# Patient Record
Sex: Female | Born: 1975 | Hispanic: Yes | Marital: Single | Smoking: Current every day smoker
Health system: Southern US, Community
[De-identification: ages and names within clinical notes are randomized; demographics above are authoritative.]

## PROBLEM LIST (undated history)

## (undated) DIAGNOSIS — I639 Cerebral infarction, unspecified: Secondary | ICD-10-CM

## (undated) DIAGNOSIS — F603 Borderline personality disorder: Secondary | ICD-10-CM

## (undated) DIAGNOSIS — F4310 Post-traumatic stress disorder, unspecified: Secondary | ICD-10-CM

## (undated) DIAGNOSIS — F1020 Alcohol dependence, uncomplicated: Secondary | ICD-10-CM

## (undated) DIAGNOSIS — F332 Major depressive disorder, recurrent severe without psychotic features: Secondary | ICD-10-CM

## (undated) DIAGNOSIS — F419 Anxiety disorder, unspecified: Secondary | ICD-10-CM

## (undated) DIAGNOSIS — F329 Major depressive disorder, single episode, unspecified: Secondary | ICD-10-CM

## (undated) DIAGNOSIS — F1120 Opioid dependence, uncomplicated: Secondary | ICD-10-CM

## (undated) DIAGNOSIS — F1320 Sedative, hypnotic or anxiolytic dependence, uncomplicated: Secondary | ICD-10-CM

## (undated) DIAGNOSIS — T71162A Asphyxiation due to hanging, intentional self-harm, initial encounter: Secondary | ICD-10-CM

## (undated) DIAGNOSIS — R569 Unspecified convulsions: Secondary | ICD-10-CM

## (undated) DIAGNOSIS — F32A Depression, unspecified: Secondary | ICD-10-CM

## (undated) HISTORY — PX: NO PAST SURGERIES: SHX2092

---

## 2012-09-20 ENCOUNTER — Emergency Department: Payer: Self-pay | Admitting: Emergency Medicine

## 2012-10-19 ENCOUNTER — Emergency Department: Payer: Self-pay | Admitting: Emergency Medicine

## 2012-10-19 LAB — COMPREHENSIVE METABOLIC PANEL
Albumin: 4.5 g/dL (ref 3.4–5.0)
Alkaline Phosphatase: 79 U/L (ref 50–136)
Calcium, Total: 9.4 mg/dL (ref 8.5–10.1)
Co2: 28 mmol/L (ref 21–32)
Creatinine: 0.78 mg/dL (ref 0.60–1.30)
Glucose: 95 mg/dL (ref 65–99)
Osmolality: 271 (ref 275–301)
SGOT(AST): 23 U/L (ref 15–37)
Sodium: 135 mmol/L — ABNORMAL LOW (ref 136–145)
Total Protein: 9 g/dL — ABNORMAL HIGH (ref 6.4–8.2)

## 2012-10-19 LAB — CBC
HCT: 46.4 % (ref 35.0–47.0)
HGB: 15.5 g/dL (ref 12.0–16.0)
MCH: 32.8 pg (ref 26.0–34.0)
RDW: 12.9 % (ref 11.5–14.5)
WBC: 8.5 10*3/uL (ref 3.6–11.0)

## 2012-10-19 LAB — URINALYSIS, COMPLETE
Glucose,UR: NEGATIVE mg/dL (ref 0–75)
Ketone: NEGATIVE
Protein: NEGATIVE
RBC,UR: <1 /HPF (ref 0–5)
Specific Gravity: 1.004 (ref 1.003–1.030)
Squamous Epithelial: 3
WBC UR: 1 /HPF (ref 0–5)

## 2012-10-19 LAB — LIPASE, BLOOD: Lipase: 138 U/L (ref 73–393)

## 2012-11-04 DIAGNOSIS — R52 Pain, unspecified: Secondary | ICD-10-CM | POA: Insufficient documentation

## 2012-11-04 DIAGNOSIS — IMO0002 Reserved for concepts with insufficient information to code with codable children: Secondary | ICD-10-CM | POA: Insufficient documentation

## 2012-12-21 ENCOUNTER — Emergency Department: Payer: Self-pay | Admitting: Emergency Medicine

## 2012-12-21 LAB — URINALYSIS, COMPLETE
Bacteria: NONE SEEN
Bilirubin,UR: NEGATIVE
Blood: NEGATIVE
Glucose,UR: NEGATIVE mg/dL (ref 0–75)
Ketone: NEGATIVE
Nitrite: NEGATIVE
Ph: 6 (ref 4.5–8.0)
Protein: NEGATIVE
RBC,UR: 1 /HPF (ref 0–5)
Squamous Epithelial: 6

## 2012-12-21 LAB — CBC
HCT: 40.4 % (ref 35.0–47.0)
HGB: 13.5 g/dL (ref 12.0–16.0)
MCH: 32.1 pg (ref 26.0–34.0)
MCV: 96 fL (ref 80–100)

## 2012-12-21 LAB — COMPREHENSIVE METABOLIC PANEL
Alkaline Phosphatase: 64 U/L (ref 50–136)
BUN: 12 mg/dL (ref 7–18)
Co2: 26 mmol/L (ref 21–32)
Creatinine: 0.59 mg/dL — ABNORMAL LOW (ref 0.60–1.30)
EGFR (African American): >60
EGFR (Non-African Amer.): >60
Glucose: 112 mg/dL — ABNORMAL HIGH (ref 65–99)
Osmolality: 269 (ref 275–301)
Potassium: 4 mmol/L (ref 3.5–5.1)
SGOT(AST): 41 U/L — ABNORMAL HIGH (ref 15–37)
SGPT (ALT): 34 U/L (ref 12–78)

## 2012-12-21 LAB — LIPASE, BLOOD: Lipase: 71 U/L — ABNORMAL LOW (ref 73–393)

## 2013-02-03 ENCOUNTER — Emergency Department: Payer: Self-pay | Admitting: Unknown Physician Specialty

## 2013-02-03 LAB — URINALYSIS, COMPLETE
Bilirubin,UR: NEGATIVE
Glucose,UR: NEGATIVE mg/dL (ref 0–75)
Ketone: NEGATIVE
Ph: 5 (ref 4.5–8.0)
RBC,UR: 1 /HPF (ref 0–5)
Specific Gravity: 1.018 (ref 1.003–1.030)

## 2013-02-03 LAB — COMPREHENSIVE METABOLIC PANEL
Albumin: 4.5 g/dL (ref 3.4–5.0)
Alkaline Phosphatase: 76 U/L (ref 50–136)
BUN: 8 mg/dL (ref 7–18)
Bilirubin,Total: 0.5 mg/dL (ref 0.2–1.0)
Calcium, Total: 9 mg/dL (ref 8.5–10.1)
Chloride: 110 mmol/L — ABNORMAL HIGH (ref 98–107)
Creatinine: 0.77 mg/dL (ref 0.60–1.30)
EGFR (Non-African Amer.): >60
Glucose: 117 mg/dL — ABNORMAL HIGH (ref 65–99)
Osmolality: 284 (ref 275–301)
SGOT(AST): 27 U/L (ref 15–37)
SGPT (ALT): 30 U/L (ref 12–78)
Sodium: 143 mmol/L (ref 136–145)
Total Protein: 8.1 g/dL (ref 6.4–8.2)

## 2013-02-03 LAB — CBC
HGB: 14.4 g/dL (ref 12.0–16.0)
MCH: 32.8 pg (ref 26.0–34.0)
MCHC: 34.3 g/dL (ref 32.0–36.0)
Platelet: 294 10*3/uL (ref 150–440)
RDW: 12.3 % (ref 11.5–14.5)
WBC: 9.5 10*3/uL (ref 3.6–11.0)

## 2013-04-01 ENCOUNTER — Emergency Department: Payer: Self-pay | Admitting: Emergency Medicine

## 2013-11-27 ENCOUNTER — Inpatient Hospital Stay: Payer: Self-pay | Admitting: Psychiatry

## 2013-11-27 LAB — DRUG SCREEN, URINE

## 2013-11-27 LAB — COMPREHENSIVE METABOLIC PANEL
ALT: 126 U/L — AB (ref 12–78)
ANION GAP: 9 (ref 7–16)
AST: 184 U/L — AB (ref 15–37)
Albumin: 3.6 g/dL (ref 3.4–5.0)
Alkaline Phosphatase: 66 U/L
BUN: 4 mg/dL — ABNORMAL LOW (ref 7–18)
Bilirubin,Total: 0.2 mg/dL (ref 0.2–1.0)
CO2: 25 mmol/L (ref 21–32)
CREATININE: 0.37 mg/dL — AB (ref 0.60–1.30)
Calcium, Total: 8.7 mg/dL (ref 8.5–10.1)
Chloride: 108 mmol/L — ABNORMAL HIGH (ref 98–107)
EGFR (African American): >60
EGFR (Non-African Amer.): >60
GLUCOSE: 116 mg/dL — AB (ref 65–99)
Osmolality: 281 (ref 275–301)
Potassium: 3.8 mmol/L (ref 3.5–5.1)
SODIUM: 142 mmol/L (ref 136–145)
TOTAL PROTEIN: 7.4 g/dL (ref 6.4–8.2)

## 2013-11-27 LAB — URINALYSIS, COMPLETE
BACTERIA: NONE SEEN
Bilirubin,UR: NEGATIVE
Blood: NEGATIVE
Glucose,UR: NEGATIVE mg/dL (ref 0–75)
Ketone: NEGATIVE
Leukocyte Esterase: NEGATIVE
NITRITE: NEGATIVE
Ph: 7 (ref 4.5–8.0)
Protein: NEGATIVE
RBC,UR: NONE SEEN /HPF (ref 0–5)
SPECIFIC GRAVITY: 1.001 (ref 1.003–1.030)
Squamous Epithelial: NONE SEEN
WBC UR: <1 /HPF (ref 0–5)

## 2013-11-27 LAB — ETHANOL
ETHANOL LVL: 279 mg/dL
Ethanol %: 0.279 % — ABNORMAL HIGH (ref 0.000–0.080)

## 2013-11-27 LAB — CBC
HCT: 38.7 % (ref 35.0–47.0)
HGB: 13 g/dL (ref 12.0–16.0)
MCH: 32.9 pg (ref 26.0–34.0)
MCHC: 33.5 g/dL (ref 32.0–36.0)
MCV: 98 fL (ref 80–100)
Platelet: 168 10*3/uL (ref 150–440)
RBC: 3.93 10*6/uL (ref 3.80–5.20)
RDW: 13 % (ref 11.5–14.5)
WBC: 6.2 10*3/uL (ref 3.6–11.0)

## 2013-11-27 LAB — PREGNANCY, URINE: Pregnancy Test, Urine: NEGATIVE m[IU]/mL

## 2013-12-19 ENCOUNTER — Emergency Department: Payer: Self-pay | Admitting: Emergency Medicine

## 2013-12-25 ENCOUNTER — Emergency Department: Payer: Self-pay | Admitting: Emergency Medicine

## 2013-12-25 LAB — ETHANOL
ETHANOL %: 0.206 % — AB (ref 0.000–0.080)
ETHANOL LVL: 206 mg/dL

## 2013-12-29 ENCOUNTER — Emergency Department: Payer: Self-pay | Admitting: Emergency Medicine

## 2013-12-29 LAB — COMPREHENSIVE METABOLIC PANEL
ANION GAP: 5 — AB (ref 7–16)
AST: 38 U/L — AB (ref 15–37)
Albumin: 3.6 g/dL (ref 3.4–5.0)
Alkaline Phosphatase: 55 U/L
BUN: 5 mg/dL — AB (ref 7–18)
Bilirubin,Total: 0.3 mg/dL (ref 0.2–1.0)
CHLORIDE: 115 mmol/L — AB (ref 98–107)
Calcium, Total: 8.5 mg/dL (ref 8.5–10.1)
Co2: 24 mmol/L (ref 21–32)
Creatinine: 0.81 mg/dL (ref 0.60–1.30)
EGFR (African American): >60
GLUCOSE: 75 mg/dL (ref 65–99)
Osmolality: 283 (ref 275–301)
Potassium: 4.4 mmol/L (ref 3.5–5.1)
SGPT (ALT): 30 U/L (ref 12–78)
SODIUM: 144 mmol/L (ref 136–145)
Total Protein: 7.1 g/dL (ref 6.4–8.2)

## 2013-12-29 LAB — ETHANOL: Ethanol: <3 mg/dL

## 2013-12-29 LAB — LITHIUM LEVEL: LITHIUM: 0.8 mmol/L

## 2013-12-29 LAB — SALICYLATE LEVEL: Salicylates, Serum: 17.2 mg/dL — ABNORMAL HIGH

## 2013-12-29 LAB — ACETAMINOPHEN LEVEL

## 2013-12-29 LAB — TSH: THYROID STIMULATING HORM: 1.24 u[IU]/mL

## 2013-12-30 LAB — CBC
HCT: 40.2 % (ref 35.0–47.0)
HGB: 13.5 g/dL (ref 12.0–16.0)
MCH: 34.1 pg — AB (ref 26.0–34.0)
MCHC: 33.6 g/dL (ref 32.0–36.0)
MCV: 101 fL — ABNORMAL HIGH (ref 80–100)
Platelet: 246 10*3/uL (ref 150–440)
RBC: 3.96 10*6/uL (ref 3.80–5.20)
RDW: 12.8 % (ref 11.5–14.5)
WBC: 7.9 10*3/uL (ref 3.6–11.0)

## 2013-12-30 LAB — SALICYLATE LEVEL: Salicylates, Serum: 15.8 mg/dL — ABNORMAL HIGH

## 2014-01-21 ENCOUNTER — Emergency Department: Payer: Self-pay | Admitting: Emergency Medicine

## 2014-01-21 LAB — COMPREHENSIVE METABOLIC PANEL
ALT: 52 U/L (ref 12–78)
ANION GAP: 17 — AB (ref 7–16)
AST: 97 U/L — AB (ref 15–37)
Albumin: 4.3 g/dL (ref 3.4–5.0)
Alkaline Phosphatase: 75 U/L
BUN: 12 mg/dL (ref 7–18)
Bilirubin,Total: 0.7 mg/dL (ref 0.2–1.0)
CALCIUM: 8.7 mg/dL (ref 8.5–10.1)
CHLORIDE: 99 mmol/L (ref 98–107)
CO2: 20 mmol/L — AB (ref 21–32)
CREATININE: 0.95 mg/dL (ref 0.60–1.30)
EGFR (African American): >60
EGFR (Non-African Amer.): >60
GLUCOSE: 137 mg/dL — AB (ref 65–99)
Osmolality: 274 (ref 275–301)
Potassium: 3.6 mmol/L (ref 3.5–5.1)
Sodium: 136 mmol/L (ref 136–145)
Total Protein: 9 g/dL — ABNORMAL HIGH (ref 6.4–8.2)

## 2014-01-21 LAB — CBC WITH DIFFERENTIAL/PLATELET
Basophil #: 0.1 10*3/uL (ref 0.0–0.1)
Basophil %: 1 %
Eosinophil #: 0 10*3/uL (ref 0.0–0.7)
Eosinophil %: 0.3 %
HCT: 49.5 % — ABNORMAL HIGH (ref 35.0–47.0)
HGB: 16.5 g/dL — ABNORMAL HIGH (ref 12.0–16.0)
Lymphocyte #: 2.1 10*3/uL (ref 1.0–3.6)
Lymphocyte %: 24.5 %
MCH: 33.3 pg (ref 26.0–34.0)
MCHC: 33.3 g/dL (ref 32.0–36.0)
MCV: 100 fL (ref 80–100)
MONO ABS: 0.5 x10 3/mm (ref 0.2–0.9)
Monocyte %: 6.2 %
NEUTROS ABS: 5.9 10*3/uL (ref 1.4–6.5)
Neutrophil %: 68 %
Platelet: 268 10*3/uL (ref 150–440)
RBC: 4.95 10*6/uL (ref 3.80–5.20)
RDW: 12.6 % (ref 11.5–14.5)
WBC: 8.7 10*3/uL (ref 3.6–11.0)

## 2014-01-21 LAB — LIPASE, BLOOD: LIPASE: 196 U/L (ref 73–393)

## 2014-02-24 ENCOUNTER — Emergency Department: Payer: Self-pay | Admitting: Emergency Medicine

## 2014-02-25 LAB — URINALYSIS, COMPLETE
BACTERIA: NONE SEEN
Bilirubin,UR: NEGATIVE
GLUCOSE, UR: NEGATIVE mg/dL (ref 0–75)
Ketone: NEGATIVE
Leukocyte Esterase: NEGATIVE
NITRITE: NEGATIVE
PROTEIN: NEGATIVE
Ph: 6 (ref 4.5–8.0)
RBC,UR: NONE SEEN /HPF (ref 0–5)
Specific Gravity: 1.002 (ref 1.003–1.030)
Squamous Epithelial: 1
WBC UR: NONE SEEN /HPF (ref 0–5)

## 2014-02-25 LAB — CBC WITH DIFFERENTIAL/PLATELET
BASOS PCT: 0.8 %
Basophil #: 0.1 10*3/uL (ref 0.0–0.1)
Eosinophil #: 0.2 10*3/uL (ref 0.0–0.7)
Eosinophil %: 2 %
HCT: 42.7 % (ref 35.0–47.0)
HGB: 14 g/dL (ref 12.0–16.0)
LYMPHS ABS: 3.6 10*3/uL (ref 1.0–3.6)
Lymphocyte %: 37.9 %
MCH: 33.7 pg (ref 26.0–34.0)
MCHC: 32.8 g/dL (ref 32.0–36.0)
MCV: 103 fL — ABNORMAL HIGH (ref 80–100)
Monocyte #: 0.7 x10 3/mm (ref 0.2–0.9)
Monocyte %: 7.5 %
NEUTROS ABS: 4.9 10*3/uL (ref 1.4–6.5)
Neutrophil %: 51.8 %
Platelet: 278 10*3/uL (ref 150–440)
RBC: 4.16 10*6/uL (ref 3.80–5.20)
RDW: 12.4 % (ref 11.5–14.5)
WBC: 9.4 10*3/uL (ref 3.6–11.0)

## 2014-02-25 LAB — PREGNANCY, URINE: PREGNANCY TEST, URINE: NEGATIVE m[IU]/mL

## 2014-02-25 LAB — BASIC METABOLIC PANEL
Anion Gap: 10 (ref 7–16)
BUN: 10 mg/dL (ref 7–18)
CALCIUM: 8.8 mg/dL (ref 8.5–10.1)
CREATININE: 0.6 mg/dL (ref 0.60–1.30)
Chloride: 108 mmol/L — ABNORMAL HIGH (ref 98–107)
Co2: 22 mmol/L (ref 21–32)
EGFR (African American): >60
Glucose: 90 mg/dL (ref 65–99)
Osmolality: 278 (ref 275–301)
Potassium: 3.9 mmol/L (ref 3.5–5.1)
SODIUM: 140 mmol/L (ref 136–145)

## 2014-02-25 LAB — DRUG SCREEN, URINE
AMPHETAMINES, UR SCREEN: NEGATIVE (ref ?–1000)
BENZODIAZEPINE, UR SCRN: NEGATIVE (ref ?–200)
Barbiturates, Ur Screen: NEGATIVE (ref ?–200)
Cannabinoid 50 Ng, Ur ~~LOC~~: NEGATIVE (ref ?–50)
Cocaine Metabolite,Ur ~~LOC~~: NEGATIVE (ref ?–300)
MDMA (ECSTASY) UR SCREEN: NEGATIVE (ref ?–500)
METHADONE, UR SCREEN: NEGATIVE (ref ?–300)
OPIATE, UR SCREEN: NEGATIVE (ref ?–300)
Phencyclidine (PCP) Ur S: NEGATIVE (ref ?–25)
TRICYCLIC, UR SCREEN: NEGATIVE (ref ?–1000)

## 2014-02-25 LAB — ETHANOL
Ethanol %: 0.23 % — ABNORMAL HIGH (ref 0.000–0.080)
Ethanol: 230 mg/dL

## 2014-03-19 ENCOUNTER — Emergency Department: Payer: Self-pay | Admitting: Emergency Medicine

## 2014-03-19 LAB — URINALYSIS, COMPLETE
BACTERIA: NONE SEEN
Bilirubin,UR: NEGATIVE
Blood: NEGATIVE
Glucose,UR: NEGATIVE mg/dL (ref 0–75)
Ketone: NEGATIVE
Leukocyte Esterase: NEGATIVE
NITRITE: NEGATIVE
PH: 7 (ref 4.5–8.0)
Protein: NEGATIVE
RBC,UR: 4 /HPF (ref 0–5)
Specific Gravity: 1.002 (ref 1.003–1.030)
WBC UR: 1 /HPF (ref 0–5)

## 2014-03-19 LAB — DRUG SCREEN, URINE
AMPHETAMINES, UR SCREEN: NEGATIVE (ref ?–1000)
BARBITURATES, UR SCREEN: NEGATIVE (ref ?–200)
Benzodiazepine, Ur Scrn: NEGATIVE (ref ?–200)
CANNABINOID 50 NG, UR ~~LOC~~: NEGATIVE (ref ?–50)
COCAINE METABOLITE, UR ~~LOC~~: NEGATIVE (ref ?–300)
MDMA (Ecstasy)Ur Screen: NEGATIVE (ref ?–500)
Methadone, Ur Screen: NEGATIVE (ref ?–300)
Opiate, Ur Screen: NEGATIVE (ref ?–300)
Phencyclidine (PCP) Ur S: NEGATIVE (ref ?–25)
TRICYCLIC, UR SCREEN: NEGATIVE (ref ?–1000)

## 2014-03-20 LAB — COMPREHENSIVE METABOLIC PANEL
ALBUMIN: 3.4 g/dL (ref 3.4–5.0)
Alkaline Phosphatase: 42 U/L — ABNORMAL LOW
Anion Gap: 8 (ref 7–16)
BUN: 12 mg/dL (ref 7–18)
Bilirubin,Total: 0.2 mg/dL (ref 0.2–1.0)
CHLORIDE: 107 mmol/L (ref 98–107)
Calcium, Total: 8.6 mg/dL (ref 8.5–10.1)
Co2: 22 mmol/L (ref 21–32)
Creatinine: 0.73 mg/dL (ref 0.60–1.30)
Glucose: 110 mg/dL — ABNORMAL HIGH (ref 65–99)
OSMOLALITY: 274 (ref 275–301)
POTASSIUM: 3 mmol/L — AB (ref 3.5–5.1)
SGOT(AST): 30 U/L (ref 15–37)
SGPT (ALT): 24 U/L
SODIUM: 137 mmol/L (ref 136–145)
Total Protein: 7.6 g/dL (ref 6.4–8.2)

## 2014-03-20 LAB — LITHIUM LEVEL
Lithium: 2 mmol/L
Lithium: 1.56 mmol/L

## 2014-03-20 LAB — CBC
HCT: 38.7 % (ref 35.0–47.0)
HGB: 12.7 g/dL (ref 12.0–16.0)
MCH: 33.2 pg (ref 26.0–34.0)
MCHC: 32.9 g/dL (ref 32.0–36.0)
MCV: 101 fL — AB (ref 80–100)
PLATELETS: 250 10*3/uL (ref 150–440)
RBC: 3.83 10*6/uL (ref 3.80–5.20)
RDW: 12.5 % (ref 11.5–14.5)
WBC: 11.8 10*3/uL — ABNORMAL HIGH (ref 3.6–11.0)

## 2014-03-20 LAB — ACETAMINOPHEN LEVEL: Acetaminophen: <2 ug/mL

## 2014-03-20 LAB — TSH: Thyroid Stimulating Horm: 4.6 u[IU]/mL — ABNORMAL HIGH

## 2014-03-20 LAB — ETHANOL
Ethanol %: 0.276 % — ABNORMAL HIGH (ref 0.000–0.080)
Ethanol: 276 mg/dL

## 2014-03-20 LAB — SALICYLATE LEVEL: SALICYLATES, SERUM: 1.8 mg/dL

## 2014-06-01 ENCOUNTER — Emergency Department: Payer: Self-pay | Admitting: Emergency Medicine

## 2014-06-01 LAB — COMPREHENSIVE METABOLIC PANEL
ALT: 40 U/L
ANION GAP: 9 (ref 7–16)
AST: 25 U/L (ref 15–37)
Albumin: 3.9 g/dL (ref 3.4–5.0)
Alkaline Phosphatase: 54 U/L
BUN: 3 mg/dL — ABNORMAL LOW (ref 7–18)
Bilirubin,Total: 0.2 mg/dL (ref 0.2–1.0)
CHLORIDE: 109 mmol/L — AB (ref 98–107)
CO2: 25 mmol/L (ref 21–32)
CREATININE: 0.62 mg/dL (ref 0.60–1.30)
Calcium, Total: 8.2 mg/dL — ABNORMAL LOW (ref 8.5–10.1)
EGFR (Non-African Amer.): >60
Glucose: 118 mg/dL — ABNORMAL HIGH (ref 65–99)
OSMOLALITY: 283 (ref 275–301)
Potassium: 3.5 mmol/L (ref 3.5–5.1)
Sodium: 143 mmol/L (ref 136–145)
Total Protein: 7.6 g/dL (ref 6.4–8.2)

## 2014-06-01 LAB — CBC
HCT: 40.7 % (ref 35.0–47.0)
HGB: 13.6 g/dL (ref 12.0–16.0)
MCH: 33.6 pg (ref 26.0–34.0)
MCHC: 33.5 g/dL (ref 32.0–36.0)
MCV: 100 fL (ref 80–100)
Platelet: 271 10*3/uL (ref 150–440)
RBC: 4.06 10*6/uL (ref 3.80–5.20)
RDW: 12.2 % (ref 11.5–14.5)
WBC: 11.4 10*3/uL — AB (ref 3.6–11.0)

## 2014-06-01 LAB — DRUG SCREEN, URINE

## 2014-06-01 LAB — ACETAMINOPHEN LEVEL: Acetaminophen: <2 ug/mL

## 2014-06-01 LAB — URINALYSIS, COMPLETE
BILIRUBIN, UR: NEGATIVE
Blood: NEGATIVE
GLUCOSE, UR: NEGATIVE mg/dL (ref 0–75)
Ketone: NEGATIVE
Leukocyte Esterase: NEGATIVE
Nitrite: NEGATIVE
Ph: 6 (ref 4.5–8.0)
Protein: NEGATIVE
Specific Gravity: 1.003 (ref 1.003–1.030)
WBC UR: 1 /HPF (ref 0–5)

## 2014-06-01 LAB — ETHANOL: ETHANOL LVL: 240 mg/dL

## 2014-06-01 LAB — SALICYLATE LEVEL: Salicylates, Serum: <1.7 mg/dL

## 2014-07-07 ENCOUNTER — Inpatient Hospital Stay: Payer: Self-pay | Admitting: Internal Medicine

## 2014-07-07 LAB — CBC WITH DIFFERENTIAL/PLATELET
BASOS ABS: 0.1 10*3/uL (ref 0.0–0.1)
Basophil %: 0.6 %
EOS ABS: 0 10*3/uL (ref 0.0–0.7)
EOS PCT: 0 %
HCT: 42.4 % (ref 35.0–47.0)
HGB: 14.1 g/dL (ref 12.0–16.0)
Lymphocyte #: 0.8 10*3/uL — ABNORMAL LOW (ref 1.0–3.6)
Lymphocyte %: 6.4 %
MCH: 32.6 pg (ref 26.0–34.0)
MCHC: 33.2 g/dL (ref 32.0–36.0)
MCV: 98 fL (ref 80–100)
Monocyte #: 0.4 x10 3/mm (ref 0.2–0.9)
Monocyte %: 3.3 %
Neutrophil #: 10.9 10*3/uL — ABNORMAL HIGH (ref 1.4–6.5)
Neutrophil %: 89.7 %
Platelet: 306 10*3/uL (ref 150–440)
RBC: 4.31 10*6/uL (ref 3.80–5.20)
RDW: 12.2 % (ref 11.5–14.5)
WBC: 12.2 10*3/uL — ABNORMAL HIGH (ref 3.6–11.0)

## 2014-07-07 LAB — COMPREHENSIVE METABOLIC PANEL
ALBUMIN: 4.2 g/dL (ref 3.4–5.0)
AST: 21 U/L (ref 15–37)
Alkaline Phosphatase: 65 U/L
Anion Gap: 13 (ref 7–16)
BILIRUBIN TOTAL: 0.9 mg/dL (ref 0.2–1.0)
BUN: 11 mg/dL (ref 7–18)
CALCIUM: 8.3 mg/dL — AB (ref 8.5–10.1)
CO2: 25 mmol/L (ref 21–32)
Chloride: 101 mmol/L (ref 98–107)
Creatinine: 0.85 mg/dL (ref 0.60–1.30)
EGFR (African American): >60
EGFR (Non-African Amer.): >60
GLUCOSE: 130 mg/dL — AB (ref 65–99)
OSMOLALITY: 279 (ref 275–301)
Potassium: 3 mmol/L — ABNORMAL LOW (ref 3.5–5.1)
SGPT (ALT): 28 U/L
Sodium: 139 mmol/L (ref 136–145)
Total Protein: 8 g/dL (ref 6.4–8.2)

## 2014-07-07 LAB — HEMOGLOBIN: HGB: 13.8 g/dL (ref 12.0–16.0)

## 2014-07-07 LAB — LIPASE, BLOOD: Lipase: 68 U/L — ABNORMAL LOW (ref 73–393)

## 2014-07-07 LAB — MAGNESIUM: Magnesium: 1.5 mg/dL — ABNORMAL LOW

## 2014-07-07 LAB — HEMOGLOBIN A1C: Hemoglobin A1C: 5.4 % (ref 4.2–6.3)

## 2014-07-07 LAB — TROPONIN I

## 2014-07-08 LAB — COMPREHENSIVE METABOLIC PANEL
ALBUMIN: 3.3 g/dL — AB (ref 3.4–5.0)
ALK PHOS: 59 U/L
Anion Gap: 9 (ref 7–16)
BUN: 6 mg/dL — AB (ref 7–18)
Bilirubin,Total: 0.7 mg/dL (ref 0.2–1.0)
CALCIUM: 7.7 mg/dL — AB (ref 8.5–10.1)
Chloride: 105 mmol/L (ref 98–107)
Co2: 24 mmol/L (ref 21–32)
Creatinine: 0.67 mg/dL (ref 0.60–1.30)
EGFR (African American): >60
Glucose: 175 mg/dL — ABNORMAL HIGH (ref 65–99)
OSMOLALITY: 278 (ref 275–301)
Potassium: 3.5 mmol/L (ref 3.5–5.1)
SGOT(AST): 24 U/L (ref 15–37)
SGPT (ALT): 22 U/L
SODIUM: 138 mmol/L (ref 136–145)
TOTAL PROTEIN: 7.1 g/dL (ref 6.4–8.2)

## 2014-07-08 LAB — CBC WITH DIFFERENTIAL/PLATELET
BASOS PCT: 0.3 %
Basophil #: 0 10*3/uL (ref 0.0–0.1)
Eosinophil #: 0 10*3/uL (ref 0.0–0.7)
Eosinophil %: 0 %
HCT: 39.7 % (ref 35.0–47.0)
HGB: 13 g/dL (ref 12.0–16.0)
LYMPHS PCT: 10.6 %
Lymphocyte #: 1.4 10*3/uL (ref 1.0–3.6)
MCH: 32.5 pg (ref 26.0–34.0)
MCHC: 32.7 g/dL (ref 32.0–36.0)
MCV: 100 fL (ref 80–100)
MONO ABS: 0.8 x10 3/mm (ref 0.2–0.9)
Monocyte %: 5.8 %
Neutrophil #: 11.1 10*3/uL — ABNORMAL HIGH (ref 1.4–6.5)
Neutrophil %: 83.3 %
Platelet: 285 10*3/uL (ref 150–440)
RBC: 3.99 10*6/uL (ref 3.80–5.20)
RDW: 12.1 % (ref 11.5–14.5)
WBC: 13.4 10*3/uL — ABNORMAL HIGH (ref 3.6–11.0)

## 2014-07-08 LAB — URINALYSIS, COMPLETE
BLOOD: NEGATIVE
Bacteria: NONE SEEN
Bilirubin,UR: NEGATIVE
Leukocyte Esterase: NEGATIVE
NITRITE: NEGATIVE
Ph: 6 (ref 4.5–8.0)
Protein: 30
Specific Gravity: 1.022 (ref 1.003–1.030)
Squamous Epithelial: 3
WBC UR: 2 /HPF (ref 0–5)

## 2014-07-08 LAB — TROPONIN I

## 2014-07-08 LAB — MAGNESIUM: Magnesium: 2.9 mg/dL — ABNORMAL HIGH

## 2014-07-08 LAB — HEMOGLOBIN: HGB: 13.5 g/dL (ref 12.0–16.0)

## 2014-07-10 ENCOUNTER — Emergency Department: Payer: Self-pay | Admitting: Emergency Medicine

## 2014-07-10 LAB — CBC WITH DIFFERENTIAL/PLATELET
Basophil #: 0.1 10*3/uL (ref 0.0–0.1)
Basophil %: 0.6 %
EOS PCT: 0.1 %
Eosinophil #: 0 10*3/uL (ref 0.0–0.7)
HCT: 46.1 % (ref 35.0–47.0)
HGB: 15.4 g/dL (ref 12.0–16.0)
Lymphocyte #: 1.5 10*3/uL (ref 1.0–3.6)
Lymphocyte %: 11.8 %
MCH: 32.3 pg (ref 26.0–34.0)
MCHC: 33.4 g/dL (ref 32.0–36.0)
MCV: 97 fL (ref 80–100)
Monocyte #: 0.6 x10 3/mm (ref 0.2–0.9)
Monocyte %: 4.6 %
NEUTROS PCT: 82.9 %
Neutrophil #: 10.5 10*3/uL — ABNORMAL HIGH (ref 1.4–6.5)
Platelet: 308 10*3/uL (ref 150–440)
RBC: 4.76 10*6/uL (ref 3.80–5.20)
RDW: 12.4 % (ref 11.5–14.5)
WBC: 12.7 10*3/uL — AB (ref 3.6–11.0)

## 2014-07-10 LAB — COMPREHENSIVE METABOLIC PANEL
ALK PHOS: 56 U/L
ALT: 37 U/L
ANION GAP: 9 (ref 7–16)
Albumin: 4.2 g/dL (ref 3.4–5.0)
BUN: 13 mg/dL (ref 7–18)
Bilirubin,Total: 0.6 mg/dL (ref 0.2–1.0)
CALCIUM: 9.1 mg/dL (ref 8.5–10.1)
CHLORIDE: 101 mmol/L (ref 98–107)
CO2: 27 mmol/L (ref 21–32)
Creatinine: 0.74 mg/dL (ref 0.60–1.30)
EGFR (African American): >60
EGFR (Non-African Amer.): >60
Glucose: 120 mg/dL — ABNORMAL HIGH (ref 65–99)
OSMOLALITY: 275 (ref 275–301)
Potassium: 2.8 mmol/L — ABNORMAL LOW (ref 3.5–5.1)
SGOT(AST): 37 U/L (ref 15–37)
SODIUM: 137 mmol/L (ref 136–145)
TOTAL PROTEIN: 8.2 g/dL (ref 6.4–8.2)

## 2014-07-10 LAB — LIPASE, BLOOD: LIPASE: 236 U/L (ref 73–393)

## 2014-10-21 IMAGING — CR DG HAND COMPLETE 3+V*L*
1 series · 3 of 3 positions shown · non-contrast
Comparison: none

REASON FOR EXAM: pain / injury    flex 41
COMMENTS:

[Series 1: pa · 0.17mm/px · 3 of 3 slices shown]
[im 1/3]
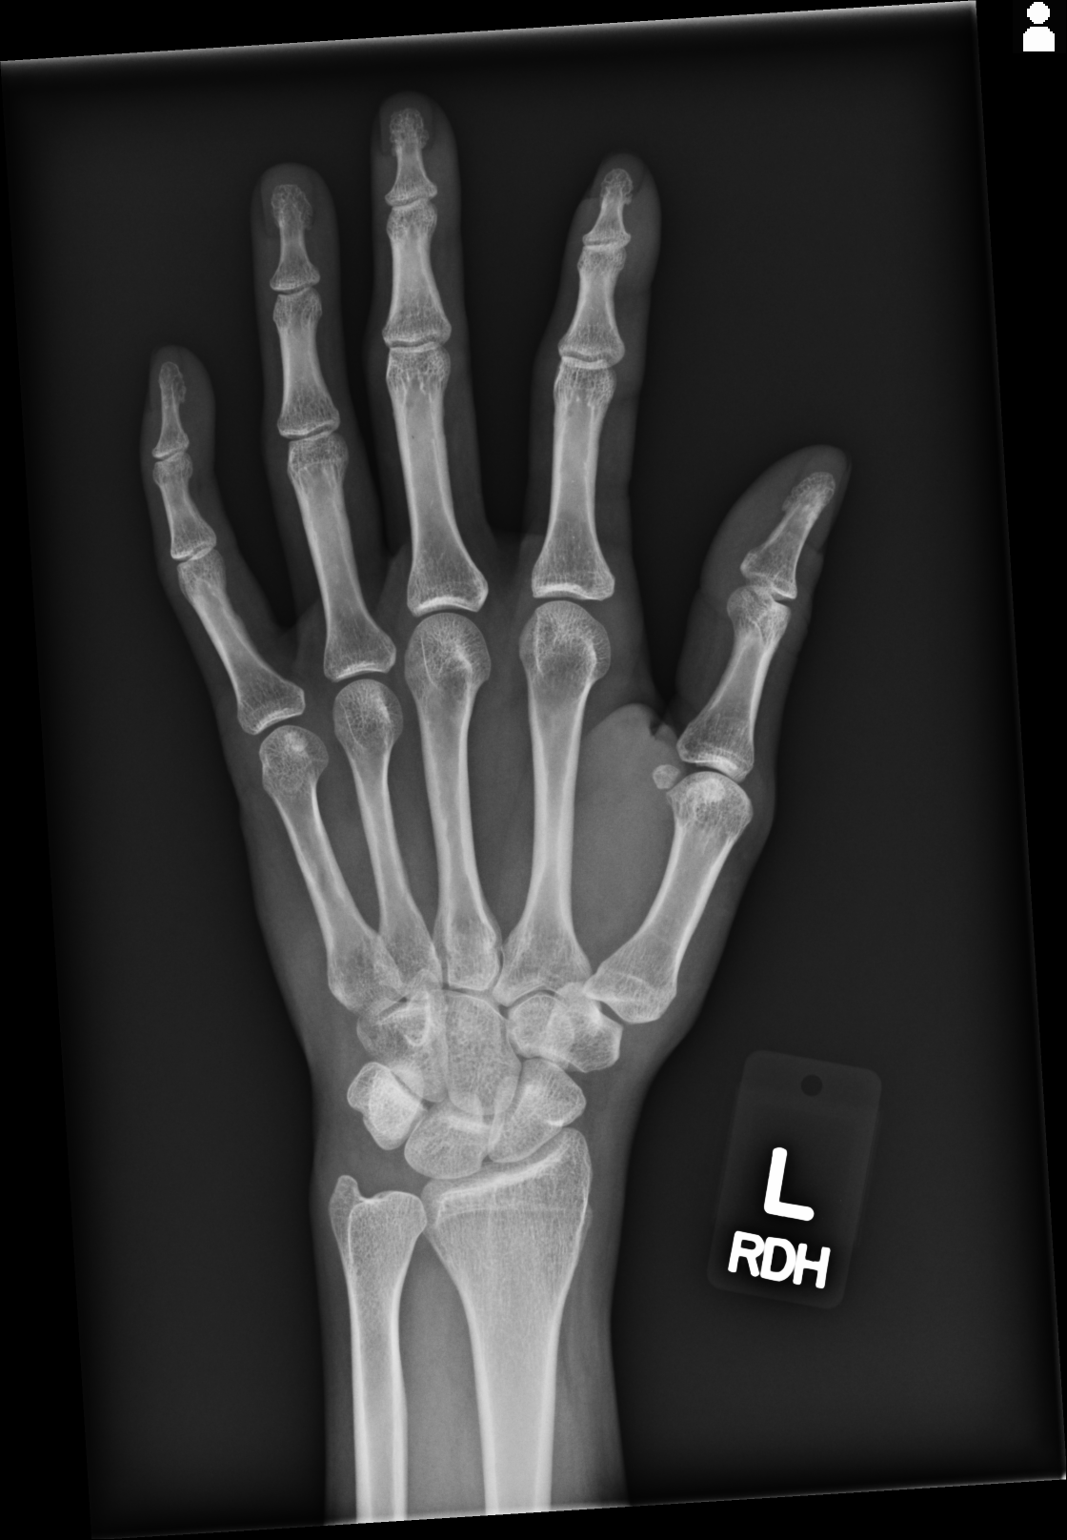
[im 2/3]
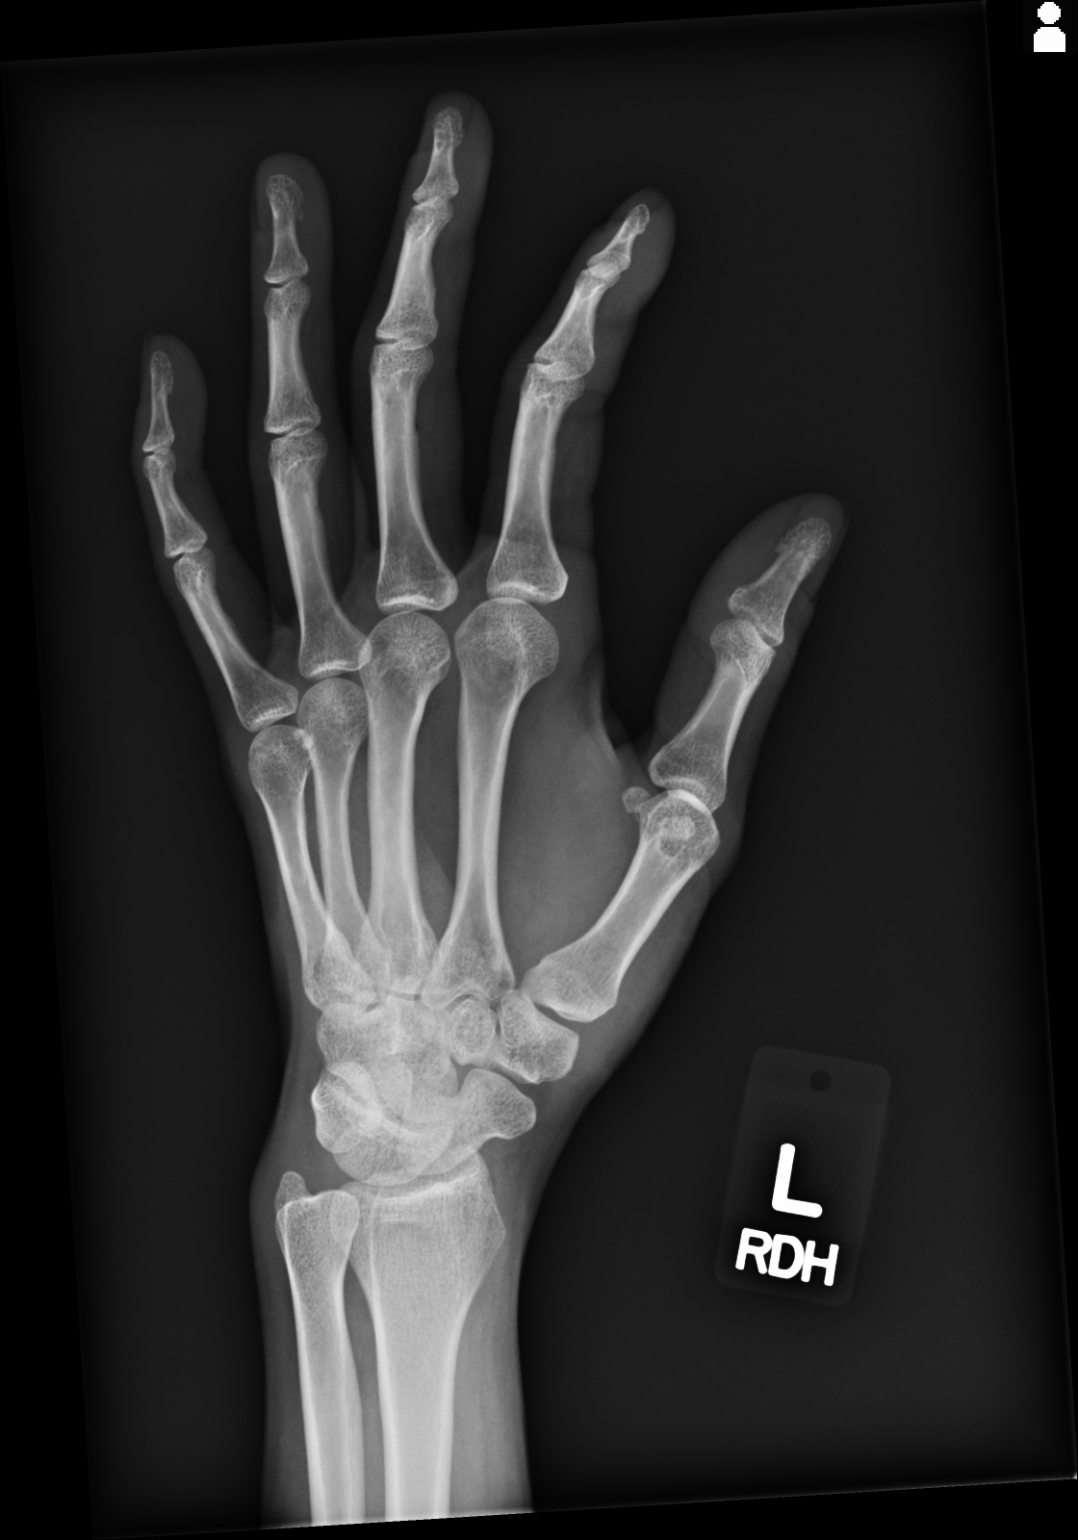
[im 3/3]
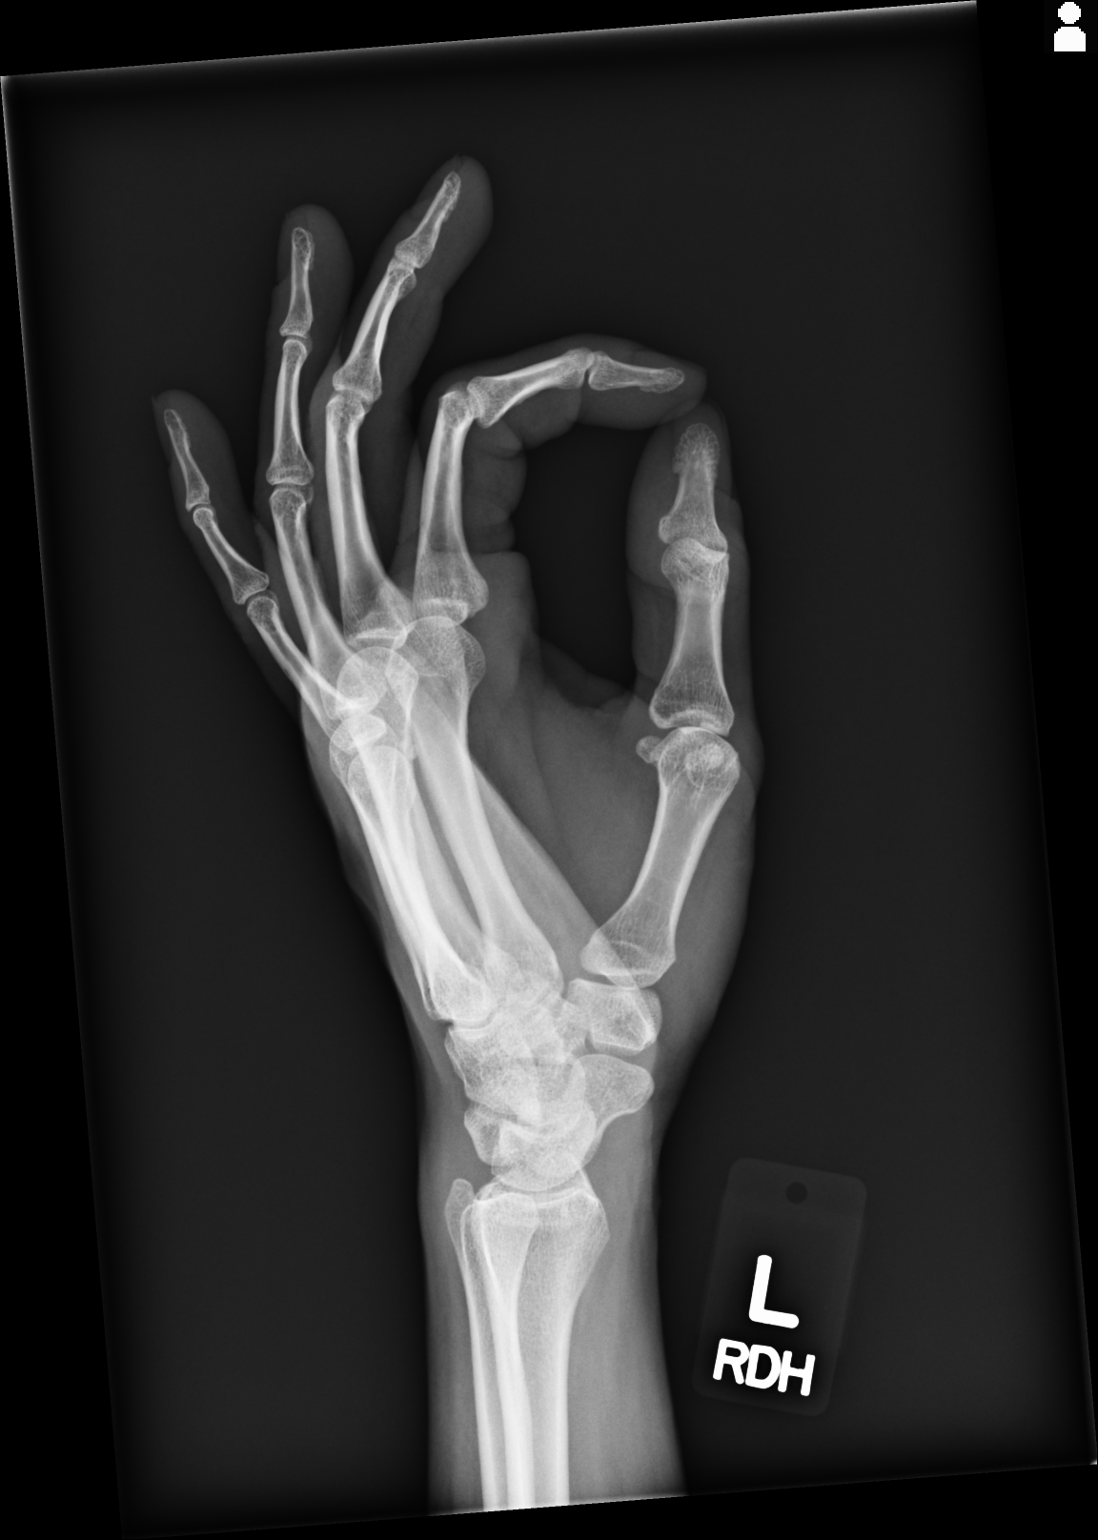

[3 of 3 positions shown; findings below may reference images not displayed]

PROCEDURE:     DXR - DXR HAND LT COMPLETE  W/OBLIQUES  - September 20, 2012  [DATE]

RESULT:     Three views of the left hand reveal the bones to be adequately
mineralized. There is no evidence of an acute or healing fracture. There is
no dislocation. Specific attention to the index finger and thumb reveals no
acute abnormalities. The soft tissues are normal in appearance.
IMPRESSION: There is no evidence of an acute or healing fracture of the
left hand. No significant degenerative change is demonstrated.

[REDACTED]

## 2014-10-30 LAB — DRUG SCREEN, URINE
Amphetamines, Ur Screen: NEGATIVE
BARBITURATES, UR SCREEN: NEGATIVE
Benzodiazepine, Ur Scrn: POSITIVE
Cannabinoid 50 Ng, Ur ~~LOC~~: NEGATIVE
Cocaine Metabolite,Ur ~~LOC~~: POSITIVE
MDMA (ECSTASY) UR SCREEN: NEGATIVE
Methadone, Ur Screen: NEGATIVE
OPIATE, UR SCREEN: NEGATIVE
Phencyclidine (PCP) Ur S: NEGATIVE
TRICYCLIC, UR SCREEN: POSITIVE

## 2014-10-30 LAB — PREGNANCY, URINE: Pregnancy Test, Urine: NEGATIVE m[IU]/mL

## 2014-10-30 LAB — COMPREHENSIVE METABOLIC PANEL
AST: 19 U/L
Albumin: 4.2 g/dL
Alkaline Phosphatase: 45 U/L
Anion Gap: 6 — ABNORMAL LOW (ref 7–16)
BUN: 6 mg/dL
Bilirubin,Total: 0.4 mg/dL
CALCIUM: 8.9 mg/dL
CHLORIDE: 109 mmol/L
CO2: 26 mmol/L
Creatinine: 0.53 mg/dL
EGFR (Non-African Amer.): >60
Glucose: 104 mg/dL — ABNORMAL HIGH
Potassium: 3.4 mmol/L — ABNORMAL LOW
SGPT (ALT): 18 U/L
Sodium: 141 mmol/L
TOTAL PROTEIN: 7.3 g/dL

## 2014-10-30 LAB — URINALYSIS, COMPLETE
Bacteria: NONE SEEN
Bilirubin,UR: NEGATIVE
Blood: NEGATIVE
GLUCOSE, UR: NEGATIVE mg/dL (ref 0–75)
Ketone: NEGATIVE
Leukocyte Esterase: NEGATIVE
Nitrite: NEGATIVE
PROTEIN: NEGATIVE
Ph: 6 (ref 4.5–8.0)
RBC,UR: <1 /HPF (ref 0–5)
Specific Gravity: 1.009 (ref 1.003–1.030)

## 2014-10-30 LAB — CBC
HCT: 39.7 % (ref 35.0–47.0)
HGB: 13.4 g/dL (ref 12.0–16.0)
MCH: 32.5 pg (ref 26.0–34.0)
MCHC: 33.8 g/dL (ref 32.0–36.0)
MCV: 96 fL (ref 80–100)
Platelet: 337 10*3/uL (ref 150–440)
RBC: 4.13 10*6/uL (ref 3.80–5.20)
RDW: 12.6 % (ref 11.5–14.5)
WBC: 10.1 10*3/uL (ref 3.6–11.0)

## 2014-10-30 LAB — ETHANOL: Ethanol: 258 mg/dL

## 2014-10-30 LAB — SALICYLATE LEVEL

## 2014-10-30 LAB — ACETAMINOPHEN LEVEL: Acetaminophen: <10 ug/mL

## 2014-10-30 LAB — LITHIUM LEVEL: LITHIUM: 0.5 mmol/L — AB (ref 0.60–1.20)

## 2014-11-02 ENCOUNTER — Inpatient Hospital Stay
Admit: 2014-11-02 | Disposition: A | Discharge status: Home / Self Care | Payer: Self-pay | Attending: Psychiatry | Admitting: Psychiatry

## 2014-11-02 LAB — TSH: THYROID STIMULATING HORM: 1.835 u[IU]/mL

## 2014-11-20 NOTE — Consult Note (Signed)
Brief Consult Note: Diagnosis: bipolar nos, alcohol intoxication.   Patient was seen by consultant.   Consult note dictated.   Discussed with Attending MD.   Comments: Psychiatry: PAtient seen and full evaluation done. Patient was intoxicated but , now sober, is back to baseline and can be safely discharged as she is not psychotic and denies SI. Has outpt followup.  Electronic Signatures: Audery Amellapacs, John T (MD)  (Signed [phone] [id number]-Nov-15 11:52)  Authored: Brief Consult Note   Last Updated: 03-Nov-15 11:52 by Audery Amellapacs, John T (MD)
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
## 2014-11-20 NOTE — Discharge Summary (Signed)
PATIENT NAME:  Sandy Mason, Sandy Mason MR#:  [id number] DATE OF BIRTH:  [birth year]  DATE OF ADMISSION:  11/27/2013 DATE OF DISCHARGE:  12/01/2013  HOSPITAL COURSE: See dictated history and physical for details of admission. A 39 year old woman with a history of alcohol abuse and mood instability came into the hospital with suicidal ideation. In the hospital, she initially displayed somewhat agitated, irritable, labile mood. Overall pattern appeared to be consistent with personality disorder with chronic depression, anxiety and alcohol abuse. She was treated for alcohol withdrawal and detoxed without difficulty in the hospital. No seizures. No delirium. Medication was recommended for treatment of her mood symptoms. She ultimately was treated with a combination of fluoxetine, lithium, BuSpar and prazosin. Medicine was tolerated well. She was denying suicidal ideation, denying psychotic symptoms and showing improved insight. The patient chose to be discharged back home but [facility] with outpatient treatment in the community. She has been counseled at length about the importance of maintaining sobriety and engaging in inappropriate therapy to stay stable.   DISCHARGE MEDICATIONS: Ibuprofen 600 mg every 6 hours as needed for pain, prazosin 2 mg at night, gabapentin 600 mg every 4 hours for anxiety, fluoxetine 20 mg once a day, trazodone 50 mg at night, lithium carbonate 300 mg at night, BuSpar 10 mg 2 times a day.   MENTAL STATUS EXAM AT DISCHARGE: Casually dressed, reasonably well-groomed woman, looks her stated age, cooperative with the interview. Eye contact good. Psychomotor activity normal. Speech normal rate, tone and volume. Affect is euthymic. Thoughts lucid. No evidence of loosening of associations or delusions. Denies auditory or visual hallucinations. Denies suicidal or homicidal ideation. Shows good insight and judgment. Normal intelligence. Alert and oriented x 4. Short- and long-term memory  intact.   LABORATORY RESULTS: On admission, chemistry panel showed elevated glucose 116, creatinine low at 37, ALT elevated at 126, AST elevated at 184. CBC was normal. Alcohol level 279. Drug screen negative. Urinalysis unremarkable. Pregnancy test negative.   DISPOSITION: Discharge home. Follow up with [facility] for outpatient treatment.   DIAGNOSIS, PRINCIPAL AND PRIMARY:  AXIS I: Major depression, recurrent, severe.   SECONDARY DIAGNOSES:  AXIS I: Alcohol dependence.  AXIS II: Borderline personality disorder with antisocial traits.  AXIS III: Status post a fall with mild injury, alcohol withdrawal, chronic musculoskeletal pain.  AXIS IV: Severe from dysfunctional living situation and chronic illness.  AXIS V: Functioning at time of discharge 50.    ____________________________ Audery AmelJohn T. Shannette Tabares, MD jtc:gb D: 12/22/2013 23:46:37 ET T: 12/22/2013 23:57:55 ET JOB#: [id number]  cc: Audery AmelJohn T. Dmani Mizer, MD, <Dictator> Audery AmelJOHN T Oriel Ojo MD ELECTRONICALLY SIGNED 12/31/2013 11:32
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
## 2014-11-20 NOTE — Discharge Summary (Signed)
PATIENT NAME:  Sandy Mason, Eustacia J MR#:  [id number] DATE OF BIRTH:  03-16-1976  DATE OF ADMISSION:  07/07/2014 DATE OF DISCHARGE:  07/08/2014  PRIMARY CARE PHYSICIAN: Nonlocal.  DISCHARGE DIAGNOSES: Intractable nausea, vomiting, hematemesis, alcohol abuse, tobacco abuse.   CONDITION: Stable.   CODE STATUS: Full code.   HOME MEDICATIONS: Nicotine patch 40 mg transdermal film once a day.   DIET: Regular diet.   ACTIVITY: As tolerated.   FOLLOWUP CARE: With PCP within 1-2 weeks. The patient needs smoking and alcohol cessation.   REASON FOR ADMISSION: Hematemesis.   HOSPITAL COURSE: The patient is a 39 year old African American female with a history of alcohol abuse, bipolar disorder, and numerous admissions for behavior medicine evaluation presented to the ED with complaints of intractable nausea, vomiting, and abdominal pain. In addition, the patient said that she had bloody vomitus, but denies any melena or bloody stool. For detailed history and physical examination, please refer to the admission note dictated by Dr. Winona LegatoVaickute. On admission day the patient's lipase was 68, WBC 12.12, hemoglobin 14.1, platelets 306,000. Urinalysis is negative.  1.  Intractable nausea and vomiting: After admission the patient has been treated with Zofran and Phenergan and her symptoms have much improved.  2.  Hematemesis: No etiology, possibly due to intractable nausea, vomiting. The patient has been treated with octreotide, PPI drip. Hemoglobin has been monitored every 6 hours, which is normal. The patient denies any more hematemesis, melena, or bloody stool.  3.  Alcohol abuse: The patient has been CIWA protocol. No signs of withdrawal.  4.  Tobacco abuse: Has been counseled by Dr. Winona LegatoVaickute and nicotine patch was given.   The patient has no complaints except mild nausea. Vital signs are stable. Hemoglobin is stable. Her magnesium was 1.5, after supplementing it was 2.9. The patient also has hypokalemia with  potassium 3.0, increased to 3.5 after treatment. The patient is clinically stable and will be discharged to home today. I discussed the patient's discharge plan with the patient, nurse, case manager.   TIME SPENT: About 37 minutes.   ____________________________ Shaune PollackQing Julionna Marczak, MD qc:bm D: 07/08/2014 12:08:34 ET T: 07/09/2014 01:10:32 ET JOB#: [id number]  cc: Shaune PollackQing Maksymilian Mabey, MD, <Dictator> Shaune PollackQING Zoey Bidwell MD ELECTRONICALLY SIGNED 07/09/2014 14:04
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
## 2014-11-20 NOTE — Consult Note (Signed)
Brief Consult Note: Diagnosis: alcohol intoxication resolved, chronic depression.   Patient was seen by consultant.   Consult note dictated.   Recommend further assessment or treatment.   Discussed with Attending MD.   Comments: Psychiatry: Patient seen. Note dictated. Reviewed with ER MD. Patient was intoxicated last night and made suicidal comments but is sober today and denies any SI and appears lucid and stable. No longer commitable. Has outpt treatment in place. DC from ER.  Electronic Signatures: Audery Amellapacs, John T (MD)  (Signed 30-Jul-15 17:51)  Authored: Brief Consult Note   Last Updated: 30-Jul-15 17:51 by Audery Amellapacs, John T (MD)
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
## 2014-11-24 NOTE — H&P (Signed)
PATIENT NAME:  Sandy Mason, Sandy Mason MR#:  [id number] DATE OF BIRTH:  [birth year]  PRIMARY CARE PHYSICIAN: None.    The patient is a 39 year old female with past medical history significant for history of alcohol abuse, also bipolar disorder, who has numerous admissions for behavioral medicine evaluation in the Emergency Room. Presents to the hospital with complaints of intractable nausea, vomiting. Apparently the patient went out to eat and drink to the restaurant on Monday, 2 days ago, and after that she started having severe cramping abdominal pain in lower abdomen, as well as between her shoulder blades and intractable nausea and vomiting. She has been vomiting now since Monday and was not able to eat or drink at all over the past few days. She presented to the Emergency Room where she was given 2 liters of IV fluids, 8 mg of Zofran, 25 mg of Phenergan, as well as 1 mg of Ativan with no significant improvement. Now she just came back from the bathroom where she told me that she had some amount of blood in her vomitus. In fact, it was not as much of vomiting itself of fluid, but blood.  She told me that it was approximately 2 ounces of bright red blood in vomitus. She denies any black or tarry-looking stools; however, admits of having some blood whenever she wipes, intermittently, chronically, especially when she has problems with her gastrointestinal tract.  PAST MEDICAL HISTORY: Significant for history of gastritis, peptic ulcer disease in her stomach, [place]. No esophageal varices reported, according to patient. Also, history of PTSD, bipolar disorder, alcohol, tobacco abuse, polysubstance abuse, history of suicide attempts in the past, and also borderline personality disorder according to the medical records. Also significant for chronic pain.    SOCIAL HISTORY: Lives with a roommate. Has not worked consistently.  Has had major financial problems. She has a young  daughter, 38 years old, but she has custody with daughter's father. The patient is single now.  Smokes up to 1 pack a day.  Has been smoking for 20 years. Drinks approximately 2 six-packs in 3-4 days. Drinks beer.  FAMILY HISTORY: Positive for mood problems, but no hypertension, diabetes, cancers, early coronary artery disease, or strokes.  In the past, she was on buspirone 10 mg twice daily, lithium carbonate 1 tablet once at bedtime, trazodone 50 mg p.o. at bedtime, fluoxetine 10 mg p.o. daily, gabapentin 600 mg every 4 hours as needed, prazosin 2 mg at night, and ibuprofen as needed. It is unclear if she is taking those medications since no medications reported here at present.   ALLERGIES: DEPAKOTE, HALDOL, KEFLEX, AS WELL AS CODEINE.   REVIEW OF SYSTEMS:  CONSTITUTIONAL:  Positive for nausea, vomiting, feeling feverish and chills. Treated over the past 2 days daily, fatigue and weak, pains in the abdomen, all over the abdomen, some sinus congestion, whenever she vomits. Some chest pains and burning sensation after she vomits. Also feeling presyncopal, having arrhythmias, palpitations. Admits to having hematemesis which just started now in the Emergency Room. Also achiness over her whole body. Denies any high fevers, weight loss or gain.  EYES: Denies any blurred vision, double vision, glaucoma, or cataracts. Denies any tinnitus, allergies, epistaxis, sinus pain, dentures, difficulty swallowing.  RESPIRATORY: Denies any cough, wheezes, asthma, or COPD.  CARDIOVASCULAR: Denies any orthopnea, edema, palpitations.  GASTROINTESTINAL: Denies any melena, constipation, change in bowel habits. GENITOURINARY: Denies dysuria, hematuria, frequency, or incontinence. ENDOCRINOLOGY: Denies any polydipsia, nocturia, thyroid  problems, heat or cold intolerance or thirst.  HEMATOLOGIC: Denies anemia, easy bruising, bleeding, or swollen glands.  SKIN: Denies any acne, rashes, lesions, or change in moles.   MUSCULOSKELETAL: Denies arthritis, cramps, swelling.  NEUROLOGICAL: Denies numbness, epilepsy, or tremor.  PSYCHIATRIC: Denies anxiety, insomnia, or depression.    PHYSICAL EXAMINATION:  VITAL SIGNS: On arrival to the hospital, temperature was 98.4, pulse was 87, respiration rate was 20, blood pressure 139/60, saturation was 100% on room air.  GENERAL: This is a well-nourished female in no significant distress, lying on the stretcher. HEENT: Her pupils are equal and reactive to light. Extraocular movements are intact.  No icterus or conjunctivitis. Has normal hearing. No pharyngeal erythema. Mucosa is moist. NECK: No masses, supple, nontender. Thyroid is not enlarged. No adenopathy. No JVD or carotid bruits bilaterally. Full range of motion.  LUNGS: Clear to auscultation in all fields. No rales, rhonchi, diminished breath sounds, or wheezing. No labored inspirations, increased effort, dullness to percussion, or overt respiratory distress.  CARDIOVASCULAR: S1, S2 appreciated.  Rhythm is regular.  PMI not lateralized. Chest is nontender to palpation,  1+ pedal pulses.   EXTREMITIES:  No lower extremity edema, calf tenderness, or cyanosis was noted. ABDOMEN: Soft, tender diffusely, mostly in the epigastric area, but also some discomfort in the suprapubic area and right and left lower quadrants, but no rebound or guarding was noted.  No hepatosplenomegaly or masses were noted.  Bowel sounds were present, though diminished. RECTAL: Deferred.  MUSCULOSKELETAL: Able to move all extremities. No cyanosis, degenerative joint disease, or kyphosis. Tenderness on percussion of her bilateral kidney areas. Gait not tested. SKIN: Did not reveal any rashes, lesions, erythema, nodularity, or induration. It was warm and dry to palpation. LYMPHATIC: No adenopathy in the cervical region.  NEUROLOGIC: Cranial nerves 2 through 12 were intact. Sensory intact.  No dysarthria or aphasia.  PSYCHIATRIC: The patient is alert,  oriented to time, person, and place, poorly cooperative. Memory is good. No significant confusion, agitation, or depression was noted.   LABORATORY DATA: Glucose of 130, potassium of 3.0, lipase was about 68, calcium was 8.3.  Liver enzymes were unremarkable. White blood cell count is elevated to 12.2, hemoglobin 14.1, platelet count was 306,000. Absolute neutrophil count is elevated at 10.9. Urinalysis was not performed.   RADIOLOGIC STUDIES: None.   ASSESSMENT AND PLAN: 1.  Intractable nausea and vomiting. Admit patient to medical floor. Continue her on intravenous  fluids, Zofran, Phenergan as well as proton pump inhibitors.  We will start the patient on clear liquids as soon as she is allowed by gastroenterology. 2.  Hematemesis in alcoholic patient per her report. Will start patient on Octreotide, proton pump inhibitor drip, and gastroenterology consultation will be requested. We will check hemoglobin every 6-8 hours.  3.  Hyperglycemic. Check hemoglobin A1c.  4.  Hypokalemia, supplementing intravenous.  Get magnesium level and supplement intravenous as well. 5.  Alcohol abuse, watch for withdrawal.  6.  Tobacco abuse. Discussed cessation for approximately 3 minutes. Nicotine   replacement therapy will be initiated.   TIME SPENT: 50 minutes.    ____________________________ Katharina Caper, MD rv:LT D: 07/07/2014 18:29:21 ET T: 07/07/2014 19:10:54 ET JOB#: [id number]  cc: Katharina Caper, MD, <Dictator> Favor Hackler MD ELECTRONICALLY SIGNED 08/10/2014 18:14
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
## 2014-11-28 NOTE — Consult Note (Signed)
History of Present Illness:  History of Present Illness The patient is a 39 year old Hispanic female who was recently d/c from [facility] presented for depression and suicidal ideations. She was seen in [facility]. She stated that she continues to feels depressed, hopless, helpess and having suicidal ideations and is unable to contract for safety. she wants tto be admitted for stabilization and safety.   Target Symptoms:  Depressive Interest Loss  Appetite Change  Sleep Change  Anhedonia  Suicidality   Capacity Recognizes the presence of illlness   PAST MEDICAL & SURGICAL HX:  Significant Events:   Anxiety:    seizures:    CVA:    insomnia:    anxiety:    depression:    PTSD:   CURRENT OUTPATIENT MEDICATIONS:  Home Medications: Medication Instructions Status  promethazine 25 mg oral tablet 1 tab(s) orally every 6 hours, As Needed - for Nausea, Vomiting Active  Phenadoz 25 mg rectal suppository 1 suppository(ies) rectal every 6 hours, As Needed - for Nausea, Vomiting Active  busPIRone 10 mg oral tablet 1 tab(s) orally 3 times a day Active  FLUoxetine 20 mg oral capsule 2 caps (40mg ) orally once a day for depression. Active  gabapentin 300 mg oral capsule 2 caps (600mg ) orally 4 times a day for anxiety. Active  lithium 300 mg oral capsule 1 cap(s) orally 2 times a day for mood stability. Active  prazosin 2 mg oral capsule 1 cap(s) orally once a day (at bedtime) for nightmares. Active  traZODone 50 mg oral tablet 1 tab(s) orally once a day (at bedtime) for sleep. Active   Mental Status Exam:  Mood Depressed   Affect Depressed   Thought Content Suicidal ideation   Attention Alert   Concentration Fair   Memory Intact   Fund of Knowledge Fair   Language Fair   Insight Fair   Reliabiity Fair   NURSING FLOWSHEETS:  Vital Signs/Nurse Notes-CM: ED Vital Sign Flow Sheet:   04-Apr-16 08:26  Temp Temperature 97.9  Temp Source oral  Pulse Pulse 67  Respirations  Respirations 20  SBP SBP 104  DBP DBP 50  Pulse Ox % Pulse Ox % 97  Pulse Ox Source Source Room Air  Pain Scale (0-10) Pain Scale (0-10) Scale:0   Assessment & Diagnosis: Axis I: MDD Severe.  Treatment Plan: Pt will be admitted [facility] for stabilization and safety Continue meds.  Electronic Signatures: Rhunette CroftFaheem, Jazon Jipson S (MD)  (Signed 04-Apr-16 12:29)  Authored: History of Present Illness, Target Symptoms, PAST MEDICAL & SURGICAL HX, CURRENT OUTPATIENT MEDICATIONS, Mental Status Exam, NURSING FLOWSHEETS, Assessment & Diagnosis, Treatment Plan   Last Updated: 04-Apr-16 12:29 by Rhunette CroftFaheem, Sissi Padia S (MD)
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
## 2014-12-17 ENCOUNTER — Encounter: Payer: Self-pay | Admitting: *Deleted

## 2014-12-17 ENCOUNTER — Observation Stay
Admission: EM | Admit: 2014-12-17 | Discharge: 2014-12-19 | Payer: No Typology Code available for payment source | Attending: Internal Medicine | Admitting: Internal Medicine

## 2014-12-17 DIAGNOSIS — IMO0001 Reserved for inherently not codable concepts without codable children: Secondary | ICD-10-CM

## 2014-12-17 DIAGNOSIS — R400 Somnolence: Secondary | ICD-10-CM | POA: Insufficient documentation

## 2014-12-17 DIAGNOSIS — Z79899 Other long term (current) drug therapy: Secondary | ICD-10-CM | POA: Insufficient documentation

## 2014-12-17 DIAGNOSIS — R000 Tachycardia, unspecified: Secondary | ICD-10-CM

## 2014-12-17 DIAGNOSIS — G629 Polyneuropathy, unspecified: Secondary | ICD-10-CM | POA: Insufficient documentation

## 2014-12-17 DIAGNOSIS — R45851 Suicidal ideations: Secondary | ICD-10-CM | POA: Insufficient documentation

## 2014-12-17 DIAGNOSIS — F1020 Alcohol dependence, uncomplicated: Secondary | ICD-10-CM

## 2014-12-17 DIAGNOSIS — Z8673 Personal history of transient ischemic attack (TIA), and cerebral infarction without residual deficits: Secondary | ICD-10-CM | POA: Insufficient documentation

## 2014-12-17 DIAGNOSIS — F332 Major depressive disorder, recurrent severe without psychotic features: Principal | ICD-10-CM | POA: Insufficient documentation

## 2014-12-17 DIAGNOSIS — E669 Obesity, unspecified: Secondary | ICD-10-CM | POA: Insufficient documentation

## 2014-12-17 DIAGNOSIS — F603 Borderline personality disorder: Secondary | ICD-10-CM | POA: Insufficient documentation

## 2014-12-17 DIAGNOSIS — F10239 Alcohol dependence with withdrawal, unspecified: Secondary | ICD-10-CM | POA: Insufficient documentation

## 2014-12-17 DIAGNOSIS — F17200 Nicotine dependence, unspecified, uncomplicated: Secondary | ICD-10-CM | POA: Insufficient documentation

## 2014-12-17 HISTORY — DX: Cerebral infarction, unspecified: I63.9

## 2014-12-17 HISTORY — DX: Unspecified convulsions: R56.9

## 2014-12-17 LAB — BASIC METABOLIC PANEL
Anion gap: 6 (ref 5–15)
BUN: 13 mg/dL (ref 6–20)
CO2: 24 mmol/L (ref 22–32)
Calcium: 8.6 mg/dL — ABNORMAL LOW (ref 8.9–10.3)
Chloride: 105 mmol/L (ref 101–111)
Creatinine, Ser: 0.73 mg/dL (ref 0.44–1.00)
GFR calc Af Amer: >60 mL/min (ref >60)
Glucose, Bld: 98 mg/dL (ref 65–99)
POTASSIUM: 4.1 mmol/L (ref 3.5–5.1)
SODIUM: 135 mmol/L (ref 135–145)

## 2014-12-17 LAB — COMPREHENSIVE METABOLIC PANEL
ALK PHOS: 54 U/L (ref 38–126)
ALT: 22 U/L (ref 14–54)
ANION GAP: 12 (ref 5–15)
AST: 28 U/L (ref 15–41)
Albumin: 4.5 g/dL (ref 3.5–5.0)
BUN: 14 mg/dL (ref 6–20)
CHLORIDE: 99 mmol/L — AB (ref 101–111)
CO2: 26 mmol/L (ref 22–32)
Calcium: 9.6 mg/dL (ref 8.9–10.3)
Creatinine, Ser: 0.72 mg/dL (ref 0.44–1.00)
GFR calc Af Amer: >60 mL/min (ref >60)
GFR calc non Af Amer: >60 mL/min (ref >60)
Glucose, Bld: 109 mg/dL — ABNORMAL HIGH (ref 65–99)
POTASSIUM: 3.7 mmol/L (ref 3.5–5.1)
SODIUM: 137 mmol/L (ref 135–145)
Total Bilirubin: 0.3 mg/dL (ref 0.3–1.2)
Total Protein: 8.3 g/dL — ABNORMAL HIGH (ref 6.5–8.1)

## 2014-12-17 LAB — TSH: TSH: 1.075 u[IU]/mL (ref 0.350–4.500)

## 2014-12-17 LAB — CBC
HCT: 39.6 % (ref 35.0–47.0)
HCT: 41.6 % (ref 35.0–47.0)
HEMOGLOBIN: 14.1 g/dL (ref 12.0–16.0)
Hemoglobin: 12.9 g/dL (ref 12.0–16.0)
MCH: 31.8 pg (ref 26.0–34.0)
MCH: 32.2 pg (ref 26.0–34.0)
MCHC: 33.9 g/dL (ref 32.0–36.0)
MCHC: 32.7 g/dL (ref 32.0–36.0)
MCV: 95.1 fL (ref 80.0–100.0)
MCV: 97.2 fL (ref 80.0–100.0)
Platelets: 288 10*3/uL (ref 150–440)
Platelets: 314 10*3/uL (ref 150–440)
RBC: 4.08 MIL/uL (ref 3.80–5.20)
RBC: 4.38 MIL/uL (ref 3.80–5.20)
RDW: 12.9 % (ref 11.5–14.5)
RDW: 13.2 % (ref 11.5–14.5)
WBC: 14.1 10*3/uL — AB (ref 3.6–11.0)
WBC: 10.7 10*3/uL (ref 3.6–11.0)

## 2014-12-17 LAB — URINALYSIS COMPLETE WITH MICROSCOPIC (ARMC ONLY)
Bacteria, UA: NONE SEEN
Bilirubin Urine: NEGATIVE
Glucose, UA: NEGATIVE mg/dL
KETONES UR: NEGATIVE mg/dL
Leukocytes, UA: NEGATIVE
Nitrite: NEGATIVE
Protein, ur: 30 mg/dL — AB
Specific Gravity, Urine: 1.035 — ABNORMAL HIGH (ref 1.005–1.030)
pH: 5 (ref 5.0–8.0)

## 2014-12-17 LAB — URINE DRUG SCREEN, QUALITATIVE (ARMC ONLY)
AMPHETAMINES, UR SCREEN: NOT DETECTED
Barbiturates, Ur Screen: NOT DETECTED
Benzodiazepine, Ur Scrn: POSITIVE — AB
COCAINE METABOLITE, UR ~~LOC~~: NOT DETECTED
Cannabinoid 50 Ng, Ur ~~LOC~~: NOT DETECTED
MDMA (Ecstasy)Ur Screen: NOT DETECTED
Methadone Scn, Ur: NOT DETECTED
Opiate, Ur Screen: POSITIVE — AB
Phencyclidine (PCP) Ur S: NOT DETECTED
TRICYCLIC, UR SCREEN: NOT DETECTED

## 2014-12-17 LAB — ETHANOL

## 2014-12-17 LAB — LITHIUM LEVEL: Lithium Lvl: <0.06 mmol/L — ABNORMAL LOW (ref 0.60–1.20)

## 2014-12-17 LAB — GLUCOSE, CAPILLARY: Glucose-Capillary: 99 mg/dL (ref 65–99)

## 2014-12-17 LAB — ACETAMINOPHEN LEVEL: Acetaminophen (Tylenol), Serum: 18 ug/mL (ref 10–30)

## 2014-12-17 LAB — SALICYLATE LEVEL

## 2014-12-17 LAB — PREGNANCY, URINE: Preg Test, Ur: NEGATIVE

## 2014-12-17 MED ORDER — ACETAMINOPHEN 325 MG PO TABS
650.0000 mg | ORAL_TABLET | Freq: Four times a day (QID) | ORAL | Status: DC | PRN
  Administered 2014-12-18: 650 mg via ORAL
  Filled 2014-12-17: qty 2

## 2014-12-17 MED ORDER — ONDANSETRON HCL 4 MG PO TABS: 4.0000 mg | ORAL_TABLET | Freq: Four times a day (QID) | ORAL | Status: DC | PRN

## 2014-12-17 MED ORDER — ENOXAPARIN SODIUM 40 MG/0.4ML ~~LOC~~ SOLN
40.0000 mg | SUBCUTANEOUS | Status: DC
Start: 2014-12-17 — End: 2014-12-19
  Administered 2014-12-17 – 2014-12-18 (×2): 40 mg via SUBCUTANEOUS
  Filled 2014-12-17 (×2): qty 0.4

## 2014-12-17 MED ORDER — ONDANSETRON HCL 4 MG/2ML IJ SOLN: 4.0000 mg | Freq: Four times a day (QID) | INTRAMUSCULAR | Status: DC | PRN

## 2014-12-17 MED ORDER — SODIUM CHLORIDE 0.9 % IV SOLN
Freq: Once | INTRAVENOUS | Status: AC
  Administered 2014-12-17: 1000 mL/h via INTRAVENOUS

## 2014-12-17 MED ORDER — ACETAMINOPHEN 650 MG RE SUPP: 650.0000 mg | Freq: Four times a day (QID) | RECTAL | Status: DC | PRN

## 2014-12-17 MED ORDER — SODIUM CHLORIDE 0.9 % IV SOLN
INTRAVENOUS | Status: DC
  Administered 2014-12-17 – 2014-12-18 (×2): via INTRAVENOUS

## 2014-12-17 NOTE — ED Notes (Signed)
Sitter with pt

## 2014-12-17 NOTE — ED Notes (Signed)
Pt lying in bed snoring

## 2014-12-17 NOTE — ED Notes (Signed)
Pt states "I am so sleepy because I stayed up all night drinking", when not speaking to pt she falls back asleep quickly, pt drifts off in the middle of sentances

## 2014-12-17 NOTE — ED Notes (Signed)
ENVIRONMENTAL ASSESSMENT Potentially harmful objects out of patient reach: Yes.   Personal belongings secured: Yes.   Patient dressed in hospital provided attire only: yes Plastic bags out of patient reach: Yes.   Patient care equipment (cords, cables, call bells, lines, and drains) shortened, removed, or accounted for: Yes.   Equipment and supplies removed from bottom of stretcher: Yes.   Potentially toxic materials out of patient reach: Yes.   Sharps container removed or out of patient reach: Yes.

## 2014-12-17 NOTE — ED Notes (Signed)
CBG- 99 
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
## 2014-12-17 NOTE — ED Notes (Signed)
Called to give report, floor wanted to know if sitter still needed.  Will call back when information obtained.
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
## 2014-12-17 NOTE — ED Notes (Signed)
IVC/ No Consult ordered at this time/plan to admit patient

## 2014-12-17 NOTE — ED Notes (Signed)
BEHAVIORAL HEALTH ROUNDING Patient sleeping: Yes.   Patient alert and oriented: not applicable Behavior appropriate: Yes.  , sleeping Nutrition and fluids offered: No Toileting and hygiene offered: Yes  Sitter present: yes Law enforcement present: Yes

## 2014-12-18 DIAGNOSIS — F603 Borderline personality disorder: Secondary | ICD-10-CM

## 2014-12-18 DIAGNOSIS — F1020 Alcohol dependence, uncomplicated: Secondary | ICD-10-CM

## 2014-12-18 DIAGNOSIS — F332 Major depressive disorder, recurrent severe without psychotic features: Secondary | ICD-10-CM

## 2014-12-18 HISTORY — DX: Borderline personality disorder: F60.3

## 2014-12-18 HISTORY — DX: Alcohol dependence, uncomplicated: F10.20

## 2014-12-18 HISTORY — DX: Major depressive disorder, recurrent severe without psychotic features: F33.2

## 2014-12-18 MED ORDER — GABAPENTIN 300 MG PO CAPS
600.0000 mg | ORAL_CAPSULE | Freq: Three times a day (TID) | ORAL | Status: DC
  Administered 2014-12-18 – 2014-12-19 (×3): 600 mg via ORAL
  Filled 2014-12-18 (×4): qty 2

## 2014-12-18 MED ORDER — CHLORDIAZEPOXIDE HCL 25 MG PO CAPS
50.0000 mg | ORAL_CAPSULE | ORAL | Status: DC
  Administered 2014-12-18: 50 mg via ORAL
  Filled 2014-12-18: qty 2

## 2014-12-18 MED ORDER — ARIPIPRAZOLE 5 MG PO TABS
5.0000 mg | ORAL_TABLET | Freq: Every day | ORAL | Status: DC
  Administered 2014-12-18 – 2014-12-19 (×2): 5 mg via ORAL
  Filled 2014-12-18 (×2): qty 1

## 2014-12-18 MED ORDER — ASPIRIN 81 MG PO CHEW
81.0000 mg | CHEWABLE_TABLET | Freq: Every day | ORAL | Status: DC
  Administered 2014-12-18 – 2014-12-19 (×2): 81 mg via ORAL
  Filled 2014-12-18 (×2): qty 1

## 2014-12-18 MED ORDER — CITALOPRAM HYDROBROMIDE 20 MG PO TABS
20.0000 mg | ORAL_TABLET | Freq: Every day | ORAL | Status: DC
  Administered 2014-12-18 – 2014-12-19 (×2): 20 mg via ORAL
  Filled 2014-12-18 (×2): qty 1

## 2014-12-18 MED ORDER — NICOTINE POLACRILEX 2 MG MT GUM
2.0000 mg | CHEWING_GUM | OROMUCOSAL | Status: DC | PRN
  Administered 2014-12-18 – 2014-12-19 (×5): 2 mg via ORAL
  Filled 2014-12-18 (×7): qty 1

## 2014-12-18 MED ORDER — LORAZEPAM 1 MG PO TABS
1.0000 mg | ORAL_TABLET | ORAL | Status: DC | PRN
  Administered 2014-12-18 – 2014-12-19 (×3): 1 mg via ORAL
  Filled 2014-12-18 (×3): qty 1

## 2014-12-18 MED ORDER — NICOTINE 14 MG/24HR TD PT24
14.0000 mg | MEDICATED_PATCH | Freq: Every day | TRANSDERMAL | Status: DC
  Filled 2014-12-18: qty 1

## 2014-12-18 NOTE — Progress Notes (Signed)
Patient is alert and oriented. Sinus rhythm on tele. Vitals stable. Still on 2L of oxygen for comfort PRN. Some home medications were started back today along with librium for alcohol withdrawal by Dr. Toni Amendlapacs. Patient has complained of generalized pain today, given tylenol once. Also given ativan PO for anxiety. MD planning to move patient to behavioral health in the morning. Will continue to monitor.  Patient states she has an allergy to the nicotene patch. States "I get scars all over me if I use those". Discontinued the patch and added nicoderm to allergies. Patient will use the gum ordered.

## 2014-12-18 NOTE — Progress Notes (Signed)
[facility] [place] at [facility]   PATIENT NAME: Sandy JaffeJennifer Mason    MR#:  [id number]  DATE OF BIRTH:  [birth year]  SUBJECTIVE:  CHIEF COMPLAINT: Drug overdose  Resting comfortably, reports she could not recall what happened last night. Could not recall that she overdosed with her medications and her roommates medications. Feeling depressed  REVIEW OF SYSTEMS:  CONSTITUTIONAL: No fever, reporting fatigue but no weakness.  EYES: No blurred or double vision.  EARS, NOSE, AND THROAT: No tinnitus or ear pain.  RESPIRATORY: No cough, shortness of breath, wheezing or hemoptysis.  CARDIOVASCULAR: No chest pain, orthopnea, edema.  GASTROINTESTINAL: No nausea, vomiting, diarrhea or abdominal pain.  GENITOURINARY: No dysuria, hematuria.  ENDOCRINE: No polyuria, nocturia,  HEMATOLOGY: No anemia, easy bruising or bleeding SKIN: No rash or lesion. MUSCULOSKELETAL: No joint pain or arthritis.   NEUROLOGIC: No tingling, numbness, weakness.  PSYCHIATRY: No anxiety or depression.   DRUG ALLERGIES:   Allergies  Allergen Reactions  . Depakote [Valproic Acid] Other (See Comments)    Reaction:  Seizures   . Haldol [Haloperidol Lactate] Other (See Comments)    Reaction:  Seizures   . Keflet [Cephalexin] Rash    VITALS:  Blood pressure 125/73, pulse 79, temperature 98.9 F (37.2 C), temperature source Oral, resp. rate 18, height 5\' 8"  (1.727 m), weight 97.75 kg (215 lb 8 oz), SpO2 100 %.  PHYSICAL EXAMINATION:  GENERAL:  39 y.o.-year-old patient lying in the bed with no acute distress.  EYES: Pupils equal, round, reactive to light and accommodation. No scleral icterus. Extraocular muscles intact.  HEENT: Head atraumatic, normocephalic. Oropharynx and nasopharynx clear.  NECK:  Supple, no jugular venous distention. No thyroid enlargement, no tenderness.  LUNGS: Normal breath sounds bilaterally, no wheezing, rales,rhonchi or crepitation. No use of accessory muscles of  respiration.  CARDIOVASCULAR: S1, S2 normal. No murmurs, rubs, or gallops.  ABDOMEN: Soft, nontender, nondistended. Bowel sounds present. No organomegaly or mass.  EXTREMITIES: No pedal edema, cyanosis, or clubbing.  NEUROLOGIC: Cranial nerves II through XII are intact. Muscle strength 5/5 in all extremities. Sensation intact. Gait not checked.  PSYCHIATRIC: The patient is alert and oriented x 3.  SKIN: No obvious rash, lesion, or ulcer.    LABORATORY PANEL:   CBC  Recent Labs Lab 12/17/14 2335  WBC 10.7  HGB 12.9  HCT 39.6  PLT 288   ------------------------------------------------------------------------------------------------------------------  Chemistries   Recent Labs Lab 12/17/14 1558 12/17/14 2335  NA 137 135  K 3.7 4.1  CL 99* 105  CO2 26 24  GLUCOSE 109* 98  BUN 14 13  CREATININE 0.72 0.73  CALCIUM 9.6 8.6*  AST 28  --   ALT 22  --   ALKPHOS 54  --   BILITOT 0.3  --    ------------------------------------------------------------------------------------------------------------------  Cardiac Enzymes No results for input(s): TROPONINI in the last 168 hours. ------------------------------------------------------------------------------------------------------------------  RADIOLOGY:  No results found.  EKG:   Orders placed or performed during the hospital encounter of 12/17/14  . ED EKG  . ED EKG  . EKG 12-Lead  . EKG 12-Lead    ASSESSMENT AND PLAN:   #591:39 year old female patient with polysubstance overdose.  Patient states that it's not intentional but there is no way to confirm it.   Hold her home medications Abilify, BuSpar, Neurontin, lithium carbonate, trazodone until evaluated by psychiatry Psychiatric consult is pending at this time  Continue IVC, one-on-one observation   #2 acute depression-continue IVC. Psychiatry consult is pending  #3  past history of CVA-no new symptoms at this time. We will start her on aspirin, check fasting  lipid panel in a.m.  #4 obesity-lifestyle modifications  #5 tobacco abuse-counseled patient to quit smoking. Will start her on nicotine patch  Provide GI and DVT prophylaxis       All the records are reviewed and case discussed with Care Management/Social Workerr. Management plans discussed with the patient, family and they are in agreement.  CODE STATUS: Full code  TOTAL TIME TAKING CARE OF THIS PATIENT:  .   POSSIBLE D/C IN 2-3 DAYS, DEPENDING ON CLINICAL CONDITION.   Ramonita Lab M.D on 12/18/2014 at 2:32 PM  Between 7am to 6pm - Pager - [phone] After 6pm go to [email or web address] - password [email or web address] [facility]  [place] [facility]  Office  [phone]  CC: Primary care physician; No primary care provider on file.
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
## 2014-12-18 NOTE — Progress Notes (Signed)
Per Dr. Amado CoeGouru, patient will move to behavioral health unit tomorrow morning. While patient was dressing, IV came out of hand. MD stated okay to leave out, discontinue IV meds and fluids.

## 2014-12-18 NOTE — Progress Notes (Signed)
Poison control called RN and is signing off on the patient due to vitals being stable and heart in sinus rhythm.

## 2014-12-18 NOTE — Progress Notes (Signed)
Chose not to discuss any stress factors. Patient expressed that she has already told too many people here and that she is a little embarrassed by it already. Clifton JamesMelvin Milinda Sweeney, Chaplain [phone]

## 2014-12-18 NOTE — Progress Notes (Signed)
Attempted to visit with patient about 10:15 A.M.. Patient stated that she was sleepy and come back. Will try a second attempt. Clifton JamesMelvin Jammal Sarr, Chaplain [phone]

## 2014-12-19 ENCOUNTER — Inpatient Hospital Stay
Admission: AD | Admit: 2014-12-19 | Discharge: 2014-12-23 | DRG: 885 | Disposition: A | Discharge status: Home / Self Care | Payer: No Typology Code available for payment source | Source: Intra-hospital | Attending: Psychiatry | Admitting: Psychiatry

## 2014-12-19 DIAGNOSIS — F1123 Opioid dependence with withdrawal: Secondary | ICD-10-CM | POA: Diagnosis present

## 2014-12-19 DIAGNOSIS — F603 Borderline personality disorder: Secondary | ICD-10-CM | POA: Diagnosis present

## 2014-12-19 DIAGNOSIS — Z8673 Personal history of transient ischemic attack (TIA), and cerebral infarction without residual deficits: Secondary | ICD-10-CM

## 2014-12-19 DIAGNOSIS — F1120 Opioid dependence, uncomplicated: Secondary | ICD-10-CM | POA: Diagnosis present

## 2014-12-19 DIAGNOSIS — Z79899 Other long term (current) drug therapy: Secondary | ICD-10-CM | POA: Diagnosis not present

## 2014-12-19 DIAGNOSIS — F17200 Nicotine dependence, unspecified, uncomplicated: Secondary | ICD-10-CM | POA: Diagnosis present

## 2014-12-19 DIAGNOSIS — F419 Anxiety disorder, unspecified: Secondary | ICD-10-CM | POA: Diagnosis present

## 2014-12-19 DIAGNOSIS — F10239 Alcohol dependence with withdrawal, unspecified: Secondary | ICD-10-CM | POA: Diagnosis present

## 2014-12-19 DIAGNOSIS — R45851 Suicidal ideations: Secondary | ICD-10-CM | POA: Diagnosis present

## 2014-12-19 DIAGNOSIS — G4700 Insomnia, unspecified: Secondary | ICD-10-CM | POA: Diagnosis present

## 2014-12-19 DIAGNOSIS — Z915 Personal history of self-harm: Secondary | ICD-10-CM | POA: Diagnosis not present

## 2014-12-19 DIAGNOSIS — Z888 Allergy status to other drugs, medicaments and biological substances status: Secondary | ICD-10-CM

## 2014-12-19 DIAGNOSIS — T424X2A Poisoning by benzodiazepines, intentional self-harm, initial encounter: Secondary | ICD-10-CM | POA: Diagnosis present

## 2014-12-19 DIAGNOSIS — T40602A Poisoning by unspecified narcotics, intentional self-harm, initial encounter: Secondary | ICD-10-CM | POA: Diagnosis present

## 2014-12-19 DIAGNOSIS — Z818 Family history of other mental and behavioral disorders: Secondary | ICD-10-CM | POA: Diagnosis not present

## 2014-12-19 DIAGNOSIS — F1310 Sedative, hypnotic or anxiolytic abuse, uncomplicated: Secondary | ICD-10-CM | POA: Diagnosis present

## 2014-12-19 DIAGNOSIS — F332 Major depressive disorder, recurrent severe without psychotic features: Secondary | ICD-10-CM | POA: Diagnosis present

## 2014-12-19 DIAGNOSIS — Z8659 Personal history of other mental and behavioral disorders: Secondary | ICD-10-CM

## 2014-12-19 DIAGNOSIS — F1020 Alcohol dependence, uncomplicated: Secondary | ICD-10-CM | POA: Diagnosis present

## 2014-12-19 LAB — BASIC METABOLIC PANEL
Anion gap: 6 (ref 5–15)
BUN: 9 mg/dL (ref 6–20)
CHLORIDE: 104 mmol/L (ref 101–111)
CO2: 27 mmol/L (ref 22–32)
CREATININE: 0.53 mg/dL (ref 0.44–1.00)
Calcium: 8.7 mg/dL — ABNORMAL LOW (ref 8.9–10.3)
GFR calc Af Amer: >60 mL/min (ref >60)
GLUCOSE: 98 mg/dL (ref 65–99)
Potassium: 3.5 mmol/L (ref 3.5–5.1)
Sodium: 137 mmol/L (ref 135–145)

## 2014-12-19 LAB — LIPID PANEL
CHOL/HDL RATIO: 4 ratio
CHOLESTEROL: 153 mg/dL (ref 0–200)
HDL: 38 mg/dL — ABNORMAL LOW (ref >40)
LDL Cholesterol: 78 mg/dL (ref 0–99)
Triglycerides: 187 mg/dL — ABNORMAL HIGH (ref <150)
VLDL: 37 mg/dL (ref 0–40)

## 2014-12-19 LAB — CBC
HEMATOCRIT: 39.3 % (ref 35.0–47.0)
Hemoglobin: 12.9 g/dL (ref 12.0–16.0)
MCH: 31.7 pg (ref 26.0–34.0)
MCHC: 32.8 g/dL (ref 32.0–36.0)
MCV: 96.6 fL (ref 80.0–100.0)
Platelets: 265 10*3/uL (ref 150–440)
RBC: 4.07 MIL/uL (ref 3.80–5.20)
RDW: 12.6 % (ref 11.5–14.5)
WBC: 6 10*3/uL (ref 3.6–11.0)

## 2014-12-19 MED ORDER — CITALOPRAM HYDROBROMIDE 20 MG PO TABS
20.0000 mg | ORAL_TABLET | Freq: Every day | ORAL | Status: DC
  Administered 2014-12-20 – 2014-12-22 (×3): 20 mg via ORAL
  Filled 2014-12-19 (×3): qty 1

## 2014-12-19 MED ORDER — ACETAMINOPHEN 325 MG PO TABS
650.0000 mg | ORAL_TABLET | Freq: Four times a day (QID) | ORAL | Status: DC | PRN
  Administered 2014-12-20 – 2014-12-23 (×4): 650 mg via ORAL
  Filled 2014-12-19 (×4): qty 2

## 2014-12-19 MED ORDER — CHLORDIAZEPOXIDE HCL 25 MG PO CAPS
50.0000 mg | ORAL_CAPSULE | ORAL | Status: AC
  Administered 2014-12-19: 50 mg via ORAL
  Filled 2014-12-19: qty 2

## 2014-12-19 MED ORDER — GABAPENTIN 300 MG PO CAPS
600.0000 mg | ORAL_CAPSULE | Freq: Three times a day (TID) | ORAL | Status: DC
Start: 2014-12-19 — End: 2014-12-23
  Administered 2014-12-19 – 2014-12-23 (×11): 600 mg via ORAL
  Filled 2014-12-19 (×13): qty 2

## 2014-12-19 MED ORDER — ACETAMINOPHEN 325 MG PO TABS: 650.0000 mg | ORAL_TABLET | Freq: Four times a day (QID) | ORAL | Status: DC | PRN

## 2014-12-19 MED ORDER — NICOTINE POLACRILEX 2 MG MT GUM
2.0000 mg | CHEWING_GUM | OROMUCOSAL | Status: DC | PRN
  Administered 2014-12-20 (×2): 2 mg via ORAL
  Filled 2014-12-19 (×4): qty 1

## 2014-12-19 MED ORDER — MAGNESIUM HYDROXIDE 400 MG/5ML PO SUSP
30.0000 mL | Freq: Every day | ORAL | Status: DC | PRN
Start: 2014-12-19 — End: 2014-12-23

## 2014-12-19 MED ORDER — NICOTINE POLACRILEX 2 MG MT GUM: 2.0000 mg | CHEWING_GUM | OROMUCOSAL | Status: DC | PRN

## 2014-12-19 MED ORDER — ARIPIPRAZOLE 10 MG PO TABS
5.0000 mg | ORAL_TABLET | Freq: Every day | ORAL | Status: DC
  Administered 2014-12-20: 5 mg via ORAL
  Filled 2014-12-19: qty 1

## 2014-12-19 MED ORDER — CITALOPRAM HYDROBROMIDE 20 MG PO TABS: 20.0000 mg | ORAL_TABLET | Freq: Every day | ORAL | Status: DC

## 2014-12-19 MED ORDER — ALPRAZOLAM 0.5 MG PO TABS
0.5000 mg | ORAL_TABLET | Freq: Once | ORAL | Status: AC
  Administered 2014-12-19: 0.5 mg via ORAL
  Filled 2014-12-19: qty 1

## 2014-12-19 MED ORDER — ALUM & MAG HYDROXIDE-SIMETH 200-200-20 MG/5ML PO SUSP
30.0000 mL | ORAL | Status: DC | PRN
  Administered 2014-12-22: 30 mL via ORAL
  Filled 2014-12-19: qty 30

## 2014-12-19 NOTE — Progress Notes (Addendum)
Pt. Requested nicotine gum x1 with effective results. Pt continues with sitter. Pt. Has slept the entire night except for occasional BRP.  Transeferring to behavior health in the A.M. No signs or c/o pain or distress observed. Will continue to monitor pt.

## 2014-12-19 NOTE — Consult Note (Signed)
  Psychiatry: Follow-up for this patient with depression and personality disorder alcohol abuse and drug abuse. Today she says that she is still feeling depressed and negative about herself. Continues to state that she is having suicidal thoughts. Says that she feels hopeless and helpless about her life and very negative about herself. Also complains that she is feeling shaky and sick to her stomach.  On review of systems says she can't eat well. Feeling sick to her stomach. Feeling a little jittery. Mood remains depressed. Active suicidal ideation denies psychotic symptoms.  On mental status she is taking care of herself adequately. Eye contact reasonably good. Psychomotor activity normal. Speech normal rate tone and volume. Affect dysphoric blunted. Not hostile. Denies homicidal thoughts but endorses suicidal ideation. Denies hallucinations. Cognitively intact.  Patient continues to be under involuntary commitment and will be transferred to psychiatry today. Transfer was postponed yesterday because of practical concerns on the part of medicine. Orders are done for discharge and readmission. Primary treatment team can do history and physical I will manage any further orders that the nurses see as being needed.  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

## 2014-12-19 NOTE — Progress Notes (Signed)
Report called to Aline BrochureEric,  Behavior Health. Pt signed discharge papers and getting dressed at this time. Very pleasant and states she dreads the support group but knows she needs it. No distress noted. No IV, telemetry discontinued.

## 2014-12-19 NOTE — Progress Notes (Signed)
Alert and oriented. Pleasant and cooperative. Reports ETOH detox symptoms. None observed. Reports recent binge drinking. Will encourage to participate in unit activities. Feels remorseful for recent "impulsive" behavior and wants to learn to control this better. Will encourage to attend educational groups and speak with staff about working towards this goal. Reports that "I remember where every thing is" when unit orientation attempted.
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
## 2014-12-20 ENCOUNTER — Encounter: Payer: Self-pay | Admitting: Psychiatry

## 2014-12-20 DIAGNOSIS — F1120 Opioid dependence, uncomplicated: Secondary | ICD-10-CM | POA: Diagnosis present

## 2014-12-20 HISTORY — DX: Opioid dependence, uncomplicated: F11.20

## 2014-12-20 MED ORDER — ARIPIPRAZOLE 10 MG PO TABS
5.0000 mg | ORAL_TABLET | Freq: Once | ORAL | Status: AC
  Administered 2014-12-20: 14:00:00 via ORAL
  Filled 2014-12-20: qty 1

## 2014-12-20 MED ORDER — CLONIDINE HCL 0.1 MG PO TABS
0.1000 mg | ORAL_TABLET | Freq: Three times a day (TID) | ORAL | Status: DC
  Administered 2014-12-20 – 2014-12-23 (×9): 0.1 mg via ORAL
  Filled 2014-12-20 (×9): qty 1

## 2014-12-20 MED ORDER — CHLORDIAZEPOXIDE HCL 25 MG PO CAPS
25.0000 mg | ORAL_CAPSULE | Freq: Four times a day (QID) | ORAL | Status: DC
  Administered 2014-12-20 – 2014-12-23 (×12): 25 mg via ORAL
  Filled 2014-12-20 (×12): qty 1

## 2014-12-20 MED ORDER — NICOTINE 10 MG IN INHA
1.0000 | RESPIRATORY_TRACT | Status: DC | PRN
  Administered 2014-12-20: 1 via RESPIRATORY_TRACT
  Filled 2014-12-20: qty 36

## 2014-12-20 MED ORDER — DIPHENOXYLATE-ATROPINE 2.5-0.025 MG PO TABS
2.0000 | ORAL_TABLET | Freq: Two times a day (BID) | ORAL | Status: DC | PRN
  Administered 2014-12-20: 2 via ORAL
  Filled 2014-12-20: qty 2

## 2014-12-20 MED ORDER — TRAZODONE HCL 100 MG PO TABS
100.0000 mg | ORAL_TABLET | Freq: Every day | ORAL | Status: DC
  Administered 2014-12-20 – 2014-12-22 (×3): 100 mg via ORAL
  Filled 2014-12-20 (×3): qty 1

## 2014-12-20 MED ORDER — CYCLOBENZAPRINE HCL 10 MG PO TABS
5.0000 mg | ORAL_TABLET | Freq: Three times a day (TID) | ORAL | Status: DC
  Administered 2014-12-20 – 2014-12-22 (×8): 5 mg via ORAL
  Administered 2014-12-23: 10 mg via ORAL
  Filled 2014-12-20 (×10): qty 1

## 2014-12-20 MED ORDER — PRAZOSIN HCL 2 MG PO CAPS
2.0000 mg | ORAL_CAPSULE | Freq: Every day | ORAL | Status: DC
  Administered 2014-12-20 – 2014-12-22 (×3): 2 mg via ORAL
  Filled 2014-12-20 (×3): qty 1

## 2014-12-20 MED ORDER — IBUPROFEN 800 MG PO TABS
800.0000 mg | ORAL_TABLET | Freq: Four times a day (QID) | ORAL | Status: DC | PRN
  Administered 2014-12-20 – 2014-12-23 (×8): 800 mg via ORAL
  Filled 2014-12-20 (×8): qty 1

## 2014-12-20 MED ORDER — ARIPIPRAZOLE 10 MG PO TABS
10.0000 mg | ORAL_TABLET | Freq: Every day | ORAL | Status: DC
  Administered 2014-12-21 – 2014-12-23 (×3): 10 mg via ORAL
  Filled 2014-12-20 (×3): qty 1

## 2014-12-20 NOTE — Progress Notes (Signed)
Patient is alert and oriented x 4 denies pain or discomfort, but endorses anxiety, and insomnia medicated with PRN standing medication, which was effective, Patient's affect is flat but brightens upon approach, pt has some insight  Into her admission and is actively participating in her treatment plan.

## 2014-12-20 NOTE — Progress Notes (Signed)
Recreation Therapy Notes  INPATIENT RECREATION THERAPY ASSESSMENT  Patient Details Name: Sandy Mason MRN: [id number] DOB: Sep 19, 1975 Today's Date: 12/20/2014  Patient Stressors: Family  Coping Skills:   Isolate, Arguments, Substance Abuse, Art/Dance, Music  Personal Challenges: Anger, Communication, Concentration, Decision-Making, Problem-Solving, Self-Esteem/Confidence, Stress Management, Trusting Others  Leisure Interests (2+):  Music - Listen, Individual - Other (Comment) (Take daughter to park)  Awareness of Community Resources:  Yes  Community Resources:  [employer], [place]  Current Use: Yes  If no, Barriers?:    Patient Strengths:  Personality, hair  Patient Identified Areas of Improvement:  Being able to control temper  Current Recreation Participation:  Listening to music and taking daughter to park  Patient Goal for Hospitalization:  To get medicine correct  [place]  County [place] Residence:  [place]   Current SI (including self-harm):  No  Current HI:  No  Consent to Intern Participation: N/A   Jacquelynn CreeGreene,Rosann Gorum M, LRT/CTRS 12/20/2014, 4:32 PM

## 2014-12-20 NOTE — [facility] Group Notes (Signed)
[facility]  12/20/2014 12:23 PM  Participation Quality:  Appropriate  Affect:  Blunted  Cognitive:  Appropriate  Insight:  Lacking  Engagement in Group:  Developing/Improving  Modes of Intervention:  Discussion and Support  Summary of Progress/Problems:Patients goal today  Is to see if she can return to her friends house. She reports she was still feeling sick  Sandy Mason, Sandy Mason 12/20/2014, 12:23 PM

## 2014-12-20 NOTE — Progress Notes (Signed)
Recreation Therapy Notes  Date: 05.23.16 Time: 3:00 pm Location: Craft Room  Group Topic: Self-expression  Goal Area(s) Addresses:  Patient will identify one color per emotion listed on wheel. Patient will verbalize benefit of using art as a means of self-expression. Patient will verbalize one emotion experienced during session. Patient will be educated on other forms of self-expression.  Behavioral Response: Attentive, Interactive  Intervention: Emotion Wheel  Activity: Patients were given a worksheet with 7 different emotions and were instructed to pick a color for each emotion.   Education: LRT educated patients on different forms of self-expression.  Education Outcome: Acknowledges education/In group clarification offered  Clinical Observations/Feedback: Patient completed activity by picking a color for each emotion. Patient contributed to group discussion by stating why she picked certain colors for the emotions, how she felt seeing her emotions in color, and what emotions she felt during the activity.   Jacquelynn CreeGreene,Maclin Guerrette M, LRT/CTRS 12/20/2014 4:19 PM

## 2014-12-20 NOTE — Plan of Care (Signed)
Problem: Ineffective individual coping Goal: STG: Patient will participate in after care plan Outcome: Progressing Patient is participating in the unit program, verbalization of feelings encouraged.

## 2014-12-20 NOTE — [facility] Group Notes (Signed)
Adult Psychoeducational Group Note  Date:  12/20/2014 Time:  2:46 PM  Group Topic/Focus:  Understanding Treatment  Participation Level:  Minimal  Participation Quality:  Attentive  Affect:  stated she was having difficulty with her withdrawal symptoms  Cognitive:  Appropriate  Insight: Appropriate  Engagement in Group:  left group early  Modes of Intervention:  Education  Additional Comments:   Gabrianna Fassnacht ANN Lyndel Dancel 12/20/2014, 2:46 PM

## 2014-12-20 NOTE — Progress Notes (Signed)
LCSW met with patient to do assessment and to attempt to advocate and resolve current living situation. Patient signed 2 consents 1 for [facility] and the other to her land lady [facility].  LCSW called LL and patient is not able to return there. LL reported it was only a temporary situation and encouraged patient to call her, she is not angry over the events but she and husband decided this wasn't working out. Patient is now homeless and will go to shelter. Patient was very emotional after call and requested to do assessment tomorrow. LCSW concurred.

## 2014-12-20 NOTE — H&P (Signed)
Psychiatric Admission Assessment Adult  Patient Identification: Sandy Mason MRN:  [id number] Date of Evaluation:  12/20/2014 Chief Complaint:  depression Principal Diagnosis: Severe recurrent major depression without psychotic features Diagnosis:   Patient Active Problem List   Diagnosis Date Noted  . Opioid use disorder, severe, dependence [F11.20] 12/20/2014  . 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 Alcohol use disorder severe. [F10.20] 12/18/2014  . Alcohol withdrawal [F10.239] 12/18/2014  . Benzodiazepine use disorder severe. [F13.10] 12/18/2014  . Borderline personality disorder [F60.3] 12/18/2014  . Drug overdose, intentional [T50.902A] 12/17/2014   History of Present Illness: Identifying data. Sandy Mason is a 39 year old female with history of depression, anxiety, and substance use who was admitted after overdose on narcotic painkillers and benzodiazepines in the context of social stressors and relapse on alcohol.  Chief complaint. "I was stressed out."  History of present illness. Sandy Mason was hospitalized about a month [facility] about a month ago. She reports good compliance with medications. She has been working with peers support team from [place] a her outpatient mental health providers. She reports that she has been sober abstaining from alcohol and illicit substances or prescription pills. On the day of admission however after she was a as she relapsed on alcohol. This led to overdose on narcotic painkillers and Xanax that she stole from her roommate. Supposedly she took a hefty dose. She is not sure how she ended up in the hospital. She reports that in spite of it taking medications as prescribed she still feels depressed and sluggish. It is been difficult for her to do the work. Even though she sprouts that she has been able to keep a job. As she reports poor sleep, low energy, feeling of guilt and  hopelessness, poor memory and concentration. She denies having suicidal thoughts chronically. When on medication, her anxiety is better with fewer panic attacks. She also reports much improvement in PTSD symptoms while on Minipress. She denies psychotic symptoms. Denies symptoms suggestive of bipolar mania.   Past psychiatric history. She has 3 prior hospitalizations at [facility] as well as [facility]. She has been tried on numerous medications in the past. This includes lithium and Depakote likely prescribed for presumed bipolar disorder. She is quite happy with her current regimen. There were multiple suicide attempts and attempts to treat substance abuse.   Family psychiatric history. Both parents reportedly diagnosed with bipolar.  Social history. She graduated from high school. She has 2 children who live with the father but the patient has joint custody and sees them frequently. She lives with a friend and it is unclear if it is possible and the longer given that she stole medications. He is unable to return to her current living arrangements, she will go to the homeless shelter.  Total Time spent with patient: 1 hour  Past Medical History:  Past Medical History  Diagnosis Date  . CVA (cerebral infarction)   . Seizures    History reviewed. No pertinent past surgical history. Family History: History reviewed. No pertinent family history. Social History:  History  Alcohol Use  . 3.6 oz/week  . 6 Cans of beer per week     History  Drug Use  . Yes  . Special: Benzodiazepines    History   Social History  . Marital Status: Single    Spouse Name: N/A  . Number of Children: N/A  . Years of Education: N/A   Social History Main Topics  .  Smoking status: Current Every Day Smoker  . Smokeless tobacco: Not on file  . Alcohol Use: 3.6 oz/week    6 Cans of beer per week  . Drug Use: Yes    Special: Benzodiazepines  . Sexual Activity: Yes    Birth Control/ Protection: IUD   Other  Topics Concern  . None   Social History Narrative   Additional Social History:    History of alcohol / drug use?: Yes Withdrawal Symptoms: Seizures, Cramps, Sweats, Diarrhea                     Musculoskeletal: Strength & Muscle Tone: within normal limits Gait & Station: normal Patient leans: N/A  Psychiatric Specialty Exam: Physical Exam  Nursing note and vitals reviewed.   Review of Systems  Gastrointestinal: Positive for nausea, abdominal pain and diarrhea.  Neurological: Positive for tremors.  All other systems reviewed and are negative.   Blood pressure 110/52, pulse 69, temperature 98.3 F (36.8 C), temperature source Oral, resp. rate 18, height '5\' 8"'  (1.727 m), weight 95.709 kg (211 lb), SpO2 98 %.Body mass index is 32.09 kg/(m^2).  See SRA.                                                  Sleep:  Number of Hours: 7   Risk to Self: Is patient at risk for suicide?: Yes Risk to Others:   Prior Inpatient Therapy:   Prior Outpatient Therapy:    Alcohol Screening: 1. How often do you have a drink containing alcohol?: Monthly or less 2. How many drinks containing alcohol do you have on a typical day when you are drinking?: 10 or more 3. How often do you have six or more drinks on one occasion?: Less than monthly Preliminary Score: 5 4. How often during the last year have you found that you were not able to stop drinking once you had started?: Less than monthly 5. How often during the last year have you failed to do what was normally expected from you becasue of drinking?: Never 6. How often during the last year have you needed a first drink in the morning to get yourself going after a heavy drinking session?: Less than monthly 7. How often during the last year have you had a feeling of guilt of remorse after drinking?: Less than monthly 8. How often during the last year have you been unable to remember what happened the night before because  you had been drinking?: Less than monthly 9. Have you or someone else been injured as a result of your drinking?: No 10. Has a relative or friend or a doctor or another health worker been concerned about your drinking or suggested you cut down?: No Alcohol Use Disorder Identification Test Final Score (AUDIT): 10 Brief Intervention: Yes  Allergies:   Allergies  Allergen Reactions  . Depakote [Valproic Acid] Other (See Comments)    Reaction:  Seizures   . Haldol [Haloperidol Lactate] Other (See Comments)    Reaction:  Seizures   . Nicoderm [Nicotine] Dermatitis    [name]  . Keflet [Cephalexin] Rash   Lab Results:  Results for orders placed or performed during the hospital encounter of 12/17/14 (from the past 48 hour(s))  Lipid panel  Status: Abnormal   Collection Time: 12/19/14  4:13 AM  Result Value Ref Range   Cholesterol 153 0 - 200 mg/dL   Triglycerides 187 (H) <150 mg/dL   HDL 38 (L) >40 mg/dL   Total CHOL/HDL Ratio 4.0 RATIO   VLDL 37 0 - 40 mg/dL   LDL Cholesterol 78 0 - 99 mg/dL    Comment:        Total Cholesterol/HDL:CHD Risk Coronary Heart Disease Risk Table                     Men   Women  1/2 Average Risk   3.4   3.3  Average Risk       5.0   4.4  2 X Average Risk   9.6   7.1  3 X Average Risk  23.4   11.0        Use the calculated Patient Ratio above and the CHD Risk Table to determine the patient's CHD Risk.        ATP III CLASSIFICATION (LDL):  <100     mg/dL   Optimal  100-129  mg/dL   Near or Above                    Optimal  130-159  mg/dL   Borderline  160-189  mg/dL   High  >190     mg/dL   Very High   CBC     Status: None   Collection Time: 12/19/14  4:13 AM  Result Value Ref Range   WBC 6.0 3.6 - 11.0 K/uL   RBC 4.07 3.80 - 5.20 MIL/uL   Hemoglobin 12.9 12.0 - 16.0 g/dL   HCT 39.3 35.0 - 47.0 %   MCV 96.6 80.0 - 100.0 fL   MCH 31.7 26.0 - 34.0 pg   MCHC 32.8 32.0 - 36.0 g/dL   RDW  12.6 11.5 - 14.5 %   Platelets 265 150 - 440 K/uL  Basic metabolic panel     Status: Abnormal   Collection Time: 12/19/14  4:13 AM  Result Value Ref Range   Sodium 137 135 - 145 mmol/L   Potassium 3.5 3.5 - 5.1 mmol/L   Chloride 104 101 - 111 mmol/L   CO2 27 22 - 32 mmol/L   Glucose, Bld 98 65 - 99 mg/dL   BUN 9 6 - 20 mg/dL   Creatinine, Ser 0.53 0.44 - 1.00 mg/dL   Calcium 8.7 (L) 8.9 - 10.3 mg/dL   GFR calc non Af Amer >60 >60 mL/min   GFR calc Af Amer >60 >60 mL/min    Comment: (NOTE) The eGFR has been calculated using the CKD EPI equation. This calculation has not been validated in all clinical situations. eGFR's persistently <60 mL/min signify possible Chronic Kidney Disease.    Anion gap 6 5 - 15   Current Medications: Current Facility-Administered Medications  Medication Dose Route Frequency Provider Last Rate Last Dose  . acetaminophen (TYLENOL) tablet 650 mg  650 mg Oral Q6H PRN Gonzella Lex, MD   650 mg at 12/20/14 0824  . alum & mag hydroxide-simeth (MAALOX/MYLANTA) 200-200-20 MG/5ML suspension 30 mL  30 mL Oral Q4H PRN Gonzella Lex, MD      . Derrill Memo ON 12/21/2014] ARIPiprazole (ABILIFY) tablet 10 mg  10 mg Oral Daily Seichi Kaufhold B Deren Degrazia, MD      . ARIPiprazole (ABILIFY) tablet 5 mg  5 mg Oral Once Clovis Fredrickson, MD      .  chlordiazePOXIDE (LIBRIUM) capsule 25 mg  25 mg Oral QID Allon Costlow B Larkin Alfred, MD      . citalopram (CELEXA) tablet 20 mg  20 mg Oral Daily Gonzella Lex, MD   20 mg at 12/20/14 0918  . cloNIDine (CATAPRES) tablet 0.1 mg  0.1 mg Oral TID Carmon Brigandi B Zubair Lofton, MD      . cyclobenzaprine (FLEXERIL) tablet 5 mg  5 mg Oral TID Bernardine Langworthy B Dearion Huot, MD      . diphenoxylate-atropine (LOMOTIL) 2.5-0.025 MG per tablet 2 tablet  2 tablet Oral BID PRN Clovis Fredrickson, MD   2 tablet at 12/20/14 1201  . gabapentin (NEURONTIN) capsule 600 mg  600 mg Oral TID Gonzella Lex, MD   600 mg at 12/20/14 0919  . ibuprofen (ADVIL,MOTRIN) tablet 800 mg   800 mg Oral Q6H PRN Stefhanie Kachmar B Emanii Bugbee, MD      . magnesium hydroxide (MILK OF MAGNESIA) suspension 30 mL  30 mL Oral Daily PRN Gonzella Lex, MD      . nicotine polacrilex (NICORETTE) gum 2 mg  2 mg Oral PRN Gonzella Lex, MD   2 mg at 12/20/14 1203  . prazosin (MINIPRESS) capsule 2 mg  2 mg Oral QHS Jameison Haji B Sydna Brodowski, MD      . traZODone (DESYREL) tablet 100 mg  100 mg Oral QHS Raheen Capili B Dotty Gonzalo, MD       PTA Medications: Prescriptions prior to admission  Medication Sig Dispense Refill Last Dose  . acetaminophen (TYLENOL) 325 MG tablet Take 2 tablets (650 mg total) by mouth every 6 (six) hours as needed for mild pain (or Fever >/= 101).   Past Week at Unknown time  . ARIPiprazole (ABILIFY) 10 MG tablet Take 5 mg by mouth daily.   12/19/2014 at Unknown time  . busPIRone (BUSPAR) 10 MG tablet Take 10 mg by mouth 3 (three) times daily.   12/19/2014 at Unknown time  . citalopram (CELEXA) 20 MG tablet Take 1 tablet (20 mg total) by mouth daily. 30 tablet 0 12/19/2014 at Unknown time  . gabapentin (NEURONTIN) 300 MG capsule Take 600 mg by mouth 3 (three) times daily.   12/19/2014 at Unknown time  . GARCINIA CAMBOGIA-CHROMIUM PO Take 1 capsule by mouth daily.   Past Week at Unknown time  . lithium carbonate 300 MG capsule Take 300 mg by mouth 2 (two) times daily.   12/19/2014 at Unknown time  . naproxen sodium (ANAPROX) 220 MG tablet Take 220 mg by mouth 2 (two) times daily as needed (for pain).   Past Week at Unknown time  . nicotine polacrilex (NICORETTE) 2 MG gum Take 1 each (2 mg total) by mouth as needed for smoking cessation. 100 tablet 0 Past Month at Unknown time  . prazosin (MINIPRESS) 1 MG capsule Take 2 mg by mouth at bedtime.   12/19/2014 at Unknown time  . traZODone (DESYREL) 50 MG tablet Take 50 mg by mouth at bedtime.   Past Month at Unknown time  . vitamin B-12 (CYANOCOBALAMIN) 1000 MCG tablet Take 1,000 mcg by mouth daily.   Past Month at Unknown time    Previous Psychotropic  Medications: Yes   Substance Abuse History in the last 12 months:  Yes.      Consequences of Substance Abuse: Loss of housing  Results for orders placed or performed during the hospital encounter of 12/17/14 (from the past 72 hour(s))  Urine Drug Screen, Qualitative [facility])     Status: Abnormal  Collection Time: 12/17/14  3:57 PM  Result Value Ref Range   Tricyclic, Ur Screen NONE DETECTED NONE DETECTED   Amphetamines, Ur Screen NONE DETECTED NONE DETECTED   MDMA (Ecstasy)Ur Screen NONE DETECTED NONE DETECTED   Cocaine Metabolite,Ur [place] NONE DETECTED NONE DETECTED   Opiate, Ur Screen POSITIVE (A) NONE DETECTED   Phencyclidine (PCP) Ur S NONE DETECTED NONE DETECTED   Cannabinoid 50 Ng, Ur [place] NONE DETECTED NONE DETECTED   Barbiturates, Ur Screen NONE DETECTED NONE DETECTED   Benzodiazepine, Ur Scrn POSITIVE (A) NONE DETECTED   Methadone Scn, Ur NONE DETECTED NONE DETECTED    Comment: (NOTE) 195  Tricyclics, urine               Cutoff 1000 ng/mL 200  Amphetamines, urine             Cutoff 1000 ng/mL 300  MDMA (Ecstasy), urine           Cutoff 500 ng/mL 400  Cocaine Metabolite, urine       Cutoff 300 ng/mL 500  Opiate, urine                   Cutoff 300 ng/mL 600  Phencyclidine (PCP), urine      Cutoff 25 ng/mL 700  Cannabinoid, urine              Cutoff 50 ng/mL 800  Barbiturates, urine             Cutoff 200 ng/mL 900  Benzodiazepine, urine           Cutoff 200 ng/mL 1000 Methadone, urine                Cutoff 300 ng/mL 1100 1200 The urine drug screen provides only a preliminary, unconfirmed 1300 analytical test result and should not be used for non-medical 1400 purposes. Clinical consideration and professional judgment should 1500 be applied to any positive drug screen result due to possible 1600 interfering substances. A more specific alternate chemical method 1700 must be used in order to obtain a confirmed analytical result.  1800 Gas chromato graphy / mass spectrometry  (GC/MS) is the preferred 1900 confirmatory method.   Urinalysis complete, with microscopic [facility])     Status: Abnormal   Collection Time: 12/17/14  3:57 PM  Result Value Ref Range   Color, Urine YELLOW (A) YELLOW   APPearance HAZY (A) CLEAR   Glucose, UA NEGATIVE NEGATIVE mg/dL   Bilirubin Urine NEGATIVE NEGATIVE   Ketones, ur NEGATIVE NEGATIVE mg/dL   Specific Gravity, Urine 1.035 (H) 1.005 - 1.030   Hgb urine dipstick 3+ (A) NEGATIVE   pH 5.0 5.0 - 8.0   Protein, ur 30 (A) NEGATIVE mg/dL   Nitrite NEGATIVE NEGATIVE   Leukocytes, UA NEGATIVE NEGATIVE   RBC / HPF TOO NUMEROUS TO COUNT 0 - 5 RBC/hpf   WBC, UA 0-5 0 - 5 WBC/hpf   Bacteria, UA NONE SEEN NONE SEEN   Squamous Epithelial / LPF 0-5 (A) NONE SEEN   Mucous PRESENT    Hyaline Casts, UA PRESENT   Pregnancy, urine     Status: None   Collection Time: 12/17/14  3:57 PM  Result Value Ref Range   Preg Test, Ur NEGATIVE NEGATIVE  CBC     Status: Abnormal   Collection Time: 12/17/14  3:58 PM  Result Value Ref Range   WBC 14.1 (H) 3.6 - 11.0 K/uL   RBC 4.38 3.80 - 5.20 MIL/uL   Hemoglobin  14.1 12.0 - 16.0 g/dL   HCT 41.6 35.0 - 47.0 %   MCV 95.1 80.0 - 100.0 fL   MCH 32.2 26.0 - 34.0 pg   MCHC 33.9 32.0 - 36.0 g/dL   RDW 12.9 11.5 - 14.5 %   Platelets 314 150 - 440 K/uL  Comprehensive metabolic panel     Status: Abnormal   Collection Time: 12/17/14  3:58 PM  Result Value Ref Range   Sodium 137 135 - 145 mmol/L   Potassium 3.7 3.5 - 5.1 mmol/L   Chloride 99 (L) 101 - 111 mmol/L   CO2 26 22 - 32 mmol/L   Glucose, Bld 109 (H) 65 - 99 mg/dL   BUN 14 6 - 20 mg/dL   Creatinine, Ser 0.72 0.44 - 1.00 mg/dL   Calcium 9.6 8.9 - 10.3 mg/dL   Total Protein 8.3 (H) 6.5 - 8.1 g/dL   Albumin 4.5 3.5 - 5.0 g/dL   AST 28 15 - 41 U/L   ALT 22 14 - 54 U/L   Alkaline Phosphatase 54 38 - 126 U/L   Total Bilirubin 0.3 0.3 - 1.2 mg/dL   GFR calc non Af Amer >60 >60 mL/min   GFR calc Af Amer >60 >60 mL/min    Comment: (NOTE) The eGFR  has been calculated using the CKD EPI equation. This calculation has not been validated in all clinical situations. eGFR's persistently <60 mL/min signify possible Chronic Kidney Disease.    Anion gap 12 5 - 15  Ethanol (ETOH)     Status: None   Collection Time: 12/17/14  3:58 PM  Result Value Ref Range   Alcohol, Ethyl (B) <5 <5 mg/dL    Comment:        LOWEST DETECTABLE LIMIT FOR SERUM ALCOHOL IS 11 mg/dL FOR MEDICAL PURPOSES ONLY   Acetaminophen level     Status: None   Collection Time: 12/17/14  3:58 PM  Result Value Ref Range   Acetaminophen (Tylenol), Serum 18 10 - 30 ug/mL    Comment:        THERAPEUTIC CONCENTRATIONS VARY SIGNIFICANTLY. A RANGE OF 10-30 ug/mL MAY BE AN EFFECTIVE CONCENTRATION FOR MANY PATIENTS. HOWEVER, SOME ARE BEST TREATED AT CONCENTRATIONS OUTSIDE THIS RANGE. ACETAMINOPHEN CONCENTRATIONS >150 ug/mL AT 4 HOURS AFTER INGESTION AND >50 ug/mL AT 12 HOURS AFTER INGESTION ARE OFTEN ASSOCIATED WITH TOXIC REACTIONS.   Salicylate level     Status: None   Collection Time: 12/17/14  3:58 PM  Result Value Ref Range   Salicylate Lvl <6.4 2.8 - 30.0 mg/dL  Glucose, capillary     Status: None   Collection Time: 12/17/14  3:58 PM  Result Value Ref Range   Glucose-Capillary 99 65 - 99 mg/dL  Lithium level     Status: Abnormal   Collection Time: 12/17/14  3:58 PM  Result Value Ref Range   Lithium Lvl <0.06 (L) 0.60 - 1.20 mmol/L  TSH     Status: None   Collection Time: 12/17/14  3:58 PM  Result Value Ref Range   TSH 1.075 0.350 - 4.500 uIU/mL  CBC     Status: None   Collection Time: 12/17/14 11:35 PM  Result Value Ref Range   WBC 10.7 3.6 - 11.0 K/uL   RBC 4.08 3.80 - 5.20 MIL/uL   Hemoglobin 12.9 12.0 - 16.0 g/dL   HCT 39.6 35.0 - 47.0 %   MCV 97.2 80.0 - 100.0 fL   MCH 31.8 26.0 - 34.0 pg  MCHC 32.7 32.0 - 36.0 g/dL   RDW 13.2 11.5 - 14.5 %   Platelets 288 150 - 440 K/uL  Basic metabolic panel     Status: Abnormal   Collection Time: 12/17/14  11:35 PM  Result Value Ref Range   Sodium 135 135 - 145 mmol/L   Potassium 4.1 3.5 - 5.1 mmol/L   Chloride 105 101 - 111 mmol/L   CO2 24 22 - 32 mmol/L   Glucose, Bld 98 65 - 99 mg/dL   BUN 13 6 - 20 mg/dL   Creatinine, Ser 0.73 0.44 - 1.00 mg/dL   Calcium 8.6 (L) 8.9 - 10.3 mg/dL   GFR calc non Af Amer >60 >60 mL/min   GFR calc Af Amer >60 >60 mL/min    Comment: (NOTE) The eGFR has been calculated using the CKD EPI equation. This calculation has not been validated in all clinical situations. eGFR's persistently <60 mL/min signify possible Chronic Kidney Disease.    Anion gap 6 5 - 15  Lipid panel     Status: Abnormal   Collection Time: 12/19/14  4:13 AM  Result Value Ref Range   Cholesterol 153 0 - 200 mg/dL   Triglycerides 187 (H) <150 mg/dL   HDL 38 (L) >40 mg/dL   Total CHOL/HDL Ratio 4.0 RATIO   VLDL 37 0 - 40 mg/dL   LDL Cholesterol 78 0 - 99 mg/dL    Comment:        Total Cholesterol/HDL:CHD Risk Coronary Heart Disease Risk Table                     Men   Women  1/2 Average Risk   3.4   3.3  Average Risk       5.0   4.4  2 X Average Risk   9.6   7.1  3 X Average Risk  23.4   11.0        Use the calculated Patient Ratio above and the CHD Risk Table to determine the patient's CHD Risk.        ATP III CLASSIFICATION (LDL):  <100     mg/dL   Optimal  100-129  mg/dL   Near or Above                    Optimal  130-159  mg/dL   Borderline  160-189  mg/dL   High  >190     mg/dL   Very High   CBC     Status: None   Collection Time: 12/19/14  4:13 AM  Result Value Ref Range   WBC 6.0 3.6 - 11.0 K/uL   RBC 4.07 3.80 - 5.20 MIL/uL   Hemoglobin 12.9 12.0 - 16.0 g/dL   HCT 39.3 35.0 - 47.0 %   MCV 96.6 80.0 - 100.0 fL   MCH 31.7 26.0 - 34.0 pg   MCHC 32.8 32.0 - 36.0 g/dL   RDW 12.6 11.5 - 14.5 %   Platelets 265 150 - 440 K/uL  Basic metabolic panel     Status: Abnormal   Collection Time: 12/19/14  4:13 AM  Result Value Ref Range   Sodium 137 135 - 145 mmol/L    Potassium 3.5 3.5 - 5.1 mmol/L   Chloride 104 101 - 111 mmol/L   CO2 27 22 - 32 mmol/L   Glucose, Bld 98 65 - 99 mg/dL   BUN 9 6 - 20 mg/dL   Creatinine, Ser 0.53  0.44 - 1.00 mg/dL   Calcium 8.7 (L) 8.9 - 10.3 mg/dL   GFR calc non Af Amer >60 >60 mL/min   GFR calc Af Amer >60 >60 mL/min    Comment: (NOTE) The eGFR has been calculated using the CKD EPI equation. This calculation has not been validated in all clinical situations. eGFR's persistently <60 mL/min signify possible Chronic Kidney Disease.    Anion gap 6 5 - 15    Observation Level/Precautions:  15 minute checks  Laboratory:  CBC Chemistry Profile UDS UA  Psychotherapy:    Medications:    Consultations:    Discharge Concerns:    Estimated LOS:  Other:     Psychological Evaluations: No   Treatment Plan Summary: Daily contact with patient to assess and evaluate symptoms and progress in treatment and Medication management  Medical Decision Making:  New problem, with additional work up planned, Review of Psycho-Social Stressors (1), Review or order clinical lab tests (1), Review of Medication Regimen & Side Effects (2) and Review of New Medication or Change in Dosage (2)   Mr. Prisk is a 39 year old female with history of depression, anxiety, and substance use admitted after a suicide attempt by overdose on narcotic painkillers and benzodiazepines.  1. Suicidal ideation. She is no more actively suicidal but rather hopeless about the future unable to contract for safety in the community.  2. Mood. We will continue Celexa and Abilify as in the community. The patient reports improvement in anxiety especially the frequency and the severity of panic attacks with Celexa she still complains of depressive symptoms.  3. Substance use. When under stress she tends to drink and abuse pills. She reportedly overdose on pills that she stole from her housemate. She attends AA and NA meeting some. She does not participate in IOP.  She is not interested in rehabilitation participation.  4. Benzodiazepine and alcohol withdrawal. She was given Librium in the emergency room will continue Librium 25 mg 4 times daily for 3 days.  5. Opiate withdrawal. Complains of nausea diarrhea pains and aches. Will give clonidine 0.1 mg 3 times daily Flexeril 10 mg 3 times daily. Ibuprofen for pain.  6. Smoking. She is allergic to patches Nicorette gum will be substituted with the Nicotrol inhaler.  7. Insomnia. She is on trazodone 100 mg daily.  8. Anxiety. We will continue Minipress 2 mg at bedtime for PTSD type symptoms.  9. Disposition. She already spoken with Sherrian Divers. She will continue with peer supports services and follow up with [facility] for medication management.  I certify that inpatient services furnished can reasonably be expected to improve the patient's condition.   Orson Slick 5/23/20161:34 PM

## 2014-12-20 NOTE — Tx Team (Signed)
Initial Interdisciplinary Treatment Plan   PATIENT STRESSORS: Financial difficulties To find a place to live.   PATIENT STRENGTHS: Barrister's clerkCommunication skills Motivation for treatment/growth   PROBLEM LIST: Problem List/Patient Goals Date to be addressed Date deferred Reason deferred Estimated date of resolution  Major depression 12/19/2014                                                      DISCHARGE CRITERIA:  Ability to meet basic life and health needs Improved stabilization in mood, thinking, and/or behavior Need for constant or close observation no longer present  PRELIMINARY DISCHARGE PLAN: Return to previous living arrangement  PATIENT/FAMIILY INVOLVEMENT: This treatment plan has been presented to and reviewed with the patient, Karin LieuJennifer J Krejci, and/or family member,  The patient and family have been given the opportunity to ask questions and make suggestions.  Margo CommonGigi George Sacred [place] 12/20/2014, 2:53 PM

## 2014-12-20 NOTE — Progress Notes (Signed)
She is anxious and her mood is sad.She is cooperative & brightens on approach.Denies suicidal ideation.Stated that her main stress is to find a place to live.Adressed this to SW.Appropriate with staff & peers.Attended groups.Compliant with meds.
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
## 2014-12-20 NOTE — [facility] Group Notes (Signed)
[facility] Group Notes:  (Nursing/MHT/Case Management/Adjunct)  Date:  12/20/2014  Time:  1:46 PM  Type of Therapy:  Group Therapy  Participation Level:  Active  Participation Quality:  Appropriate  Affect:  Appropriate  Cognitive:  Oriented  Insight:  Appropriate  Engagement in Group:  Engaged  Modes of Intervention:  Discussion  Summary of Progress/Problems:  Sandy Farberamela M Moses Mason 12/20/2014, 1:46 PM

## 2014-12-21 MED ORDER — WHITE PETROLATUM GEL
Status: DC | PRN
  Filled 2014-12-21: qty 5

## 2014-12-21 NOTE — Progress Notes (Signed)
Denies suicidal or homicidal ideation.Still anxious about her living arrangements.Appropriate with staff & peers.Compliant with meds.Attended groups.Appetite good.

## 2014-12-21 NOTE — Progress Notes (Signed)
Patient seen in the milieu with peers. Alert and oriented. Has no concerns at this moment. Staff continue to monitor and to offer support/encouragements. Safety and security maintained.

## 2014-12-21 NOTE — Plan of Care (Signed)
Problem: [facility] Participation in Recreation Therapeutic Interventions Goal: STG-Patient will demonstrate improved self esteem by identif STG: Self-Esteem - Within 5 treatment session, patient will verbalize at least 5 positive affirmation statements in each of 3 treatment sessions to increase self-esteem post d/c.  Outcome: Progressing Treatment Session 1; Completed 1 out of 3: At approximately 10:00 am, LRT met with patient in craft room. Patient verbalized 5 positive affirmation statements. Patient reported it felt "good". LRT encouraged patient to continue saying positive affirmation statements.  Leonette Monarch, LRT/CTRS 05.24.16 11:54 am Goal: STG-Patient will identify at least five coping skills for ** STG: Coping Skills - Within 4 treatment sessions, patient will verbalize at least 5 coping skills for anger in each of 2 treatment sessions to increase anger management skills post d/c.  Outcome: Progressing Treatment Session 1; Completed 1 out of 2: At approximately 10:00 am, LRT met with patient in craft room. Patient verbalized 5 coping skills for anger. Patient verbalized what triggers her to get angry, how her body responds to anger, and how she can remember to use her healthy coping skills. LRT provided suggestions as well. 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 Patient reported treatment session was helpful.  Leonette Monarch, LRT/CTRS 05.24.16 11:56 am

## 2014-12-21 NOTE — Progress Notes (Signed)
Recreation Therapy Notes  Date: 05.24.16 Time: 3:00 pm Location: Craft Room  Group Topic: Goal Setting  Goal Area(s) Addresses:  Patient will write down at least one goal. Patient will write down at least one obstacle.  Behavioral Response: Attentive, Interactive  Intervention: Recovery Goal Chart  Activity: Patients were instructed to make a goal chart listing goals, obstacles, the date they started working on their goals, and the date they achieved their goals.  Education: LRT educated patients on goal setting and why it is important.   Education Outcome: Acknowledges education/In group clarification offered   Clinical Observations/Feedback: Patient completed activity by listing 3 goals, 3 obstacles, and the date she started working on her goals. Patient contributed to group discussion by stating how she can focus on her goals, how she can overcome her obstacles, that she has people in her life to help her stay focused on her goals, and how to celebrate reaching her goals in a healthy way.   Sandy CreeGreene,Sandy Mason, LRT/CTRS 12/21/2014 4:11 PM

## 2014-12-21 NOTE — [facility] Group Notes (Signed)
[facility] Group Notes:  (Nursing/MHT/Case Management/Adjunct)  Date:  12/21/2014  Time:  1:57 PM  Type of Therapy:  Psychoeducational Skills  Participation Level:  Active  Participation Quality:  Appropriate and Attentive  Affect:  Appropriate  Cognitive:  Appropriate  Insight:  Appropriate  Engagement in Group:  Engaged  Modes of Intervention:  Discussion and Education  Summary of Progress/Problems:  Sandy Mason 12/21/2014, 1:57 PM

## 2014-12-21 NOTE — Tx Team (Signed)
Interdisciplinary Treatment Plan Update (Adult)  Date:  12/21/2014 Time Reviewed:  2:50 PM  Progress in Treatment: Attending groups: Yes. Participating in groups:  Yes. Taking medication as prescribed:  Yes. Tolerating medication:  Yes. Family/Significant othe contact made:  No, will contact:  if patient gives consent Patient understands diagnosis:  Yes. Discussing patient identified problems/goals with staff:  Yes. Medical problems stabilized or resolved:  Yes. Denies suicidal/homicidal ideation: No. Issues/concerns per patient self-inventory:  Yes. and As evidenced by:  patient is currently not sure if she can return to living situation Other:  New problem(s) identified: No, Describe:  none reported  Discharge Plan or Barriers: Patient is currently homeless but will continue followup with [facility]  Reason for Continuation of Hospitalization: Depression Suicidal ideation  Comments:  Estimated length of stay: up to 2 days  New goal(s):  Review of initial/current patient goals per problem list:   See Care Plan  Attendees: Patient:   5/24/20162:50 PM  Family:   5/24/20162:50 PM  Physician:  Kristine LineaJolanta Pucilowska, MD 5/24/20162:50 PM  Nursing:    5/24/20162:50 PM  Case Manager:   5/24/20162:50 PM  Counselor:   5/24/20162:50 PM  Other:  Beryl MeagerJason Andreka Stucki, LCSWA 5/24/20162:50 PM  Other:  Delorse LekLynn Washington, LCSW 5/24/20162:50 PM  Other:  Carola FrostLee Ann Roush, Psy D 5/24/20162:50 PM  Other: Hershal CoriaBeth Greene, LRT 5/24/20162:50 PM  Other:  5/24/20162:50 PM  Other:  5/24/20162:50 PM  Other:  5/24/20162:50 PM  Other:  5/24/20162:50 PM  Other:  5/24/20162:50 PM  Other:   5/24/20162:50 PM   Scribe for Treatment Team:   Beryl MeagerIngle, Wallis Vancott T, 12/21/2014, 2:50 PM

## 2014-12-21 NOTE — Progress Notes (Signed)
[employer] AirwaysJennifer pleasant and cooperative.States she had a crying episode earlier cause she worried she may have to go to shelter.States roommate older and need help so hoping it may work out.Interacts well with peers.Smiles at times.Med compliant.Fluids encouraged has gatoraide.Prn for general body aches.No diarrhea reported this shift.Will continue to monitor closely for safety and detox process.

## 2014-12-21 NOTE — Plan of Care (Signed)
Problem: Ineffective individual coping Goal: LTG-Other (Specify)- Outcome: Progressing States she had upsetting news that she may have any where to live.States she feels roommate may need patient's help because she is older.
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
## 2014-12-22 ENCOUNTER — Encounter (INDEPENDENT_AMBULATORY_CARE_PROVIDER_SITE_OTHER): Payer: Self-pay

## 2014-12-22 MED ORDER — VENLAFAXINE HCL ER 75 MG PO CP24
150.0000 mg | ORAL_CAPSULE | Freq: Every day | ORAL | Status: DC
  Administered 2014-12-23: 150 mg via ORAL
  Filled 2014-12-22: qty 2

## 2014-12-22 MED ORDER — VENLAFAXINE HCL ER 75 MG PO CP24
150.0000 mg | ORAL_CAPSULE | Freq: Once | ORAL | Status: AC
  Administered 2014-12-22: 150 mg via ORAL
  Filled 2014-12-22: qty 2

## 2014-12-22 NOTE — Progress Notes (Signed)
Recreation Therapy Notes  Date: 05.25.16 Time: 3:00 pm Location: Craft Room  Group Topic: Self-esteem  Goal Area(s) Addresses:  Patient will be able to identify benefit of self-esteem. Patient will be able to identify ways to increase self-esteem.  Behavioral Response: Attentive, Interactive  Intervention: Self-Portrait  Activity: Patients were instructed to draw their self-portrait. Afterwards, patients were instructed to write something positive about themselves and about their peers. Patients drew their self-portrait again after they read what their peers wrote about them.  Education:LRT educated patients on self-esteem and different ways to increase their self-esteem   Education Outcome: Acknowledges education/In group clarification offered   Clinical Observations/Feedback: Patient drew both self-portraits and wrote something positive about herself and her peers. Patient contributed to group discussion by stating how her faces were different, how she felt after she read what others wrote, and how she can increase her self-esteem.   Jacquelynn CreeGreene,Dhriti Fales M, LRT/CTRS 12/22/2014 4:14 PM

## 2014-12-22 NOTE — Progress Notes (Signed)
[employer] AirwaysJennifer pleasant and cooperative.Med compliant.Denies SI.Interacts well with staff and peers.No problem with detox.States she has a place to live which is with a friend.Plan is to get a job.Educated on AA and NA.States she has only had 1 year of clear time.She feels alcohol is more of her problem than drugs.Prn for generalized body aches.

## 2014-12-22 NOTE — [facility] Group Notes (Signed)
[facility]  12/22/2014 10:59 AM  Participation Quality:  Appropriate  Affect:  Appropriate  Cognitive:  Alert  Insight:  Developing/Improving  Engagement in Group:  Distracting  Modes of Intervention:  Education and Support  Summary of Progress/Problems: Patient interrupted several times today and was gently redirected. She was informative of [facility] in the community to her peers.  Johnella MoloneyBandi, Ronelle Michie M 12/22/2014, 10:59 AM

## 2014-12-22 NOTE — Progress Notes (Signed)
Patient states she is feeling better and is planning to be discharged tomorrow. States she is still having generalized body aches from opiate withdrawal. Affect constricted and mood sad. She is emotionally guarded and has superficially talked about issues that led to her overdosing and relapsing on drugs and alcohol. Denies SI. Continue to monitor.

## 2014-12-22 NOTE — Plan of Care (Signed)
Problem: [facility] Participation in Recreation Therapeutic Interventions Goal: STG-Patient will demonstrate improved self esteem by identif STG: Self-Esteem - Within 5 treatment session, patient will verbalize at least 5 positive affirmation statements in each of 3 treatment sessions to increase self-esteem post d/c.  Outcome: Progressing Treatment Session 2; Completed 2 out of 3: At approximately 12:35 pm, LRT met with patient in patient room. Patient verbalized 5 positive affirmation statements. Patient reported it felt "good". LRT encouraged patient to continue saying positive affirmation statements.  Leonette Monarch, LRT/CTRS 05.25.16 1:28 pm Goal: STG-Patient will identify at least five coping skills for ** STG: Coping Skills - Within 4 treatment sessions, patient will verbalize at least 5 coping skills for anger in each of 2 treatment sessions to increase anger management skills post d/c.  Outcome: Completed/Met Date Met:  12/22/14 Treatment Session 2; Completed 2 out of 2: At approximately 12:35 pm, LRT met with patient in patient room. Patient verbalized 5 coping skills for anger. Patient reported she did not have any questions.  Leonette Monarch, LRT/CTRS 05.25.16 1:30 pm

## 2014-12-22 NOTE — Plan of Care (Signed)
Problem: Ineffective individual coping Goal: STG: Patient will participate in after care plan Outcome: Progressing Sandy Mason has place to live at discharge.
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
## 2014-12-22 NOTE — Plan of Care (Signed)
Problem: Ineffective individual coping Goal: STG-Increase in ability to manage activities of daily living Outcome: Progressing States she feels stronger and able to function better once she is discharged. Acknowledges that she did do some things to care for herself appropriately after last discharge, such as get eye exam and new glasses.

## 2014-12-22 NOTE — [facility] Group Notes (Signed)
[facility] Group Notes:  (Nursing/MHT/Case Management/Adjunct)  Date:  12/22/2014  Time:  10:59 PM  Type of Therapy:  Group Therapy  Participation Level:  Active  Participation Quality:  Attentive, Sharing and Supportive  Affect:  Appropriate  Cognitive:  Appropriate  Insight:  Appropriate  Engagement in Group:  Engaged and Supportive  Modes of Intervention:  Support  Summary of Progress/Problems:  Sandy ReddenOlivia Briana Mason 12/22/2014, 10:59 PM

## 2014-12-23 MED ORDER — ARIPIPRAZOLE 10 MG PO TABS: 5.0000 mg | ORAL_TABLET | Freq: Every day | ORAL | Status: DC

## 2014-12-23 MED ORDER — BUSPIRONE HCL 10 MG PO TABS: 10.0000 mg | ORAL_TABLET | Freq: Three times a day (TID) | ORAL | Status: DC

## 2014-12-23 MED ORDER — VENLAFAXINE HCL ER 150 MG PO CP24: 150.0000 mg | ORAL_CAPSULE | Freq: Every day | ORAL | Status: DC

## 2014-12-23 MED ORDER — PRAZOSIN HCL 1 MG PO CAPS: 2.0000 mg | ORAL_CAPSULE | Freq: Every day | ORAL | Status: DC

## 2014-12-23 MED ORDER — GABAPENTIN 300 MG PO CAPS: 600.0000 mg | ORAL_CAPSULE | Freq: Three times a day (TID) | ORAL | Status: DC

## 2014-12-23 MED ORDER — ARIPIPRAZOLE 10 MG PO TABS: 10.0000 mg | ORAL_TABLET | Freq: Every day | ORAL | Status: DC

## 2014-12-23 MED ORDER — TRAZODONE HCL 100 MG PO TABS: 100.0000 mg | ORAL_TABLET | Freq: Every day | ORAL | Status: DC

## 2014-12-23 NOTE — Progress Notes (Signed)
LCSW preovided patient with addition housing Pharmacist, hospitalresource list and shelter. She reports she will rent a room from a patients brother ( 300.00 a month) LCSW reviewed safety practices and provided [employer] Resource list and rooming house list to patient in case this housing does not work out.

## 2014-12-23 NOTE — [facility] Suicide Risk Assessment (Signed)
[facility] INPATIENT:  Family/Significant Other Suicide Prevention Education  Suicide Prevention Education: Patient was provided 1-1 suicide prevention intervention handout and this material was reviewed with LCSW.-Several additional resources were provided to patient. Education Completed; Duncan DullJose Delgado has been identified by the patient as the family member/significant other with whom the patient will be residing, and identified as the person(s) who will aid the patient in the event of a mental health crisis (suicidal ideations/suicide attempt).  With written consent from the patient, the family member/significant other has been provided the following suicide prevention education, prior to the and/or following the discharge of the patient.  The suicide prevention education provided includes the following:  Suicide risk factors  Suicide prevention and interventions  National Suicide Hotline telephone number  [facility] assessment telephone number  [facility] Emergency Assistance 911  [facility] and/or Residential Mobile Crisis Unit telephone number  Request made of family/significant other to:  Remove weapons (e.g., guns, rifles, knives), all items previously/currently identified as safety concern.    Remove drugs/medications (over-the-counter, prescriptions, illicit drugs), all items previously/currently identified as a safety concern.  The family member/significant other verbalizes understanding of the suicide prevention education information provided.  The family member/significant other agrees to remove the items of safety concern listed above.  Johnella MoloneyBandi, Emmalin Jaquess M 12/23/2014, 8:09 AM

## 2014-12-23 NOTE — Progress Notes (Signed)
[facility] H&P Discharge Summary faced to [facility] for hospital follow-up

## 2014-12-23 NOTE — Progress Notes (Signed)
Recreation Therapy Notes  INPATIENT RECREATION TR PLAN  Patient Details Name: QUISHA MABIE MRN: [id number] DOB: 1975/10/06 Today's Date: 12/23/2014  Rec Therapy Plan Is patient appropriate for Therapeutic Recreation?: Yes Treatment times per week: At least 3 times a week TR Treatment/Interventions: 1:1 session, Group participation (Comment) (Appropriate participation in daily recreation therapy tx)  Discharge Criteria Pt will be discharged from therapy if:: Discharged Treatment plan/goals/alternatives discussed and agreed upon by:: Patient/family  Discharge Summary Short term goals set: See Care Plan Short term goals met: Complete Progress toward goals comments: One-to-one attended Which groups?: Self-esteem, Goal setting, Other (Comment) (Self-expression) One-to-one attended: Self-esteem and coping skills Reason goals not met: N/A Therapeutic equipment acquired: None Reason patient discharged from therapy: Discharge from hospital Pt/family agrees with progress & goals achieved: Yes Date patient discharged from therapy: 12/23/14   Leonette Monarch, LRT/CTRS 12/23/2014, 11:40 AM

## 2014-12-23 NOTE — Progress Notes (Signed)
US AirwaysJennifer pleasant and cooperative.Med compliant.No problem with detox.Interacts well with peers.Good eye contact.Plan is to get a ride with another peer who is leaving in am.Will continue to monitor.

## 2014-12-23 NOTE — Plan of Care (Signed)
Problem: Ineffective Coping Goal: Demonstrates healthy coping skills Outcome: Progressing Plan for healthy coping.
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
## 2014-12-23 NOTE — Progress Notes (Signed)
  [facility] :  Will you be returning to the same living situation after discharge:  No. At discharge, do you have transportation home?: Yes,    Do you have the ability to pay for your medications: Yes/ attached to medication managment  Release of information consent forms completed and in the chart;  Patient's signature needed at discharge.  Patient to Follow up at: Follow-up Information    Follow up with [facility]-Dr Moffit. Go in 1 week.   Why:  Hospital Discharge Follow up Patient will go to walk in clinic and obtain a psychiatric follow up appointment and start her group therapy   Contact information:   [facility] [address] [place] [id number] [phone] fax [phone]      Patient denies SI/HI: yes    Safety Planning and Suicide Prevention discussed: 1-1 support provided patient is not suicidal or homicidal. Suicide prevention handout was provided to patient and reveiwed.     Has patient been referred to the Quitline?: Patient refused referral  Cheron SchaumannBandi, Towana Stenglein M 12/23/2014, 8:10 AM
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
## 2014-12-23 NOTE — [facility] Group Notes (Signed)
[facility] LCSW Group Therapy  12/23/2014 12:08 PM  Type of Therapy:  Group Therapy  Participation Level:  Active  Participation Quality:  Appropriate and Attentive  Affect:  Appropriate  Cognitive:  Alert, Appropriate and Oriented  Insight:  Engaged  Engagement in Therapy:  Engaged  Modes of Intervention:  Discussion, Education, Socialization and Support  Summary of Progress/Problems: Patient attended and participated in group appropriately. Patient wants to bring 'balance' to her life by choosing new coping skills to deal with her negative emotions.   Beryl MeagerIngle, Trevel Dillenbeck T 12/23/2014, 12:08 PM

## 2014-12-23 NOTE — Plan of Care (Signed)
Problem: [facility] Participation in Recreation Therapeutic Interventions Goal: STG-Patient will demonstrate improved self esteem by identif STG: Self-Esteem - Within 5 treatment session, patient will verbalize at least 5 positive affirmation statements in each of 3 treatment sessions to increase self-esteem post d/c.  Outcome: Completed/Met Date Met:  12/23/14 Treatment Session 3; Completed 3 out of 3: At approximately 10:10 am, LRT met with patient in patient room. Patient verbalized 5 positive affirmation statements. Patient reported it felt "good'. LRT encouraged patient to continue saying positive affirmation statements.  Leonette Monarch, LRT/CTRS 05.26.16 11:38 am

## 2014-12-23 NOTE — Progress Notes (Signed)
Pleasant and cooperative.  Denies SI.  Verbalizes that she is still anxious and working on her coping skills. Discharge instructions given, verbalized understanding.  Prescriptions and seven day supply of medications given.  Personal belongings returned.  Escorted off unit by this Clinical research associatewriter to meet friend to travel home.
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
## 2015-03-25 ENCOUNTER — Encounter: Payer: Self-pay | Admitting: *Deleted

## 2015-03-25 ENCOUNTER — Emergency Department
Admission: EM | Admit: 2015-03-25 | Discharge: 2015-03-27 | Disposition: A | Discharge status: Other Acute Inpatient Hospital | Payer: No Typology Code available for payment source | Attending: Emergency Medicine | Admitting: Emergency Medicine

## 2015-03-25 DIAGNOSIS — F329 Major depressive disorder, single episode, unspecified: Secondary | ICD-10-CM | POA: Insufficient documentation

## 2015-03-25 DIAGNOSIS — Z79899 Other long term (current) drug therapy: Secondary | ICD-10-CM | POA: Insufficient documentation

## 2015-03-25 DIAGNOSIS — F32A Depression, unspecified: Secondary | ICD-10-CM

## 2015-03-25 DIAGNOSIS — Z720 Tobacco use: Secondary | ICD-10-CM | POA: Insufficient documentation

## 2015-03-25 DIAGNOSIS — R45851 Suicidal ideations: Secondary | ICD-10-CM

## 2015-03-25 DIAGNOSIS — F1110 Opioid abuse, uncomplicated: Secondary | ICD-10-CM | POA: Insufficient documentation

## 2015-03-25 DIAGNOSIS — Z3202 Encounter for pregnancy test, result negative: Secondary | ICD-10-CM | POA: Insufficient documentation

## 2015-03-25 DIAGNOSIS — F1310 Sedative, hypnotic or anxiolytic abuse, uncomplicated: Secondary | ICD-10-CM | POA: Insufficient documentation

## 2015-03-25 DIAGNOSIS — Z590 Homelessness: Secondary | ICD-10-CM | POA: Insufficient documentation

## 2015-03-25 HISTORY — DX: Anxiety disorder, unspecified: F41.9

## 2015-03-25 HISTORY — DX: Depression, unspecified: F32.A

## 2015-03-25 HISTORY — DX: Major depressive disorder, single episode, unspecified: F32.9

## 2015-03-25 HISTORY — DX: Asphyxiation due to hanging, intentional self-harm, initial encounter: T71.162A

## 2015-03-25 LAB — CBC
HCT: 38.4 % (ref 35.0–47.0)
Hemoglobin: 12.9 g/dL (ref 12.0–16.0)
MCH: 30.8 pg (ref 26.0–34.0)
MCHC: 33.5 g/dL (ref 32.0–36.0)
MCV: 91.8 fL (ref 80.0–100.0)
PLATELETS: 378 10*3/uL (ref 150–440)
RBC: 4.18 MIL/uL (ref 3.80–5.20)
RDW: 12.7 % (ref 11.5–14.5)
WBC: 9.6 10*3/uL (ref 3.6–11.0)

## 2015-03-25 LAB — COMPREHENSIVE METABOLIC PANEL
ALK PHOS: 60 U/L (ref 38–126)
ALT: 45 U/L (ref 14–54)
AST: 100 U/L — ABNORMAL HIGH (ref 15–41)
Albumin: 4 g/dL (ref 3.5–5.0)
Anion gap: 10 (ref 5–15)
BUN: 8 mg/dL (ref 6–20)
CALCIUM: 8.8 mg/dL — AB (ref 8.9–10.3)
CHLORIDE: 99 mmol/L — AB (ref 101–111)
CO2: 27 mmol/L (ref 22–32)
CREATININE: 0.75 mg/dL (ref 0.44–1.00)
GFR calc Af Amer: >60 mL/min (ref >60)
Glucose, Bld: 117 mg/dL — ABNORMAL HIGH (ref 65–99)
Potassium: 3 mmol/L — ABNORMAL LOW (ref 3.5–5.1)
Sodium: 136 mmol/L (ref 135–145)
Total Bilirubin: 0.4 mg/dL (ref 0.3–1.2)
Total Protein: 8.1 g/dL (ref 6.5–8.1)

## 2015-03-25 LAB — ACETAMINOPHEN LEVEL: Acetaminophen (Tylenol), Serum: <10 ug/mL — ABNORMAL LOW (ref 10–30)

## 2015-03-25 LAB — SALICYLATE LEVEL: Salicylate Lvl: <4 mg/dL (ref 2.8–30.0)

## 2015-03-25 LAB — ETHANOL

## 2015-03-25 NOTE — ED Notes (Signed)
Pt describes feelings of hopelessness and desire to "not be here". Pt states she talked to her [facility] counselor and was advised to come to ED. Pt has had change in living circumstances since yesterday and is now homeless. Pt denies plan to harm self. Pt states she has taken oxycodone x 2 tabs recently, was not her rx, last used today at breakfast, denies OD attempt.

## 2015-03-26 LAB — URINE DRUG SCREEN, QUALITATIVE (ARMC ONLY)
AMPHETAMINES, UR SCREEN: NOT DETECTED
BENZODIAZEPINE, UR SCRN: POSITIVE — AB
Barbiturates, Ur Screen: NOT DETECTED
Cannabinoid 50 Ng, Ur ~~LOC~~: NOT DETECTED
Cocaine Metabolite,Ur ~~LOC~~: NOT DETECTED
MDMA (ECSTASY) UR SCREEN: NOT DETECTED
METHADONE SCREEN, URINE: NOT DETECTED
Opiate, Ur Screen: POSITIVE — AB
Phencyclidine (PCP) Ur S: NOT DETECTED
Tricyclic, Ur Screen: NOT DETECTED

## 2015-03-26 MED ORDER — POTASSIUM CHLORIDE 20 MEQ/15ML (10%) PO SOLN
40.0000 meq | Freq: Once | ORAL | Status: AC
  Administered 2015-03-26: 40 meq via ORAL
  Filled 2015-03-26 (×2): qty 30

## 2015-03-26 MED ORDER — ARIPIPRAZOLE 10 MG PO TABS
20.0000 mg | ORAL_TABLET | Freq: Every day | ORAL | Status: DC
  Administered 2015-03-26: 20 mg via ORAL

## 2015-03-26 MED ORDER — NICOTINE 10 MG IN INHA
1.0000 | RESPIRATORY_TRACT | Status: DC | PRN
  Administered 2015-03-26 (×2): 1 via RESPIRATORY_TRACT

## 2015-03-26 MED ORDER — TRAZODONE HCL 50 MG PO TABS: 50.0000 mg | ORAL_TABLET | Freq: Once | ORAL | Status: DC

## 2015-03-26 MED ORDER — GABAPENTIN 300 MG PO CAPS
300.0000 mg | ORAL_CAPSULE | Freq: Once | ORAL | Status: AC
  Administered 2015-03-26: 300 mg via ORAL
  Filled 2015-03-26: qty 1

## 2015-03-26 MED ORDER — TRAZODONE HCL 50 MG PO TABS
50.0000 mg | ORAL_TABLET | Freq: Once | ORAL | Status: AC
  Administered 2015-03-26: 50 mg via ORAL
  Filled 2015-03-26: qty 1

## 2015-03-26 MED ORDER — VENLAFAXINE HCL ER 75 MG PO CP24: 150.0000 mg | ORAL_CAPSULE | Freq: Every morning | ORAL | Status: DC

## 2015-03-26 NOTE — ED Notes (Signed)
Snack and drink provided to pt.  

## 2015-03-26 NOTE — ED Notes (Signed)
BEHAVIORAL HEALTH ROUNDING Patient sleeping: No. Patient alert and oriented: yes Behavior appropriate: Yes.  ;  Nutrition and fluids offered: Yes  Toileting and hygiene offered: Yes  Sitter present: no Law enforcement present: Yes   

## 2015-03-26 NOTE — ED Notes (Signed)
ENVIRONMENTAL ASSESSMENT  Potentially harmful objects out of patient reach: Yes.  Personal belongings secured: Yes.  Patient dressed in hospital provided attire only: Yes.  Plastic bags out of patient reach: Yes.  Patient care equipment (cords, cables, call bells, lines, and drains) shortened, removed, or accounted for: Yes.  Equipment and supplies removed from bottom of stretcher: Yes.  Potentially toxic materials out of patient reach: Yes.  Sharps container removed or out of patient reach: Yes.   BEHAVIORAL HEALTH ROUNDING  Patient sleeping: No.  Patient alert and oriented: yes  Behavior appropriate: Yes. ; If no, describe:  Nutrition and fluids offered: Yes  Toileting and hygiene offered: Yes  Sitter present: not applicable  Law enforcement present: Yes [employer]  Pt brought into ED BHU via sally port and wand with metal detector for safety by [employer] officer. Patient oriented to unit/care area: Pt informed of unit policies and procedures.  Informed that, for their safety, care areas are designed for safety and monitored by security cameras at all times; and visiting hours explained to patient. Patient verbalizes understanding, and verbal contract for safety obtained.Pt shown to their room.  

## 2015-03-26 NOTE — ED Notes (Signed)
Pt resting. Easily awakens. Pt informed breakfast tray to bedside.

## 2015-03-26 NOTE — ED Notes (Signed)
BEHAVIORAL HEALTH ROUNDING Patient sleeping: No. Patient alert and oriented: yes Behavior appropriate: Yes.  ; If no, describe:   Nutrition and fluids offered: Yes  Toileting and hygiene offered: Yes  Sitter present: yes Law enforcement present: Yes  and ODS   ENVIRONMENTAL ASSESSMENT Potentially harmful objects out of patient reach: Yes.   Personal belongings secured: Yes.   Patient dressed in hospital provided attire only: Yes.   Plastic bags out of patient reach: Yes.   Patient care equipment (cords, cables, call bells, lines, and drains) shortened, removed, or accounted for: Yes.   Equipment and supplies removed from bottom of stretcher: Yes.   Potentially toxic materials out of patient reach: Yes.   Sharps container removed or out of patient reach: Yes.

## 2015-03-26 NOTE — ED Provider Notes (Signed)
-----------------------------------------   7:55 PM on 03/26/2015 -----------------------------------------  This 39 year old female who presented with suicidal ideation has been evaluated by psychiatry. She has been seen by Dr. Guss Bunde and Dr. Guss Bunde will admit the patient to [facility] for ongoing care.  Darien Ramus, MD 03/26/15 [phone]

## 2015-03-26 NOTE — BHH Counselor (Signed)
Authorization Request for Psych Inpt. Treatment from [employer] completed and submitted. TAR# [id number].

## 2015-03-26 NOTE — ED Notes (Signed)
BEHAVIORAL HEALTH ROUNDING Patient sleeping: Yes.   Patient alert and oriented: not applicable SLEEPING Behavior appropriate: Yes.  ; If no, describe: SLEEPING Nutrition and fluids offered: No SLEEPING Toileting and hygiene offered: NoSLEEPING Sitter present: not applicable Law enforcement present: Yes ODS 

## 2015-03-26 NOTE — ED Notes (Signed)
BEHAVIORAL HEALTH ROUNDING Patient sleeping: Yes.   Patient alert and oriented: not applicable Behavior appropriate: Yes.  ; If no, describe:   Nutrition and fluids offered: No Toileting and hygiene offered: No Sitter present: no Law enforcement present: Yes  and ODS    

## 2015-03-26 NOTE — ED Notes (Signed)
Report received from [place], [employer], Intake Specialist, called for order of [facility] [place] admission. Per Intake [facility] Juanita Laster has had two call outs and will not receive patients tonight.  Pt notified.

## 2015-03-26 NOTE — ED Notes (Signed)
Discontinue 1 to 1 sitter Danelle Earthly RN) start q15 checks

## 2015-03-26 NOTE — ED Notes (Addendum)
Pt in shower at this time

## 2015-03-26 NOTE — ED Notes (Signed)
BEHAVIORAL HEALTH ROUNDING Patient sleeping: No. Patient alert and oriented: yes Behavior appropriate: Yes.  ; If no, describe:   Nutrition and fluids offered: Yes  Toileting and hygiene offered: Yes  Sitter present: no Law enforcement present: Yes  and ODS  
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
## 2015-03-26 NOTE — ED Notes (Addendum)
ED BHU PLACEMENT JUSTIFICATION Is the patient under IVC or is there intent for IVC: No. Is the patient medically cleared: Yes.   Is there vacancy in the ED BHU: Yes.   Is the population mix appropriate for patient: Yes.   Is the patient awaiting placement in inpatient or outpatient setting: No. Has the patient had a psychiatric consult: No. Survey of unit performed for contraband, proper placement and condition of furniture, tampering with fixtures in bathroom, shower, and each patient room: Yes.  ; APPEARANCE/BEHAVIOR calm, cooperative and adequate rapport can be established NEURO ASSESSMENT Orientation: time, place and person Hallucinations: No.None noted (Hallucinations) Speech: Normal Gait: normal RESPIRATORY ASSESSMENT Breathing Pattern: wnl CARDIOVASCULAR ASSESSMENT Pulse regular  GASTROINTESTINAL ASSESSMENT wnl EXTREMITIES normal strength, tone, and muscle mass PLAN OF CARE Provide calm/safe environment. Vital signs assessed twice daily. ED BHU Assessment once each 12-hour shift. Collaborate with intake RN daily or as condition indicates. Assure the ED provider has rounded once each shift. Provide and encourage hygiene. Provide redirection as needed. Assess for escalating behavior; address immediately and inform ED provider.  Assess family dynamic and appropriateness for visitation as needed: Yes.  ; If necessary, describe findings: none Educate the patient/family about BHU procedures/visitation: Yes.  ; If necessary, describe findings: none

## 2015-03-26 NOTE — ED Provider Notes (Signed)
[facility] Emergency Department Provider Note  ____________________________________________  Time seen: 1:00 AM  I have reviewed the triage vital signs and the nursing notes.   HISTORY  Chief Complaint Suicidal     HPI Sandy Mason is a 39 y.o. female presents with suicidal ideation 1 day. Patient states that she feels an overwhelming sense of hopelessness secondary to being homeless.      Past Medical History  Diagnosis Date  . CVA (cerebral infarction)   . Seizures   . Depression   . Anxiety   . Suicide attempt by hanging     Patient Active Problem List   Diagnosis Date Noted  . Opioid use disorder, severe, dependence 12/20/2014  . 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 Alcohol use disorder severe. 12/18/2014  . Alcohol withdrawal 12/18/2014  . Benzodiazepine use disorder severe. 12/18/2014  . Borderline personality disorder 12/18/2014  . Drug overdose, intentional 12/17/2014    History reviewed. No pertinent past surgical history.  Current Outpatient Rx  Name  Route  Sig  Dispense  Refill  . ARIPiprazole (ABILIFY) 10 MG tablet   Oral   Take 1 tablet (10 mg total) by mouth daily.   30 tablet   0   . busPIRone (BUSPAR) 10 MG tablet   Oral   Take 1 tablet (10 mg total) by mouth 3 (three) times daily.   90 tablet   0   . citalopram (CELEXA) 20 MG tablet   Oral   Take 40 mg by mouth daily.         Marland Kitchen gabapentin (NEURONTIN) 300 MG capsule   Oral   Take 2 capsules (600 mg total) by mouth 3 (three) times daily.   180 capsule   0   . GARCINIA CAMBOGIA-CHROMIUM PO   Oral   Take 1 capsule by mouth daily.         . mirtazapine (REMERON) 30 MG tablet   Oral   Take 30 mg by mouth at bedtime.         . prazosin (MINIPRESS) 1 MG capsule   Oral   Take 2 capsules (2 mg total) by mouth at bedtime.   60 capsule   0   . traZODone (DESYREL) 100 MG tablet   Oral   Take 1  tablet (100 mg total) by mouth at bedtime.   30 tablet   0   . venlafaxine XR (EFFEXOR-XR) 150 MG 24 hr capsule   Oral   Take 1 capsule (150 mg total) by mouth daily with breakfast.   30 capsule   0   . naproxen sodium (ANAPROX) 220 MG tablet   Oral   Take 220 mg by mouth 2 (two) times daily as needed (for pain).         . vitamin B-12 (CYANOCOBALAMIN) 1000 MCG tablet   Oral   Take 1,000 mcg by mouth daily.           Allergies Depakote; Haldol; Nicoderm; and Keflet  History reviewed. No pertinent family history.  Social History Social History  Substance Use Topics  . Smoking status: Current Every Day Smoker  . Smokeless tobacco: None  . Alcohol Use: 3.6 oz/week    6 Cans of beer per week     Comment: last used 6 months ago    Review of Systems  Constitutional: Negative for fever. Eyes: Negative for visual changes. ENT: Negative for sore throat. Cardiovascular: Negative for chest pain. Respiratory:  Negative for shortness of breath. Gastrointestinal: Negative for abdominal pain, vomiting and diarrhea. Genitourinary: Negative for dysuria. Musculoskeletal: Negative for back pain. Skin: Negative for rash. Neurological: Negative for headaches, focal weakness or numbness. Psychiatric: Positive for depression and suicidal ideation  10-point ROS otherwise negative.  ____________________________________________   PHYSICAL EXAM:  VITAL SIGNS: ED Triage Vitals  Enc Vitals Group     BP 03/25/15 2043 108/66 mmHg     Pulse Rate 03/25/15 2043 106     Resp 03/25/15 2043 18     Temp 03/25/15 2043 98.5 F (36.9 C)     Temp Source 03/25/15 2043 Oral     SpO2 03/25/15 2043 97 %     Weight 03/25/15 2043 210 lb (95.255 kg)     Height 03/25/15 2043 5\' 8"  (1.727 m)     Head Cir --      Peak Flow --      Pain Score 03/25/15 2053 10     Pain Loc --      Pain Edu? --      Excl. in GC? --      Constitutional: Alert and oriented. Well appearing and in no  distress. Eyes: Conjunctivae are normal. PERRL. Normal extraocular movements. ENT   Head: Normocephalic and atraumatic.   Nose: No congestion/rhinnorhea.   Mouth/Throat: Mucous membranes are moist.   Neck: No stridor. Hematological/Lymphatic/Immunilogical: No cervical lymphadenopathy. Cardiovascular: Normal rate, regular rhythm. Normal and symmetric distal pulses are present in all extremities. No murmurs, rubs, or gallops. Respiratory: Normal respiratory effort without tachypnea nor retractions. Breath sounds are clear and equal bilaterally. No wheezes/rales/rhonchi. Gastrointestinal: Soft and nontender. No distention. There is no CVA tenderness. Genitourinary: deferred Musculoskeletal: Nontender with normal range of motion in all extremities. No joint effusions.  No lower extremity tenderness nor edema. Neurologic:  Normal speech and language. No gross focal neurologic deficits are appreciated. Speech is normal.  Skin:  Skin is warm, dry and intact. No rash noted. Psychiatric: Mood and affect are normal. Speech and behavior are normal. Patient exhibits appropriate insight and judgment.  ____________________________________________    LABS (pertinent positives/negatives)  Labs Reviewed  COMPREHENSIVE METABOLIC PANEL - Abnormal; Notable for the following:    Potassium 3.0 (*)    Chloride 99 (*)    Glucose, Bld 117 (*)    Calcium 8.8 (*)    AST 100 (*)    All other components within normal limits  ACETAMINOPHEN LEVEL - Abnormal; Notable for the following:    Acetaminophen (Tylenol), Serum <10 (*)    All other components within normal limits  URINE DRUG SCREEN, QUALITATIVE ([facility] ONLY) - Abnormal; Notable for the following:    Opiate, Ur Screen POSITIVE (*)    Benzodiazepine, Ur Scrn POSITIVE (*)    All other components within normal limits  ETHANOL  SALICYLATE LEVEL  CBC  POC URINE PREG, ED          INITIAL IMPRESSION / ASSESSMENT AND PLAN / ED  COURSE  Pertinent labs & imaging results that were available during my care of the patient were reviewed by me and considered in my medical decision making (see chart for details).    ____________________________________________   FINAL CLINICAL IMPRESSION(S) / ED DIAGNOSES  Final diagnoses:  Depression  Suicidal ideation      Darci Current, MD 03/26/15 [phone]

## 2015-03-26 NOTE — ED Notes (Signed)
Pt to toilet, to NS for water,  and returned to bed

## 2015-03-26 NOTE — ED Notes (Signed)

## 2015-03-26 NOTE — ED Notes (Signed)
Challa, MD at bedside at this time.

## 2015-03-26 NOTE — BHH Counselor (Signed)
Pt. is to be admitted to [facility] by Dr. Challa. Attending Physician will be Dr. Hernandez.  Pt. has been assigned to room 306A, by BHH Charge Nurse Cliff, RD.  Intake Paper Work has been signed and placed on pt. chart. ER staff ( [facility]; [facility] Patient's Nurse & [place] Patient Access) have been made aware of the admission. 

## 2015-03-26 NOTE — ED Notes (Signed)
Pt sitting up in bed eating dinner at this time.  

## 2015-03-26 NOTE — ED Notes (Signed)
BEHAVIORAL HEALTH ROUNDING Patient sleeping: Yes.   Patient alert and oriented: yes Behavior appropriate: Yes.  ;  Nutrition and fluids offered: Yes  Toileting and hygiene offered: Yes  Sitter present: no Law enforcement present: Yes  

## 2015-03-26 NOTE — ED Notes (Signed)
Pt requesting nicotine inhaler. Carollee Massed, MD informed new orders received.
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
## 2015-03-26 NOTE — ED Notes (Signed)
POC PREG RESULTS: NEGATIVE, Dr Manson Passey notified POC results not crossing over

## 2015-03-26 NOTE — ED Notes (Signed)
BEHAVIORAL HEALTH ROUNDING Patient sleeping: Yes.   Patient alert and oriented: yes Behavior appropriate: Yes.  ;  Nutrition and fluids offered: Yes  Toileting and hygiene offered: Yes  Sitter present: yes Law enforcement present: Yes  

## 2015-03-26 NOTE — ED Notes (Signed)
ED BHU PLACEMENT JUSTIFICATION Is the patient under IVC or is there intent for IVC: No.   Is the patient medically cleared: Yes.   Is there vacancy in the ED BHU: Yes.   Is the population mix appropriate for patient: Yes.   Is the patient awaiting placement in inpatient or outpatient setting: Yes.   Has the patient had a psychiatric consult: Yes.   Survey of unit performed for contraband, proper placement and condition of furniture, tampering with fixtures in bathroom, shower, and each patient room: Yes.  ; Findings: all clear APPEARANCE/BEHAVIOR calm, cooperative and adequate rapport can be established NEURO ASSESSMENT Orientation: time, place and person Hallucinations: No.None noted (Hallucinations) Speech: Normal Gait: normal RESPIRATORY ASSESSMENT Normal expansion.  Clear to auscultation.  No rales, rhonchi, or wheezing. CARDIOVASCULAR ASSESSMENT regular rate and rhythm, S1, S2 normal, no murmur, click, rub or gallop GASTROINTESTINAL ASSESSMENT soft, nontender, BS WNL, no r/g EXTREMITIES normal strength, tone, and muscle mass PLAN OF CARE Provide calm/safe environment. Vital signs assessed twice daily. ED BHU Assessment once each 12-hour shift. Collaborate with intake RN daily or as condition indicates. Assure the ED provider has rounded once each shift. Provide and encourage hygiene. Provide redirection as needed. Assess for escalating behavior; address immediately and inform ED provider.  Assess family dynamic and appropriateness for visitation as needed: No.; If necessary, describe findings:   Educate the patient/family about BHU procedures/visitation: Yes.  ; If necessary, describe findings:    BEHAVIORAL HEALTH ROUNDING Patient sleeping: No. Patient alert and oriented: yes Behavior appropriate: Yes.  ; If no, describe:   Nutrition and fluids offered: Yes  Toileting and hygiene offered: Yes  Sitter present: no Law enforcement present: Yes  and [employer]  ENVIRONMENTAL  ASSESSMENT Potentially harmful objects out of patient reach: Yes.   Personal belongings secured: Yes.   Patient dressed in hospital provided attire only: Yes.   Plastic bags out of patient reach: Yes.   Patient care equipment (cords, cables, call bells, lines, and drains) shortened, removed, or accounted for: Yes.   Equipment and supplies removed from bottom of stretcher: Yes.   Potentially toxic materials out of patient reach: Yes.   Sharps container removed or out of patient reach: Yes.

## 2015-03-27 ENCOUNTER — Inpatient Hospital Stay
Admission: EM | Admit: 2015-03-27 | Discharge: 2015-03-29 | DRG: 885 | Disposition: A | Discharge status: Home / Self Care | Payer: No Typology Code available for payment source | Source: Intra-hospital | Attending: Psychiatry | Admitting: Psychiatry

## 2015-03-27 ENCOUNTER — Encounter: Payer: Self-pay | Admitting: Behavioral Health

## 2015-03-27 DIAGNOSIS — K5900 Constipation, unspecified: Secondary | ICD-10-CM | POA: Diagnosis present

## 2015-03-27 DIAGNOSIS — F1123 Opioid dependence with withdrawal: Secondary | ICD-10-CM | POA: Diagnosis present

## 2015-03-27 DIAGNOSIS — F332 Major depressive disorder, recurrent severe without psychotic features: Secondary | ICD-10-CM | POA: Diagnosis present

## 2015-03-27 DIAGNOSIS — F13239 Sedative, hypnotic or anxiolytic dependence with withdrawal, unspecified: Secondary | ICD-10-CM | POA: Diagnosis present

## 2015-03-27 DIAGNOSIS — F1590 Other stimulant use, unspecified, uncomplicated: Secondary | ICD-10-CM

## 2015-03-27 DIAGNOSIS — F17210 Nicotine dependence, cigarettes, uncomplicated: Secondary | ICD-10-CM | POA: Diagnosis present

## 2015-03-27 DIAGNOSIS — F419 Anxiety disorder, unspecified: Secondary | ICD-10-CM | POA: Diagnosis present

## 2015-03-27 DIAGNOSIS — F603 Borderline personality disorder: Secondary | ICD-10-CM | POA: Diagnosis present

## 2015-03-27 DIAGNOSIS — F1390 Sedative, hypnotic, or anxiolytic use, unspecified, uncomplicated: Secondary | ICD-10-CM | POA: Diagnosis present

## 2015-03-27 DIAGNOSIS — Z915 Personal history of self-harm: Secondary | ICD-10-CM | POA: Diagnosis not present

## 2015-03-27 DIAGNOSIS — F17200 Nicotine dependence, unspecified, uncomplicated: Secondary | ICD-10-CM

## 2015-03-27 DIAGNOSIS — R45851 Suicidal ideations: Secondary | ICD-10-CM | POA: Diagnosis present

## 2015-03-27 DIAGNOSIS — G4700 Insomnia, unspecified: Secondary | ICD-10-CM | POA: Diagnosis present

## 2015-03-27 DIAGNOSIS — Z9119 Patient's noncompliance with other medical treatment and regimen: Secondary | ICD-10-CM | POA: Diagnosis present

## 2015-03-27 DIAGNOSIS — F609 Personality disorder, unspecified: Secondary | ICD-10-CM | POA: Diagnosis present

## 2015-03-27 DIAGNOSIS — F10239 Alcohol dependence with withdrawal, unspecified: Secondary | ICD-10-CM | POA: Diagnosis present

## 2015-03-27 DIAGNOSIS — F1320 Sedative, hypnotic or anxiolytic dependence, uncomplicated: Secondary | ICD-10-CM

## 2015-03-27 DIAGNOSIS — F319 Bipolar disorder, unspecified: Secondary | ICD-10-CM | POA: Diagnosis present

## 2015-03-27 DIAGNOSIS — F1120 Opioid dependence, uncomplicated: Secondary | ICD-10-CM | POA: Diagnosis present

## 2015-03-27 DIAGNOSIS — F4310 Post-traumatic stress disorder, unspecified: Secondary | ICD-10-CM

## 2015-03-27 DIAGNOSIS — Z590 Homelessness: Secondary | ICD-10-CM | POA: Diagnosis not present

## 2015-03-27 DIAGNOSIS — Z8673 Personal history of transient ischemic attack (TIA), and cerebral infarction without residual deficits: Secondary | ICD-10-CM | POA: Diagnosis not present

## 2015-03-27 DIAGNOSIS — F1020 Alcohol dependence, uncomplicated: Secondary | ICD-10-CM | POA: Diagnosis present

## 2015-03-27 HISTORY — DX: Sedative, hypnotic or anxiolytic dependence, uncomplicated: F13.20

## 2015-03-27 LAB — POTASSIUM: POTASSIUM: 4.6 mmol/L (ref 3.5–5.1)

## 2015-03-27 MED ORDER — MAGNESIUM HYDROXIDE 400 MG/5ML PO SUSP: 30.0000 mL | Freq: Every day | ORAL | Status: DC | PRN

## 2015-03-27 MED ORDER — ARIPIPRAZOLE 10 MG PO TABS
10.0000 mg | ORAL_TABLET | Freq: Every day | ORAL | Status: DC
  Administered 2015-03-27 – 2015-03-29 (×3): 10 mg via ORAL
  Filled 2015-03-27 (×3): qty 1

## 2015-03-27 MED ORDER — VENLAFAXINE HCL ER 75 MG PO CP24
150.0000 mg | ORAL_CAPSULE | Freq: Every day | ORAL | Status: DC
  Administered 2015-03-28 – 2015-03-29 (×2): 150 mg via ORAL
  Filled 2015-03-27 (×2): qty 2

## 2015-03-27 MED ORDER — MIRTAZAPINE 15 MG PO TABS
15.0000 mg | ORAL_TABLET | Freq: Every day | ORAL | Status: DC
  Administered 2015-03-27 – 2015-03-28 (×2): 15 mg via ORAL
  Filled 2015-03-27 (×2): qty 1

## 2015-03-27 MED ORDER — CHLORDIAZEPOXIDE HCL 5 MG PO CAPS
5.0000 mg | ORAL_CAPSULE | Freq: Four times a day (QID) | ORAL | Status: DC
  Administered 2015-03-27 – 2015-03-29 (×7): 5 mg via ORAL
  Filled 2015-03-27 (×7): qty 1

## 2015-03-27 MED ORDER — INFLUENZA VAC SPLIT QUAD 0.5 ML IM SUSY
0.5000 mL | PREFILLED_SYRINGE | INTRAMUSCULAR | Status: AC
  Administered 2015-03-28: 0.5 mL via INTRAMUSCULAR
  Filled 2015-03-27: qty 0.5

## 2015-03-27 MED ORDER — PRAZOSIN HCL 2 MG PO CAPS
2.0000 mg | ORAL_CAPSULE | Freq: Every day | ORAL | Status: DC
  Administered 2015-03-27 – 2015-03-28 (×3): 2 mg via ORAL
  Filled 2015-03-27 (×3): qty 1

## 2015-03-27 MED ORDER — PNEUMOCOCCAL VAC POLYVALENT 25 MCG/0.5ML IJ INJ
0.5000 mL | INJECTION | INTRAMUSCULAR | Status: AC
  Administered 2015-03-28: 0.5 mL via INTRAMUSCULAR
  Filled 2015-03-27: qty 0.5

## 2015-03-27 MED ORDER — NICOTINE 10 MG IN INHA: 1.0000 | RESPIRATORY_TRACT | Status: DC | PRN

## 2015-03-27 MED ORDER — ALUM & MAG HYDROXIDE-SIMETH 200-200-20 MG/5ML PO SUSP: 30.0000 mL | ORAL | Status: DC | PRN

## 2015-03-27 MED ORDER — TRAZODONE HCL 100 MG PO TABS
100.0000 mg | ORAL_TABLET | Freq: Every evening | ORAL | Status: DC | PRN
  Administered 2015-03-27 – 2015-03-28 (×2): 100 mg via ORAL
  Filled 2015-03-27 (×3): qty 1

## 2015-03-27 MED ORDER — ACETAMINOPHEN 325 MG PO TABS: 650.0000 mg | ORAL_TABLET | Freq: Four times a day (QID) | ORAL | Status: DC | PRN
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
## 2015-03-27 NOTE — Progress Notes (Signed)
Calm and cooperative today. Mild anxiety in the morning which was relieved after medication received. She denies suicidal ideation denies thoughts to self harm. ADL's and appetite fair. She does not attend groups, isolated in her room the majority of the day.  No psychical complaints.

## 2015-03-27 NOTE — ED Notes (Signed)
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
## 2015-03-27 NOTE — BHH Group Notes (Signed)
BHH LCSW Group Therapy  03/27/2015 4:18 PM  Type of Therapy: Group Therapy  Participation Level: Did Not Attend  Modes of Intervention: Discussion, Education, Socialization and Support  Summary of Progress/Problems: Emotional Regulation: Patients will identify both negative and positive emotions. They will discuss emotions they have difficulty regulating and how they impact their lives. Patients will be asked to identify healthy coping skills to combat unhealthy reactions to negative emotions.   Faith Patricelli L Reynald Woods MSW, LCSWA  03/27/2015, 4:18 PM 
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
## 2015-03-27 NOTE — ED Notes (Signed)
Report to Minerva Areola, RN in [facility]. Med.

## 2015-03-27 NOTE — BHH Counselor (Signed)
Received phone call from [facility] (Katie [phone]) about the patient, requesting an update how she was doing. Information was provided. Writer was advised to call back tomorrow (03/28/2015), due to patient may have a possible bed with their facility. Writer informed the  Unit Social Worker(Candance) of the call.
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
## 2015-03-27 NOTE — Progress Notes (Signed)
D: Pt denies SI/HI/AVH. Pt is pleasant and cooperative. Pt stayed in bed a lot of the evening. Pt pre-occupied with her medications. Pt stated she thinks they are right now. Pt said the shelter would not let her in, so she became suicidal because she had no where to go. Pt stated she was feeling sad  " I've been better", pt would like to find a group home to live in upon D/C, pt says she receives services at [facility].   A: Pt was offered support and encouragement. Pt was given scheduled medications. Pt was encourage to attend groups. Q 15 minute checks were done for safety.  R:Pt attends groups and interacts well with peers and staff. Pt is taking medication. Pt has no complaints.Pt receptive to treatment and safety maintained on unit.
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
## 2015-03-27 NOTE — ED Notes (Signed)
BEHAVIORAL HEALTH ROUNDING  Patient sleeping: No.  Patient alert and oriented: yes  Behavior appropriate: Yes. ; If no, describe:  Nutrition and fluids offered: Yes  Toileting and hygiene offered: Yes  Sitter present: not applicable  Law enforcement present: Yes ODS  

## 2015-03-27 NOTE — Tx Team (Signed)
Initial Interdisciplinary Treatment Plan   PATIENT STRESSORS: Financial difficulties Occupational concerns Substance abuse Traumatic event   PATIENT STRENGTHS: Ability for insight Active sense of humor Average or above average intelligence General fund of knowledge Motivation for treatment/growth Physical Health   PROBLEM LIST: Problem List/Patient Goals Date to be addressed Date deferred Reason deferred Estimated date of resolution  Homelessness 03/27/2015   04/03/2015  Suicidality 03/27/2015   04/03/2015  Depression 03/27/2015   04/03/2015  Substance Abuse 03/27/2015   04/03/2015                                 DISCHARGE CRITERIA:  Adequate post-discharge living arrangements Improved stabilization in mood, thinking, and/or behavior Need for constant or close observation no longer present Reduction of life-threatening or endangering symptoms to within safe limits Verbal commitment to aftercare and medication compliance Withdrawal symptoms are absent or subacute and managed without 24-hour nursing intervention  PRELIMINARY DISCHARGE PLAN: Attend 12-step recovery group Outpatient therapy Placement in alternative living arrangements  PATIENT/FAMIILY INVOLVEMENT: This treatment plan has been presented to and reviewed with the patient, KEYONA EMRICH, and/or family member.  The patient and family have been given the opportunity to ask questions and make suggestions.  Jamis Kryder Shari Prows 03/27/2015, 2:41 AM

## 2015-03-28 DIAGNOSIS — F4310 Post-traumatic stress disorder, unspecified: Secondary | ICD-10-CM

## 2015-03-28 DIAGNOSIS — F1590 Other stimulant use, unspecified, uncomplicated: Secondary | ICD-10-CM

## 2015-03-28 DIAGNOSIS — F17200 Nicotine dependence, unspecified, uncomplicated: Secondary | ICD-10-CM

## 2015-03-28 HISTORY — DX: Post-traumatic stress disorder, unspecified: F43.10

## 2015-03-28 MED ORDER — POLYETHYLENE GLYCOL 3350 17 G PO PACK
17.0000 g | PACK | Freq: Every day | ORAL | Status: DC
  Administered 2015-03-28 – 2015-03-29 (×2): 17 g via ORAL
  Filled 2015-03-28 (×2): qty 1

## 2015-03-28 NOTE — Progress Notes (Signed)
In consultation with patient she is a voluntary and agreeable to go to [facility] for Treatment and rehabilitation services/  in discovery patient has no finances, no home, no job, no insurance or family at this time.  LCSW consulted manager Wilford Grist and she placed the application form to [employer]. This LCSW called and left a detailed message for Damian Leavell to call back.  LCSW to call and  write out Taxi voucher for [employer] and to be placed in patient binder.

## 2015-03-28 NOTE — Progress Notes (Signed)
LCSW received call from Dr Ardyth Harps- Bed is available for patient at [facility], called [facility] to confirm this. She is calling [facility] to make those arrangements.

## 2015-03-28 NOTE — Progress Notes (Signed)
Recreation Therapy Notes  Date: 08.29.16 Time: 3:00 pm Location: Craft Room  Group Topic: Self-expression  Goal Area(s) Addresses:  Patient will be able to identify a color that represents each emotion. Patient will verbalize why it is important to express emotions.  Behavioral Response: Did not attend  Intervention: The Colors Within Me  Activity: Patients were given a blank face worksheet and instructed to analyze what emotions they were feeling, pick a color for each emotion, and show how much of that emotion they felt on the face.   Education: LRT educated patients on different forms of self-expression.  Education Outcome: Patient did not attend group.  Clinical Observations/Feedback: Patient did not attend group.  Jacquelynn Cree, LRT/CTRS 03/28/2015 4:31 PM

## 2015-03-28 NOTE — BHH Group Notes (Signed)
BHH Group Notes:  (Nursing/MHT/Case Management/Adjunct)  Date:  03/28/2015  Time:  4:34 PM  Type of Therapy:  Group Therapy  Participation Level:  Did Not Attend  Participation Qual  Sandy Mason 03/28/2015, 4:34 PM

## 2015-03-28 NOTE — BHH Group Notes (Signed)
BHH LCSW Group Therapy  03/28/2015 4:00 PM  Type of Therapy:  Group Therapy  Participation Level:  Did Not Attend  Participation Quality:    Affect:    Cognitive:    Insight:    Engagement in Therapy:    Modes of Intervention:    Summary of Progress/Problems:  Cheron Schaumann 03/28/2015, 4:00 PM

## 2015-03-28 NOTE — BHH Group Notes (Signed)
[facility] LCSW Aftercare Discharge Planning Group Note  03/28/2015 4:01 PM  Participation Quality:  DID NOT attend  Affect:    Cognitive:    Insight:    Engagement in Group:    Modes of Intervention:    Summary of Progress/Problems:  Sandy Mason 03/28/2015, 4:01 PM
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
## 2015-03-28 NOTE — Progress Notes (Signed)
D: Patient stated sleptfairlast night .Stated appetite is poor and energy level  Is low.  Stated concentration is poor . Stated on Depression scale 8 , hopeless 10 and anxiety 5. ( low 0-10 high) Denies suicidal  homicidal ideations  .  No auditory hallucinations  No pain concerns . Appropriate ADL'S. Interacting with peers and staff.  Voice of nervous and constipation. Requesting neurontin.   A: Encourage patient participation with unit programming . Instruction  Given on  Medication , verbalize understanding. R: Voice no other concerns. Staff continue to monitor

## 2015-03-28 NOTE — Plan of Care (Signed)
Problem: Alteration in mood & ability to function due to Goal: LTG-Pt reports reduction in suicidal thoughts (Patient reports reduction in suicidal thoughts and is able to verbalize a safety plan for whenever patient is feeling suicidal) Outcome: Progressing Denies suicidal ideations     

## 2015-03-28 NOTE — [facility] Counselor (Signed)
Received a call from [facility] at [facility] on an available bed for Pt.  Counselor confirmed with Dedra Skeens, Forensic scientist for the [facility] and Child psychotherapist, Claudine that Pt. Will be attending [facility] tomorrow.  Counselor called [facility] back and confirmed a bed for Pt.

## 2015-03-29 NOTE — Plan of Care (Signed)
Problem: Ineffective individual coping Goal: LTG: Patient will report a decrease in negative feelings Outcome: Progressing Patient agreeable to go to [facility].

## 2015-03-29 NOTE — Progress Notes (Signed)
D: Patient denies SI/HI/AVH.  Patient affect is sad and her mood is depressed.  Patient did attend evening group. Patient visible on the milieu. No distress noted. A: Support and encouragement offered. Scheduled medications given to pt. Q 15 min checks continued for patient safety. R: Patient receptive. Patient remains safe on the unit.   

## 2015-03-29 NOTE — Progress Notes (Signed)
D: pt aware of discharge this shift, pt denies suicidal ideation or homicidal ideation, pt calm and cooperative, no distress noted  A: all personal items in locker returned to patient, instructions given on discharge information, follow up appointment  At [facility]. Pt left by taxi.  R: patient states she will comply with outpatient services and medications as prescribed

## 2015-03-29 NOTE — Progress Notes (Signed)
  [facility] Adult Case Management Discharge Plan :  Will you be returning to the same living situation after discharge:  No. Patient will be supported by [facility] to find suitable housing At discharge, do you have transportation home?: Yes Do you have the ability to pay for your medications: No.  Release of information consent forms completed and in the chart;  Patient's signature needed at discharge.  Patient to Follow up at: Follow-up Information    Follow up with [facility] Today.   Why:  Hospital Discharge Follow up [facility] upon discharge from [facility]   Contact information:   [address] Sandy Mason [phone]      Patient denies SI/HI: Yes,       Safety Planning and Suicide Prevention discussed: Yes,     Have you used any form of tobacco in the last 30 days? (Cigarettes, Smokeless Tobacco, Cigars, and/or Pipes): Yes  Has patient been referred to the Quitline?: Patient refused referral  Sandy Mason 03/29/2015, 8:52 AM
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
## 2015-03-29 NOTE — Progress Notes (Signed)
LCSW received call from [facility] and transportation has been approved by Damian Leavell.  Patient will transport to [facility] by [place] [facility] cab  LCSW completed suicide education with patient ( Aug 29th) and several resource lists were provided to patient.Unk Pinto was informed that patient will be going to [facility] and will follow up after residential program at [facility] complete.  In meeting with patient she was no longer suicidal or homicidal or experiencing audio or visual hallucinations.
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
## 2015-03-29 NOTE — [facility] Suicide Risk Assessment (Signed)
[facility] INPATIENT:  Family/Significant Other Suicide Prevention Education  Suicide Prevention Education: Patient herself was provided suicide prevention education and resource lists Education Completed  No family member has been identified by the patient as the family member/significant other with whom the patient will be residing, and identified as the person(s) who will aid the patient in the event of a mental health crisis (suicidal ideations/suicide attempt).  With written consent from the patient, the family member/significant other has been provided the following suicide prevention education, prior to the and/or following the discharge of the patient.  The suicide prevention education provided includes the following:  Suicide risk factors  Suicide prevention and interventions  National Suicide Hotline telephone number  [facility] assessment telephone number  [facility] Emergency Assistance 911  [facility] and/or Residential Mobile Crisis Unit telephone number  Request made of family/significant other to:  Remove weapons (e.g., guns, rifles, knives), all items previously/currently identified as safety concern.    Remove drugs/medications (over-the-counter, prescriptions, illicit drugs), all items previously/currently identified as a safety concern.  The family member/significant other verbalizes understanding of the suicide prevention education information provided.  The family member/significant other agrees to remove the items of safety concern listed above.  Sandy Mason 03/29/2015, 8:55 AM

## 2015-03-31 LAB — POCT PREGNANCY, URINE: Preg Test, Ur: NEGATIVE

## 2015-10-31 ENCOUNTER — Emergency Department: Payer: Self-pay

## 2015-10-31 ENCOUNTER — Observation Stay
Admission: EM | Admit: 2015-10-31 | Discharge: 2015-11-01 | Disposition: A | Discharge status: Home / Self Care | Payer: Self-pay | Attending: Internal Medicine | Admitting: Internal Medicine

## 2015-10-31 DIAGNOSIS — F4310 Post-traumatic stress disorder, unspecified: Secondary | ICD-10-CM | POA: Insufficient documentation

## 2015-10-31 DIAGNOSIS — S329XXA Fracture of unspecified parts of lumbosacral spine and pelvis, initial encounter for closed fracture: Secondary | ICD-10-CM | POA: Diagnosis present

## 2015-10-31 DIAGNOSIS — F419 Anxiety disorder, unspecified: Secondary | ICD-10-CM | POA: Insufficient documentation

## 2015-10-31 DIAGNOSIS — F1020 Alcohol dependence, uncomplicated: Secondary | ICD-10-CM | POA: Insufficient documentation

## 2015-10-31 DIAGNOSIS — F603 Borderline personality disorder: Secondary | ICD-10-CM | POA: Insufficient documentation

## 2015-10-31 DIAGNOSIS — Z833 Family history of diabetes mellitus: Secondary | ICD-10-CM | POA: Insufficient documentation

## 2015-10-31 DIAGNOSIS — F17200 Nicotine dependence, unspecified, uncomplicated: Secondary | ICD-10-CM | POA: Insufficient documentation

## 2015-10-31 DIAGNOSIS — Z79899 Other long term (current) drug therapy: Secondary | ICD-10-CM | POA: Insufficient documentation

## 2015-10-31 DIAGNOSIS — Z888 Allergy status to other drugs, medicaments and biological substances status: Secondary | ICD-10-CM | POA: Insufficient documentation

## 2015-10-31 DIAGNOSIS — Z881 Allergy status to other antibiotic agents status: Secondary | ICD-10-CM | POA: Insufficient documentation

## 2015-10-31 DIAGNOSIS — R569 Unspecified convulsions: Secondary | ICD-10-CM | POA: Insufficient documentation

## 2015-10-31 DIAGNOSIS — S32592A Other specified fracture of left pubis, initial encounter for closed fracture: Principal | ICD-10-CM | POA: Insufficient documentation

## 2015-10-31 DIAGNOSIS — Z8673 Personal history of transient ischemic attack (TIA), and cerebral infarction without residual deficits: Secondary | ICD-10-CM | POA: Insufficient documentation

## 2015-10-31 DIAGNOSIS — W1789XA Other fall from one level to another, initial encounter: Secondary | ICD-10-CM | POA: Insufficient documentation

## 2015-10-31 DIAGNOSIS — Z791 Long term (current) use of non-steroidal anti-inflammatories (NSAID): Secondary | ICD-10-CM | POA: Insufficient documentation

## 2015-10-31 DIAGNOSIS — F329 Major depressive disorder, single episode, unspecified: Secondary | ICD-10-CM | POA: Insufficient documentation

## 2015-10-31 DIAGNOSIS — Z720 Tobacco use: Secondary | ICD-10-CM | POA: Diagnosis present

## 2015-10-31 HISTORY — DX: Opioid dependence, uncomplicated: F11.20

## 2015-10-31 HISTORY — DX: Alcohol dependence, uncomplicated: F10.20

## 2015-10-31 HISTORY — DX: Major depressive disorder, recurrent severe without psychotic features: F33.2

## 2015-10-31 HISTORY — DX: Borderline personality disorder: F60.3

## 2015-10-31 HISTORY — DX: Post-traumatic stress disorder, unspecified: F43.10

## 2015-10-31 HISTORY — DX: Sedative, hypnotic or anxiolytic dependence, uncomplicated: F13.20

## 2015-10-31 LAB — CBC WITH DIFFERENTIAL/PLATELET
BASOS ABS: 0.1 10*3/uL (ref 0–0.1)
Basophils Relative: 1 %
Eosinophils Absolute: 0.1 10*3/uL (ref 0–0.7)
Eosinophils Relative: 1 %
HEMATOCRIT: 45.1 % (ref 35.0–47.0)
Hemoglobin: 15.2 g/dL (ref 12.0–16.0)
LYMPHS PCT: 27 %
Lymphs Abs: 2.8 10*3/uL (ref 1.0–3.6)
MCH: 31.9 pg (ref 26.0–34.0)
MCHC: 33.6 g/dL (ref 32.0–36.0)
MCV: 94.9 fL (ref 80.0–100.0)
MONO ABS: 0.6 10*3/uL (ref 0.2–0.9)
Monocytes Relative: 6 %
NEUTROS ABS: 6.8 10*3/uL — AB (ref 1.4–6.5)
Neutrophils Relative %: 65 %
PLATELETS: 267 10*3/uL (ref 150–440)
RBC: 4.75 MIL/uL (ref 3.80–5.20)
RDW: 12.6 % (ref 11.5–14.5)
WBC: 10.3 10*3/uL (ref 3.6–11.0)

## 2015-10-31 LAB — COMPREHENSIVE METABOLIC PANEL
ALK PHOS: 78 U/L (ref 38–126)
ALT: 23 U/L (ref 14–54)
ANION GAP: 7 (ref 5–15)
AST: 30 U/L (ref 15–41)
Albumin: 3.8 g/dL (ref 3.5–5.0)
BILIRUBIN TOTAL: 0.6 mg/dL (ref 0.3–1.2)
BUN: 5 mg/dL — ABNORMAL LOW (ref 6–20)
CHLORIDE: 102 mmol/L (ref 101–111)
CO2: 25 mmol/L (ref 22–32)
CREATININE: 0.54 mg/dL (ref 0.44–1.00)
Calcium: 9 mg/dL (ref 8.9–10.3)
Glucose, Bld: 99 mg/dL (ref 65–99)
POTASSIUM: 3.6 mmol/L (ref 3.5–5.1)
Sodium: 134 mmol/L — ABNORMAL LOW (ref 135–145)
Total Protein: 7.4 g/dL (ref 6.5–8.1)

## 2015-10-31 MED ORDER — MORPHINE SULFATE (PF) 4 MG/ML IV SOLN: 4.0000 mg | Freq: Once | INTRAVENOUS | Status: DC

## 2015-10-31 MED ORDER — GABAPENTIN 300 MG PO CAPS
900.0000 mg | ORAL_CAPSULE | Freq: Three times a day (TID) | ORAL | Status: DC
  Administered 2015-10-31 – 2015-11-01 (×3): 900 mg via ORAL
  Filled 2015-10-31 (×3): qty 3

## 2015-10-31 MED ORDER — HYDROMORPHONE HCL 1 MG/ML IJ SOLN
1.0000 mg | INTRAMUSCULAR | Status: DC | PRN
  Administered 2015-10-31 – 2015-11-01 (×5): 1 mg via INTRAVENOUS
  Filled 2015-10-31 (×5): qty 1

## 2015-10-31 MED ORDER — HYDROMORPHONE HCL 1 MG/ML IJ SOLN
INTRAMUSCULAR | Status: AC
  Administered 2015-10-31: 1 mg via INTRAVENOUS
  Filled 2015-10-31: qty 1

## 2015-10-31 MED ORDER — ONDANSETRON HCL 4 MG/2ML IJ SOLN
4.0000 mg | Freq: Once | INTRAMUSCULAR | Status: AC
  Administered 2015-10-31: 4 mg via INTRAVENOUS

## 2015-10-31 MED ORDER — MIRTAZAPINE 15 MG PO TABS
30.0000 mg | ORAL_TABLET | Freq: Every day | ORAL | Status: DC
  Administered 2015-10-31: 30 mg via ORAL
  Filled 2015-10-31: qty 2

## 2015-10-31 MED ORDER — PRAZOSIN HCL 2 MG PO CAPS
2.0000 mg | ORAL_CAPSULE | Freq: Every day | ORAL | Status: DC
  Administered 2015-10-31: 2 mg via ORAL
  Filled 2015-10-31 (×2): qty 1

## 2015-10-31 MED ORDER — ONDANSETRON HCL 4 MG/2ML IJ SOLN
INTRAMUSCULAR | Status: AC
  Administered 2015-10-31: 4 mg via INTRAVENOUS
  Filled 2015-10-31: qty 2

## 2015-10-31 MED ORDER — ENOXAPARIN SODIUM 40 MG/0.4ML ~~LOC~~ SOLN
40.0000 mg | SUBCUTANEOUS | Status: DC
  Administered 2015-10-31: 40 mg via SUBCUTANEOUS
  Filled 2015-10-31: qty 0.4

## 2015-10-31 MED ORDER — IBUPROFEN 400 MG PO TABS
800.0000 mg | ORAL_TABLET | Freq: Four times a day (QID) | ORAL | Status: DC | PRN
  Administered 2015-10-31: 800 mg via ORAL
  Filled 2015-10-31: qty 2

## 2015-10-31 MED ORDER — BUSPIRONE HCL 10 MG PO TABS
10.0000 mg | ORAL_TABLET | Freq: Three times a day (TID) | ORAL | Status: DC
  Administered 2015-10-31 – 2015-11-01 (×3): 10 mg via ORAL
  Filled 2015-10-31 (×3): qty 1

## 2015-10-31 MED ORDER — HYDROMORPHONE HCL 1 MG/ML IJ SOLN
1.0000 mg | Freq: Once | INTRAMUSCULAR | Status: AC
  Administered 2015-10-31: 1 mg via INTRAVENOUS
  Filled 2015-10-31: qty 1

## 2015-10-31 MED ORDER — VENLAFAXINE HCL ER 75 MG PO CP24
225.0000 mg | ORAL_CAPSULE | Freq: Every day | ORAL | Status: DC
  Administered 2015-11-01: 225 mg via ORAL
  Filled 2015-10-31 (×2): qty 1

## 2015-10-31 MED ORDER — HYDROMORPHONE HCL 1 MG/ML IJ SOLN
1.0000 mg | Freq: Once | INTRAMUSCULAR | Status: AC
  Administered 2015-10-31: 1 mg via INTRAMUSCULAR
  Filled 2015-10-31: qty 1

## 2015-10-31 MED ORDER — HYDROMORPHONE HCL 1 MG/ML IJ SOLN
1.0000 mg | Freq: Once | INTRAMUSCULAR | Status: AC
  Administered 2015-10-31: 1 mg via INTRAVENOUS

## 2015-10-31 NOTE — ED Notes (Signed)
States she fell on Friday  But conts to have pain at left groin/upper leg   Unable to bear wt

## 2015-10-31 NOTE — ED Provider Notes (Signed)
[facility] Emergency Department Provider Note  ____________________________________________  Time seen: Approximately 3:19 PM  I have reviewed the triage vital signs and the nursing notes.   HISTORY  Chief Complaint Leg Pain    HPI Sandy Mason is a 40 y.o. female patientcomplaining of pain to the left groin and medial upper leg. Patient states she fell backwards height over extending her hip. Patient continued care pain which has increased with weightbearing. Patient states she has to shuffle to go to the bathroom. Patient is rating the pain as a 10 over 10. No palliative measures taken for this complaint. Patient described the pain as "sharp".   Past Medical History  Diagnosis Date  . CVA (cerebral infarction)   . Seizures (HCC)   . Depression   . Anxiety   . Suicide attempt by hanging [facility])     Patient Active Problem List   Diagnosis Date Noted  . Tobacco use disorder 03/28/2015  . PTSD (post-traumatic stress disorder) 03/28/2015  . Stimulant use disorder (cocaine) 03/28/2015  . 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 Opioid use disorder, severe, dependence (HCC) 12/20/2014  . 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 Alcohol use disorder severe. 12/18/2014  . Alcohol withdrawal (HCC) 12/18/2014  . Borderline personality disorder 12/18/2014    History reviewed. No pertinent past surgical history.  Current Outpatient Rx  Name  Route  Sig  Dispense  Refill  . ARIPiprazole (ABILIFY) 10 MG tablet   Oral   Take 1 tablet (10 mg total) by mouth daily.   30 tablet   0   . mirtazapine (REMERON) 30 MG tablet   Oral   Take 30 mg by mouth at bedtime.         . prazosin (MINIPRESS) 1 MG capsule   Oral   Take 2 capsules (2 mg total) by mouth at bedtime.   60 capsule   0   . venlafaxine XR (EFFEXOR-XR) 150 MG 24 hr capsule   Oral   Take 1 capsule  (150 mg total) by mouth daily with breakfast.   30 capsule   0     Allergies Depakote; Haldol; Nicoderm; and Keflet  No family history on file.  Social History Social History  Substance Use Topics  . Smoking status: Current Every Day Smoker  . Smokeless tobacco: None  . Alcohol Use: 3.6 oz/week    6 Cans of beer per week     Comment: last used 6 months ago    Review of Systems Constitutional: No fever/chills Eyes: No visual changes. ENT: No sore throat. Cardiovascular: Denies chest pain. Respiratory: Denies shortness of breath. Gastrointestinal: No abdominal pain.  No nausea, no vomiting.  No diarrhea.  No constipation. Genitourinary: Negative for dysuria. Musculoskeletal:Left hip/groin pain  Skin: Negative for rash. Neurological: Negative for headaches, focal weakness or numbness. Psychiatric:Anxiety, depression, suicide attempt. Allergic/Immunilogical: The medication list  10-point ROS otherwise negative.  ____________________________________________   PHYSICAL EXAM:  VITAL SIGNS: ED Triage Vitals  Enc Vitals Group     BP 10/31/15 1451 105/79 mmHg     Pulse Rate 10/31/15 1450 73     Resp 10/31/15 1450 14     Temp 10/31/15 1450 98.2 F (36.8 C)     Temp Source 10/31/15 1450 Oral     SpO2 10/31/15 1450 99 %     Weight 10/31/15 1450 232 lb (105.235 kg)     Height 10/31/15 1450 5'  8" (1.727 m)     Head Cir --      Peak Flow --      Pain Score 10/31/15 1450 10     Pain Loc --      Pain Edu? --      Excl. in GC? --     Constitutional: Alert and oriented. Well appearing and in no acute distress. Eyes: Conjunctivae are normal. PERRL. EOMI. Head: Atraumatic. Nose: No congestion/rhinnorhea. Mouth/Throat: Mucous membranes are moist.  Oropharynx non-erythematous. Neck: No stridor.  No cervical spine tenderness to palpation. Cardiovascular: Normal rate, regular rhythm. Grossly normal heart sounds.  Good peripheral circulation. Respiratory: Normal respiratory  effort.  No retractions. Lungs CTAB. Gastrointestinal: Soft and nontender. No distention. No abdominal bruits. No CVA tenderness. Musculoskeletal: No obvious deformity to the left hip. Patient keeps the knee flexed wrist seems to decrease the pain in the hip joint. Patient ambulates with atypical gait. Patient has some moderate guarding palpation of the left inguinal area. Neurologic:  Normal speech and language. No gross focal neurologic deficits are appreciated. No gait instability. Skin:  Skin is warm, dry and intact. No rash noted. Psychiatric: Mood and affect are normal. Speech and behavior are normal.  ____________________________________________   LABS (all labs ordered are listed, but only abnormal results are displayed)  Labs Reviewed  COMPREHENSIVE METABOLIC PANEL - Abnormal; Notable for the following:    Sodium 134 (*)    BUN 5 (*)    All other components within normal limits  CBC WITH DIFFERENTIAL/PLATELET - Abnormal; Notable for the following:    Neutro Abs 6.8 (*)    All other components within normal limits   ____________________________________________  EKG   ____________________________________________  RADIOLOGY  Acute fracture left superior and inferior pubic rami.  _CT scan of the pelvic confirm x-ray findings with no other acute findings. ___________________________________________   PROCEDURES  Procedure(s) performed: None  Critical Care performed: No  ____________________________________________   INITIAL IMPRESSION / ASSESSMENT AND PLAN / ED COURSE  Pertinent labs & imaging results that were available during my care of the patient were reviewed by me and considered in my medical decision making (see chart for details).  Discussed x-ray /CT Scan finding with patient.___Patient unable to bear weight secondary to complain of pain. Patient will be admitted under medicine for consult orthopedics. Discussed patient with Dr. Assunta CurtisKrasinski.i.  _________________________________________   FINAL CLINICAL IMPRESSION(S) / ED DIAGNOSES  Final diagnoses:  Fracture of multiple pubic rami, left, closed, initial encounter [facility](HCC)      Joni Reiningonald K Maizie Garno, PA-C 10/31/15 1909  Emily FilbertJonathan E Williams, MD 11/01/15 [id number]

## 2015-10-31 NOTE — ED Notes (Signed)
Pt arrives to ER via POV c/o left upper leg pain since Friday after fall. Pt denies other injuries. Pt alert and oriented X4, active, cooperative, pt in NAD. RR even and unlabored, color WNL.

## 2015-10-31 NOTE — H&P (Addendum)
[facility] [place] at [facility]   PATIENT NAME: Sandy Mason    MR#:  [id number]  DATE OF BIRTH:  1975-09-10  DATE OF ADMISSION:  10/31/2015  PRIMARY CARE PHYSICIAN: No primary care provider on file.   REQUESTING/REFERRING PHYSICIAN: Dr Alphonzo Lemmings  CHIEF COMPLAINT:   Chief Complaint  Patient presents with  . Leg Pain    HISTORY OF PRESENT ILLNESS: Sandy Mason  is a 40 y.o. female with a known history of CVA, seizures, depression, SI, tobacco abuse, PTSD, previous history of opioid amount of benzodiazepine and alcohol abuse with subsequent detoxification. He presents to the ED today due to pain in her left hip and difficulty walking. Patient states that she was sitting on a banister house to stories high about 4 days ago while moving into a new apartment. She was talking on the phone and lost balance and fell to stories down. There was no immediate injury and patient was able to function for that day. The next day, she developed pain in her left heel and pain was described as sharp, rated at 10 over 10 and resulted in her limping around. She tried to tough it out for the next 2 days or presented to the ED for evaluation today due to unrelenting pain. She denies any bleeding, nausea, vomiting, changes in appetite or confusion. She denies any dizziness or lightheadedness prior to fall. Appetite is normal. Patient has not been started on a new medication. Of note, patient has a prior history of alcohol abuse with alcohol withdrawal, narcotic abuse and benzodiazepine abuse. She has undergone detoxification about 6 months ago and continues to follow up with them. She does not take any substance at this time. Her last use of alcohol was about 6 months ago.  On arrival in the ED, vitals are stable and CMP was only significant for sodium of 134. CBC was normal. CT scan shows acute fracture of the left pubic ramus with minimal displacement. Patient was given Dilaudid and  Zofran in the ED and will be admitted for observation due to pelvic fracture. She is full code.  PAST MEDICAL HISTORY:   Past Medical History  Diagnosis Date  . CVA (cerebral infarction)   . Seizures (HCC)   . Depression   . Anxiety   . Suicide attempt by hanging (HCC)   . Alcohol use disorder severe. 12/18/2014  . 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 Borderline personality disorder 12/18/2014  . Opioid use disorder, severe, dependence (HCC) 12/20/2014  . 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 PTSD (post-traumatic stress disorder) 03/28/2015    PAST SURGICAL HISTORY: History reviewed. No pertinent past surgical history.  SOCIAL HISTORY:  Social History  Substance Use Topics  . Smoking status: Current Every Day Smoker  . Smokeless tobacco: Not on file  . Alcohol Use: 3.6 oz/week    6 Cans of beer per week     Comment: last used 6 months ago    FAMILY HISTORY:  Family History  Problem Relation Age of Onset  . Diabetes Father     DRUG ALLERGIES:  Allergies  Allergen Reactions  . Depakote [Valproic Acid] Other (See Comments)    Reaction:  Seizures   . Haldol [Haloperidol Lactate] Other (See Comments)    Reaction:  Seizures   . Keflet [Cephalexin] Rash  . Nicoderm [Nicotine] Rash    REVIEW OF SYSTEMS:   CONSTITUTIONAL: No fever, fatigue or weakness.  EYES: No blurred or double vision.  EARS, NOSE, AND THROAT: No tinnitus or ear pain.  RESPIRATORY: No cough, shortness of breath, wheezing or hemoptysis.  CARDIOVASCULAR: No chest pain, orthopnea, edema.  GASTROINTESTINAL: No nausea, vomiting, diarrhea or abdominal pain.  GENITOURINARY: No dysuria, hematuria.  ENDOCRINE: No polyuria, nocturia,  HEMATOLOGY: No anemia, easy bruising or bleeding SKIN: No rash or lesion. MUSCULOSKELETAL: As in HPI NEUROLOGIC: No tingling, numbness, weakness.  PSYCHIATRY: No anxiety or depression.    MEDICATIONS AT HOME:  Prior to Admission medications   Medication Sig Start Date End Date Taking? Authorizing Provider  busPIRone (BUSPAR) 10 MG tablet Take 10 mg by mouth 3 (three) times daily.   Yes Historical Provider, MD  gabapentin (NEURONTIN) 300 MG capsule Take 900 mg by mouth 3 (three) times daily.   Yes Historical Provider, MD  ibuprofen (ADVIL,MOTRIN) 200 MG tablet Take 800 mg by mouth every 6 (six) hours as needed for mild pain.   Yes Historical Provider, MD  mirtazapine (REMERON) 30 MG tablet Take 30 mg by mouth at bedtime.   Yes Historical Provider, MD  prazosin (MINIPRESS) 1 MG capsule Take 2 capsules (2 mg total) by mouth at bedtime. 12/23/14  Yes Shari ProwsJolanta B Pucilowska, MD  venlafaxine XR (EFFEXOR-XR) 75 MG 24 hr capsule Take 225 mg by mouth daily with breakfast.   Yes Historical Provider, MD      PHYSICAL EXAMINATION:   VITAL SIGNS: Blood pressure 140/106, pulse 79, temperature 98.2 F (36.8 C), temperature source Oral, resp. rate 18, height 5\' 8"  (1.727 m), weight 105.235 kg (232 lb), SpO2 96 %.  GENERAL: Obese 40 y.o.-year-old patient lying in the bed with no acute distress. Alert and oriented x 3. EYES: Pupils equal, round, reactive to light and accommodation. No scleral icterus. Extraocular muscles intact.  HEENT: Head atraumatic, normocephalic. Oropharynx and nasopharynx clear.  NECK:  Supple, no jugular venous distention. No thyroid enlargement, no tenderness.  LUNGS: Normal breath sounds bilaterally, no wheezing, rales,rhonchi or crepitation. No use of accessory muscles of respiration.  CARDIOVASCULAR: S1, S2 normal. No murmurs, rubs, or gallops.  ABDOMEN: Soft, nontender, nondistended. Bowel sounds present. No organomegaly or mass.  EXTREMITIES: There is decreased range of motion due to pain on the left hip joints. No swelling. NEUROLOGIC: Cranial nerves II through XII are intact. Muscle strength 5/5 in all extremities. Sensation intact. Gait not checked.   SKIN:  No obvious rash, lesion, or ulcer.   LABORATORY PANEL:   CBC  Recent Labs Lab 10/31/15 1712  WBC 10.3  HGB 15.2  HCT 45.1  PLT 267  MCV 94.9  MCH 31.9  MCHC 33.6  RDW 12.6  LYMPHSABS 2.8  MONOABS 0.6  EOSABS 0.1  BASOSABS 0.1   ------------------------------------------------------------------------------------------------------------------  Chemistries   Recent Labs Lab 10/31/15 1712  NA 134*  K 3.6  CL 102  CO2 25  GLUCOSE 99  BUN 5*  CREATININE 0.54  CALCIUM 9.0  AST 30  ALT 23  ALKPHOS 78  BILITOT 0.6   ------------------------------------------------------------------------------------------------------------------ estimated creatinine clearance is 121 mL/min (by C-G formula based on Cr of 0.54). ------------------------------------------------------------------------------------------------------------------ No results for input(s): TSH, T4TOTAL, T3FREE, THYROIDAB in the last 72 hours.  Invalid input(s): FREET3   Coagulation profile No results for input(s): INR, PROTIME in the last 168 hours. ------------------------------------------------------------------------------------------------------------------- No results for input(s): DDIMER in the last 72 hours. -------------------------------------------------------------------------------------------------------------------  Cardiac Enzymes No results for input(s): CKMB, TROPONINI, MYOGLOBIN in the last 168 hours.  Invalid input(s): CK ------------------------------------------------------------------------------------------------------------------ Invalid input(s): POCBNP  ---------------------------------------------------------------------------------------------------------------  Urinalysis    Component Value Date/Time   COLORURINE YELLOW* 12/17/2014 1557   COLORURINE Straw 10/30/2014 0155   APPEARANCEUR HAZY* 12/17/2014 1557   APPEARANCEUR Clear 10/30/2014 0155   LABSPEC 1.035*  12/17/2014 1557   LABSPEC 1.009 10/30/2014 0155   PHURINE 5.0 12/17/2014 1557   PHURINE 6.0 10/30/2014 0155   GLUCOSEU NEGATIVE 12/17/2014 1557   GLUCOSEU Negative 10/30/2014 0155   HGBUR 3+* 12/17/2014 1557   HGBUR Negative 10/30/2014 0155   BILIRUBINUR NEGATIVE 12/17/2014 1557   BILIRUBINUR Negative 10/30/2014 0155   KETONESUR NEGATIVE 12/17/2014 1557   KETONESUR Negative 10/30/2014 0155   PROTEINUR 30* 12/17/2014 1557   PROTEINUR Negative 10/30/2014 0155   NITRITE NEGATIVE 12/17/2014 1557   NITRITE Negative 10/30/2014 0155   LEUKOCYTESUR NEGATIVE 12/17/2014 1557   LEUKOCYTESUR Negative 10/30/2014 0155     RADIOLOGY: Ct Pelvis Wo Contrast  10/31/2015  CLINICAL DATA:  LEFT upper leg pain since Friday after falling, unable to ambulate EXAM: CT PELVIS WITHOUT CONTRAST TECHNIQUE: Multidetector CT imaging of the pelvis was performed following the standard protocol without intravenous contrast. Sagittal and coronal MPR images reconstructed from axial data set. COMPARISON:  CT 02/03/2013, radiographs 10/31/2015 FINDINGS: Nondisplaced LEFT superior and inferior pubic rami fractures as noted on radiographs. No additional fracture or dislocation. SI joints and hip joints symmetric. Osseous mineralization normal. IUD in uterus. Normal appendix. Bladder, ureters, uterus and adnexa otherwise unremarkable. Bowel loops normal appearance. Minimal stranding of fat planes in LEFT pelvis associated with the above-noted fractures. Soft tissues otherwise normal appearance. No free intraperitoneal fluid. IMPRESSION: Nondisplaced LEFT superior and inferior pubic rami fractures. Electronically Signed   By: Ulyses Southward M.D.   On: 10/31/2015 18:03   Dg Hip Unilat With Pelvis 2-3 Views Left  10/31/2015  CLINICAL DATA:  Fall at home 3 days ago. Left hip pain. Initial encounter. EXAM: DG HIP (WITH OR WITHOUT PELVIS) 2-3V LEFT COMPARISON:  None. FINDINGS: Acute fractures are identified of both pubic rami on the left.  Lateral superior pubic ramus fracture is nondisplaced and lies just medial to the acetabulum. A medial inferior pubic ramus fracture shows minimal displacement. The pubic symphysis is normally aligned and shows no evidence of diastasis. Sacroiliac joints and the rest of the bony pelvis also shows a normal appearance. No evidence of hip fracture or dislocation. IMPRESSION: Acute fractures of both left-sided superior and inferior pubic rami. The superior pubic ramus fracture is nondisplaced and the inferior pubic ramus fracture shows minimal displacement. Electronically Signed   By: Irish Lack M.D.   On: 10/31/2015 16:48    EKG: Orders placed or performed during the hospital encounter of 12/17/14  . ED EKG  . ED EKG  . EKG 12-Lead  . EKG 12-Lead  . EKG    ASSESSMENT  Principal Problem:   Closed pelvic fracture (HCC) Active Problems:   Fall   Tobacco abuse  PLAN   1). Close left pelvic fracture secondary to fall - Patient had a mechanical fall about 4 days ago that resulted in left pelvic fracture confirmed on CT scan. - Continue pain control and initial fall precautions. Continue home ibuprofen. - Consult physical therapy - Consult case management for probable need for home physical therapy.  2). Tobacco abuse - I counseled patient on the need to quit smoking especially we light of her previous success with quitting alcohol and prescription drugs. Counseling lasted for about 5 minutes. - No nicotine replacement S patient is allergic to nicotine patches.  All the  records are reviewed and case discussed with ED provider. Management plans discussed with the patient, family and they are in agreement.  CODE STATUS: Code Status History    Date Active Date Inactive Code Status Order ID Comments User Context   03/27/2015  1:59 AM 03/29/2015  1:20 PM Full Code [id number]  Beau Fanny, MD Inpatient   12/19/2014  6:28 PM 12/23/2014  4:30 PM Full Code [id number]  Audery Amel, MD  Inpatient   12/17/2014  9:16 PM 12/19/2014  6:28 PM Full Code [id number]  Katha Hamming, MD Inpatient      I have independently reviewed all EKG and chest x-ray data  VTE prophylaxis: Lovenox if no contraindications and patient at low risk for bleeding. SCD's and progressive ambulation if patient has contraindications to anticoagulants. No DVT prophylaxis if patient presently receiving therapeutic anticoagulation or is at significant risk of bleeding for which the risk of anticoagulation outweigh the potential benefits.  Vaccinations: Pneumonia & flu vaccine per hospital protocol Prevention: Will proceed with conservative measures for the prevention of delirium in patients older than 65. Fall precautions and 1:1 sitter as needed per hospital protocol.  TOTAL TIME TAKING CARE OF THIS PATIENT: 40 minutes.    Robley Fries M.D on 10/31/2015 at 8:57 PM  Between 7am to 6pm - Pager - [phone]  After 6pm go to [email or web address] - password [email or web address] [facility]  [place] [facility]  Office  [phone]  CC: Primary care physician; No primary care provider on file.

## 2015-11-01 MED ORDER — OXYCODONE-ACETAMINOPHEN 5-325 MG PO TABS: 1.0000 | ORAL_TABLET | Freq: Four times a day (QID) | ORAL | Status: DC | PRN

## 2015-11-01 NOTE — Care Management (Addendum)
Patient discharging. I'm trying to find crutches for patient. Donated crutches obtained and delivered to patient. PT will need to adjust for patient. RN updated and present in room. No further RNCM needs. Case closed.

## 2015-11-01 NOTE — Discharge Summary (Signed)
[facility] - [place] at [facility]   PATIENT NAME: Sandy Mason    MR#:  [id number]  DATE OF BIRTH:  [birth year]  DATE OF ADMISSION:  10/31/2015 ADMITTING PHYSICIAN: Myna Hidalgo, DO  DATE OF DISCHARGE:11/01/2015  PRIMARY CARE PHYSICIAN: No primary care provider on file.    ADMISSION DIAGNOSIS:  Fracture of multiple pubic rami, left, closed, initial encounter (HCC) [S32.592A]  DISCHARGE DIAGNOSIS:  Principal Problem:   Closed pelvic fracture (HCC) Active Problems:   Fall   Tobacco abuse   SECONDARY DIAGNOSIS:   Past Medical History  Diagnosis Date  . CVA (cerebral infarction)   . Seizures (HCC)   . Depression   . Anxiety   . Suicide attempt by hanging (HCC)   . Alcohol use disorder severe. 12/18/2014  . 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 Borderline personality disorder 12/18/2014  . Opioid use disorder, severe, dependence (HCC) 12/20/2014  . 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 PTSD (post-traumatic stress disorder) 03/28/2015    HOSPITAL COURSE:   1). Close left pelvic fracture secondary to fall - Patient had a mechanical fall about 4 days ago that resulted in left pelvic fracture confirmed on CT scan. - Continue pain control and initial fall precautions. Continue home ibuprofen. - Consulted physical therapy - Appreciated recommendation by ortho- Cru[place] and bed side commode is arranged.  2). Tobacco abuse - I counseled patient on the need to quit smoking especially we light of her previous success with quitting alcohol and prescription drugs. Counseling lasted for about 5 minutes. - No nicotine replacement S patient is allergic to nicotine patches.  DISCHARGE CONDITIONS:   Stable.  CONSULTS OBTAINED:     DRUG ALLERGIES:   Allergies  Allergen Reactions  . Depakote [Valproic Acid] Other (See Comments)    Reaction:  Seizures   . Haldol  [Haloperidol Lactate] Other (See Comments)    Reaction:  Seizures   . Keflet [Cephalexin] Rash  . Nicoderm [Nicotine] Rash    DISCHARGE MEDICATIONS:   Current Discharge Medication List    START taking these medications   Details  oxyCODONE-acetaminophen (ROXICET) 5-325 MG tablet Take 1 tablet by mouth every 6 (six) hours as needed for moderate pain or severe pain. Qty: 40 tablet, Refills: 0      CONTINUE these medications which have NOT CHANGED   Details  busPIRone (BUSPAR) 10 MG tablet Take 10 mg by mouth 3 (three) times daily.    gabapentin (NEURONTIN) 300 MG capsule Take 900 mg by mouth 3 (three) times daily.    ibuprofen (ADVIL,MOTRIN) 200 MG tablet Take 800 mg by mouth every 6 (six) hours as needed for mild pain.    mirtazapine (REMERON) 30 MG tablet Take 30 mg by mouth at bedtime.    prazosin (MINIPRESS) 1 MG capsule Take 2 capsules (2 mg total) by mouth at bedtime. Qty: 60 capsule, Refills: 0    venlafaxine XR (EFFEXOR-XR) 75 MG 24 hr capsule Take 225 mg by mouth daily with breakfast.         DISCHARGE INSTRUCTIONS:    Follow with ortho clinic in 2 weeks.  If you experience worsening of your admission symptoms, develop shortness of breath, life threatening emergency, suicidal or homicidal thoughts you must seek medical attention immediately by calling 911 or calling your MD immediately  if symptoms less severe.  You Must read complete instructions/literature along with all the possible adverse reactions/side effects for all the  Medicines you take and that have been prescribed to you. Take any new Medicines after you have completely understood and accept all the possible adverse reactions/side effects.   Please note  You were cared for by a hospitalist during your hospital stay. If you have any questions about your discharge medications or the care you received while you were in the hospital after you are discharged, you can call the unit and asked to speak with  the hospitalist on call if the hospitalist that took care of you is not available. Once you are discharged, your primary care physician will handle any further medical issues. Please note that NO REFILLS for any discharge medications will be authorized once you are discharged, as it is imperative that you return to your primary care physician (or establish a relationship with a primary care physician if you do not have one) for your aftercare needs so that they can reassess your need for medications and monitor your lab values.    Today   CHIEF COMPLAINT:   Chief Complaint  Patient presents with  . Leg Pain    HISTORY OF PRESENT ILLNESS:  Sandy Mason  is a 40 y.o. female with a known history of CVA, seizures, depression, SI, tobacco abuse, PTSD, previous history of opioid amount of benzodiazepine and alcohol abuse with subsequent detoxification. He presents to the ED today due to pain in her left hip and difficulty walking. Patient states that she was sitting on a banister house to stories high about 4 days ago while moving into a new apartment. She was talking on the phone and lost balance and fell to stories down. There was no immediate injury and patient was able to function for that day. The next day, she developed pain in her left heel and pain was described as sharp, rated at 10 over 10 and resulted in her limping around. She tried to tough it out for the next 2 days or presented to the ED for evaluation today due to unrelenting pain. She denies any bleeding, nausea, vomiting, changes in appetite or confusion. She denies any dizziness or lightheadedness prior to fall. Appetite is normal. Patient has not been started on a new medication. Of note, patient has a prior history of alcohol abuse with alcohol withdrawal, narcotic abuse and benzodiazepine abuse. She has undergone detoxification about 6 months ago and continues to follow up with them. She does not take any substance at this time. Her  last use of alcohol was about 6 months ago.  On arrival in the ED, vitals are stable and CMP was only significant for sodium of 134. CBC was normal. CT scan shows acute fracture of the left pubic ramus with minimal displacement. Patient was given Dilaudid and Zofran in the ED and will be admitted for observation due to pelvic fracture. She is full code.   VITAL SIGNS:  Blood pressure 139/80, pulse 73, temperature 97.5 F (36.4 C), temperature source Oral, resp. rate 17, height  (1.727 m), weight 105.144 kg (231 lb 12.8 oz), SpO2 96 %.  I/O:   Intake/Output Summary (Last 24 hours) at 11/01/15 1404 Last data filed at 11/01/15 1155  Gross per 24 hour  Intake    480 ml  Output    200 ml  Net    280 ml    PHYSICAL EXAMINATION:  GENERAL:  40 y.o.-year-old patient lying in the bed with no acute distress.  EYES: Pupils equal, round, reactive to light and accommodation. No scleral icterus.  Extraocular muscles intact.  HEENT: Head atraumatic, normocephalic. Oropharynx and nasopharynx clear.  NECK:  Supple, no jugular venous distention. No thyroid enlargement, no tenderness.  LUNGS: Normal breath sounds bilaterally, no wheezing, rales,rhonchi or crepitation. No use of accessory muscles of respiration.  CARDIOVASCULAR: S1, S2 normal. No murmurs, rubs, or gallops.  ABDOMEN: Soft, non-tender, non-distended. Bowel sounds present. No organomegaly or mass.  EXTREMITIES: No pedal edema, cyanosis, or clubbing. Left hip pain. NEUROLOGIC: Cranial nerves II through XII are intact. Muscle strength 5/5 in all extremities. Sensation intact. Gait not checked.  PSYCHIATRIC: The patient is alert and oriented x 3.  SKIN: No obvious rash, lesion, or ulcer.   DATA REVIEW:   CBC  Recent Labs Lab 10/31/15 1712  WBC 10.3  HGB 15.2  HCT 45.1  PLT 267    Chemistries   Recent Labs Lab 10/31/15 1712  NA 134*  K 3.6  CL 102  CO2 25  GLUCOSE 99  BUN 5*  CREATININE 0.54  CALCIUM 9.0  AST 30   ALT 23  ALKPHOS 78  BILITOT 0.6    Cardiac Enzymes No results for input(s): TROPONINI in the last 168 hours.  Microbiology Results  No results found for this or any previous visit.  RADIOLOGY:  Ct Pelvis Wo Contrast  10/31/2015  CLINICAL DATA:  LEFT upper leg pain since Friday after falling, unable to ambulate EXAM: CT PELVIS WITHOUT CONTRAST TECHNIQUE: Multidetector CT imaging of the pelvis was performed following the standard protocol without intravenous contrast. Sagittal and coronal MPR images reconstructed from axial data set. COMPARISON:  CT 02/03/2013, radiographs 10/31/2015 FINDINGS: Nondisplaced LEFT superior and inferior pubic rami fractures as noted on radiographs. No additional fracture or dislocation. SI joints and hip joints symmetric. Osseous mineralization normal. IUD in uterus. Normal appendix. Bladder, ureters, uterus and adnexa otherwise unremarkable. Bowel loops normal appearance. Minimal stranding of fat planes in LEFT pelvis associated with the above-noted fractures. Soft tissues otherwise normal appearance. No free intraperitoneal fluid. IMPRESSION: Nondisplaced LEFT superior and inferior pubic rami fractures. Electronically Signed   By: Ulyses SouthwardMark  Boles M.D.   On: 10/31/2015 18:03   Dg Hip Unilat With Pelvis 2-3 Views Left  10/31/2015  CLINICAL DATA:  Fall at home 3 days ago. Left hip pain. Initial encounter. EXAM: DG HIP (WITH OR WITHOUT PELVIS) 2-3V LEFT COMPARISON:  None. FINDINGS: Acute fractures are identified of both pubic rami on the left. Lateral superior pubic ramus fracture is nondisplaced and lies just medial to the acetabulum. A medial inferior pubic ramus fracture shows minimal displacement. The pubic symphysis is normally aligned and shows no evidence of diastasis. Sacroiliac joints and the rest of the bony pelvis also shows a normal appearance. No evidence of hip fracture or dislocation. IMPRESSION: Acute fractures of both left-sided superior and inferior pubic rami.  The superior pubic ramus fracture is nondisplaced and the inferior pubic ramus fracture shows minimal displacement. Electronically Signed   By: Irish LackGlenn  Yamagata M.D.   On: 10/31/2015 16:48     Management plans discussed with the patient, family and they are in agreement.  CODE STATUS: full.    Code Status Orders        Start     Ordered   10/31/15 2150  Full code   Continuous     10/31/15 2149    Code Status History    Date Active Date Inactive Code Status Order ID Comments User Context   03/27/2015  1:59 AM 03/29/2015  1:20 PM Full Code [id number]  Beau Fanny, MD Inpatient   12/19/2014  6:28 PM 12/23/2014  4:30 PM Full Code [id number]  Audery Amel, MD Inpatient   12/17/2014  9:16 PM 12/19/2014  6:28 PM Full Code [id number]  Katha Hamming, MD Inpatient      TOTAL TIME TAKING CARE OF THIS PATIENT: 35 minutes.    Altamese Dilling M.D on 11/01/2015 at 2:04 PM  Between 7am to 6pm - Pager - [phone]  After 6pm go to [email or web address] - Social research officer, government  [facility]  Office  [phone]  CC: Primary care physician; No primary care provider on file.   Note: This dictation was prepared with Dragon dictation along with smaller phrase technology. Any transcriptional errors that result from this process are unintentional.

## 2015-11-01 NOTE — Consult Note (Signed)
ORTHOPAEDIC CONSULTATION  REQUESTING PHYSICIAN: Altamese Dilling, MD  Chief Complaint: Left superior and inferior pubic rami pelvic fractures status post fall  HPI: Sandy Mason is a 40 y.o. female who complains of left hip pain status post fall. Patient states that she fell from a second-story balcony. She was sitting on the railing on her cell phone and lost her balance. This occurred last Thursday. Patient was able to get up and ambulate following this injury but had increasing pain over the ensuing days. Her pain became so intense yesterday that she presented to the ER. She was having difficulty bearing weight. She denies head injury loss of consciousness during the fall. She denies any other injuries or areas of pain besides the left hip and pelvis. He states that her right hip has bothered her when she has been standing or ambulating.  From the hosptialist's admission H&P, it is documented that the patient has a history of CVA, seizures, depression, SI, tobacco abuse, PTSD, previous history of opioid amount of benzodiazepine and alcohol abuse with subsequent detoxification.   Past Medical History  Diagnosis Date  . CVA (cerebral infarction)   . Seizures (HCC)   . Depression   . Anxiety   . Suicide attempt by hanging (HCC)   . Alcohol use disorder severe. 12/18/2014  . 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 Borderline personality disorder 12/18/2014  . Opioid use disorder, severe, dependence (HCC) 12/20/2014  . 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 PTSD (post-traumatic stress disorder) 03/28/2015   History reviewed. No pertinent past surgical history. Social History   Social History  . Marital Status: Single    Spouse Name: N/A  . Number of Children: N/A  . Years of Education: N/A   Social History Main Topics  . Smoking status: Current Every Day Smoker  . Smokeless tobacco: None   . Alcohol Use: 3.6 oz/week    6 Cans of beer per week     Comment: last used 6 months ago  . Drug Use: Yes    Special: Benzodiazepines, Hydrocodone  . Sexual Activity: Yes    Birth Control/ Protection: IUD   Other Topics Concern  . None   Social History Narrative   Family History  Problem Relation Age of Onset  . Diabetes Father    Allergies  Allergen Reactions  . Depakote [Valproic Acid] Other (See Comments)    Reaction:  Seizures   . Haldol [Haloperidol Lactate] Other (See Comments)    Reaction:  Seizures   . Keflet [Cephalexin] Rash  . Nicoderm [Nicotine] Rash   Prior to Admission medications   Medication Sig Start Date End Date Taking? Authorizing Provider  busPIRone (BUSPAR) 10 MG tablet Take 10 mg by mouth 3 (three) times daily.   Yes Historical Provider, MD  gabapentin (NEURONTIN) 300 MG capsule Take 900 mg by mouth 3 (three) times daily.   Yes Historical Provider, MD  ibuprofen (ADVIL,MOTRIN) 200 MG tablet Take 800 mg by mouth every 6 (six) hours as needed for mild pain.   Yes Historical Provider, MD  mirtazapine (REMERON) 30 MG tablet Take 30 mg by mouth at bedtime.   Yes Historical Provider, MD  prazosin (MINIPRESS) 1 MG capsule Take 2 capsules (2 mg total) by mouth at bedtime. 12/23/14  Yes Jolanta B Pucilowska, MD  venlafaxine XR (EFFEXOR-XR) 75 MG 24 hr capsule Take 225 mg by mouth daily with breakfast.   Yes Historical Provider, MD  Ct Pelvis Wo Contrast  10/31/2015  CLINICAL DATA:  LEFT upper leg pain since Friday after falling, unable to ambulate EXAM: CT PELVIS WITHOUT CONTRAST TECHNIQUE: Multidetector CT imaging of the pelvis was performed following the standard protocol without intravenous contrast. Sagittal and coronal MPR images reconstructed from axial data set. COMPARISON:  CT 02/03/2013, radiographs 10/31/2015 FINDINGS: Nondisplaced LEFT superior and inferior pubic rami fractures as noted on radiographs. No additional fracture or dislocation. SI joints and  hip joints symmetric. Osseous mineralization normal. IUD in uterus. Normal appendix. Bladder, ureters, uterus and adnexa otherwise unremarkable. Bowel loops normal appearance. Minimal stranding of fat planes in LEFT pelvis associated with the above-noted fractures. Soft tissues otherwise normal appearance. No free intraperitoneal fluid. IMPRESSION: Nondisplaced LEFT superior and inferior pubic rami fractures. Electronically Signed   By: Ulyses SouthwardMark  Boles M.D.   On: 10/31/2015 18:03   Dg Hip Unilat With Pelvis 2-3 Views Left  10/31/2015  CLINICAL DATA:  Fall at home 3 days ago. Left hip pain. Initial encounter. EXAM: DG HIP (WITH OR WITHOUT PELVIS) 2-3V LEFT COMPARISON:  None. FINDINGS: Acute fractures are identified of both pubic rami on the left. Lateral superior pubic ramus fracture is nondisplaced and lies just medial to the acetabulum. A medial inferior pubic ramus fracture shows minimal displacement. The pubic symphysis is normally aligned and shows no evidence of diastasis. Sacroiliac joints and the rest of the bony pelvis also shows a normal appearance. No evidence of hip fracture or dislocation. IMPRESSION: Acute fractures of both left-sided superior and inferior pubic rami. The superior pubic ramus fracture is nondisplaced and the inferior pubic ramus fracture shows minimal displacement. Electronically Signed   By: Irish LackGlenn  Yamagata M.D.   On: 10/31/2015 16:48    Positive ROS: All other systems have been reviewed and were otherwise negative with the exception of those mentioned in the HPI and as above.  Physical Exam: General: Alert, no acute distress  MUSCULOSKELETAL: Patient was examined with Dava, her nurse, present during my exam. The patient had ecchymosis over the inguinal crease on the left side. Her skin was intact. There is mild swelling over the anterior hip and pelvis. She had point tenderness over the pubis extending onto the superior ramus. She had pain with fixed then full extension of her  hip. She can flex the hip as well as the knee with mild discomfort. Patient was neurovascularly intact in both lower extremities. She had intact motor function in both lower extremities as well. She had palpable pedal pulses bilaterally.  Assessment: Nondisplaced fractures of the superior and inferior ramus, left pelvis  Plan: Patient states that her pain has improved. I observed her up with physical therapy inability in her room with partial weightbearing on the left lower extremity. Patient did well with physical therapy. She preferred crutches to a walker. I extended the patient that her fractures will not require surgical intervention. She will however need to remain partial weightbearing for approximately 4-6 weeks. I reviewed the patient's plain films as well as ordered a CT scan. CT scan again confirmed no significant displacement of the fracture. Patient will be discharged home. She has an elevated toilet at home and will require a bedside commode. I have ordered crutches for her as well. [facility] as the patient is uninsured. She will follow up with me in approximately 2 weeks following discharge.    Juanell FairlyKRASINSKI, Ashritha Desrosiers, MD    11/01/2015 12:04 PM

## 2015-11-01 NOTE — Care Management Note (Addendum)
Case Management Note  Patient Details  Name: Sandy Mason MRN: [id number] Date of Birth: September 02, 1975  Subjective/Objective:                  Met with patient to discuss discharge planning. She is self-pay, household income less that $2500/month, lives with roommate, has transportation- PCP with charity at [facility]. She will need crutches through [place] care and these will be available to patient tomorrow bedside commode delivered also from [facility] today. Referral to [facility] for PT as outpatient. Per Dr. Mack Guise 50%/partial weight bearing to left side. Dr. Mack Guise will write order and I will fax referral to [facility]. Her contact number [phone]. Patient may need assistance through [facility] for pain management/trauma.    Action/Plan: Referral faxed to [facility]. Application to Med mgt for Rx assistance and [facility] for blood pressure monitoring. RNCM will continue to follow.   Expected Discharge Date:                  Expected Discharge Plan:     In-House Referral:     Discharge planning Services  CM Consult, [facility], Medication Assistance, [facility]  Post Acute Care Choice:  Durable Medical Equipment Choice offered to:  Patient  DME Arranged:  Crutches, Bedside commode DME Agency:  [place]:    [facility] Agency:     Status of Service:  In process, will continue to follow  Medicare Important Message Given:    Date Medicare IM Given:    Medicare IM give by:    Date Additional Medicare IM Given:    Additional Medicare Important Message give by:     If discussed at [place] of Stay Meetings, dates discussed:    Additional Comments:  Marshell Garfinkel, RN 11/01/2015, 11:13 AM

## 2015-11-01 NOTE — Progress Notes (Signed)
Physical Therapy Treatment Patient Details Name: Sandy Mason MRN: [id number] DOB: 08-Feb-1976 Today's Date: 11/01/2015    History of Present Illness 40 yo F presented to ED on 4/3 due to L leg pain after a fall on 3/31. She was found to have a closed pelvic fx. PMH includes CVA, depression, suicide attempt, PTSD, and substance abuse.    PT Comments    Pt gradually progressing towards goals. She is improving with use of crutches during transfers and ambulation requiring supervision to provide cues for proper sequence of gait. Pt demonstrated good safety awareness with no concerns of pt being up to toilet independently. Pt adjusted pt's personal crutches, with pt feeling comfortable with them with trial of ambulation afterwards. She will benefit from continued skilled PT to increase functional I and safety in order to return to PLOF.  Follow Up Recommendations  Outpatient PT     Equipment Recommendations  Crutches    Recommendations for Other Services OT consult     Precautions / Restrictions Precautions Precautions: Fall Restrictions Weight Bearing Restrictions: Yes LLE Weight Bearing: Partial weight bearing    Mobility  Bed Mobility Overal bed mobility: Modified Independent                Transfers Overall transfer level: Needs assistance Equipment used: Crutches Transfers: Sit to/from Stand;Stand Pivot Transfers Sit to Stand: Supervision Stand pivot transfers: Supervision       General transfer comment: cues for hand placement, technique and safety  Ambulation/Gait Ambulation/Gait assistance: Supervision Ambulation Distance (Feet): 30 Feet Assistive device: Crutches Gait Pattern/deviations: Step-to pattern;Decreased weight shift to left Gait velocity: reduced Gait velocity interpretation: Below normal speed for age/gender General Gait Details: Cues provided for using UEs to push down onto crutches and sequence of gait to maintain PWB status   Stairs             Wheelchair Mobility    Modified Rankin (Stroke Patients Only)       Balance Overall balance assessment: Needs assistance Sitting-balance support: No upper extremity supported Sitting balance-Leahy Scale: Good     Standing balance support: No upper extremity supported Standing balance-Leahy Scale: Good Standing balance comment: steady, limited by pain and decreased weight shifting                    Cognition Arousal/Alertness: Awake/alert Behavior During Therapy: Flat affect Overall Cognitive Status: Within Functional Limits for tasks assessed                      Exercises Other Exercises Other Exercises: PT adjusted pt's personal crutches to fit her correctly for proper gait mechanics and safety. Adjusted over all height and position of hand grip to facilitate adequate use of UEs to bear down during mobility tasks. Pt felt comfortable with crutches after adjustment. Other Exercises: Pt ambulated 30 ft with crutches and supervision with PT providing cues for proper sequence of crutches and advancing L then R LE to maintain PWB status. Pt steady with no LOB including turns.    General Comments        Pertinent Vitals/Pain Pain Assessment:  ("It's feeling better")    Home Living                      Prior Function            PT Goals (current goals can now be found in the care plan section) Acute Rehab PT Goals Patient Stated  Goal: to get stronger PT Goal Formulation: With patient Time For Goal Achievement: 11/15/15 Potential to Achieve Goals: Fair Progress towards PT goals: Progressing toward goals    Frequency  Min 2X/week    PT Plan Current plan remains appropriate    Co-evaluation             End of Session   Activity Tolerance: Patient tolerated treatment well Patient left: Other (comment) (Pt on toilet. RN to return to room.)     Time: [id number] PT Time Calculation (min) (ACUTE ONLY): 10  min  Charges:  $Therapeutic Activity: 8-22 mins                    G Codes:      Adelene IdlerMindy Jo Elizeo Rodriques, PT, DPT  11/01/2015, 3:55 PM [phone]

## 2015-11-01 NOTE — Progress Notes (Addendum)
Physical Therapy Treatment Patient Details Name: Sandy Mason MRN: [id number] DOB: 05/09/1976 Today's Date: 11/01/2015    History of Present Illness 40 yo F presented to ED on 4/3 due to L leg pain after a fall on 3/31. She was found to have a closed pelvic fx. PMH includes CVA, depression, suicide attempt, PTSD, and substance abuse.    PT Comments    Pt was provided extensive education on navigating stairs including a handout and demonstration, with pt repeating sequence verbally. Also reviewed pt's new PWB orders from ortho MD for L LE and how to maintain WBS with use of crutches. Pt verbalized understanding of education provided and has no additional concerns or questions at this time. She will benefit from continued skilled PT to increase functional mobility and progress towards PLOF.  Follow Up Recommendations  Outpatient PT     Equipment Recommendations  Crutches    Recommendations for Other Services       Precautions / Restrictions Precautions Precautions: Fall Restrictions Weight Bearing Restrictions: Yes LLE Weight Bearing: Partial weight bearing    Mobility  Bed Mobility Overal bed mobility: Modified Independent                Transfers                    Ambulation/Gait                 Stairs  Provided educational material. See below.          Wheelchair Mobility    Modified Rankin (Stroke Patients Only)       Balance                                    Cognition Arousal/Alertness: Awake/alert Behavior During Therapy: Flat affect Overall Cognitive Status: Within Functional Limits for tasks assessed                      Exercises Other Exercises Other Exercises: Extensive pt education on how to navigate stairs with crutches. PT provided a handout and demonstrated the sequence of ascneding and descending stairs safely. PT offered to take pt to practice but she declined and felt comfortable with  the instructions on the handout. Pt repeated verbally the sequence. PT also educated pt on the new WBS per ortho MD consult of PWB on L LE. Educated pt what PWB is and how to maintain it with using crutches. Pt verbalized understanding of education provided and has no more questions or concerns in relation to PT.    General Comments        Pertinent Vitals/Pain Pain Assessment:  ("It's feeling better")    Home Living                      Prior Function            PT Goals (current goals can now be found in the care plan section) Acute Rehab PT Goals Patient Stated Goal: to get stronger PT Goal Formulation: With patient Time For Goal Achievement: 11/15/15 Potential to Achieve Goals: Fair Progress towards PT goals: Progressing toward goals    Frequency  Min 2X/week    PT Plan Current plan remains appropriate    Co-evaluation             End of Session  Time: 1610-9604 PT Time Calculation (min) (ACUTE ONLY): 12 min  Charges:  $Therapeutic Activity: 8-22 mins                    G Codes:      Adelene Idler, PT, DPT  11/01/2015, 3:20 PM [phone]

## 2015-11-01 NOTE — Clinical Social Work Note (Signed)
Clinical Social Work Assessment  Patient Details  Name: Sandy Mason MRN: [id number] Date of Birth: 09/25/1975  Date of referral:  11/01/15               Reason for consult:  Financial Concerns, Insurance Barriers, Substance Use/ETOH Abuse                Permission sought to share information with:  Case Manager Permission granted to share information::  Yes, Verbal Permission Granted  Name::        Agency::     Relationship::     Contact Information:     Housing/Transportation Living arrangements for the past 2 months:  Single Family Home Source of Information:  Patient Patient Interpreter Needed:  None Criminal Activity/Legal Involvement Pertinent to Current Situation/Hospitalization:  No - Comment as needed Significant Relationships:  Friend Lives with:  Roommate Do you feel safe going back to the place where you live?  Yes Need for family participation in patient care:  Yes (Comment)  Care giving concerns:  Patient lives in [place] with her friend and her friend's mother. Per patient she does not work right now and will look for a job when she heals from this injury.     Social Worker assessment / plan:  Holiday representative (CSW) received consult from Chief Strategy Officer for ETOH and new pelvic fracture. CSW met with patient alone at bedside. Patient was alert and oriented and was sitting up in the bed. CSW introduced self and explained role of CSW department. Patient reported that she recently moved back to [place] from [facility]. Per patient her husband passed away due to an overdose and she receives survivor benefits. Patient reported that she has children that do not live with her. Patient did not elaborate on her children's location when CSW asked. Patient reported that she is 7 months sober and started her recovery in [place]. Patient denied drug and alcohol use at this time. Patient was staying at the [facility] in [place]. Per patient she moved out of  the [facility] because she wanted to remove herself from some drama between other women. Patient reported that she sustained her pelvic fracture because she fell off a banister on a porch. Patient broke her phone screen during the fall as well. CSW provided emotional support and outpatient substance abuse resources in [place]. Patient appeared motivated to follow up with [facility] or [facility]. CSW also provided patient with general resources for [facility]. Patient requested a bed side commode, crutches and a med management referral. PT is recommending outpatient PT. RN Case Manager aware of above. Per patient she has a strong support system and has no other needs at this time. Please reconsult if future social work needs arise.   Employment status:  Temporary, Unemployed Insurance information:  Self Pay (Medicaid Pending) PT Recommendations:   (Outpatient PT ) Information / Referral to community resources:  Outpatient Substance Abuse Treatment Options  Patient/Family's Response to care:  Patient appeared motivated to follow up with outpatient substance abuse resources.   Patient/Family's Understanding of and Emotional Response to Diagnosis, Current Treatment, and Prognosis: Patient was pleasant throughout assessment and thanked CSW for visit.   Emotional Assessment Appearance:  Appears stated age Attitude/Demeanor/Rapport:  Guarded Affect (typically observed):  Adaptable, Pleasant Orientation:  Oriented to Self, Oriented to Place, Oriented to  Time, Oriented to Situation Alcohol / Substance use:  Alcohol Use Psych involvement (Current and /or in the community):  No (  Comment)  Discharge Needs  Concerns to be addressed:  Discharge Planning Concerns Readmission within the last 30 days:  No Current discharge risk:  Inadequate Financial Supports, Substance Abuse Barriers to Discharge:  Continued Medical Work up   Loralyn Freshwater, LCSW 11/01/2015, 11:01 AM

## 2015-11-01 NOTE — Progress Notes (Signed)
Patient discharged home at 1550, verbalized understanding of discharge instruction and agree to get over the counter stool softener. Family will provide transportation home. patient in stable condition at discharge

## 2015-11-01 NOTE — Discharge Instructions (Signed)
Non weight bearing on left side for 4-6 weeks.

## 2015-11-01 NOTE — Evaluation (Addendum)
Physical Therapy Evaluation Patient Details Name: Sandy LieuJennifer J Behe MRN: [id number] DOB: 1975/12/27 Today's Date: 11/01/2015   History of Present Illness  40 yo F presented to ED on 4/3 due to L leg pain after a fall on 3/31. She was found to have a closed pelvic fx. PMH includes CVA, depression, suicide attempt, PTSD, and substance abuse.  Clinical Impression  Pt demonstrated generalized weakness (limited by pain in LEs) and difficulty walking. She required min guard for transfers and limited ambulation with crutches. Cues required for proper use of AD and sequence to ambulate/transfer safely. Her biggest limiting factor is L>R hip and groin pain. She needs further instruction/practice with crutches and step navigation. OPPT recommended to continue gait and strength training to return to PLOF.     Follow Up Recommendations Outpatient PT    Equipment Recommendations  Crutches    Recommendations for Other Services OT consult (for potential need for equipment)     Precautions / Restrictions Precautions Precautions: Fall Restrictions Weight Bearing Restrictions: No      Mobility  Bed Mobility Overal bed mobility: Modified Independent                Transfers Overall transfer level: Needs assistance Equipment used: Rolling walker (2 wheeled);Crutches Transfers: Sit to/from [employer];Stand Pivot Transfers Sit to Stand: Min guard Stand pivot transfers: Min guard       General transfer comment: cues for hand placement, technique and safety  Ambulation/Gait Ambulation/Gait assistance: Min guard Ambulation Distance (Feet): 20 Feet Assistive device: Rolling walker (2 wheeled);Crutches Gait Pattern/deviations: Step-to pattern;Decreased weight shift to left;Antalgic Gait velocity: reduced Gait velocity interpretation: Below normal speed for age/gender General Gait Details: Cues provided for using UEs to push down onto crutches/FWW to reduce weight bearing to reduce pain in LEs.    Stairs            Wheelchair Mobility    Modified Rankin (Stroke Patients Only)       Balance Overall balance assessment: Needs assistance;History of Falls Sitting-balance support: No upper extremity supported Sitting balance-Leahy Scale: Good     Standing balance support: No upper extremity supported Standing balance-Leahy Scale: Fair Standing balance comment: steady, limited by pain and decreased weight shifting                             Pertinent Vitals/Pain Pain Assessment: 0-10 Pain Score: 7  Pain Location: L>R hip Pain Descriptors / Indicators: Aching Pain Intervention(s): Limited activity within patient's tolerance;Monitored during session;Premedicated before session    Home Living Family/patient expects to be discharged to:: Private residence Living Arrangements: Non-relatives/Friends Available Help at Discharge: Friend(s) Type of Home: House Home Access: Stairs to enter Entrance Stairs-Rails: None Entrance Stairs-Number of Steps: 2 Home Layout: One level (toilet is on a higher step up) Home Equipment: None Additional Comments: Pt just moved into a new place with her roommate and mother.    Prior Function Level of Independence: Independent         Comments: I with ADLs, was about to begin a new job     Higher education careers adviserHand Dominance        Extremity/Trunk Assessment               Lower Extremity Assessment: Generalized weakness;RLE deficits/detail;LLE deficits/detail         Communication   Communication: No difficulties  Cognition Arousal/Alertness: Awake/alert Behavior During Therapy: Flat affect Overall Cognitive Status: Within Functional Limits for tasks assessed  General Comments      Exercises Other Exercises Other Exercises: Pt ambulated 10 ft with FWW and 20 ft with crutches and min  guard. Cues given for step to gait pattern, pushing with UEs to reduce pain with weight bearing in  LEs,  and safe use of FWW/crutches to maximize stability and safety. Slow gait, generally steady with decreased ability to weight bear on L>R LE. Needs further instruction and practice with crutches.      Assessment/Plan    PT Assessment Patient needs continued PT services  PT Diagnosis Difficulty walking;Generalized weakness;Acute pain   PT Problem List Decreased strength;Decreased activity tolerance;Decreased balance;Decreased mobility;Decreased knowledge of use of DME;Pain  PT Treatment Interventions DME instruction;Gait training;Stair training;Therapeutic exercise;Therapeutic activities;Neuromuscular re-education;Patient/family education   PT Goals (Current goals can be found in the Care Plan section) Acute Rehab PT Goals Patient Stated Goal: to get stronger PT Goal Formulation: With patient Time For Goal Achievement: 11/15/15 Potential to Achieve Goals: Fair    Frequency Min 2X/week   Barriers to discharge Inaccessible home environment;Decreased caregiver support stairs to enter,     Co-evaluation               End of Session Equipment Utilized During Treatment: Gait belt Activity Tolerance: Patient limited by pain Patient left: in chair;with call bell/phone within reach;with chair alarm set Nurse Communication: Mobility status    Functional Assessment Tool Used: clinical judgement; 10 ft walk speed Functional Limitation: Mobility: Walking and moving around Mobility: Walking and Moving Around Current Status (O1308): At least 1 percent but less than 20 percent impaired, limited or restricted Mobility: Walking and Moving Around Goal Status [phone]): 0 percent impaired, limited or restricted    Time: 6962-9528 PT Time Calculation (min) (ACUTE ONLY): 23 min   Charges:   PT Evaluation $PT Eval Moderate Complexity: 1 Procedure PT Treatments $Gait Training: 8-22 mins   PT G Codes:   PT G-Codes **NOT FOR INPATIENT CLASS** Functional Assessment Tool Used: clinical  judgement; 10 ft walk speed Functional Limitation: Mobility: Walking and moving around Mobility: Walking and Moving Around Current Status (U1324): At least 1 percent but less than 20 percent impaired, limited or restricted Mobility: Walking and Moving Around Goal Status [phone]): 0 percent impaired, limited or restricted    Adelene Idler, PT, DPT  11/01/2015, 10:19 AM [phone]

## 2015-11-16 ENCOUNTER — Emergency Department
Admission: EM | Admit: 2015-11-16 | Discharge: 2015-11-16 | Disposition: A | Discharge status: Home / Self Care | Payer: MEDICAID | Attending: Emergency Medicine | Admitting: Emergency Medicine

## 2015-11-16 ENCOUNTER — Encounter: Payer: Self-pay | Admitting: Emergency Medicine

## 2015-11-16 DIAGNOSIS — Y929 Unspecified place or not applicable: Secondary | ICD-10-CM | POA: Insufficient documentation

## 2015-11-16 DIAGNOSIS — S32810D Multiple fractures of pelvis with stable disruption of pelvic ring, subsequent encounter for fracture with routine healing: Secondary | ICD-10-CM | POA: Insufficient documentation

## 2015-11-16 DIAGNOSIS — Z915 Personal history of self-harm: Secondary | ICD-10-CM | POA: Insufficient documentation

## 2015-11-16 DIAGNOSIS — F17210 Nicotine dependence, cigarettes, uncomplicated: Secondary | ICD-10-CM | POA: Insufficient documentation

## 2015-11-16 DIAGNOSIS — X58XXXA Exposure to other specified factors, initial encounter: Secondary | ICD-10-CM | POA: Insufficient documentation

## 2015-11-16 DIAGNOSIS — F329 Major depressive disorder, single episode, unspecified: Secondary | ICD-10-CM | POA: Insufficient documentation

## 2015-11-16 DIAGNOSIS — Y939 Activity, unspecified: Secondary | ICD-10-CM | POA: Insufficient documentation

## 2015-11-16 DIAGNOSIS — I639 Cerebral infarction, unspecified: Secondary | ICD-10-CM | POA: Insufficient documentation

## 2015-11-16 DIAGNOSIS — Z79899 Other long term (current) drug therapy: Secondary | ICD-10-CM | POA: Insufficient documentation

## 2015-11-16 DIAGNOSIS — Y999 Unspecified external cause status: Secondary | ICD-10-CM | POA: Insufficient documentation

## 2015-11-16 DIAGNOSIS — Z760 Encounter for issue of repeat prescription: Secondary | ICD-10-CM | POA: Insufficient documentation

## 2015-11-16 MED ORDER — BACLOFEN 10 MG PO TABS: 10.0000 mg | ORAL_TABLET | Freq: Three times a day (TID) | ORAL | Status: DC

## 2015-11-16 MED ORDER — HYDROCODONE-ACETAMINOPHEN 5-325 MG PO TABS: 2.0000 | ORAL_TABLET | Freq: Once | ORAL | Status: DC

## 2015-11-16 MED ORDER — IBUPROFEN 800 MG PO TABS: 800.0000 mg | ORAL_TABLET | Freq: Once | ORAL | Status: DC

## 2015-11-16 MED ORDER — MELOXICAM 15 MG PO TABS
15.0000 mg | ORAL_TABLET | Freq: Every day | ORAL | Status: DC
Start: 2015-11-16 — End: 2015-12-12

## 2015-11-16 NOTE — ED Provider Notes (Signed)
CSN: [id number]     Arrival date & time 11/16/15  1212 History   First MD Initiated Contact with Patient 11/16/15 1242     Chief Complaint  Patient presents with  . Back Pain     HPI   40 year old female who presents to the emergency department for medication refill. She sustained a pelvic fracture about 2 weeks ago and is now out of her pain medication. She has not scheduled a follow-up with orthopedics, she was advised to do upon [facility]. She has also not scheduled her [facility]. She is currently taking ibuprofen without relief.  Past Medical History  Diagnosis Date  . CVA (cerebral infarction)   . Seizures (HCC)   . Depression   . Anxiety   . Suicide attempt by hanging (HCC)   . Alcohol use disorder severe. 12/18/2014  . 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 Borderline personality disorder 12/18/2014  . Opioid use disorder, severe, dependence (HCC) 12/20/2014  . 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 PTSD (post-traumatic stress disorder) 03/28/2015   History reviewed. No pertinent past surgical history. Family History  Problem Relation Age of Onset  . Diabetes Father    Social History  Substance Use Topics  . Smoking status: Current Every Day Smoker -- 1.00 packs/day    Types: Cigarettes  . Smokeless tobacco: None  . Alcohol Use: 3.6 oz/week    6 Cans of beer per week     Comment: last used 6 months ago   OB History    Gravida Para Term Preterm AB TAB SAB Ectopic Multiple Living   2 2 2             Review of Systems  Constitutional: Negative for fever.  Respiratory: Negative for shortness of breath.   Gastrointestinal: Negative for nausea and vomiting.  Musculoskeletal: Positive for myalgias and arthralgias. Negative for joint swelling.  Skin: Negative for wound.      Allergies  Depakote; Haldol; Keflet; and  Nicoderm  Home Medications   Prior to Admission medications   Medication Sig Start Date End Date Taking? Authorizing Provider  baclofen (LIORESAL) 10 MG tablet Take 1 tablet (10 mg total) by mouth 3 (three) times daily. 11/16/15   Chinita Pesterari B Anthony Roland, FNP  busPIRone (BUSPAR) 10 MG tablet Take 10 mg by mouth 3 (three) times daily.    Historical Provider, MD  gabapentin (NEURONTIN) 300 MG capsule Take 900 mg by mouth 3 (three) times daily.    Historical Provider, MD  meloxicam (MOBIC) 15 MG tablet Take 1 tablet (15 mg total) by mouth daily. 11/16/15   Chinita Pesterari B Jany Buckwalter, FNP  mirtazapine (REMERON) 30 MG tablet Take 30 mg by mouth at bedtime.    Historical Provider, MD  oxyCODONE-acetaminophen (ROXICET) 5-325 MG tablet Take 1 tablet by mouth every 6 (six) hours as needed for moderate pain or severe pain. 11/01/15   Altamese DillingVaibhavkumar Vachhani, MD  prazosin (MINIPRESS) 1 MG capsule Take 2 capsules (2 mg total) by mouth at bedtime. 12/23/14   Shari ProwsJolanta B Pucilowska, MD  venlafaxine XR (EFFEXOR-XR) 75 MG 24 hr capsule Take 225 mg by mouth daily with breakfast.    Historical Provider, MD   BP 135/84 mmHg  Pulse 104  Temp(Src) 97.5 F (36.4 C) (Oral)  Resp 20  Ht 5\' 8"  (1.727 m)  Wt 107.049 kg  BMI 35.89 kg/m2  SpO2 98% Physical Exam  Constitutional:  She is oriented to person, place, and time. She appears well-developed and well-nourished.  HENT:  Head: Atraumatic.  Eyes: EOM are normal.  Neck: Normal range of motion.  Pulmonary/Chest: Effort normal.  Abdominal: Soft.  Musculoskeletal:  Limited range of motion of the left lower extremity due to pain and recent pelvic fracture.  Neurological: She is alert and oriented to person, place, and time.  Skin: Skin is warm and dry.  Psychiatric: Her behavior is normal. Thought content normal.  Nursing note and vitals reviewed.   ED Course  Procedures (including critical care time) Labs Review Labs Reviewed - No data to display  Imaging Review No results  found. I have personally reviewed and evaluated these images and lab results as part of my medical decision-making.   EKG Interpretation None      MDM   Final diagnoses:  Multiple closed fractures of pelvis with stable disruption of pelvic circle with routine healing, subsequent encounter    Patient was advised that she will need to call and schedule a follow-up appointment with Dr. Martha Clan for additional narcotic medication refills. She has a significant past medical history of attempted suicide by hanging, substance abuse, and alcoholism. [place] controlled substance registry viewed. Patient appears to be using multiple pharmacies, multiple providers, and multiple home addresses when filling substances. The patient was encouraged to be compliant and called the [facility] for her rehabilitation. She will be given prescriptions for meloxicam and baclofen to help with her pain. She was advised to stop taking the ibuprofen if taking the meloxicam.    Chinita Pester, FNP 11/16/15 1329  Myrna Blazer, MD 11/16/15 [phone]

## 2015-11-16 NOTE — ED Notes (Signed)
States diagnosed with pelvic fracture 2 weeks ago. States has had no new injury, is here for pain control for same.

## 2015-11-16 NOTE — ED Notes (Signed)
States has [facility] [place] on Friday. Needs pain medication to take her to appointment. States currently using Ibuprofen without relief.

## 2015-11-16 NOTE — ED Notes (Signed)
Patient here for pain control. Denies any new injury.

## 2015-11-17 DIAGNOSIS — Z8781 Personal history of (healed) traumatic fracture: Secondary | ICD-10-CM | POA: Insufficient documentation

## 2015-12-10 ENCOUNTER — Observation Stay
Admission: EM | Admit: 2015-12-10 | Discharge: 2015-12-12 | Disposition: A | Discharge status: Home / Self Care | Payer: Self-pay | Attending: Internal Medicine | Admitting: Internal Medicine

## 2015-12-10 ENCOUNTER — Emergency Department: Payer: Self-pay

## 2015-12-10 ENCOUNTER — Encounter: Payer: Self-pay | Admitting: Emergency Medicine

## 2015-12-10 DIAGNOSIS — R112 Nausea with vomiting, unspecified: Secondary | ICD-10-CM | POA: Insufficient documentation

## 2015-12-10 DIAGNOSIS — Z791 Long term (current) use of non-steroidal anti-inflammatories (NSAID): Secondary | ICD-10-CM | POA: Insufficient documentation

## 2015-12-10 DIAGNOSIS — Z8673 Personal history of transient ischemic attack (TIA), and cerebral infarction without residual deficits: Secondary | ICD-10-CM | POA: Insufficient documentation

## 2015-12-10 DIAGNOSIS — E876 Hypokalemia: Secondary | ICD-10-CM | POA: Insufficient documentation

## 2015-12-10 DIAGNOSIS — Z888 Allergy status to other drugs, medicaments and biological substances status: Secondary | ICD-10-CM | POA: Insufficient documentation

## 2015-12-10 DIAGNOSIS — Z79899 Other long term (current) drug therapy: Secondary | ICD-10-CM | POA: Insufficient documentation

## 2015-12-10 DIAGNOSIS — Z881 Allergy status to other antibiotic agents status: Secondary | ICD-10-CM | POA: Insufficient documentation

## 2015-12-10 DIAGNOSIS — F329 Major depressive disorder, single episode, unspecified: Secondary | ICD-10-CM | POA: Insufficient documentation

## 2015-12-10 DIAGNOSIS — R45851 Suicidal ideations: Secondary | ICD-10-CM | POA: Insufficient documentation

## 2015-12-10 DIAGNOSIS — F1020 Alcohol dependence, uncomplicated: Secondary | ICD-10-CM | POA: Diagnosis present

## 2015-12-10 DIAGNOSIS — Z833 Family history of diabetes mellitus: Secondary | ICD-10-CM | POA: Insufficient documentation

## 2015-12-10 DIAGNOSIS — Z79891 Long term (current) use of opiate analgesic: Secondary | ICD-10-CM | POA: Insufficient documentation

## 2015-12-10 DIAGNOSIS — F603 Borderline personality disorder: Secondary | ICD-10-CM | POA: Insufficient documentation

## 2015-12-10 DIAGNOSIS — F419 Anxiety disorder, unspecified: Secondary | ICD-10-CM | POA: Insufficient documentation

## 2015-12-10 DIAGNOSIS — R441 Visual hallucinations: Secondary | ICD-10-CM | POA: Insufficient documentation

## 2015-12-10 DIAGNOSIS — F4310 Post-traumatic stress disorder, unspecified: Principal | ICD-10-CM | POA: Insufficient documentation

## 2015-12-10 DIAGNOSIS — F17210 Nicotine dependence, cigarettes, uncomplicated: Secondary | ICD-10-CM | POA: Insufficient documentation

## 2015-12-10 DIAGNOSIS — D72829 Elevated white blood cell count, unspecified: Secondary | ICD-10-CM | POA: Insufficient documentation

## 2015-12-10 DIAGNOSIS — N390 Urinary tract infection, site not specified: Secondary | ICD-10-CM | POA: Insufficient documentation

## 2015-12-10 LAB — URINE DRUG SCREEN, QUALITATIVE (ARMC ONLY)
Amphetamines, Ur Screen: NOT DETECTED
BARBITURATES, UR SCREEN: NOT DETECTED
Benzodiazepine, Ur Scrn: NOT DETECTED
CANNABINOID 50 NG, UR ~~LOC~~: NOT DETECTED
COCAINE METABOLITE, UR ~~LOC~~: POSITIVE — AB
MDMA (ECSTASY) UR SCREEN: NOT DETECTED
Methadone Scn, Ur: NOT DETECTED
OPIATE, UR SCREEN: NOT DETECTED
Phencyclidine (PCP) Ur S: NOT DETECTED
TRICYCLIC, UR SCREEN: NOT DETECTED

## 2015-12-10 LAB — CBC
HCT: 40 % (ref 35.0–47.0)
Hemoglobin: 13.6 g/dL (ref 12.0–16.0)
MCH: 31.4 pg (ref 26.0–34.0)
MCHC: 34 g/dL (ref 32.0–36.0)
MCV: 92.4 fL (ref 80.0–100.0)
PLATELETS: 330 10*3/uL (ref 150–440)
RBC: 4.32 MIL/uL (ref 3.80–5.20)
RDW: 12.3 % (ref 11.5–14.5)
WBC: 15.7 10*3/uL — AB (ref 3.6–11.0)

## 2015-12-10 LAB — COMPREHENSIVE METABOLIC PANEL
ALT: 30 U/L (ref 14–54)
AST: 29 U/L (ref 15–41)
Albumin: 4.3 g/dL (ref 3.5–5.0)
Alkaline Phosphatase: 75 U/L (ref 38–126)
Anion gap: 18 — ABNORMAL HIGH (ref 5–15)
BILIRUBIN TOTAL: 0.6 mg/dL (ref 0.3–1.2)
BUN: 10 mg/dL (ref 6–20)
CALCIUM: 8.7 mg/dL — AB (ref 8.9–10.3)
CHLORIDE: 106 mmol/L (ref 101–111)
CO2: 14 mmol/L — ABNORMAL LOW (ref 22–32)
CREATININE: 0.9 mg/dL (ref 0.44–1.00)
Glucose, Bld: 160 mg/dL — ABNORMAL HIGH (ref 65–99)
Potassium: 3.2 mmol/L — ABNORMAL LOW (ref 3.5–5.1)
Sodium: 138 mmol/L (ref 135–145)
TOTAL PROTEIN: 8.4 g/dL — AB (ref 6.5–8.1)

## 2015-12-10 LAB — URINALYSIS COMPLETE WITH MICROSCOPIC (ARMC ONLY)
BILIRUBIN URINE: NEGATIVE
GLUCOSE, UA: NEGATIVE mg/dL
HGB URINE DIPSTICK: NEGATIVE
NITRITE: NEGATIVE
Protein, ur: 30 mg/dL — AB
SPECIFIC GRAVITY, URINE: 1.024 (ref 1.005–1.030)
pH: 5 (ref 5.0–8.0)

## 2015-12-10 LAB — LIPASE, BLOOD: LIPASE: 27 U/L (ref 11–51)

## 2015-12-10 LAB — TROPONIN I

## 2015-12-10 LAB — POCT PREGNANCY, URINE: PREG TEST UR: NEGATIVE

## 2015-12-10 LAB — LACTIC ACID, PLASMA: Lactic Acid, Venous: 1.8 mmol/L (ref 0.5–2.0)

## 2015-12-10 MED ORDER — ONDANSETRON HCL 4 MG/2ML IJ SOLN: 4.0000 mg | Freq: Four times a day (QID) | INTRAMUSCULAR | Status: DC | PRN

## 2015-12-10 MED ORDER — ACETAMINOPHEN 325 MG PO TABS
650.0000 mg | ORAL_TABLET | Freq: Four times a day (QID) | ORAL | Status: DC | PRN
  Administered 2015-12-11: 650 mg via ORAL
  Filled 2015-12-10 (×2): qty 2

## 2015-12-10 MED ORDER — LORAZEPAM 2 MG/ML IJ SOLN
INTRAMUSCULAR | Status: AC
  Administered 2015-12-10: 1 mg via INTRAVENOUS
  Filled 2015-12-10: qty 1

## 2015-12-10 MED ORDER — LORAZEPAM 2 MG/ML IJ SOLN
1.0000 mg | Freq: Once | INTRAMUSCULAR | Status: AC
  Administered 2015-12-10: 1 mg via INTRAVENOUS

## 2015-12-10 MED ORDER — BACLOFEN 10 MG PO TABS
10.0000 mg | ORAL_TABLET | Freq: Three times a day (TID) | ORAL | Status: DC
Start: 2015-12-10 — End: 2015-12-12
  Administered 2015-12-10 – 2015-12-11 (×6): 10 mg via ORAL
  Filled 2015-12-10 (×6): qty 1

## 2015-12-10 MED ORDER — LORAZEPAM 2 MG/ML IJ SOLN
0.5000 mg | Freq: Four times a day (QID) | INTRAMUSCULAR | Status: DC | PRN
  Administered 2015-12-10 (×2): 0.5 mg via INTRAVENOUS
  Filled 2015-12-10 (×2): qty 1

## 2015-12-10 MED ORDER — POTASSIUM CHLORIDE CRYS ER 20 MEQ PO TBCR
40.0000 meq | EXTENDED_RELEASE_TABLET | Freq: Once | ORAL | Status: AC
  Administered 2015-12-10: 40 meq via ORAL
  Filled 2015-12-10: qty 2

## 2015-12-10 MED ORDER — POTASSIUM CHLORIDE IN NACL 20-0.9 MEQ/L-% IV SOLN
INTRAVENOUS | Status: DC
  Administered 2015-12-10: 12:00:00 via INTRAVENOUS
  Filled 2015-12-10 (×3): qty 1000

## 2015-12-10 MED ORDER — ENOXAPARIN SODIUM 40 MG/0.4ML ~~LOC~~ SOLN
40.0000 mg | SUBCUTANEOUS | Status: DC
  Administered 2015-12-10 – 2015-12-11 (×2): 40 mg via SUBCUTANEOUS
  Filled 2015-12-10 (×2): qty 0.4

## 2015-12-10 MED ORDER — METOCLOPRAMIDE HCL 5 MG/ML IJ SOLN
10.0000 mg | Freq: Once | INTRAMUSCULAR | Status: AC
  Administered 2015-12-10: 10 mg via INTRAVENOUS
  Filled 2015-12-10: qty 2

## 2015-12-10 MED ORDER — POTASSIUM CHLORIDE 10 MEQ/100ML IV SOLN
10.0000 meq | Freq: Once | INTRAVENOUS | Status: AC
  Administered 2015-12-10: 10 meq via INTRAVENOUS
  Filled 2015-12-10: qty 100

## 2015-12-10 MED ORDER — SODIUM CHLORIDE 0.9 % IV SOLN
INTRAVENOUS | Status: DC
  Administered 2015-12-10 – 2015-12-11 (×2): via INTRAVENOUS

## 2015-12-10 MED ORDER — BUSPIRONE HCL 10 MG PO TABS
10.0000 mg | ORAL_TABLET | Freq: Three times a day (TID) | ORAL | Status: DC
  Administered 2015-12-10: 10 mg via ORAL
  Filled 2015-12-10: qty 1

## 2015-12-10 MED ORDER — ONDANSETRON HCL 4 MG/2ML IJ SOLN
INTRAMUSCULAR | Status: AC
  Administered 2015-12-10: 4 mg via INTRAVENOUS
  Filled 2015-12-10: qty 2

## 2015-12-10 MED ORDER — PRAZOSIN HCL 2 MG PO CAPS
2.0000 mg | ORAL_CAPSULE | Freq: Every day | ORAL | Status: DC
  Administered 2015-12-10 – 2015-12-11 (×2): 2 mg via ORAL
  Filled 2015-12-10 (×2): qty 1

## 2015-12-10 MED ORDER — NITROFURANTOIN MONOHYD MACRO 100 MG PO CAPS
100.0000 mg | ORAL_CAPSULE | Freq: Two times a day (BID) | ORAL | Status: DC
  Administered 2015-12-10 – 2015-12-11 (×4): 100 mg via ORAL
  Filled 2015-12-10 (×5): qty 1

## 2015-12-10 MED ORDER — ONDANSETRON HCL 4 MG/2ML IJ SOLN
4.0000 mg | Freq: Once | INTRAMUSCULAR | Status: AC
  Administered 2015-12-10: 4 mg via INTRAVENOUS
  Filled 2015-12-10: qty 2

## 2015-12-10 MED ORDER — POTASSIUM CHLORIDE 10 MEQ/100ML IV SOLN
10.0000 meq | INTRAVENOUS | Status: AC
  Filled 2015-12-10 (×2): qty 100

## 2015-12-10 MED ORDER — SENNOSIDES-DOCUSATE SODIUM 8.6-50 MG PO TABS: 1.0000 | ORAL_TABLET | Freq: Every evening | ORAL | Status: DC | PRN

## 2015-12-10 MED ORDER — HYDROCODONE-ACETAMINOPHEN 5-325 MG PO TABS
1.0000 | ORAL_TABLET | ORAL | Status: DC | PRN
  Administered 2015-12-10 – 2015-12-11 (×5): 2 via ORAL
  Filled 2015-12-10 (×5): qty 2

## 2015-12-10 MED ORDER — ONDANSETRON HCL 4 MG PO TABS: 4.0000 mg | ORAL_TABLET | Freq: Four times a day (QID) | ORAL | Status: DC | PRN

## 2015-12-10 MED ORDER — VENLAFAXINE HCL ER 75 MG PO CP24
225.0000 mg | ORAL_CAPSULE | Freq: Every day | ORAL | Status: DC
  Administered 2015-12-10 – 2015-12-11 (×2): 225 mg via ORAL
  Filled 2015-12-10 (×2): qty 3

## 2015-12-10 MED ORDER — RISPERIDONE 0.5 MG PO TABS
0.5000 mg | ORAL_TABLET | Freq: Two times a day (BID) | ORAL | Status: DC
  Administered 2015-12-10 – 2015-12-11 (×3): 0.5 mg via ORAL
  Filled 2015-12-10 (×4): qty 1

## 2015-12-10 MED ORDER — KETOROLAC TROMETHAMINE 30 MG/ML IJ SOLN
30.0000 mg | Freq: Once | INTRAMUSCULAR | Status: AC
  Administered 2015-12-10: 30 mg via INTRAVENOUS
  Filled 2015-12-10: qty 1

## 2015-12-10 MED ORDER — SODIUM CHLORIDE 0.9% FLUSH
3.0000 mL | Freq: Two times a day (BID) | INTRAVENOUS | Status: DC
  Administered 2015-12-11: 3 mL via INTRAVENOUS

## 2015-12-10 MED ORDER — MIRTAZAPINE 15 MG PO TABS: 30.0000 mg | ORAL_TABLET | Freq: Every day | ORAL | Status: DC

## 2015-12-10 MED ORDER — NICOTINE 21 MG/24HR TD PT24
21.0000 mg | MEDICATED_PATCH | Freq: Every day | TRANSDERMAL | Status: DC
  Administered 2015-12-10 – 2015-12-11 (×2): 21 mg via TRANSDERMAL
  Filled 2015-12-10 (×2): qty 1

## 2015-12-10 MED ORDER — ACETAMINOPHEN 650 MG RE SUPP: 650.0000 mg | Freq: Four times a day (QID) | RECTAL | Status: DC | PRN

## 2015-12-10 MED ORDER — SODIUM CHLORIDE 0.9 % IV BOLUS (SEPSIS)
1000.0000 mL | Freq: Once | INTRAVENOUS | Status: AC
  Administered 2015-12-10: 1000 mL via INTRAVENOUS

## 2015-12-10 MED ORDER — LORAZEPAM 2 MG/ML IJ SOLN
INTRAMUSCULAR | Status: AC
  Filled 2015-12-10: qty 1

## 2015-12-10 MED ORDER — GABAPENTIN 300 MG PO CAPS
900.0000 mg | ORAL_CAPSULE | Freq: Three times a day (TID) | ORAL | Status: DC
  Administered 2015-12-10 – 2015-12-11 (×6): 900 mg via ORAL
  Filled 2015-12-10 (×6): qty 3

## 2015-12-10 MED ORDER — NICOTINE POLACRILEX 2 MG MT GUM
2.0000 mg | CHEWING_GUM | OROMUCOSAL | Status: DC | PRN
  Filled 2015-12-10: qty 1

## 2015-12-10 MED ORDER — ONDANSETRON HCL 4 MG/2ML IJ SOLN
4.0000 mg | Freq: Once | INTRAMUSCULAR | Status: AC | PRN
  Administered 2015-12-10: 4 mg via INTRAVENOUS

## 2015-12-10 NOTE — H&P (Signed)
[facility] [place] [facility]   PATIENT NAME: Sandy JaffeJennifer Mason    MR#:  [id number]  DATE OF BIRTH:  1975/12/01  DATE OF ADMISSION:  12/10/2015  PRIMARY CARE PHYSICIAN: No primary care provider on file.   REQUESTING/REFERRING PHYSICIAN:  Dr Shaune PollackLord  CHIEF COMPLAINT:   nauseaAnd vomiting  HISTORY OF PRESENT ILLNESS:  Sandy JaffeJennifer Mason  is a 40 y.o. 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 Patient presented past day she's had nausea and vomiting. Her urine toxicology was positive for cocaine for which she says she has not used cocaine and does not know how it got into her urine system. She also states that she has been compliant with her medications but in the past has not been taking her medications. She reports no other sick contacts, fever or chills. She reports no diarrhea. She also states that she is having an out of body experience and feels like she wants to harm herself.  PAST MEDICAL HISTORY:   Past Medical History  Diagnosis Date  . CVA (cerebral infarction)   . Seizures (HCC)   . Depression   . Anxiety   . Suicide attempt by hanging (HCC)   . Alcohol use disorder severe. 12/18/2014  . 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 Borderline personality disorder 12/18/2014  . Opioid use disorder, severe, dependence (HCC) 12/20/2014  . 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 PTSD (post-traumatic stress disorder) 03/28/2015    PAST SURGICAL HISTORY:  History reviewed. No pertinent past surgical history.  SOCIAL HISTORY:   Social History  Substance Use Topics  . Smoking status: Current Every Day Smoker -- 1.00 packs/day    Types: Cigarettes  . Smokeless tobacco: Not on file  . Alcohol Use: 3.6 oz/week    6 Cans of beer per week     Comment: last used 6 months ago    FAMILY  HISTORY:   Family History  Problem Relation Age of Onset  . Diabetes Father     DRUG ALLERGIES:   Allergies  Allergen Reactions  . Depakote [Valproic Acid] Other (See Comments)    Reaction:  Seizures   . Haldol [Haloperidol Lactate] Other (See Comments)    Reaction:  Seizures   . Keflet [Cephalexin] Rash  . Nicoderm [Nicotine] Rash    REVIEW OF SYSTEMS:   Review of Systems  Constitutional: Negative for fever, chills and malaise/fatigue.  HENT: Negative for ear discharge, ear pain, hearing loss, nosebleeds and sore throat.   Eyes: Negative for blurred vision and pain.  Respiratory: Negative for cough, hemoptysis, shortness of breath and wheezing.   Cardiovascular: Negative for chest pain, palpitations and leg swelling.  Gastrointestinal: Positive for nausea and vomiting. Negative for abdominal pain, diarrhea and blood in stool.  Genitourinary: Negative for dysuria.  Musculoskeletal: Negative for back pain.  Neurological: Negative for dizziness, tremors, speech change, focal weakness, seizures and headaches.  Endo/Heme/Allergies: Does not bruise/bleed easily.  Psychiatric/Behavioral: Positive for suicidal ideas, hallucinations and substance abuse. Negative for depression. The patient is nervous/anxious and has insomnia.     MEDICATIONS AT HOME:   Prior to Admission medications   Medication Sig Start Date End Date Taking? Authorizing Provider  b complex vitamins capsule Take 1 capsule by mouth daily.   Yes Historical Provider, MD  baclofen (LIORESAL) 10 MG tablet Take 1 tablet (10 mg total) by mouth 3 (  three) times daily. 11/16/15  Yes Cari B Triplett, FNP  gabapentin (NEURONTIN) 300 MG capsule Take 900 mg by mouth 3 (three) times daily.   Yes Historical Provider, MD  lidocaine (LIDODERM) 5 % Place 2 patches onto the skin daily. Apply patch to the most painful area for up to 12 hours in a 24 hour period. 12/01/15 12/31/15 Yes Historical Provider, MD  mirtazapine (REMERON) 30 MG  tablet Take 30 mg by mouth at bedtime.   Yes Historical Provider, MD  oxyCODONE-acetaminophen (ROXICET) 5-325 MG tablet Take 1 tablet by mouth every 6 (six) hours as needed for moderate pain or severe pain. 11/01/15  Yes Altamese Dilling, MD  prazosin (MINIPRESS) 1 MG capsule Take 2 capsules (2 mg total) by mouth at bedtime. 12/23/14  Yes Shari Prows, MD  venlafaxine XR (EFFEXOR-XR) 75 MG 24 hr capsule Take 225 mg by mouth daily with breakfast.   Yes Historical Provider, MD  Vitamin D, Ergocalciferol, (DRISDOL) 50000 units CAPS capsule Take 50,000 Units by mouth once a week. 12/01/15 12/31/15 Yes Historical Provider, MD  meloxicam (MOBIC) 15 MG tablet Take 1 tablet (15 mg total) by mouth daily. Patient not taking: Reported on 12/10/2015 11/16/15   Chinita Pester, FNP      VITAL SIGNS:  Blood pressure 120/60, pulse 123, temperature 98.1 F (36.7 C), temperature source Oral, resp. rate 20, height 5\' 8"  (1.727 m), weight 100.699 kg (222 lb), SpO2 99 %.  PHYSICAL EXAMINATION:   Physical Exam  Constitutional: She is oriented to person, place, and time and well-developed, well-nourished, and in no distress. No distress.  HENT:  Head: Normocephalic.  Eyes: No scleral icterus.  Neck: Normal range of motion. Neck supple. No JVD present. No tracheal deviation present.  Cardiovascular: Normal rate, regular rhythm and normal heart sounds.  Exam reveals no gallop and no friction rub.   No murmur heard. Pulmonary/Chest: Effort normal and breath sounds normal. No respiratory distress. She has no wheezes. She has no rales. She exhibits no tenderness.  Abdominal: Soft. Bowel sounds are normal. She exhibits no distension and no mass. There is no tenderness. There is no rebound and no guarding.  Musculoskeletal: Normal range of motion. She exhibits no edema.  Neurological: She is alert and oriented to person, place, and time.  Skin: Skin is warm. No rash noted. No erythema.  Psychiatric: Affect and  judgment normal.      LABORATORY PANEL:   CBC  Recent Labs Lab 12/10/15 0437  WBC 15.7*  HGB 13.6  HCT 40.0  PLT 330   ------------------------------------------------------------------------------------------------------------------  Chemistries   Recent Labs Lab 12/10/15 0437  NA 138  K 3.2*  CL 106  CO2 14*  GLUCOSE 160*  BUN 10  CREATININE 0.90  CALCIUM 8.7*  AST 29  ALT 30  ALKPHOS 75  BILITOT 0.6   ------------------------------------------------------------------------------------------------------------------  Cardiac Enzymes  Recent Labs Lab 12/10/15 0437  TROPONINI <0.03   ------------------------------------------------------------------------------------------------------------------  RADIOLOGY:  Dg Abd Acute W/chest  12/10/2015  CLINICAL DATA:  Vomiting for 1 day. EXAM: DG ABDOMEN ACUTE W/ 1V CHEST COMPARISON:  02/03/2013 FINDINGS: There is no evidence of dilated bowel loops or free intraperitoneal air. No radiopaque calculi or other significant radiographic abnormality is seen. Heart size and mediastinal contours are within normal limits. Both lungs are clear. IUD incidentally noted. IMPRESSION: Negative abdominal radiographs.  No acute cardiopulmonary disease. Electronically Signed   By: Ellery Plunk M.D.   On: 12/10/2015 06:14    EKG:   Sinus  tachycardia heart rate 119 no ST elevation or depression  IMPRESSION AND PLAN:   40 year old female who presents nausea and vomiting and suicidal ideation.  1. Nausea and vomiting: I'm suspecting this is due to withdrawal either from her routine psychiatric medications and or cocaine. Into the supportive care with antibiotics and IV fluids. . 2. Hypokalemia: Replete and recheck in a.m.  3. Leukocytosis: This may be from urinary tract infection. Repeat CBC in a.m.  4. Urinary tract infection: Start Macrobid for UTI. Patient is allergic to Keflex which causes a rash.  5. Suicidal ideation  with history of psychiatric disorder. Sitter at bedside and psych consult for review of psychiatric medications and further evaluation.   All the records are reviewed and case discussed with ED provider. Management plans discussed with the patient and she in agreement  CODE STATUS: FULL  TOTAL TIME TAKING CARE OF THIS PATIENT: 55 minutes.    Alayshia Marini M.D on 12/10/2015 at 10:54 AM  Between 7am to 6pm - Pager - [phone]  After 6pm go to [email or web address] - Social research officer, government  [facility]  Office  [phone]  CC: Primary care physician; No primary care provider on file.

## 2015-12-10 NOTE — ED Provider Notes (Signed)
[facility] Emergency Department Provider Note   ____________________________________________  Time seen: Approximately 5 AM I have reviewed the triage vital signs and the triage nursing note.  HISTORY  Chief Complaint Emesis   Historian Patient  HPI Sandy Mason is a 40 y.o. female who is here with vomiting since yesterday, numerous episodes of vomiting as well as dry heaving. Not black or bloody. She feels sore all over, but no particular abdominal pain. No fevers. Denies sinus congestion. She states that she feels like her hearing is muffled and she noticed a little bit of blood at the left ear. She states this is happening to her once before and was found be a potential side effect of Depakote and Haldol together.  States that she does feel a little anxious, but has never had a "full blown panic attack. "  She has had some diarrhea which was watery, followed by 1 episode with a small amount of blood on the paper.  She is feeling jittery what she is calling "convulsions. "  She does not think that she could be withdrawing from anything.  Denies sick contacts.  History of 2 previous strokes that affected her right upper extremity with weakness, which she has gained use back, but has some memory problems as a result. Prior strips did not give her symptoms like this and she is not having any new weakness or numbness.    Past Medical History  Diagnosis Date  . CVA (cerebral infarction)   . Seizures (HCC)   . Depression   . Anxiety   . Suicide attempt by hanging (HCC)   . Alcohol use disorder severe. 12/18/2014  . 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 Borderline personality disorder 12/18/2014  . Opioid use disorder, severe, dependence (HCC) 12/20/2014  . 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 PTSD (post-traumatic stress disorder) 03/28/2015    Patient  Active Problem List   Diagnosis Date Noted  . Nausea & vomiting 12/10/2015  . Closed pelvic fracture (HCC) 10/31/2015  . Fall 10/31/2015  . Tobacco abuse 10/31/2015  . Tobacco use disorder 03/28/2015  . PTSD (post-traumatic stress disorder) 03/28/2015  . Stimulant use disorder (cocaine) 03/28/2015  . 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 Opioid use disorder, severe, dependence (HCC) 12/20/2014  . 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 Alcohol use disorder severe. 12/18/2014  . Alcohol withdrawal (HCC) 12/18/2014  . Borderline personality disorder 12/18/2014    History reviewed. No pertinent past surgical history.  Current Outpatient Rx  Name  Route  Sig  Dispense  Refill  . baclofen (LIORESAL) 10 MG tablet   Oral   Take 1 tablet (10 mg total) by mouth 3 (three) times daily.   30 tablet   0   . busPIRone (BUSPAR) 10 MG tablet   Oral   Take 10 mg by mouth 3 (three) times daily.         Marland Kitchen gabapentin (NEURONTIN) 300 MG capsule   Oral   Take 900 mg by mouth 3 (three) times daily.         . mirtazapine (REMERON) 30 MG tablet   Oral   Take 30 mg by mouth at bedtime.         . prazosin (MINIPRESS) 1 MG capsule   Oral   Take 2 capsules (2 mg total) by mouth at bedtime.   60  capsule   0   . venlafaxine XR (EFFEXOR-XR) 75 MG 24 hr capsule   Oral   Take 225 mg by mouth daily with breakfast.         . meloxicam (MOBIC) 15 MG tablet   Oral   Take 1 tablet (15 mg total) by mouth daily.   30 tablet   0   . oxyCODONE-acetaminophen (ROXICET) 5-325 MG tablet   Oral   Take 1 tablet by mouth every 6 (six) hours as needed for moderate pain or severe pain.   40 tablet   0     Allergies Depakote; Haldol; Keflet; and Nicoderm  Family History  Problem Relation Age of Onset  . Diabetes Father     Social History Social History  Substance Use Topics  . Smoking status:  Current Every Day Smoker -- 1.00 packs/day    Types: Cigarettes  . Smokeless tobacco: None  . Alcohol Use: 3.6 oz/week    6 Cans of beer per week     Comment: last used 6 months ago    Review of Systems  Constitutional: Negative for fever. Eyes: Negative for visual changes. ENT: Negative for sore throat. Cardiovascular: Negative for chest pain. Respiratory: Negative for shortness of breath.Negative for cough Gastrointestinal: As per history of present illness Genitourinary: Negative for dysuria. Musculoskeletal: Negative for back pain. Skin: Negative for rash. Neurological: Negative for headache. 10 point Review of Systems otherwise negative ____________________________________________   PHYSICAL EXAM:  VITAL SIGNS: ED Triage Vitals  Enc Vitals Group     BP 12/10/15 0433 126/70 mmHg     Pulse Rate 12/10/15 0433 117     Resp 12/10/15 0433 19     Temp 12/10/15 0433 98.6 F (37 C)     Temp Source 12/10/15 0433 Oral     SpO2 12/10/15 0433 97 %     Weight 12/10/15 0433 222 lb (100.699 kg)     Height 12/10/15 0433 5\' 8"  (1.727 m)     Head Cir --      Peak Flow --      Pain Score 12/10/15 0433 10     Pain Loc --      Pain Edu? --      Excl. in GC? --      Constitutional: Alert and oriented. Appears somewhat anxious and actively heaving, with some pressured speech. HEENT   Head: Normocephalic and atraumatic.      Eyes: Conjunctivae are normal. PERRL. Normal extraocular movements.      Ears:   TMs clear bilaterally. Tiny little superficial scratch near the tragus on the left.      Nose: No congestion/rhinnorhea.   Mouth/Throat: Mucous membranes are mildly dry.   Neck: No stridor. Cardiovascular/Chest: Tachycardic, regular rhythm.  No murmurs, rubs, or gallops. Respiratory: Normal respiratory effort without tachypnea nor retractions. Breath sounds are clear and equal bilaterally. No wheezes/rales/rhonchi. Gastrointestinal: Soft. No distention, no guarding, no  rebound. Nontender.    Genitourinary/rectal:Deferred Musculoskeletal: Nontender with normal range of motion in all extremities. No joint effusions.  No lower extremity tenderness.  No edema. Neurologic:  Normal speech and language. No gross or focal neurologic deficits are appreciated.  Skin:  Skin is warm, dry and intact. No rash noted. Psychiatric: Somewhat pressured speech, mildly anxious, but no depressed mood  ____________________________________________   EKG I, Governor Rooksebecca Axcel Horsch, MD, the attending physician have personally viewed and interpreted all ECGs.  119 bpm. Sinus tachycardia. Narrow QRS. Normal axis. Nonspecific T-wave ____________________________________________  LABS (  pertinent positives/negatives)  Lipase 27 Comprehensive metabolic panel significant for potassium 3.2, CO2 14 Anion gap 18 White blood count 15.7, hemoglobin 13.6 and platelet count 330 Urinalysis  rare bacteria, 6-30 white blood cells, trace of sites, 6-30 red blood cells, 2+ ketones Urine drug screen cocaine positive Urine pregnancy test negative  ____________________________________________  RADIOLOGY All Xrays were viewed by me. Imaging interpreted by Radiologist.  Chest abdomen x-ray:  IMPRESSION: Negative abdominal radiographs. No acute cardiopulmonary disease. __________________________________________  PROCEDURES  Procedure(s) performed: None  Critical Care performed: None  ____________________________________________   ED COURSE / ASSESSMENT AND PLAN  Pertinent labs & imaging results that were available during my care of the patient were reviewed by me and considered in my medical decision making (see chart for details).   She is here with continued heaving and vomiting, but wife talked her chest seems like it's more anxiety and panic related. She is not really having abdominal pain to palpation. She hasn't had a fever. She is tachycardic. She could be hypovolemic, and I will give  her fluid bolus.  In terms of the nausea, as Zofran was given and following that with Ativan for what I think may be underlying panic/anxiety driving the vomiting.  I did ask her if she might be withdrawing from anything, and she denies this.  I'll be she's having underlying neurologic emergency or stroke as her neurologic exam is intact.  I'm not sure what to make of her complaint that she feels like her hearing is muffled. She does say that this feeling was similar to a prior episode which no certain cause was found, but which was suspected to be side effect due to Depakote and Haldol. She is not on these medications now. Her TMs are clear bilaterally. The complaining of bloody left ear discharge, appears to be coming from a tiny skin scratch.  After discussing with her again -- she was recently on baclofen and had an rx for ?30 tablets after a pelvic fracture -- she completed this within the past 2-3 days.  I think its possible she could be having withdrawal symptoms.  No confusion, altered mental status/delerium, muscle spasticity or seizures however.  On her laboratory studies patient thought he mildly hypokalemic to 3.2. When patient is able tolerate by mouth I will give her by mouth replacement.  Urinalysis consistent with urinary tract infection and elderly white blood cell count, I will go ahead and treat with Rocephin.  Urine culture will be sent.  Unclear etiology of the acidosis, I will add on alcohol level. Lactate is 1.8.  Patient has persistent vomiting despite numerous efforts at IV symptomatically treated. I will admit for intractable vomiting.  Nurse did inform me the patient stated that she was having some suicidal thoughts. I discussed with the patient and although she is not actively suicidal right now, and states the thoughts are essentially somewhat chronic, she does have a history of trying to harm herself in the ED in the past, and so I will place a sitter. She is going  to be admitted inpatient, and will have a psych consult as an inpatient.  CONSULTATIONS:   Dr. Juliene Pina, hospitalist for admission   Patient / Family / Caregiver informed of clinical course, medical decision-making process, and agree with plan.   ___________________________________________   FINAL CLINICAL IMPRESSION(S) / ED DIAGNOSES   Final diagnoses:  Intractable vomiting with nausea, unspecified vomiting type  UTI (lower urinary tract infection)  Hypokalemia  Note: This dictation was prepared with Dragon dictation. Any transcriptional errors that result from this process are unintentional   Governor Rooks, MD 12/10/15 [phone]

## 2015-12-10 NOTE — ED Notes (Signed)
Patient states "i was to the point before i called 911 where i was going to do something to myself because i was watching myself suffer" patient continued by saying "i need to be stabilized on my meds before i go home"  Patient states she has thoughts of harming herself at this time.  Patient states "there are a lot of unsure behavior im still having"  Patient has a plan however will not give details. "i have a plan, that's not the hard part"
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
## 2015-12-10 NOTE — ED Notes (Addendum)
PT to rm 24 via EMS from home.  EMS report pt vomiting x 1 day, too many times to count, per EMS pt report having "out of body" experience and panic attack as well.  Pt not vomiting upon arrival.  Pt reports 10/10 pain related to "body convulsions".  Pt also c/o rectal bleeding and bleeding from left ear, small abrasion noted with dried blood. PT NAD at this time.

## 2015-12-10 NOTE — Progress Notes (Signed)
Pt keeps saying that her IV site is hurting.  I have already changed the site.  It even hurts the new site with the NS with potassium infusing.  Dr Juliene PinaMody gave order for Potassium 40meq oral and change IVF to NS at 12125ml/hr

## 2015-12-10 NOTE — ED Notes (Signed)
Pt c/o of rectal bleeding, impaired hearing/bleeding of left ear, and continuing nausea/vomiting. MD Lord notified

## 2015-12-10 NOTE — Progress Notes (Addendum)
Pt is very anxious/restless.  Asking many questions.  Nothing ordered for anxiety.  Dr mody notified and ordered ativan as needed.  Nicotine supplements ordered

## 2015-12-11 LAB — BASIC METABOLIC PANEL
Anion gap: 7 (ref 5–15)
BUN: 8 mg/dL (ref 6–20)
CALCIUM: 7.9 mg/dL — AB (ref 8.9–10.3)
CO2: 20 mmol/L — AB (ref 22–32)
Chloride: 114 mmol/L — ABNORMAL HIGH (ref 101–111)
Creatinine, Ser: 0.72 mg/dL (ref 0.44–1.00)
GFR calc Af Amer: >60 mL/min (ref >60)
GLUCOSE: 90 mg/dL (ref 65–99)
POTASSIUM: 3.1 mmol/L — AB (ref 3.5–5.1)
Sodium: 141 mmol/L (ref 135–145)

## 2015-12-11 LAB — CBC
HEMATOCRIT: 33.9 % — AB (ref 35.0–47.0)
HEMOGLOBIN: 11.6 g/dL — AB (ref 12.0–16.0)
MCH: 32.3 pg (ref 26.0–34.0)
MCHC: 34.3 g/dL (ref 32.0–36.0)
MCV: 94 fL (ref 80.0–100.0)
Platelets: 283 10*3/uL (ref 150–440)
RBC: 3.6 MIL/uL — ABNORMAL LOW (ref 3.80–5.20)
RDW: 12.8 % (ref 11.5–14.5)
WBC: 7.7 10*3/uL (ref 3.6–11.0)

## 2015-12-11 MED ORDER — POTASSIUM CHLORIDE CRYS ER 20 MEQ PO TBCR
40.0000 meq | EXTENDED_RELEASE_TABLET | Freq: Once | ORAL | Status: AC
  Administered 2015-12-11: 40 meq via ORAL
  Filled 2015-12-11: qty 2

## 2015-12-11 MED ORDER — RISPERIDONE 1 MG PO TABS
1.0000 mg | ORAL_TABLET | Freq: Two times a day (BID) | ORAL | Status: DC
  Administered 2015-12-11: 1 mg via ORAL
  Filled 2015-12-11: qty 1

## 2015-12-11 MED ORDER — NICOTINE 21 MG/24HR TD PT24: 21.0000 mg | MEDICATED_PATCH | Freq: Every day | TRANSDERMAL | Status: DC

## 2015-12-11 MED ORDER — NITROFURANTOIN MONOHYD MACRO 100 MG PO CAPS: 100.0000 mg | ORAL_CAPSULE | Freq: Two times a day (BID) | ORAL | Status: DC

## 2015-12-11 MED ORDER — RISPERIDONE 0.5 MG PO TABS: 0.5000 mg | ORAL_TABLET | Freq: Two times a day (BID) | ORAL | Status: DC
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
## 2015-12-11 NOTE — BH Assessment (Signed)
Per Dr. Clapacs - patient meets criteria for inpatient hospitalization.  Per Charge Nurse Cliff no appropriate beds at [facility].  Per Lindsay  [facility] no appropriate beds.  TTS will seek placement.   The following facilities have open beds Brynn Marr, per FeeBee they have may have open beds tomorrow and I can fax the referral today.  [facility], per Janet [facility], per Lisa [facility], per Morgan [facility], per Cindy [facility], per Teresa [facility], per Jenny [facility], per Verna     The following facilities are at capacity [facility], per [facility], per Melanie [facility], per T[facility], per Janice [facility], per Christian [facility], per Ur[facility]ia [facility], per Kim [facility], per Kim [facility], per Cameon [facility], per Chelsie [facility], per [place]   The phone rang busy at  [facility] [phone]   Left a voice mail message  [facility] [phone]   [facility], per Chris - They only have [facility] beds.            

## 2015-12-11 NOTE — Progress Notes (Signed)
[facility]. Transported to behavioral med.

## 2015-12-11 NOTE — Discharge Summary (Signed)
[facility] [place] [facility]   PATIENT NAME: Sandy Mason    MR#:  [id number]  DATE OF BIRTH:  09-11-1975  DATE OF ADMISSION:  12/10/2015 ADMITTING PHYSICIAN: Adrian Saran, MD  DATE OF DISCHARGE: *12/11/2015  PRIMARY CARE PHYSICIAN: No primary care provider on file.    ADMISSION DIAGNOSIS:  Hypokalemia [E87.6] UTI (lower urinary tract infection) [N39.0] Intractable vomiting with nausea, unspecified vomiting type [R11.2]  DISCHARGE DIAGNOSIS:  Principal Problem:   PTSD (post-traumatic stress disorder) Active Problems:   Major depressive disorder recurrent severe without psychotic features.   Alcohol use disorder severe.   Borderline personality disorder   Nausea & vomiting   SECONDARY DIAGNOSIS:   Past Medical History  Diagnosis Date  . CVA (cerebral infarction)   . Seizures (HCC)   . Depression   . Anxiety   . Suicide attempt by hanging (HCC)   . Alcohol use disorder severe. 12/18/2014  . 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 Borderline personality disorder 12/18/2014  . Opioid use disorder, severe, dependence (HCC) 12/20/2014  . 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 PTSD (post-traumatic stress disorder) 03/28/2015    HOSPITAL COURSE:   40 year old female with a history PTSD and mood instability presented with nausea. For further details please further H&P.  1. Nausea and vomiting: This is due to withdrawal from her psychiatric medications. Nausea and vomiting is improved. She is tolerating her regular diet.  2. Hypokalemia: This was repleted prior to discharge.  3. Mood instability with suicidal ideation on initial admission: Psychiatry was consulted. As per psychiatry recommendations he did not require suicidal precautions and she was taken off the suicidal precautions. Remeron was discontinued which the patient has not been taking. Risperdal was  started in addition to her other outpatient psychiatric medications which this to help the visual hallucinations for her PTSD and mood instability. Patient will need outpatient follow-up with her psychiatrist.  4. UTI: Patient will continue Macrobid.  5. Leukocytosis: White blood cell count improved with treatment for UTI as well as IV fluids for nausea and vomiting. DISCHARGE CONDITIONS AND DIET:  Stable Condition to be discharged home on a regular diet  CONSULTS OBTAINED:  Treatment Team:  Audery Amel, MD  DRUG ALLERGIES:   Allergies  Allergen Reactions  . Depakote [Valproic Acid] Other (See Comments)    Reaction:  Seizures   . Haldol [Haloperidol Lactate] Other (See Comments)    Reaction:  Seizures   . Keflet [Cephalexin] Rash  . Nicoderm [Nicotine] Rash    DISCHARGE MEDICATIONS:   Current Discharge Medication List    START taking these medications   Details  nicotine (NICODERM CQ - DOSED IN MG/24 HOURS) 21 mg/24hr patch Place 1 patch (21 mg total) onto the skin daily. Qty: 28 patch, Refills: 0    nitrofurantoin, macrocrystal-monohydrate, (MACROBID) 100 MG capsule Take 1 capsule (100 mg total) by mouth every 12 (twelve) hours. Qty: 10 capsule, Refills: 0    risperiDONE (RISPERDAL) 0.5 MG tablet Take 1 tablet (0.5 mg total) by mouth 2 (two) times daily. Qty: 60 tablet, Refills: 0      CONTINUE these medications which have NOT CHANGED   Details  b complex vitamins capsule Take 1 capsule by mouth daily.    baclofen (LIORESAL) 10 MG tablet Take 1 tablet (10 mg total) by mouth 3 (three) times daily. Qty: 30 tablet, Refills: 0    gabapentin (NEURONTIN) 300 MG capsule Take  900 mg by mouth 3 (three) times daily.    prazosin (MINIPRESS) 1 MG capsule Take 2 capsules (2 mg total) by mouth at bedtime. Qty: 60 capsule, Refills: 0    venlafaxine XR (EFFEXOR-XR) 75 MG 24 hr capsule Take 225 mg by mouth daily with breakfast.    Vitamin D, Ergocalciferol, (DRISDOL) 50000  units CAPS capsule Take 50,000 Units by mouth once a week.      STOP taking these medications     lidocaine (LIDODERM) 5 %      mirtazapine (REMERON) 30 MG tablet      oxyCODONE-acetaminophen (ROXICET) 5-325 MG tablet      meloxicam (MOBIC) 15 MG tablet               Today   CHIEF COMPLAINT:   Still a little ranges denies suicidal ideations. Tolerated her diet.  VITAL SIGNS:  Blood pressure 96/54, pulse 92, temperature 98.1 F (36.7 C), temperature source Oral, resp. rate 18, height 5\' 8"  (1.727 m), weight 100.699 kg (222 lb), SpO2 98 %.   REVIEW OF SYSTEMS:  Review of Systems  Constitutional: Negative for fever, chills and malaise/fatigue.  HENT: Negative for ear discharge, ear pain, hearing loss, nosebleeds and sore throat.   Eyes: Negative for blurred vision and pain.  Respiratory: Negative for cough, hemoptysis, shortness of breath and wheezing.   Cardiovascular: Negative for chest pain, palpitations and leg swelling.  Gastrointestinal: Negative for nausea, vomiting, abdominal pain, diarrhea and blood in stool.  Genitourinary: Negative for dysuria.  Musculoskeletal: Negative for back pain.  Neurological: Negative for dizziness, tremors, speech change, focal weakness, seizures and headaches.  Endo/Heme/Allergies: Does not bruise/bleed easily.  Psychiatric/Behavioral: Positive for memory loss and substance abuse. Negative for depression, suicidal ideas and hallucinations. The patient is nervous/anxious. The patient does not have insomnia.      PHYSICAL EXAMINATION:  GENERAL:  40 y.o.-year-old patient lying in the bed with no acute distress.  NECK:  Supple, no jugular venous distention. No thyroid enlargement, no tenderness.  LUNGS: Normal breath sounds bilaterally, no wheezing, rales,rhonchi  No use of accessory muscles of respiration.  CARDIOVASCULAR: S1, S2 normal. No murmurs, rubs, or gallops.  ABDOMEN: Soft, non-tender, non-distended. Bowel sounds present.  No organomegaly or mass.  EXTREMITIES: No pedal edema, cyanosis, or clubbing.  PSYCHIATRIC: The patient is alert and oriented x 3.  SKIN: No obvious rash, lesion, or ulcer.   DATA REVIEW:   CBC  Recent Labs Lab 12/11/15 0418  WBC 7.7  HGB 11.6*  HCT 33.9*  PLT 283    Chemistries   Recent Labs Lab 12/10/15 0437 12/11/15 0418  NA 138 141  K 3.2* 3.1*  CL 106 114*  CO2 14* 20*  GLUCOSE 160* 90  BUN 10 8  CREATININE 0.90 0.72  CALCIUM 8.7* 7.9*  AST 29  --   ALT 30  --   ALKPHOS 75  --   BILITOT 0.6  --     Cardiac Enzymes  Recent Labs Lab 12/10/15 0437  TROPONINI <0.03    Microbiology Results  @[email or web address] @  RADIOLOGY:  Dg Abd Acute W/chest  12/10/2015  CLINICAL DATA:  Vomiting for 1 day. EXAM: DG ABDOMEN ACUTE W/ 1V CHEST COMPARISON:  02/03/2013 FINDINGS: There is no evidence of dilated bowel loops or free intraperitoneal air. No radiopaque calculi or other significant radiographic abnormality is seen. Heart size and mediastinal contours are within normal limits. Both lungs are clear. IUD incidentally noted. IMPRESSION: Negative abdominal radiographs.  No acute  cardiopulmonary disease. Electronically Signed   By: Ellery Plunk M.D.   On: 12/10/2015 06:14      Management plans discussed with the patient and she is in agreement. Stable for discharge home  Patient should follow up with her psychiatrist in 2 days  CODE STATUS:     Code Status Orders        Start     Ordered   12/10/15 1026  Full code   Continuous     12/10/15 1025    Code Status History    Date Active Date Inactive Code Status Order ID Comments User Context   10/31/2015  9:49 PM 11/01/2015  7:15 PM Full Code [id number]  Robley Fries, MD ED   03/27/2015  1:59 AM 03/29/2015  1:20 PM Full Code [id number]  Beau Fanny, MD Inpatient   12/19/2014  6:28 PM 12/23/2014  4:30 PM Full Code [id number]  Audery Amel, MD Inpatient   12/17/2014  9:16 PM 12/19/2014  6:28 PM Full Code [id number]   Katha Hamming, MD Inpatient      TOTAL TIME TAKING CARE OF THIS PATIENT: 36 minutes.    Note: This dictation was prepared with Dragon dictation along with smaller phrase technology. Any transcriptional errors that result from this process are unintentional.  Mialee Weyman M.D on 12/11/2015 at 9:16 AM  Between 7am to 6pm - Pager - [phone] After 6pm go to [email or web address] - Social research officer, government  [facility]  Office  [phone]  CC: Primary care physician; No primary care provider on file.

## 2015-12-12 ENCOUNTER — Inpatient Hospital Stay
Admission: RE | Admit: 2015-12-12 | Discharge: 2015-12-16 | DRG: 885 | Disposition: A | Discharge status: Home / Self Care | Payer: No Typology Code available for payment source | Source: Intra-hospital | Attending: Psychiatry | Admitting: Psychiatry

## 2015-12-12 DIAGNOSIS — T40605A Adverse effect of unspecified narcotics, initial encounter: Secondary | ICD-10-CM | POA: Diagnosis present

## 2015-12-12 DIAGNOSIS — K5903 Drug induced constipation: Secondary | ICD-10-CM | POA: Diagnosis present

## 2015-12-12 DIAGNOSIS — F1590 Other stimulant use, unspecified, uncomplicated: Secondary | ICD-10-CM | POA: Diagnosis present

## 2015-12-12 DIAGNOSIS — F1320 Sedative, hypnotic or anxiolytic dependence, uncomplicated: Secondary | ICD-10-CM | POA: Diagnosis present

## 2015-12-12 DIAGNOSIS — F17200 Nicotine dependence, unspecified, uncomplicated: Secondary | ICD-10-CM | POA: Diagnosis present

## 2015-12-12 DIAGNOSIS — S329XXA Fracture of unspecified parts of lumbosacral spine and pelvis, initial encounter for closed fracture: Secondary | ICD-10-CM | POA: Diagnosis present

## 2015-12-12 DIAGNOSIS — F17210 Nicotine dependence, cigarettes, uncomplicated: Secondary | ICD-10-CM | POA: Diagnosis present

## 2015-12-12 DIAGNOSIS — Z915 Personal history of self-harm: Secondary | ICD-10-CM | POA: Diagnosis not present

## 2015-12-12 DIAGNOSIS — G4700 Insomnia, unspecified: Secondary | ICD-10-CM | POA: Diagnosis present

## 2015-12-12 DIAGNOSIS — Z91410 Personal history of adult physical and sexual abuse: Secondary | ICD-10-CM

## 2015-12-12 DIAGNOSIS — Z8673 Personal history of transient ischemic attack (TIA), and cerebral infarction without residual deficits: Secondary | ICD-10-CM | POA: Diagnosis not present

## 2015-12-12 DIAGNOSIS — E876 Hypokalemia: Secondary | ICD-10-CM | POA: Diagnosis present

## 2015-12-12 DIAGNOSIS — Z833 Family history of diabetes mellitus: Secondary | ICD-10-CM

## 2015-12-12 DIAGNOSIS — R45851 Suicidal ideations: Secondary | ICD-10-CM | POA: Diagnosis present

## 2015-12-12 DIAGNOSIS — F331 Major depressive disorder, recurrent, moderate: Secondary | ICD-10-CM | POA: Diagnosis not present

## 2015-12-12 DIAGNOSIS — E559 Vitamin D deficiency, unspecified: Secondary | ICD-10-CM | POA: Diagnosis present

## 2015-12-12 DIAGNOSIS — F332 Major depressive disorder, recurrent severe without psychotic features: Secondary | ICD-10-CM | POA: Diagnosis present

## 2015-12-12 DIAGNOSIS — F1020 Alcohol dependence, uncomplicated: Secondary | ICD-10-CM | POA: Diagnosis present

## 2015-12-12 DIAGNOSIS — Z888 Allergy status to other drugs, medicaments and biological substances status: Secondary | ICD-10-CM

## 2015-12-12 DIAGNOSIS — F4310 Post-traumatic stress disorder, unspecified: Secondary | ICD-10-CM | POA: Diagnosis present

## 2015-12-12 DIAGNOSIS — Z765 Malingerer [conscious simulation]: Secondary | ICD-10-CM

## 2015-12-12 DIAGNOSIS — N390 Urinary tract infection, site not specified: Secondary | ICD-10-CM | POA: Diagnosis present

## 2015-12-12 DIAGNOSIS — Z720 Tobacco use: Secondary | ICD-10-CM | POA: Diagnosis present

## 2015-12-12 DIAGNOSIS — F1120 Opioid dependence, uncomplicated: Secondary | ICD-10-CM | POA: Diagnosis present

## 2015-12-12 DIAGNOSIS — F603 Borderline personality disorder: Secondary | ICD-10-CM | POA: Diagnosis present

## 2015-12-12 LAB — LIPID PANEL
CHOL/HDL RATIO: 4.6 ratio
CHOLESTEROL: 158 mg/dL (ref 0–200)
HDL: 34 mg/dL — AB (ref >40)
LDL Cholesterol: 89 mg/dL (ref 0–99)
TRIGLYCERIDES: 175 mg/dL — AB (ref <150)
VLDL: 35 mg/dL (ref 0–40)

## 2015-12-12 LAB — TSH: TSH: 2.209 u[IU]/mL (ref 0.350–4.500)

## 2015-12-12 MED ORDER — NITROFURANTOIN MONOHYD MACRO 100 MG PO CAPS
100.0000 mg | ORAL_CAPSULE | Freq: Two times a day (BID) | ORAL | Status: DC
  Administered 2015-12-12 – 2015-12-16 (×9): 100 mg via ORAL
  Filled 2015-12-12 (×9): qty 1

## 2015-12-12 MED ORDER — NICOTINE 21 MG/24HR TD PT24
21.0000 mg | MEDICATED_PATCH | Freq: Every day | TRANSDERMAL | Status: DC
  Administered 2015-12-12 – 2015-12-16 (×5): 21 mg via TRANSDERMAL
  Filled 2015-12-12 (×5): qty 1

## 2015-12-12 MED ORDER — VENLAFAXINE HCL ER 75 MG PO CP24
225.0000 mg | ORAL_CAPSULE | Freq: Every day | ORAL | Status: DC
  Administered 2015-12-12 – 2015-12-15 (×4): 225 mg via ORAL
  Filled 2015-12-12 (×4): qty 3

## 2015-12-12 MED ORDER — ACETAMINOPHEN 500 MG PO TABS
1000.0000 mg | ORAL_TABLET | Freq: Four times a day (QID) | ORAL | Status: DC | PRN
  Administered 2015-12-13 – 2015-12-16 (×8): 1000 mg via ORAL
  Filled 2015-12-12 (×9): qty 2

## 2015-12-12 MED ORDER — VITAMIN D (ERGOCALCIFEROL) 1.25 MG (50000 UNIT) PO CAPS
50000.0000 [IU] | ORAL_CAPSULE | ORAL | Status: DC
  Administered 2015-12-12: 50000 [IU] via ORAL
  Filled 2015-12-12: qty 1

## 2015-12-12 MED ORDER — IBUPROFEN 800 MG PO TABS
800.0000 mg | ORAL_TABLET | Freq: Four times a day (QID) | ORAL | Status: DC | PRN
Start: 2015-12-12 — End: 2015-12-12
  Administered 2015-12-12: 800 mg via ORAL
  Filled 2015-12-12: qty 1

## 2015-12-12 MED ORDER — LIDOCAINE 5 % EX PTCH
2.0000 | MEDICATED_PATCH | CUTANEOUS | Status: DC
  Administered 2015-12-13 – 2015-12-16 (×4): 2 via TRANSDERMAL
  Filled 2015-12-12 (×6): qty 2

## 2015-12-12 MED ORDER — MELOXICAM 7.5 MG PO TABS
7.5000 mg | ORAL_TABLET | Freq: Two times a day (BID) | ORAL | Status: DC
  Administered 2015-12-12 – 2015-12-16 (×9): 7.5 mg via ORAL
  Filled 2015-12-12 (×10): qty 1

## 2015-12-12 MED ORDER — CHLORDIAZEPOXIDE HCL 25 MG PO CAPS
25.0000 mg | ORAL_CAPSULE | Freq: Three times a day (TID) | ORAL | Status: DC
  Administered 2015-12-12 – 2015-12-13 (×3): 25 mg via ORAL
  Filled 2015-12-12 (×4): qty 1

## 2015-12-12 MED ORDER — HYDROCODONE-ACETAMINOPHEN 5-325 MG PO TABS
1.0000 | ORAL_TABLET | ORAL | Status: DC | PRN
  Administered 2015-12-12: 2 via ORAL
  Administered 2015-12-12: 1 via ORAL
  Filled 2015-12-12: qty 1
  Filled 2015-12-12: qty 2

## 2015-12-12 MED ORDER — CYCLOBENZAPRINE HCL 10 MG PO TABS
10.0000 mg | ORAL_TABLET | Freq: Three times a day (TID) | ORAL | Status: DC | PRN
  Administered 2015-12-12 – 2015-12-15 (×6): 10 mg via ORAL
  Filled 2015-12-12 (×6): qty 1

## 2015-12-12 MED ORDER — SENNOSIDES-DOCUSATE SODIUM 8.6-50 MG PO TABS: 1.0000 | ORAL_TABLET | Freq: Every evening | ORAL | Status: DC | PRN

## 2015-12-12 MED ORDER — GABAPENTIN 300 MG PO CAPS
900.0000 mg | ORAL_CAPSULE | Freq: Three times a day (TID) | ORAL | Status: DC
  Administered 2015-12-12 – 2015-12-16 (×15): 900 mg via ORAL
  Filled 2015-12-12 (×16): qty 3

## 2015-12-12 MED ORDER — ACETAMINOPHEN 325 MG PO TABS: 650.0000 mg | ORAL_TABLET | Freq: Four times a day (QID) | ORAL | Status: DC | PRN

## 2015-12-12 MED ORDER — HYDROCODONE-ACETAMINOPHEN 5-325 MG PO TABS
1.0000 | ORAL_TABLET | Freq: Four times a day (QID) | ORAL | Status: DC | PRN
  Administered 2015-12-12 – 2015-12-13 (×3): 1 via ORAL
  Filled 2015-12-12 (×3): qty 1

## 2015-12-12 MED ORDER — PRAZOSIN HCL 2 MG PO CAPS
2.0000 mg | ORAL_CAPSULE | Freq: Every day | ORAL | Status: DC
  Administered 2015-12-12 – 2015-12-15 (×4): 2 mg via ORAL
  Filled 2015-12-12 (×4): qty 1

## 2015-12-12 MED ORDER — RISPERIDONE 0.5 MG PO TABS
1.0000 mg | ORAL_TABLET | Freq: Two times a day (BID) | ORAL | Status: DC
  Administered 2015-12-12: 1 mg via ORAL
  Filled 2015-12-12: qty 2

## 2015-12-12 MED ORDER — ALUM & MAG HYDROXIDE-SIMETH 200-200-20 MG/5ML PO SUSP: 30.0000 mL | ORAL | Status: DC | PRN

## 2015-12-12 MED ORDER — HYDROCODONE-ACETAMINOPHEN 5-325 MG PO TABS: 1.0000 | ORAL_TABLET | Freq: Four times a day (QID) | ORAL | Status: DC | PRN

## 2015-12-12 MED ORDER — BACLOFEN 10 MG PO TABS
10.0000 mg | ORAL_TABLET | Freq: Three times a day (TID) | ORAL | Status: DC
  Administered 2015-12-12: 10 mg via ORAL
  Filled 2015-12-12 (×3): qty 1

## 2015-12-12 MED ORDER — MIRTAZAPINE 15 MG PO TABS
15.0000 mg | ORAL_TABLET | Freq: Every day | ORAL | Status: DC
  Administered 2015-12-12 – 2015-12-14 (×3): 15 mg via ORAL
  Filled 2015-12-12 (×3): qty 1

## 2015-12-12 MED ORDER — MAGNESIUM HYDROXIDE 400 MG/5ML PO SUSP: 30.0000 mL | Freq: Every day | ORAL | Status: DC | PRN

## 2015-12-12 MED ORDER — ACETAMINOPHEN 325 MG PO TABS
650.0000 mg | ORAL_TABLET | Freq: Four times a day (QID) | ORAL | Status: DC | PRN
  Administered 2015-12-12: 650 mg via ORAL
  Filled 2015-12-12: qty 2

## 2015-12-12 MED ORDER — DOCUSATE SODIUM 100 MG PO CAPS
200.0000 mg | ORAL_CAPSULE | Freq: Two times a day (BID) | ORAL | Status: DC
  Administered 2015-12-12 – 2015-12-16 (×9): 200 mg via ORAL
  Filled 2015-12-12 (×10): qty 2

## 2015-12-12 MED ORDER — ACETAMINOPHEN 650 MG RE SUPP
650.0000 mg | Freq: Four times a day (QID) | RECTAL | Status: DC | PRN
Start: 2015-12-12 — End: 2015-12-12
  Filled 2015-12-12: qty 1

## 2015-12-12 NOTE — Plan of Care (Signed)
Problem: Alteration in mood; excessive anxiety as evidenced by: Goal: STG-Pt will report an absence of self-harm thoughts/actions (Patient will report an absence of self-harm thoughts or actions)  Outcome: Progressing Denies SI, HI

## 2015-12-12 NOTE — Plan of Care (Signed)
Problem: Consults Goal: Psychosis Patient Education See Patient Education Module for education specifics.  Outcome: Not Progressing Patient states she still has auditory hallucinations occasionally for which shee  Attributes to withdrawal from baclofen [place]

## 2015-12-12 NOTE — [facility] Group Notes (Signed)
[facility] Group Notes:  (Nursing/MHT/Case Management/Adjunct)  Date:  12/12/2015  Time:  4:08 PM  Type of Therapy:  Psychoeducational Skills  Participation Level:  Minimal  Participation Quality:  Appropriate, Attentive and Came in late  Affect:  Flat  Cognitive:  Appropriate  Insight:  Appropriate  Engagement in Group:  Supportive  Modes of Intervention:  Discussion and Education  Summary of Progress/Problems:  Sandy Mason 12/12/2015, 4:08 PM

## 2015-12-12 NOTE — Progress Notes (Signed)
D: Patient is alert and oriented on the unit this shift. Patient attended and actively participated in groups today. Patient denies suicidal ideation, homicidal ideation, states she has auditory   Hallucinations occasionally A: Scheduled medications are administered to patient as per MD orders. Emotional support and encouragement are provided. Patient is maintained on q.15 minute safety checks. Patient is informed to notify staff with questions or concerns. R: No adverse medication reactions are noted. Patient is cooperative with medication administration and treatment plan today. Patient is receptive, calm and cooperative on the unit at this time. Patient has minimal  interaction  with others on the unit this shift. Patient contracts for safety at this time. Patient remains safe at this time.

## 2015-12-12 NOTE — [facility] Suicide Risk Assessment (Signed)
[facility]   Nursing information obtained from:    Demographic factors:    Current Mental Status:    Loss Factors:    Historical Factors:    Risk Reduction Factors:     Total Time spent with patient: 1 hour Principal Problem: Major depressive disorder, recurrent episode, moderate (HCC) Diagnosis:   Patient Active Problem List   Diagnosis Date Noted  . Major depressive disorder, recurrent episode, moderate (HCC) [F33.1] 12/12/2015  . Closed pelvic fracture (HCC) [S32.9XXA] 10/31/2015  . Tobacco abuse [Z72.0] 10/31/2015  . Tobacco use disorder [F17.200] 03/28/2015  . PTSD (post-traumatic stress disorder) [F43.10] 03/28/2015  . Stimulant use disorder (cocaine) [F15.90] 03/28/2015  . Sedative, hypnotic or anxiolytic use disorder, severe, dependence (HCC) [F13.20] 03/27/2015  . Opioid use disorder, severe, dependence (HCC) [F11.20] 12/20/2014  . Alcohol use disorder severe. [F10.20] 12/18/2014  . Borderline personality disorder [F60.3] 12/18/2014   Subjective Data:   Continued Clinical Symptoms:  Alcohol Use Disorder Identification Test Final Score (AUDIT): 3 The "Alcohol Use Disorders Identification Test", Guidelines for Use in Primary Care, Second Edition.  World Science writerHealth Organization [facility](WHO). Score between 0-7:  no or low risk or alcohol related problems. Score between 8-15:  moderate risk of alcohol related problems. Score between 16-19:  high risk of alcohol related problems. Score 20 or above:  warrants further diagnostic evaluation for alcohol dependence and treatment.   CLINICAL FACTORS:   Alcohol/Substance Abuse/Dependencies Previous Psychiatric Diagnoses and Treatments   Psychiatric Specialty Exam: ROS  Blood pressure 126/84, pulse 80, temperature 98.3 F (36.8 C), temperature source Oral, resp. rate 20, height 5\' 8"  (1.727 m), weight 100.245 kg (221 lb), SpO2 100 %.Body mass index is 33.61 kg/(m^2).   COGNITIVE FEATURES THAT CONTRIBUTE TO  RISK:  None    SUICIDE RISK:   Mild:  Suicidal ideation of limited frequency, intensity, duration, and specificity.  There are no identifiable plans, no associated intent, mild dysphoria and related symptoms, good self-control (both objective and subjective assessment), few other risk factors, and identifiable protective factors, including available and accessible social support.  [facility]  I certify that inpatient services furnished can reasonably be expected to improve the patient's condition.   Jimmy FootmanHernandez-Gonzalez,  Skyanne Welle, MD 12/12/2015, 2:38 PM

## 2015-12-12 NOTE — Progress Notes (Signed)
Recreation Therapy Notes  Date: 05.15.17 Time: 1:00 pm Location: Craft Room  Group Topic: Wellness  Goal Area(s) Addresses:  Patient will identify at least one item per dimension of health. Patient will examine areas they are deficient in.  Behavioral Response: Did not attend  Intervention: 6 Dimensions of Health  Activity: Patients were given a definition sheet of the 6 dimensions of health. Patients were given a worksheet with the dimensions of health and instructed to write 2-3 things per each category.  Education: LRT educated patients on how they can improve each dimension of health.  Education Outcome: Patient did not attend group.   Clinical Observations/Feedback: Patient did not attend group.  Jacquelynn CreeGreene,Reena Borromeo M, LRT/CTRS 12/12/2015 3:37 PM
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
## 2015-12-12 NOTE — Tx Team (Signed)
Initial Interdisciplinary Treatment Plan   PATIENT STRESSORS: Medication change or noncompliance   PATIENT STRENGTHS: Average or above average intelligence Capable of independent living Communication skills   PROBLEM LIST: Problem List/Patient Goals Date to be addressed Date deferred Reason deferred Estimated date of resolution  Anxiety 12-11-15     [facility]     Medication management  12-11-15           "feeling out of it"      "seeing things"      "they put me back on baclofen"                   DISCHARGE CRITERIA:  Improved stabilization in mood, thinking, and/or behavior  PRELIMINARY DISCHARGE PLAN: Outpatient therapy  PATIENT/FAMIILY INVOLVEMENT: This treatment plan has been presented to and reviewed with the patient, Sandy Mason, and/or family member, .  The patient and family have been given the opportunity to ask questions and make suggestions.  Ernesto RutherfordKristen Jules Baty 12/12/2015, 5:31 AM

## 2015-12-12 NOTE — Progress Notes (Signed)
Pt with somatic complaints of pain. Requesting pain medicine. Medicine given with partial relief. Pt pleasant and cooperative with care. Denies SI, HI, AVH.

## 2015-12-13 LAB — PROLACTIN: Prolactin: 98.1 ng/mL — ABNORMAL HIGH (ref 4.8–23.3)

## 2015-12-13 LAB — HEMOGLOBIN A1C: HEMOGLOBIN A1C: 5.4 % (ref 4.0–6.0)

## 2015-12-13 MED ORDER — CHLORDIAZEPOXIDE HCL 10 MG PO CAPS
10.0000 mg | ORAL_CAPSULE | Freq: Three times a day (TID) | ORAL | Status: DC
  Administered 2015-12-13 (×3): 10 mg via ORAL
  Filled 2015-12-13 (×3): qty 1

## 2015-12-13 MED ORDER — HYDROCODONE-ACETAMINOPHEN 5-325 MG PO TABS
1.0000 | ORAL_TABLET | Freq: Three times a day (TID) | ORAL | Status: DC | PRN
  Administered 2015-12-13 – 2015-12-14 (×2): 1 via ORAL
  Filled 2015-12-13 (×2): qty 1

## 2015-12-13 NOTE — Progress Notes (Signed)
Recreation Therapy Notes  Date: 05.16.17 Time: 3:00 pm Location: Craft Room  Group Topic: Self-expression  Goal Area(s) Addresses:  Patient will be able to identify a color that represents each emotion. Patient will verbalize benefit of using art as a means of self-expression. Patient will verbalize one emotion experienced while participating in activity.  Behavioral Response: Attentive, Interactive  Intervention: The Colors Within Me  Activity: Patients were given a blank face worksheet and instructed to pick a color for each emotion they were experiencing and show on the face how much of that emotion they were experiencing.  Education: LRT educated patients on other forms of self-expression.  Education Outcome: Acknowledges education/In group clarification offered  Clinical Observations/Feedback: Patient completed activity by picking a color for each emotion she was feeling and showing how much of that emotion she was feeling on the worksheet. Patient contributed to group discussion by stating what emotions she was feeling, and what steps she is taking to change her emotions.  Jacquelynn CreeGreene,Renaldo Gornick M, LRT/CTRS 12/13/2015 4:58 PM

## 2015-12-13 NOTE — Progress Notes (Signed)
Recreation Therapy Notes  At approximately 1:10 pm, LRT attempted assessment. Patient sleeping and did not wake up when name was called.  Jacquelynn CreeGreene,Lamond Glantz M, LRT/CTRS 12/13/2015 1:31 PM

## 2015-12-13 NOTE — [facility] Group Notes (Signed)
[facility] Group Notes:  (Nursing/MHT/Case Management/Adjunct)  Date:  12/13/2015  Time:  2:26 PM  Type of Therapy:  Psychoeducational Skills  Participation Level:  Did Not Attend    Sandy Mason M Moses Ellison 12/13/2015, 2:26 PM 

## 2015-12-13 NOTE — [facility] Group Notes (Signed)
[facility] LCSW Aftercare Discharge Planning Group Note   12/13/2015 11:01 AM  Participation Quality:   Patient was called to group but did not attend.   Olie Dibert T, MSW, LCSW  
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
## 2015-12-13 NOTE — Progress Notes (Signed)
Patient is quiet and reserved.  Not forthcoming with information.  Exhibits medication seeking behaviors.  Stays to herself.  Support and encouragement offered.  Safety maintained.

## 2015-12-13 NOTE — [facility] Group Notes (Signed)
[facility] LCSW Group Therapy  12/13/2015 8:48 AM  Type of Therapy:  Group Therapy  Participation Level:  Active  Participation Quality:  Appropriate and Attentive  Affect:  Appropriate  Cognitive:  Alert, Appropriate and Oriented  Insight:  Engaged  Engagement in Therapy:  Engaged  Modes of Intervention:  Discussion, Socialization and Support  Summary of Progress/Problems: Patient attended and participated appropriately during group introducing herself and sharing during an introductory exercise that if she were an animal, she would be "a bird so I could fly and maybe an [place] because they are strong". Patient shared that [place] has moved around [place] recently moved back to the area and needs to get reconnected with a support system.   Lulu RidingIngle, Annalysse Shoemaker T, MSW, LCSW 12/13/2015, 8:48 AM

## 2015-12-13 NOTE — Progress Notes (Signed)
[facility] MD Progress Note  12/13/2015 12:44 PM Sandy Mason  MRN:  [id number] Subjective:  Per nursing is that the patient has been attending programming. Not unsafe or destructive behavior displaying the unit overnight. Today I confronted the patient about then conflictual information in her chart.  Appears that the patient had the expectation that she was going to be discharged with a prescription for opiates from [place].  I discussed with her that I was planning on discharging her on the Lidoderm patches, Mobic and Flexeril. I also discussed to her that I was not recommending for her to continue the treatment for opiates. I pointed out to her that she has a past history of addiction to opiates and these fracture opens the door for her to have a relapse.  In addition I pointed out to her that she was positive for cocaine upon arrival to the emergency room.  Patient denies trying to obtain opiates in order to "get high". Patient says she was to do the right thing to maintain her sobriety. She does not acknowledge having deceiving behavior about her visits to emergency departments and minimizes the use of cocaine and alcohol.  When asked if there were any other issues that she would like to address during her stay in behavioral health she is stated that she was having a lot of trouble with her concentration and that she felt that a lot of times she was not able to concentrate and accomplish or complete task.  Patient was advised to follow up with this concerns to her outpatient psychiatrist. However I feel that the patient was attempting to get prescribed with medication for ADHD.  I will discourage any attempts to add more medications to her regimen especially controlled substances. This patient is in need of addressing her substance abuse issues. She continues to minimizes this  issue and does not seem to have an insight.  Patient states she still does not feel well and does not feel she is ready to go home.  She says she still feels very confused at times and foggy. Per nursing reports she still reported having hallucinations.   Principal Problem: Major depressive disorder, recurrent episode, moderate (HCC) Diagnosis:   Patient Active Problem List   Diagnosis Date Noted  . Major depressive disorder, recurrent episode, moderate (HCC) [F33.1] 12/12/2015  . Closed pelvic fracture (HCC) [S32.9XXA] 10/31/2015  . Tobacco abuse [Z72.0] 10/31/2015  . Tobacco use disorder [F17.200] 03/28/2015  . PTSD (post-traumatic stress disorder) [F43.10] 03/28/2015  . Stimulant use disorder (cocaine) [F15.90] 03/28/2015  . Sedative, hypnotic or anxiolytic use disorder, severe, dependence (HCC) [F13.20] 03/27/2015  . Opioid use disorder, severe, dependence (HCC) [F11.20] 12/20/2014  . Alcohol use disorder severe. [F10.20] 12/18/2014  . Borderline personality disorder [F60.3] 12/18/2014   Total Time spent with patient: 30 minutes  Past Psychiatric History:   Past Medical History:  Past Medical History  Diagnosis Date  . CVA (cerebral infarction)   . Seizures (HCC)   . Depression   . Anxiety   . Suicide attempt by hanging (HCC)   . Alcohol use disorder severe. 12/18/2014  . 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 Borderline personality disorder 12/18/2014  . Opioid use disorder, severe, dependence (HCC) 12/20/2014  . 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 PTSD (post-traumatic stress disorder) 03/28/2015   History reviewed. No pertinent past surgical history. Family History:  Family History  Problem Relation Age of Onset  .  Diabetes Father    Family Psychiatric  History:   Social History:  History  Alcohol Use  . 3.6 oz/week  . 6 Cans of beer per week    Comment: last used 6 months ago     History  Drug Use  . Yes  . Special: Benzodiazepines, Hydrocodone    Social History   Social History  . Marital  Status: Single    Spouse Name: N/A  . Number of Children: N/A  . Years of Education: N/A   Social History Main Topics  . Smoking status: Current Every Day Smoker -- 1.00 packs/day    Types: Cigarettes  . Smokeless tobacco: None  . Alcohol Use: 3.6 oz/week    6 Cans of beer per week     Comment: last used 6 months ago  . Drug Use: Yes    Special: Benzodiazepines, Hydrocodone  . Sexual Activity: Yes    Birth Control/ Protection: IUD   Other Topics Concern  . None   Social History Narrative     Current Medications: Current Facility-Administered Medications  Medication Dose Route Frequency Provider Last Rate Last Dose  . acetaminophen (TYLENOL) tablet 1,000 mg  1,000 mg Oral Q6H PRN Jimmy FootmanAndrea Hernandez-Gonzalez, MD      . alum & mag hydroxide-simeth (MAALOX/MYLANTA) 200-200-20 MG/5ML suspension 30 mL  30 mL Oral Q4H PRN Audery AmelJohn T Clapacs, MD      . chlordiazePOXIDE (LIBRIUM) capsule 10 mg  10 mg Oral TID WC & HS Jimmy FootmanAndrea Hernandez-Gonzalez, MD   10 mg at 12/13/15 1150  . cyclobenzaprine (FLEXERIL) tablet 10 mg  10 mg Oral TID PRN Jimmy FootmanAndrea Hernandez-Gonzalez, MD   10 mg at 12/13/15 1150  . docusate sodium (COLACE) capsule 200 mg  200 mg Oral BID Jimmy FootmanAndrea Hernandez-Gonzalez, MD   200 mg at 12/13/15 0935  . gabapentin (NEURONTIN) capsule 900 mg  900 mg Oral TID Audery AmelJohn T Clapacs, MD   900 mg at 12/13/15 0934  . HYDROcodone-acetaminophen (NORCO/VICODIN) 5-325 MG per tablet 1 tablet  1 tablet Oral Q8H PRN Jimmy FootmanAndrea Hernandez-Gonzalez, MD      . lidocaine (LIDODERM) 5 % 2 patch  2 patch Transdermal Q24H Jimmy FootmanAndrea Hernandez-Gonzalez, MD   2 patch at 12/13/15 1000  . magnesium hydroxide (MILK OF MAGNESIA) suspension 30 mL  30 mL Oral Daily PRN Audery AmelJohn T Clapacs, MD      . meloxicam (MOBIC) tablet 7.5 mg  7.5 mg Oral BID WC Jimmy FootmanAndrea Hernandez-Gonzalez, MD   7.5 mg at 12/13/15 [id number]  . mirtazapine (REMERON) tablet 15 mg  15 mg Oral QHS Jimmy FootmanAndrea Hernandez-Gonzalez, MD   15 mg at 12/12/15 2200  . nicotine (NICODERM CQ -  dosed in mg/24 hours) patch 21 mg  21 mg Transdermal Daily Audery AmelJohn T Clapacs, MD   21 mg at 12/13/15 1031  . nitrofurantoin (macrocrystal-monohydrate) (MACROBID) capsule 100 mg  100 mg Oral Q12H Audery AmelJohn T Clapacs, MD   100 mg at 12/13/15 0935  . prazosin (MINIPRESS) capsule 2 mg  2 mg Oral QHS Audery AmelJohn T Clapacs, MD   2 mg at 12/12/15 2200  . senna-docusate (Senokot-S) tablet 1 tablet  1 tablet Oral QHS PRN Audery AmelJohn T Clapacs, MD      . venlafaxine XR (EFFEXOR-XR) 24 hr capsule 225 mg  225 mg Oral Q breakfast Audery AmelJohn T Clapacs, MD   225 mg at 12/13/15 0935  . Vitamin D (Ergocalciferol) (DRISDOL) capsule 50,000 Units  50,000 Units Oral Q7 days Jimmy FootmanAndrea Hernandez-Gonzalez, MD   50,000 Units at 12/12/15 2200  Lab Results:  Results for orders placed or performed during the hospital encounter of 12/12/15 (from the past 48 hour(s))  Hemoglobin A1c     Status: None   Collection Time: 12/12/15  6:59 AM  Result Value Ref Range   Hgb A1c MFr Bld 5.4 4.0 - 6.0 %  Lipid panel, fasting     Status: Abnormal   Collection Time: 12/12/15  6:59 AM  Result Value Ref Range   Cholesterol 158 0 - 200 mg/dL   Triglycerides 956 (H) <150 mg/dL   HDL 34 (L) >21 mg/dL   Total CHOL/HDL Ratio 4.6 RATIO   VLDL 35 0 - 40 mg/dL   LDL Cholesterol 89 0 - 99 mg/dL    Comment:        Total Cholesterol/HDL:CHD Risk Coronary Heart Disease Risk Table                     Men   Women  1/2 Average Risk   3.4   3.3  Average Risk       5.0   4.4  2 X Average Risk   9.6   7.1  3 X Average Risk  23.4   11.0        Use the calculated Patient Ratio above and the CHD Risk Table to determine the patient's CHD Risk.        ATP III CLASSIFICATION (LDL):  <100     mg/dL   Optimal  308-657  mg/dL   Near or Above                    Optimal  130-159  mg/dL   Borderline  846-962  mg/dL   High  >952     mg/dL   Very High   Prolactin     Status: Abnormal   Collection Time: 12/12/15  6:59 AM  Result Value Ref Range   Prolactin 98.1 (H) 4.8 -  23.3 ng/mL    Comment: (NOTE) Performed At: [facility] [address] [place], [place] [id number] Mila Homer MD UU:[id number]   TSH     Status: None   Collection Time: 12/12/15  6:59 AM  Result Value Ref Range   TSH 2.209 0.350 - 4.500 uIU/mL    Blood Alcohol level:  Lab Results  Component Value Date   ETH <5 03/25/2015   ETH <5 12/17/2014    Physical Findings: AIMS: Facial and Oral Movements Muscles of Facial Expression: None, normal Lips and Perioral Area: None, normal Jaw: None, normal Tongue: None, normal,Extremity Movements Upper (arms, wrists, hands, fingers): None, normal Lower (legs, knees, ankles, toes): None, normal, Trunk Movements Neck, shoulders, hips: None, normal, Overall Severity Severity of abnormal movements (highest score from questions above): None, normal Incapacitation due to abnormal movements: None, normal Patient's awareness of abnormal movements (rate only patient's report): No Awareness, Dental Status Current problems with teeth and/or dentures?: No Does patient usually wear dentures?: No  CIWA:    COWS:     Musculoskeletal: Strength & Muscle Tone: within normal limits Gait & Station: normal Patient leans: N/A  Psychiatric Specialty Exam: Review of Systems  Constitutional: Negative.   HENT: Negative.   Eyes: Negative.   Respiratory: Negative.   Cardiovascular: Negative.   Gastrointestinal: Negative.   Genitourinary: Negative.   Musculoskeletal: Positive for joint pain.  Skin: Negative.   Neurological: Negative.   Endo/Heme/Allergies: Negative.   Psychiatric/Behavioral: Positive for depression.    Blood pressure 135/79, pulse  66, temperature 98.4 F (36.9 C), temperature source Oral, resp. rate 20, height 5\' 8"  (1.727 m), weight 100.245 kg (221 lb), SpO2 100 %.Body mass index is 33.61 kg/(m^2).  General Appearance: Fairly Groomed  Patent attorney::  Good  Speech:  Clear and Coherent  Volume:  Normal  Mood:  Dysphoric   Affect:  Congruent  Thought Process:  Linear  Orientation:  Full (Time, Place, and Person)  Thought Content:  Hallucinations: None  Suicidal Thoughts:  No  Homicidal Thoughts:  No  Memory:  Immediate;   Fair Recent;   Fair Remote;   Fair  Judgement:  Poor  Insight:  Shallow  Psychomotor Activity:  Normal  Concentration:  Good  Recall:  Good  Fund of Knowledge:Good  Language: Good  Akathisia:  No  Handed:    AIMS (if indicated):     Assets:  Communication Skills Housing  ADL's:  Intact  Cognition: WNL  Sleep:  Number of Hours: 7.15   Treatment Plan Summary: Patient was admitted to the medical floor due to untreatable nausea vomiting and a UTI. Patient tells me that all of this is started 5 days after she ran out of baclofen. Likely be his episode of delirium and nausea and vomiting was triggered by withdrawal from baclofen. Patient is also mixing prescription medications for pain with illicit substances (in addition to alcohol).  This patient is not a good candidate for the treatment with opiates. The patient will be discontinued as soon as possible by her outpatient provider. I will try to forward this information to him.  MDD: continue effexor XR 225 mg po q day. Do not plan to change Effexor  Insomnia: Continue remeron 15 mg po qhs.  Don't plan to change  PTSD related nightmares: Continue prazosin 2 mg by mouth daily at bedtime  Delirium and withdrawals from baclofen: We will discontinue baclofen and start the patient on a Librium taper. Today on 10 mg tid of librium  Substance abuse issues: Patient has a long history of polysubstance abuse. Patient has abused benzodiazepines, opiates, cocaine and alcohol in the past. She was just discharged from a recovery house in [place] where she stayed for about 6 months. Patient says she is still sober however her urine toxicology screening is positive for cocaine. She also told Dr. Toni Amend that she had used alcohol prior to  admission. Patient will be reconnected to [facility] for intensive outpatient substance abuse.  Tobacco use disorder: pt receiving nicotine patch 21 mg per day  Pain issues as a result of pelvic fracture 1 month ago:  -will decrease opioids to 1 tab of hydrocodone-acetaminophen every 6 hours as needed for severe pain--- will not discharge on opiates  -Continue Lidoderm patch  -Continue mobic 7.5 mg po bid -Continue flexeril 10 mg tid prn -Continue neurontin 900 mg tid -Use tylenol prn for mild pain -Patient requested a walker  -PT consulted  Opiate-induced constipation: Will order Colace 200 mg by mouth twice a day and Senokot daily at bedtime  UTI: Continue Macrobid will complete 7 days on May 20  Vitamin D deficiency the patient will be continued on vitamin D 50,000 units every week  Precautions every 15 minute checks  Diet regular  Plan to d/c in 24-48 h   Jimmy Footman, MD 12/13/2015, 12:44 PM

## 2015-12-13 NOTE — Evaluation (Signed)
Physical Therapy Evaluation Patient Details Name: Sandy LieuJennifer J Minerva MRN: [id number] DOB: 1975-09-20 Today's Date: 12/13/2015   History of Present Illness  40 yo F presented to ED 5/13 with c/o rectal bleeding, impaired hearing and bleeding of L ear and N/V. She previously came to the ED on 4/3 due to L leg pain after a fall on 3/31. She was found to have a closed pelvic fx. PMH includes CVA, depression, suicide attempt, PTSD, and substance abuse. Pt currently admitted with major depressive disorder.  Clinical Impression  Pt demonstrated pain with ambulation due to previous pelvic fracture. She is not allowed to have her crutches in [facility]. Pt reports reduced pain ambulating with FWW to reduce weight bearing on pelvis. PT educated pt to continue using crutches when she returns home to reduce pain with walking. Pt has no other concerns and is at her baseline level of function. Pt has no needs for skilled PT at this time and will need new orders should a need arise in the future.     Follow Up Recommendations No PT follow up    Equipment Recommendations  None recommended by PT (pt has recommended crutches)    Recommendations for Other Services       Precautions / Restrictions Restrictions Weight Bearing Restrictions: No      Mobility  Bed Mobility Overal bed mobility: Independent                Transfers Overall transfer level: Modified independent Equipment used: Rolling walker (2 wheeled)             General transfer comment: steady with no LOB  Ambulation/Gait Ambulation/Gait assistance: Modified independent (Device/Increase time) Ambulation Distance (Feet): 75 Feet Assistive device: Rolling walker (2 wheeled) Gait Pattern/deviations: Step-to pattern;Antalgic Gait velocity: reduced   General Gait Details: Pt ambulated 10 ftx2 without AD and 75 ft with FWW and mod I. Steady with no LOB.  Stairs            Wheelchair Mobility    Modified Rankin (Stroke  Patients Only)       Balance Overall balance assessment: Modified Independent                                           Pertinent Vitals/Pain Pain Assessment: 0-10 Pain Score: 8  Pain Location: pelvis Pain Descriptors / Indicators: Aching Pain Intervention(s): Limited activity within patient's tolerance;Monitored during session    Home Living Family/patient expects to be discharged to:: Private residence Living Arrangements: Non-relatives/Friends Available Help at Discharge: Friend(s) Type of Home: House Home Access: Stairs to enter Entrance Stairs-Rails: None Entrance Stairs-Number of Steps: 2 Home Layout: One level Home Equipment: Crutches Additional Comments: Pt lives with her roommate and mother.    Prior Function Level of Independence: Independent         Comments: I with ADLs, was about to begin a new job     Higher education careers adviserHand Dominance        Extremity/Trunk Assessment   Upper Extremity Assessment: Overall WFL for tasks assessed           Lower Extremity Assessment: Overall WFL for tasks assessed         Communication   Communication: No difficulties  Cognition Arousal/Alertness: Awake/alert Behavior During Therapy: WFL for tasks assessed/performed Overall Cognitive Status: Within Functional Limits for tasks assessed  General Comments      Exercises        Assessment/Plan    PT Assessment Patent does not need any further PT services  PT Diagnosis Difficulty walking   PT Problem List    PT Treatment Interventions     PT Goals (Current goals can be found in the Care Plan section) Acute Rehab PT Goals Patient Stated Goal: to get better PT Goal Formulation: With patient Time For Goal Achievement: 12/27/15 Potential to Achieve Goals: Good    Frequency     Barriers to discharge        Co-evaluation               End of Session Equipment Utilized During Treatment: Gait belt Activity  Tolerance: Patient tolerated treatment well Patient left: Other (comment) (up in room, about to go to group session) Nurse Communication: Mobility status         Time: 1610-9604 PT Time Calculation (min) (ACUTE ONLY): 14 min   Charges:   PT Evaluation $PT Eval Low Complexity: 1 Procedure     PT G Codes:        Adelene Idler, PT, DPT  12/13/2015, 3:28 PM [phone]

## 2015-12-14 MED ORDER — LIDOCAINE 5 % EX PTCH: 2.0000 | MEDICATED_PATCH | CUTANEOUS | Status: DC

## 2015-12-14 MED ORDER — PRAZOSIN HCL 2 MG PO CAPS
2.0000 mg | ORAL_CAPSULE | Freq: Every day | ORAL | Status: DC
Start: 2015-12-14 — End: 2016-02-16

## 2015-12-14 MED ORDER — MELOXICAM 7.5 MG PO TABS: 7.5000 mg | ORAL_TABLET | Freq: Two times a day (BID) | ORAL | Status: DC

## 2015-12-14 MED ORDER — VENLAFAXINE HCL ER 75 MG PO CP24: 225.0000 mg | ORAL_CAPSULE | Freq: Every day | ORAL | Status: DC

## 2015-12-14 MED ORDER — NITROFURANTOIN MONOHYD MACRO 100 MG PO CAPS: 100.0000 mg | ORAL_CAPSULE | Freq: Two times a day (BID) | ORAL | Status: DC

## 2015-12-14 MED ORDER — MIRTAZAPINE 15 MG PO TABS: 15.0000 mg | ORAL_TABLET | Freq: Every day | ORAL | Status: DC

## 2015-12-14 MED ORDER — CHLORDIAZEPOXIDE HCL 10 MG PO CAPS
10.0000 mg | ORAL_CAPSULE | Freq: Three times a day (TID) | ORAL | Status: DC
  Administered 2015-12-14: 10 mg via ORAL
  Filled 2015-12-14: qty 1

## 2015-12-14 MED ORDER — CYCLOBENZAPRINE HCL 10 MG PO TABS: 10.0000 mg | ORAL_TABLET | Freq: Three times a day (TID) | ORAL | Status: DC | PRN

## 2015-12-14 NOTE — Progress Notes (Signed)
Patient with sad affect, states she feels more depressed today now that Haldol incident has passed. Social with peers, verbalizes needs to staff. Irritable and not in agreement with recommended plan of care. Patient focus remains on pain meds for chronic pelvic pain. Reports 10/10 pelvic pain to writer while standing in med room, with no grimace or guarding. Patient ambulating, eating and social with peers with 10/10 pelvic pain. Meets with MD, Administratorsocial worker and writer to discuss plan of care. Irritable and angry when MD informs patient pain med would be discontinued and other prn meds would be available to manage discomfort. Safety maintained.

## 2015-12-14 NOTE — [facility] Group Notes (Signed)
[facility] LCSW Group Therapy  12/14/2015 2:51 PM  Type of Therapy:  Group Therapy  Participation Level:  Did Not Attend  Summary of Progress/Problems:  Patient was called to group but did not attend.   Ameerah Huffstetler T, MSW, LCSW 12/14/2015, 2:51 PM  

## 2015-12-14 NOTE — [facility] Group Notes (Signed)
[facility] Group Notes:  (Nursing/MHT/Case Management/Adjunct)  Date:  12/14/2015  Time:  4:23 PM  Type of Therapy:  Group Therapy  Participation Level:  Did Not Attend  Participation Quality Mayra NeerJackie L Enslee Bibbins 12/14/2015, 4:23 PM

## 2015-12-14 NOTE — Plan of Care (Signed)
Problem: Alteration in mood; excessive anxiety as evidenced by: Goal: LTG-Patient's behavior demonstrates decreased anxiety (Patient's behavior demonstrates anxiety and he/she is utilizing learned coping skills to deal with anxiety-producing situations)  Outcome: Progressing Patient appears less anxious.

## 2015-12-14 NOTE — [facility] Group Notes (Signed)
[facility] Group Notes:  (Nursing/MHT/Case Management/Adjunct)  Date:  12/14/2015  Time:  10:07 PM  Type of Therapy:  Group Therapy  Participation Level:  Active  Participation Quality:  Appropriate  Affect:  Appropriate  Cognitive:  Appropriate  Insight:  Appropriate  Engagement in Group:  Engaged  Modes of Intervention:  Discussion  Summary of Progress/Problems:  Burt EkJanice Marie Conny Moening 12/14/2015, 10:07 PM

## 2015-12-14 NOTE — Progress Notes (Signed)
Sandy Mason Progress Note  12/14/2015 11:56 AM Sandy LieuJennifer J Mason  MRN:  [id number] Subjective: "I just want to go to sleep and never wake up". Pt has been reporting passive SI since yesterday.  Still saying she is not safe to go home.  Pt continues to c/o severe pain.  She was seen by PT who feels there are no indication for further PT or weight bearing restrictions.  Pt was very upset today as I'm planning on d/c opioids.  Says no one cares about her pain issues.  Pt left room in the middle of the assessment.    On 5/16 Today I confronted the patient about then conflictual information in her chart.  Appears that the patient had the expectation that she was going to be discharged with a prescription for opiates from us.  I discussed with her that I was planning on discharging her on the Lidoderm patches, Mobic and Flexeril. I also discussed to her that I was not recommending for her to continue the treatment for opiates. I pointed out to her that she has a past history of addiction to opiates and these fracture opens the door for her to have a relapse.  In addition I pointed out to her that she was positive for cocaine upon arrival to the emergency room.  Patient denies trying to obtain opiates in order to "get high". Patient says she was to do the right thing to maintain her sobriety. She does not acknowledge having deceiving behavior about her visits to emergency departments and minimizes the use of cocaine and alcohol.  When asked if there were any other issues that she would like to address during her stay in behavioral health she is stated that she was having a lot of trouble with her concentration and that she felt that a lot of times she was not able to concentrate and accomplish or complete task.  Patient was advised to follow up with this concerns to her outpatient psychiatrist. However I feel that the patient was attempting to get prescribed with medication for ADHD.  I will discourage any attempts to add  more medications to her regimen especially controlled substances. This patient is in need of addressing her substance abuse issues. She continues to minimizes this  issue and does not seem to have an insight. Patient states she still does not feel well and does not feel she is ready to go home. She says she still feels very confused at times and foggy. Per nursing reports she still reported having hallucinations.  Per nursing: Pt endorses passive SI, on suicide precaution, contracts for safety. Pt is pleasant and cooperative.Pt stated she does not feels ready to go home. Patient appears anxious, minimal interaction with peers and staff noted.   Principal Problem: Major depressive disorder, recurrent episode, moderate (HCC) Diagnosis:   Patient Active Problem List   Diagnosis Date Noted  . Major depressive disorder, recurrent episode, moderate (HCC) [F33.1] 12/12/2015  . Closed pelvic fracture (HCC) [S32.9XXA] 10/31/2015  . Tobacco abuse [Z72.0] 10/31/2015  . Tobacco use disorder [F17.200] 03/28/2015  . PTSD (post-traumatic stress disorder) [F43.10] 03/28/2015  . Stimulant use disorder (cocaine) [F15.90] 03/28/2015  . Sedative, hypnotic or anxiolytic use disorder, severe, dependence (HCC) [F13.20] 03/27/2015  . Opioid use disorder, severe, dependence (HCC) [F11.20] 12/20/2014  . Alcohol use disorder severe. [F10.20] 12/18/2014  . Borderline personality disorder [F60.3] 12/18/2014   Total Time spent with patient: 30 minutes  Past Psychiatric History:   Past Medical History:  Past Medical History  Diagnosis Date  . CVA (cerebral infarction)   . Seizures (HCC)   . Depression   . Anxiety   . Suicide attempt by hanging (HCC)   . Alcohol use disorder severe. 12/18/2014  . 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 Borderline personality disorder 12/18/2014  . Opioid use disorder, severe, dependence (HCC) 12/20/2014  . Sedative, hypnotic or  anxiolytic use disorder, severe, dependence (HCC) 03/27/2015  . PTSD (post-traumatic stress disorder) 03/28/2015   History reviewed. No pertinent past surgical history. Family History:  Family History  Problem Relation Age of Onset  . Diabetes Father    Family Psychiatric  History:   Social History:  History  Alcohol Use  . 3.6 oz/week  . 6 Cans of beer per week    Comment: last used 6 months ago     History  Drug Use  . Yes  . Special: Benzodiazepines, Hydrocodone    Social History   Social History  . Marital Status: Single    Spouse Name: N/A  . Number of Children: N/A  . Years of Education: N/A   Social History Main Topics  . Smoking status: Current Every Day Smoker -- 1.00 packs/day    Types: Cigarettes  . Smokeless tobacco: None  . Alcohol Use: 3.6 oz/week    6 Cans of beer per week     Comment: last used 6 months ago  . Drug Use: Yes    Special: Benzodiazepines, Hydrocodone  . Sexual Activity: Yes    Birth Control/ Protection: IUD   Other Topics Concern  . None   Social History Narrative     Current Medications: Current Facility-Administered Medications  Medication Dose Route Frequency Provider Last Rate Last Dose  . acetaminophen (TYLENOL) tablet 1,000 mg  1,000 mg Oral Q6H PRN Jimmy Footman, Mason   1,000 mg at 12/14/15 0854  . alum & mag hydroxide-simeth (MAALOX/MYLANTA) 200-200-20 MG/5ML suspension 30 mL  30 mL Oral Q4H PRN Audery Amel, Mason      . cyclobenzaprine (FLEXERIL) tablet 10 mg  10 mg Oral TID PRN Jimmy Footman, Mason   10 mg at 12/14/15 0855  . docusate sodium (COLACE) capsule 200 mg  200 mg Oral BID Jimmy Footman, Mason   200 mg at 12/14/15 0853  . gabapentin (NEURONTIN) capsule 900 mg  900 mg Oral TID Audery Amel, Mason   900 mg at 12/14/15 0855  . lidocaine (LIDODERM) 5 % 2 patch  2 patch Transdermal Q24H Jimmy Footman, Mason   2 patch at 12/13/15 1000  . magnesium hydroxide (MILK OF MAGNESIA)  suspension 30 mL  30 mL Oral Daily PRN Audery Amel, Mason      . meloxicam (MOBIC) tablet 7.5 mg  7.5 mg Oral BID WC Jimmy Footman, Mason   7.5 mg at 12/14/15 0854  . mirtazapine (REMERON) tablet 15 mg  15 mg Oral QHS Jimmy Footman, Mason   15 mg at 12/13/15 2214  . nicotine (NICODERM CQ - dosed in mg/24 hours) patch 21 mg  21 mg Transdermal Daily Audery Amel, Mason   21 mg at 12/14/15 0856  . nitrofurantoin (macrocrystal-monohydrate) (MACROBID) capsule 100 mg  100 mg Oral Q12H Audery Amel, Mason   100 mg at 12/14/15 0854  . prazosin (MINIPRESS) capsule 2 mg  2 mg Oral QHS Audery Amel, Mason   2 mg at 12/13/15 2214  . senna-docusate (Senokot-S) tablet 1 tablet  1 tablet Oral  QHS PRN Audery Amel, Mason      . venlafaxine XR (EFFEXOR-XR) 24 hr capsule 225 mg  225 mg Oral Q breakfast Audery Amel, Mason   225 mg at 12/14/15 0855  . Vitamin D (Ergocalciferol) (DRISDOL) capsule 50,000 Units  50,000 Units Oral Q7 days Jimmy Footman, Mason   50,000 Units at 12/12/15 2200    Lab Results:  No results found for this or any previous visit (from the past 48 hour(s)).  Blood Alcohol level:  Lab Results  Component Value Date   ETH <5 03/25/2015   ETH <5 12/17/2014    Physical Findings: AIMS: Facial and Oral Movements Muscles of Facial Expression: None, normal Lips and Perioral Area: None, normal Jaw: None, normal Tongue: None, normal,Extremity Movements Upper (arms, wrists, hands, fingers): None, normal Lower (legs, knees, ankles, toes): None, normal, Trunk Movements Neck, shoulders, hips: None, normal, Overall Severity Severity of abnormal movements (highest score from questions above): None, normal Incapacitation due to abnormal movements: None, normal Patient's awareness of abnormal movements (rate only patient's report): No Awareness, Dental Status Current problems with teeth and/or dentures?: No Does patient usually wear dentures?: No  CIWA:    COWS:      Musculoskeletal: Strength & Muscle Tone: within normal limits Gait & Station: normal Patient leans: N/A  Psychiatric Specialty Exam: Review of Systems  Constitutional: Negative.   HENT: Negative.   Eyes: Negative.   Respiratory: Negative.   Cardiovascular: Negative.   Gastrointestinal: Negative.   Genitourinary: Negative.   Musculoskeletal: Positive for joint pain.  Skin: Negative.   Neurological: Negative.   Endo/Heme/Allergies: Negative.   Psychiatric/Behavioral: Positive for depression.       Passive suicidal ideation    Blood pressure 124/86, pulse 69, temperature 98.2 F (36.8 C), temperature source Oral, resp. rate 20, height  (1.727 m), weight 100.245 kg (221 lb), SpO2 100 %.Body mass index is 33.61 kg/(m^2).  General Appearance: Fairly Groomed  Patent attorney::  Good  Speech:  Clear and Coherent  Volume:  Normal  Mood:  Dysphoric  Affect:  Congruent  Thought Process:  Linear  Orientation:  Full (Time, Place, and Person)  Thought Content:  Hallucinations: None  Suicidal Thoughts:  No  Homicidal Thoughts:  No  Memory:  Immediate;   Fair Recent;   Fair Remote;   Fair  Judgement:  Poor  Insight:  Shallow  Psychomotor Activity:  Normal  Concentration:  Good  Recall:  Good  Fund of Knowledge:Good  Language: Good  Akathisia:  No  Handed:    AIMS (if indicated):     Assets:  Communication Skills Housing  ADL's:  Intact  Cognition: WNL  Sleep:  Number of Hours: 7   Treatment Plan Summary: Patient was admitted to the medical floor due to untreatable nausea vomiting and a UTI. Patient tells me that all of this is started 5 days after she ran out of baclofen. Likely be his episode of delirium and nausea and vomiting was triggered by withdrawal from baclofen. Patient is also mixing prescription medications for pain with illicit substances (in addition to alcohol).  This patient is not a good candidate for the treatment with opiates. The patient will be  discontinued as soon as possible by her outpatient provider. I will try to forward this information to him.  MDD: continue effexor XR 225 mg po q day. Do not plan to change Effexor  Insomnia: Continue remeron 15 mg po qhs.  Don't plan to change  PTSD related  nightmares: Continue prazosin 2 mg by mouth daily at bedtime  Delirium and withdrawals from baclofen: baclofen has been d/c and pt completed a short librium taper  Substance abuse issues: Patient has a long history of polysubstance abuse. Patient has abused benzodiazepines, opiates, cocaine and alcohol in the past. She was just discharged from a recovery house in [place] where she stayed for about 6 months. Patient says she is still sober however her urine toxicology screening is positive for cocaine. She also told Dr. Toni Amend that she had used alcohol prior to admission. Patient will be reconnected to [facility] for intensive outpatient substance abuse.  Tobacco use disorder: pt receiving nicotine patch 21 mg per day  Pain issues as a result of pelvic fracture 1 month ago:  -will d/c opioids today -Continue Lidoderm patch  -Continue mobic 7.5 mg po bid -Continue flexeril 10 mg tid prn -Continue neurontin 900 mg tid -Use tylenol prn for mild pain -PT consulted---no need for further PT services, no weight bearing restrictions  Opiate-induced constipation: Will order Colace 200 mg by mouth twice a day and Senokot daily at bedtime  UTI: Continue Macrobid will complete 7 days on May 20  Vitamin D deficiency the patient will be continued on vitamin D 50,000 units every week  Precautions every 15 minute checks  Diet regular   Now reporting SI, saying she is not safe to go home.  Irritated during treatment team. Left in the middle of the assessment saying that we didn't care about her pain.   Jimmy Footman, Mason 12/14/2015, 11:56 AM

## 2015-12-14 NOTE — Tx Team (Signed)
Interdisciplinary Treatment Plan Update (Adult)  Date:  12/14/2015 Time Reviewed:  9:58 AM  Progress in Treatment: Attending groups: No. Participating in groups:  No. Taking medication as prescribed:  Yes. Tolerating medication:  Yes. Family/Significant othe contact made:  No, will contact:  pt refused  Patient understands diagnosis:  Yes. Discussing patient identified problems/goals with staff:  Yes. Medical problems stabilized or resolved:  Yes. Denies suicidal/homicidal ideation: Yes. Issues/concerns per patient self-inventory:  Yes. Other:  New problem(s) identified: No, Describe:  NA  Discharge Plan or Barriers: Pt plans to return home and follow up with outpatient.    Reason for Continuation of Hospitalization: Depression Medication stabilization Suicidal ideation Withdrawal symptoms  Comments:Patient presented to our ER on 5/13 due to nausea and vomiting. Her urine toxicology was positive for cocaine for which she says she has not used cocaine and does not know how it got into her urine system. She also stated that she is having an out of body experience and feels like she wants to harm herself. Patient was admitted for 24 hours to the medical service for treatment of nausea,vomiting, hypokalemia and UTI. Psychiatry was consulted while the patient was on the medical floor and they recommended admission. Patient tells me she had been living in [place] for about 6 months in a halfway house. She says there was too much drama in the house and therefore she decided to leave. She just recently moved to her own apartment here in [place]. She says that everything was going well up until about a month ago when she fell from the balcony of her apartment (13 feet) and fractured her pelvis. Per chart review there are several red flags. She was admitted at [facility] psychiatric unit in March 18 after she was "kicked out" of [facility] for taking an extra neurontin. Per the records) and  looks like her fault that led to the pelvic fracture took place on April 3. She was seen in our emergency department and was given 40 oxycodone once. However she went back to [facility] emergency department on 4/11 and reported to them that she had had a fall a few days prior for which she was given Percocets. On 4/19 she was seen by internal medicine in [place] to [place] and she was given 10 days of Roxicodone. There is no plan for this patient to be continued on opiates. Patient remained concern during assessment was pain issues. She says she was not able to sleep last night because she was not given 2 tablets of opiates. She says that she just feels much worse when she is in pain and is unable to sleep.. The patient describes that prior to coming into the hospital she was hallucinating she felt that she was having an out of body experience. She described all of the symptoms and started 5 days after she ran out of baclofen.  Estimated length of stay: 3 days  New goal(s): NA  Review of initial/current patient goals per problem list:   1.  Goal(s): Patient will participate in aftercare plan * Met:  * Target date: at discharge * As evidenced by: Patient will participate within aftercare plan AEB aftercare provider and housing plan at discharge being identified.   2.  Goal (s): Patient will exhibit decreased depressive symptoms and suicidal ideations. * Met:  *  Target date: at discharge * As evidenced by: Patient will utilize self rating of depression at 3 or below and demonstrate decreased signs of depression or be deemed  stable for discharge by MD.   3.  Goal(s): Patient will demonstrate decreased signs and symptoms of anxiety. * Met:  * Target date: at discharge * As evidenced by: Patient will utilize self rating of anxiety at 3 or below and demonstrated decreased signs of anxiety, or be deemed stable for discharge by MD   4.  Goal(s): Patient will demonstrate decreased signs of withdrawal due to  substance abuse * Met:  * Target date: at discharge * As evidenced by: Patient will produce a CIWA/COWS score of 0, have stable vitals signs, and no symptoms of withdrawal.    Attendees: Patient:  Sandy Mason 5/17/20179:58 AM  Family:   5/17/20179:58 AM  Physician:  Dr. Jerilee Hoh   5/17/20179:58 AM  Nursing:   Carolynn Sayers, RN  5/17/20179:58 AM  Case Manager:   5/17/20179:58 AM  Counselor:   5/17/20179:58 AM  Other:  Wray Kearns, [place] 5/17/20179:58 AM  Other: Everitt Amber, LRT  5/17/20179:58 AM  Other:   5/17/20179:58 AM  Other:  5/17/20179:58 AM  Other:  5/17/20179:58 AM  Other:  5/17/20179:58 AM  Other:  5/17/20179:58 AM  Other:  5/17/20179:58 AM  Other:  5/17/20179:58 AM  Other:   5/17/20179:58 AM   Scribe for Treatment Team:   Wray Kearns MSW, [place] , 12/14/2015, 9:58 AM

## 2015-12-14 NOTE — Progress Notes (Signed)
Recreation Therapy Notes  At approximately 3:10 pm, LRT attempted assessment. Patient in bed sleeping.   Jacquelynn CreeGreene,Symphanie Cederberg M, LRT/CTRS 12/14/2015 3:13 PM
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
## 2015-12-14 NOTE — Progress Notes (Signed)
D: Pt endorses passive SI, on suicide precaution, contracts for safety. Pt is pleasant and cooperative.Pt stated she does not feels ready to go home. Patient appears anxious, minimal interaction  with peers and staff noted.  A: Pt was offered support and encouragement. Pt was given scheduled medications. Pt was encouraged to attend groups. Q 15 minute checks were done for safety.  R:Pt does not attend group.Pt is taking medication .Pt receptive to treatment and safety maintained on unit.

## 2015-12-14 NOTE — Progress Notes (Signed)
Recreation Therapy Notes  Date: 05.17.17 Time: 1:00 pm Location: Craft Room  Group Topic: Self-esteem  Goal Area(s) Addresses:  Patient will write at least one positive trait. Patient will verbalize benefit of having a healthy self-esteem.  Behavioral Response: Did not attend  Intervention: I Am  Activity: Patients were given a worksheet with the letter I on it and instructed to write as many positive traits inside the letter.  Education: LRT educated patients on ways they can increase their self-esteem.  Education Outcome: Patient did not attend group.  Clinical Observations/Feedback: Patient did not attend group.  Jacquelynn CreeGreene,Torrey Horseman M, LRT/CTRS 12/14/2015 2:02 PM

## 2015-12-15 MED ORDER — VENLAFAXINE HCL ER 75 MG PO CP24
300.0000 mg | ORAL_CAPSULE | Freq: Every day | ORAL | Status: DC
  Administered 2015-12-16: 300 mg via ORAL
  Filled 2015-12-15: qty 4

## 2015-12-15 MED ORDER — VENLAFAXINE HCL ER 75 MG PO CP24
75.0000 mg | ORAL_CAPSULE | Freq: Once | ORAL | Status: AC
  Administered 2015-12-15: 75 mg via ORAL
  Filled 2015-12-15: qty 1

## 2015-12-15 MED ORDER — CYCLOBENZAPRINE HCL 10 MG PO TABS
5.0000 mg | ORAL_TABLET | Freq: Three times a day (TID) | ORAL | Status: DC | PRN
  Administered 2015-12-15 – 2015-12-16 (×3): 5 mg via ORAL
  Filled 2015-12-15 (×3): qty 1

## 2015-12-15 MED ORDER — MIRTAZAPINE 15 MG PO TABS
7.5000 mg | ORAL_TABLET | Freq: Every day | ORAL | Status: DC
  Administered 2015-12-15: 7.5 mg via ORAL
  Filled 2015-12-15: qty 1

## 2015-12-15 MED ORDER — CYCLOBENZAPRINE HCL 10 MG PO TABS: 10.0000 mg | ORAL_TABLET | Freq: Three times a day (TID) | ORAL | Status: DC | PRN

## 2015-12-15 NOTE — Progress Notes (Signed)
Patient very irritable this morning, she stated that she was not being taken seriously in here. She stated that she felt like she was just being pushed to leave. "They are trying to get me to leave even though I feel like am not ready." She endorses passive SI but contracts for safety. However, she stated that she felt as if she was going to be suicidal when/afetr she leaves and the "bad" thoughts keep coming and going. Encouraged patient to discuss feelings with nursing staff rather than resorting to SIB. She verbalized understanding. Pain and anxiety management provided.
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
## 2015-12-15 NOTE — Plan of Care (Signed)
Problem: Alteration in thought process Goal: STG-Patient is able to discuss thoughts with staff Outcome: Progressing Patient is irritable and able to verbalize feelings to nursing staff.

## 2015-12-15 NOTE — [facility] Suicide Risk Assessment (Signed)
[facility]   Principal Problem: Major depressive disorder, recurrent episode, moderate (HCC) Discharge Diagnoses:  Patient Active Problem List   Diagnosis Date Noted  . Major depressive disorder, recurrent episode, moderate (HCC) [F33.1] 12/12/2015  . Closed pelvic fracture (HCC) [S32.9XXA] 10/31/2015  . Tobacco abuse [Z72.0] 10/31/2015  . Tobacco use disorder [F17.200] 03/28/2015  . PTSD (post-traumatic stress disorder) [F43.10] 03/28/2015  . Stimulant use disorder (cocaine) [F15.90] 03/28/2015  . Sedative, hypnotic or anxiolytic use disorder, severe, dependence (HCC) [F13.20] 03/27/2015  . Opioid use disorder, severe, dependence (HCC) [F11.20] 12/20/2014  . Alcohol use disorder severe. [F10.20] 12/18/2014  . Borderline personality disorder [F60.3] 12/18/2014      Psychiatric Specialty Exam: ROS  Blood pressure 125/81, pulse 68, temperature 98.6 F (37 C), temperature source Oral, resp. rate 20, height 5\' 8"  (1.727 m), weight 100.245 kg (221 lb), SpO2 100 %.Body mass index is 33.61 kg/(m^2).                                                       Mental Status Per Nursing Assessment::   On Admission:     Demographic Factors:  Low socioeconomic status and Living alone  Loss Factors: Financial problems/change in socioeconomic status  Historical Factors: Prior suicide attempts and Impulsivity  Risk Reduction Factors:   NA  Continued Clinical Symptoms:  Alcohol/Substance Abuse/Dependencies Personality Disorders:   Cluster B Comorbid depression Previous Psychiatric Diagnoses and Treatments  Cognitive Features That Contribute To Risk:  None    Suicide Risk:  Minimal: No identifiable suicidal ideation.  Patients presenting with no risk factors but with morbid ruminations; may be classified as minimal risk based on the severity of the depressive symptoms     Jimmy FootmanHernandez-Gonzalez,  Juelz Claar, MD 12/15/2015, 9:05 AM

## 2015-12-15 NOTE — [facility] Suicide Risk Assessment (Signed)
[facility] INPATIENT:  Family/Significant Other Suicide Prevention Education  Suicide Prevention Education:  Patient Refusal for Family/Significant Other Suicide Prevention Education: The patient Sandy Mason has refused to provide written consent for family/significant other to be provided Family/Significant Other Suicide Prevention Education during admission and/or prior to discharge.  Physician notified.  Daisy Floroandace L Jolan Mealor MSW, LCSWA  12/15/2015, 4:49 PM

## 2015-12-15 NOTE — Progress Notes (Signed)
[facility] MD Progress Note  12/15/2015 1:07 PM Sandy Mason  MRN:  [id number] Subjective: patient tells me today that she is feeling worse and having suicidal thoughts. Patient does not want to be discharged from the hospital because she thinks she'll kill herself. Patient has been very manipulative. She spends most of the day her room she has not been participating in any programming. She complains that she is not sleeping at night however she isn't sleeping in the daytime.  Patient has very limited insight into her addiction. She has been encouraging suggested to start attending groups and is been explained to her the most of the improvements seen in depression from psychiatric hospitalizations,'s from therapy and not medications.   On 5/17 "I just want to go to sleep and never wake up". Pt has been reporting passive SI since yesterday.  Still saying she is not safe to go home.  Pt continues to c/o severe pain.  She was seen by PT who feels there are no indication for further PT or weight bearing restrictions.  Pt was very upset today as I'm planning on d/c opioids.  Says no one cares about her pain issues.  Pt left room in the middle of the assessment.    On 5/16 Today I confronted the patient about then conflictual information in her chart.  Appears that the patient had the expectation that she was going to be discharged with a prescription for opiates from [place].  I discussed with her that I was planning on discharging her on the Lidoderm patches, Mobic and Flexeril. I also discussed to her that I was not recommending for her to continue the treatment for opiates. I pointed out to her that she has a past history of addiction to opiates and these fracture opens the door for her to have a relapse.  In addition I pointed out to her that she was positive for cocaine upon arrival to the emergency room.  Patient denies trying to obtain opiates in order to "get high". Patient says she was to do the right thing to  maintain her sobriety. She does not acknowledge having deceiving behavior about her visits to emergency departments and minimizes the use of cocaine and alcohol.  When asked if there were any other issues that she would like to address during her stay in behavioral health she is stated that she was having a lot of trouble with her concentration and that she felt that a lot of times she was not able to concentrate and accomplish or complete task.  Patient was advised to follow up with this concerns to her outpatient psychiatrist. However I feel that the patient was attempting to get prescribed with medication for ADHD.  I will discourage any attempts to add more medications to her regimen especially controlled substances. This patient is in need of addressing her substance abuse issues. She continues to minimizes this  issue and does not seem to have an insight. Patient states she still does not feel well and does not feel she is ready to go home. She says she still feels very confused at times and foggy. Per nursing reports she still reported having hallucinations.  Per nursing: Pt endorses passive SI, on suicide precaution, contracts for safety. Pt is pleasant and cooperative.Pt stated she does not feels ready to go home. Patient appears anxious, minimal interaction with peers and staff noted.   Principal Problem: Major depressive disorder, recurrent episode, moderate (HCC) Diagnosis:   Patient Active Problem List  Diagnosis Date Noted  . Major depressive disorder, recurrent episode, moderate (HCC) [F33.1] 12/12/2015  . Closed pelvic fracture (HCC) [S32.9XXA] 10/31/2015  . Tobacco abuse [Z72.0] 10/31/2015  . Tobacco use disorder [F17.200] 03/28/2015  . PTSD (post-traumatic stress disorder) [F43.10] 03/28/2015  . Stimulant use disorder (cocaine) [F15.90] 03/28/2015  . Sedative, hypnotic or anxiolytic use disorder, severe, dependence (HCC) [F13.20] 03/27/2015  . Opioid use disorder, severe,  dependence (HCC) [F11.20] 12/20/2014  . Alcohol use disorder severe. [F10.20] 12/18/2014  . Borderline personality disorder [F60.3] 12/18/2014   Total Time spent with patient: 30 minutes  Past Psychiatric History:   Past Medical History:  Past Medical History  Diagnosis Date  . CVA (cerebral infarction)   . Seizures (HCC)   . Depression   . Anxiety   . Suicide attempt by hanging (HCC)   . Alcohol use disorder severe. 12/18/2014  . 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 Borderline personality disorder 12/18/2014  . Opioid use disorder, severe, dependence (HCC) 12/20/2014  . 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 PTSD (post-traumatic stress disorder) 03/28/2015   History reviewed. No pertinent past surgical history. Family History:  Family History  Problem Relation Age of Onset  . Diabetes Father    Family Psychiatric  History:   Social History:  History  Alcohol Use  . 3.6 oz/week  . 6 Cans of beer per week    Comment: last used 6 months ago     History  Drug Use  . Yes  . Special: Benzodiazepines, Hydrocodone    Social History   Social History  . Marital Status: Single    Spouse Name: N/A  . Number of Children: N/A  . Years of Education: N/A   Social History Main Topics  . Smoking status: Current Every Day Smoker -- 1.00 packs/day    Types: Cigarettes  . Smokeless tobacco: None  . Alcohol Use: 3.6 oz/week    6 Cans of beer per week     Comment: last used 6 months ago  . Drug Use: Yes    Special: Benzodiazepines, Hydrocodone  . Sexual Activity: Yes    Birth Control/ Protection: IUD   Other Topics Concern  . None   Social History Narrative     Current Medications: Current Facility-Administered Medications  Medication Dose Route Frequency Provider Last Rate Last Dose  . acetaminophen (TYLENOL) tablet 1,000 mg  1,000 mg Oral Q6H PRN Jimmy Footman, MD    1,000 mg at 12/15/15 0653  . alum & mag hydroxide-simeth (MAALOX/MYLANTA) 200-200-20 MG/5ML suspension 30 mL  30 mL Oral Q4H PRN Audery Amel, MD      . cyclobenzaprine (FLEXERIL) tablet 10 mg  10 mg Oral TID PRN Jimmy Footman, MD   10 mg at 12/15/15 0654  . docusate sodium (COLACE) capsule 200 mg  200 mg Oral BID Jimmy Footman, MD   200 mg at 12/15/15 0845  . gabapentin (NEURONTIN) capsule 900 mg  900 mg Oral TID Audery Amel, MD   900 mg at 12/15/15 0845  . lidocaine (LIDODERM) 5 % 2 patch  2 patch Transdermal Q24H Jimmy Footman, MD   2 patch at 12/14/15 1709  . magnesium hydroxide (MILK OF MAGNESIA) suspension 30 mL  30 mL Oral Daily PRN Audery Amel, MD      . meloxicam (MOBIC) tablet 7.5 mg  7.5 mg Oral BID WC Jimmy Footman, MD   7.5 mg at 12/15/15 0846  .  mirtazapine (REMERON) tablet 15 mg  15 mg Oral QHS Jimmy Footman, MD   15 mg at 12/14/15 2120  . nicotine (NICODERM CQ - dosed in mg/24 hours) patch 21 mg  21 mg Transdermal Daily Audery Amel, MD   21 mg at 12/15/15 0844  . nitrofurantoin (macrocrystal-monohydrate) (MACROBID) capsule 100 mg  100 mg Oral Q12H Audery Amel, MD   100 mg at 12/15/15 0846  . prazosin (MINIPRESS) capsule 2 mg  2 mg Oral QHS Audery Amel, MD   2 mg at 12/14/15 2120  . senna-docusate (Senokot-S) tablet 1 tablet  1 tablet Oral QHS PRN Audery Amel, MD      . Melene Muller ON 12/16/2015] venlafaxine XR (EFFEXOR-XR) 24 hr capsule 300 mg  300 mg Oral Q breakfast Jimmy Footman, MD      . venlafaxine XR (EFFEXOR-XR) 24 hr capsule 75 mg  75 mg Oral Once Jimmy Footman, MD      . Vitamin D (Ergocalciferol) (DRISDOL) capsule 50,000 Units  50,000 Units Oral Q7 days Jimmy Footman, MD   50,000 Units at 12/12/15 2200    Lab Results:  No results found for this or any previous visit (from the past 48 hour(s)).  Blood Alcohol level:  Lab Results  Component Value Date   ETH <5  03/25/2015   ETH <5 12/17/2014    Physical Findings: AIMS: Facial and Oral Movements Muscles of Facial Expression: None, normal Lips and Perioral Area: None, normal Jaw: None, normal Tongue: None, normal,Extremity Movements Upper (arms, wrists, hands, fingers): None, normal Lower (legs, knees, ankles, toes): None, normal, Trunk Movements Neck, shoulders, hips: None, normal, Overall Severity Severity of abnormal movements (highest score from questions above): None, normal Incapacitation due to abnormal movements: None, normal Patient's awareness of abnormal movements (rate only patient's report): No Awareness, Dental Status Current problems with teeth and/or dentures?: No Does patient usually wear dentures?: No  CIWA:    COWS:     Musculoskeletal: Strength & Muscle Tone: within normal limits Gait & Station: normal Patient leans: N/A  Psychiatric Specialty Exam: Review of Systems  Constitutional: Negative.   HENT: Negative.   Eyes: Negative.   Respiratory: Negative.   Cardiovascular: Negative.   Gastrointestinal: Negative.   Genitourinary: Negative.   Musculoskeletal: Positive for joint pain.  Skin: Negative.   Neurological: Negative.   Endo/Heme/Allergies: Negative.   Psychiatric/Behavioral: Positive for depression and suicidal ideas.       Passive suicidal ideation    Blood pressure 125/81, pulse 68, temperature 98.6 F (37 C), temperature source Oral, resp. rate 20, height  (1.727 m), weight 100.245 kg (221 lb), SpO2 100 %.Body mass index is 33.61 kg/(m^2).  General Appearance: Fairly Groomed  Patent attorney::  Good  Speech:  Clear and Coherent  Volume:  Normal  Mood:  Dysphoric  Affect:  Congruent  Thought Process:  Linear  Orientation:  Full (Time, Place, and Person)  Thought Content:  Hallucinations: None  Suicidal Thoughts:  Yes.  without intent/plan  Homicidal Thoughts:  No  Memory:  Immediate;   Fair Recent;   Fair Remote;   Fair  Judgement:  Poor   Insight:  Shallow  Psychomotor Activity:  Normal  Concentration:  Good  Recall:  Good  Fund of Knowledge:Good  Language: Good  Akathisia:  No  Handed:    AIMS (if indicated):     Assets:  Communication Skills Housing  ADL's:  Intact  Cognition: WNL  Sleep:  Number of Hours: 8  Treatment Plan Summary: Patient was admitted to the medical floor due to untreatable nausea vomiting and a UTI. Patient tells me that all of this is started 5 days after she ran out of baclofen. Likely be his episode of delirium and nausea and vomiting was triggered by withdrawal from baclofen. Patient is also mixing prescription medications for pain with illicit substances (in addition to alcohol).  This patient is not a good candidate for the treatment with opiates. The patient will be discontinued as soon as possible by her outpatient provider. I will try to forward this information to him.  MDD: per her request I will increase the dose of Effexor to 300 mg a day as she says she still very depressed  Insomnia:I we'll decrease mirtazapine to 7.5 mg by mouth daily at bedtime as she has not been sleeping well at night however I feel that this is secondary to her sleeping throughout the day  PTSD related nightmares: Continue prazosin 2 mg by mouth daily at bedtime  Delirium and withdrawals from baclofen: baclofen has been d/c and pt completed a short librium taper  Substance abuse issues: Patient has a long history of polysubstance abuse. Patient has abused benzodiazepines, opiates, cocaine and alcohol in the past. She was just discharged from a recovery house in [place] where she stayed for about 6 months. Patient says she is still sober however her urine toxicology screening is positive for cocaine. She also told Dr. Toni Amendclapacs that she had used alcohol prior to admission. Patient will be reconnected to [facility] for intensive outpatient substance abuse.  Tobacco use disorder: pt receiving nicotine patch 21 mg per  day  Pain issues as a result of pelvic fracture 1 month ago:  -will d/c opioids today -Continue Lidoderm patch  -Continue mobic 7.5 mg po bid -Continue flexeril which will be decreased to 5 mg by mouth 3 times a day as needed -Continue neurontin 900 mg tid -Use tylenol prn for mild pain -PT consulted---no need for further PT services, no weight bearing restrictions  Opiate-induced constipation: Will order Colace 200 mg by mouth twice a day and Senokot daily at bedtime  UTI: Continue Macrobid will complete 7 days on May 20  Vitamin D deficiency the patient will be continued on vitamin D 50,000 units every week  Precautions every 15 minute checks  Diet regular     Jimmy FootmanHernandez-Gonzalez,  Yuette Putnam, MD 12/15/2015, 1:07 PM

## 2015-12-15 NOTE — Progress Notes (Signed)
Recreation Therapy Notes  Date: 05.18.17 Time: 1:00 pm Location: Craft Room  Group Topic: Leisure Education  Goal Area(s) Addresses:  Patient will identify activities for each letter of the alphabet. Patient will verbalize ability to integrate positive leisure into life post d/c. Patient will verbalize ability to use leisure as a Associate Professorcoping skill.  Behavioral Response: Did not attend  Intervention: Leisure Alphabet  Activity: Patients were given a Leisure Alphabet worksheet and instructed to pick a leisure activity for each letter of the alphabet.  Education: LRT educated patients on what they need to participate in leisure.  Education Outcome: Patient did not attend group.  Clinical Observations/Feedback: Patient did not attend group.  Jacquelynn CreeGreene,Taletha Twiford M, LRT/CTRS 12/15/2015 2:57 PM

## 2015-12-16 ENCOUNTER — Emergency Department
Admission: EM | Admit: 2015-12-16 | Discharge: 2015-12-19 | Disposition: A | Discharge status: Home / Self Care | Payer: Self-pay | Attending: Emergency Medicine | Admitting: Emergency Medicine

## 2015-12-16 DIAGNOSIS — F331 Major depressive disorder, recurrent, moderate: Secondary | ICD-10-CM | POA: Diagnosis present

## 2015-12-16 DIAGNOSIS — F332 Major depressive disorder, recurrent severe without psychotic features: Secondary | ICD-10-CM | POA: Insufficient documentation

## 2015-12-16 DIAGNOSIS — F32A Depression, unspecified: Secondary | ICD-10-CM

## 2015-12-16 DIAGNOSIS — F4310 Post-traumatic stress disorder, unspecified: Secondary | ICD-10-CM | POA: Diagnosis present

## 2015-12-16 DIAGNOSIS — Z8669 Personal history of other diseases of the nervous system and sense organs: Secondary | ICD-10-CM | POA: Insufficient documentation

## 2015-12-16 DIAGNOSIS — Z765 Malingerer [conscious simulation]: Secondary | ICD-10-CM

## 2015-12-16 DIAGNOSIS — F1120 Opioid dependence, uncomplicated: Secondary | ICD-10-CM | POA: Insufficient documentation

## 2015-12-16 DIAGNOSIS — F17210 Nicotine dependence, cigarettes, uncomplicated: Secondary | ICD-10-CM | POA: Insufficient documentation

## 2015-12-16 DIAGNOSIS — F329 Major depressive disorder, single episode, unspecified: Secondary | ICD-10-CM

## 2015-12-16 DIAGNOSIS — F603 Borderline personality disorder: Secondary | ICD-10-CM | POA: Diagnosis present

## 2015-12-16 DIAGNOSIS — Z8673 Personal history of transient ischemic attack (TIA), and cerebral infarction without residual deficits: Secondary | ICD-10-CM | POA: Insufficient documentation

## 2015-12-16 DIAGNOSIS — F1320 Sedative, hypnotic or anxiolytic dependence, uncomplicated: Secondary | ICD-10-CM | POA: Insufficient documentation

## 2015-12-16 LAB — CBC WITH DIFFERENTIAL/PLATELET
BASOS ABS: 0.1 10*3/uL (ref 0–0.1)
BASOS PCT: 1 %
EOS PCT: 2 %
Eosinophils Absolute: 0.2 10*3/uL (ref 0–0.7)
HEMATOCRIT: 40.3 % (ref 35.0–47.0)
Hemoglobin: 13.6 g/dL (ref 12.0–16.0)
LYMPHS PCT: 36 %
Lymphs Abs: 3.1 10*3/uL (ref 1.0–3.6)
MCH: 32 pg (ref 26.0–34.0)
MCHC: 33.7 g/dL (ref 32.0–36.0)
MCV: 94.8 fL (ref 80.0–100.0)
Monocytes Absolute: 0.6 10*3/uL (ref 0.2–0.9)
Monocytes Relative: 7 %
NEUTROS ABS: 4.6 10*3/uL (ref 1.4–6.5)
Neutrophils Relative %: 54 %
PLATELETS: 340 10*3/uL (ref 150–440)
RBC: 4.25 MIL/uL (ref 3.80–5.20)
RDW: 12.9 % (ref 11.5–14.5)
WBC: 8.4 10*3/uL (ref 3.6–11.0)

## 2015-12-16 LAB — COMPREHENSIVE METABOLIC PANEL
ALT: 21 U/L (ref 14–54)
ANION GAP: 5 (ref 5–15)
AST: 27 U/L (ref 15–41)
Albumin: 3.8 g/dL (ref 3.5–5.0)
Alkaline Phosphatase: 63 U/L (ref 38–126)
BUN: 11 mg/dL (ref 6–20)
CHLORIDE: 106 mmol/L (ref 101–111)
CO2: 27 mmol/L (ref 22–32)
Calcium: 9.1 mg/dL (ref 8.9–10.3)
Creatinine, Ser: 0.67 mg/dL (ref 0.44–1.00)
GFR calc non Af Amer: >60 mL/min (ref >60)
Glucose, Bld: 115 mg/dL — ABNORMAL HIGH (ref 65–99)
Potassium: 4.2 mmol/L (ref 3.5–5.1)
SODIUM: 138 mmol/L (ref 135–145)
Total Bilirubin: 0.5 mg/dL (ref 0.3–1.2)
Total Protein: 6.9 g/dL (ref 6.5–8.1)

## 2015-12-16 LAB — URINE DRUG SCREEN, QUALITATIVE (ARMC ONLY)
Amphetamines, Ur Screen: NOT DETECTED
BARBITURATES, UR SCREEN: NOT DETECTED
BENZODIAZEPINE, UR SCRN: POSITIVE — AB
CANNABINOID 50 NG, UR ~~LOC~~: NOT DETECTED
Cocaine Metabolite,Ur ~~LOC~~: NOT DETECTED
MDMA (Ecstasy)Ur Screen: NOT DETECTED
Methadone Scn, Ur: NOT DETECTED
Opiate, Ur Screen: NOT DETECTED
PHENCYCLIDINE (PCP) UR S: NOT DETECTED
Tricyclic, Ur Screen: NOT DETECTED

## 2015-12-16 LAB — URINALYSIS COMPLETE WITH MICROSCOPIC (ARMC ONLY)
BILIRUBIN URINE: NEGATIVE
Bacteria, UA: NONE SEEN
Glucose, UA: NEGATIVE mg/dL
HGB URINE DIPSTICK: NEGATIVE
Ketones, ur: NEGATIVE mg/dL
LEUKOCYTES UA: NEGATIVE
NITRITE: NEGATIVE
PH: 7 (ref 5.0–8.0)
Protein, ur: NEGATIVE mg/dL
SPECIFIC GRAVITY, URINE: 1.013 (ref 1.005–1.030)

## 2015-12-16 LAB — SALICYLATE LEVEL

## 2015-12-16 LAB — ACETAMINOPHEN LEVEL: Acetaminophen (Tylenol), Serum: <10 ug/mL — ABNORMAL LOW (ref 10–30)

## 2015-12-16 MED ORDER — VENLAFAXINE HCL ER 75 MG PO CP24
300.0000 mg | ORAL_CAPSULE | Freq: Every day | ORAL | Status: DC
  Administered 2015-12-17: 300 mg via ORAL
  Filled 2015-12-16: qty 4

## 2015-12-16 MED ORDER — MIRTAZAPINE 15 MG PO TABS
7.5000 mg | ORAL_TABLET | Freq: Every day | ORAL | Status: DC
  Administered 2015-12-16: 7.5 mg via ORAL
  Filled 2015-12-16: qty 1

## 2015-12-16 MED ORDER — VENLAFAXINE HCL ER 150 MG PO CP24: 300.0000 mg | ORAL_CAPSULE | Freq: Every day | ORAL | Status: DC

## 2015-12-16 MED ORDER — MIRTAZAPINE 7.5 MG PO TABS: 7.5000 mg | ORAL_TABLET | Freq: Every day | ORAL | Status: DC

## 2015-12-16 MED ORDER — NITROFURANTOIN MONOHYD MACRO 100 MG PO CAPS
100.0000 mg | ORAL_CAPSULE | Freq: Two times a day (BID) | ORAL | Status: DC
  Administered 2015-12-16 – 2015-12-19 (×6): 100 mg via ORAL
  Filled 2015-12-16 (×6): qty 1

## 2015-12-16 MED ORDER — NICOTINE 21 MG/24HR TD PT24
21.0000 mg | MEDICATED_PATCH | Freq: Every day | TRANSDERMAL | Status: DC
  Administered 2015-12-17 – 2015-12-19 (×3): 21 mg via TRANSDERMAL
  Filled 2015-12-16 (×3): qty 1

## 2015-12-16 MED ORDER — MELOXICAM 7.5 MG PO TABS
7.5000 mg | ORAL_TABLET | Freq: Two times a day (BID) | ORAL | Status: DC
  Administered 2015-12-17 – 2015-12-19 (×5): 7.5 mg via ORAL
  Filled 2015-12-16 (×10): qty 1

## 2015-12-16 MED ORDER — LIDOCAINE 5 % EX PTCH
1.0000 | MEDICATED_PATCH | CUTANEOUS | Status: DC
  Administered 2015-12-17 – 2015-12-18 (×2): 1 via TRANSDERMAL
  Filled 2015-12-16 (×5): qty 1

## 2015-12-16 MED ORDER — NITROFURANTOIN MONOHYD MACRO 100 MG PO CAPS: 100.0000 mg | ORAL_CAPSULE | Freq: Two times a day (BID) | ORAL | Status: DC

## 2015-12-16 MED ORDER — GABAPENTIN 300 MG PO CAPS
900.0000 mg | ORAL_CAPSULE | Freq: Three times a day (TID) | ORAL | Status: DC
  Administered 2015-12-16 – 2015-12-17 (×2): 900 mg via ORAL
  Filled 2015-12-16 (×2): qty 3

## 2015-12-16 MED ORDER — CYCLOBENZAPRINE HCL 5 MG PO TABS: 5.0000 mg | ORAL_TABLET | Freq: Three times a day (TID) | ORAL | Status: DC | PRN

## 2015-12-16 MED ORDER — ALPRAZOLAM 0.5 MG PO TABS
0.5000 mg | ORAL_TABLET | Freq: Once | ORAL | Status: AC
  Administered 2015-12-16: 0.5 mg via ORAL
  Filled 2015-12-16: qty 1

## 2015-12-16 MED ORDER — GABAPENTIN 300 MG PO CAPS: 900.0000 mg | ORAL_CAPSULE | Freq: Three times a day (TID) | ORAL | Status: DC

## 2015-12-16 NOTE — Progress Notes (Signed)
  [facility]  Will you be returning to the same living situation after discharge:  Yes,  home  At discharge, do you have transportation home?: Yes,  link bus  Do you have the ability to [facility]: [facility]  Release of information consent forms completed and in the chart;  Patient's signature needed at discharge.  Patient to Follow up at: Follow-up Information    Follow up with [facility] On 12/19/2015.   Why:  Your hospital follow up appointment will be walk in. Walk in hours are Monday through Friday between 8:00ama nd 10:00am.    Contact information:   [address] [facility]  [phone]       Next level of care provider has [facility] Link:no  Safety Planning and Suicide Prevention discussed: Yes,  with patient   Have you used any form of tobacco in the last 30 days? (Cigarettes, Smokeless Tobacco, Cigars, and/or Pipes): No  Has patient been referred to the Quitline?: N/A patient is not a smoker  Patient has been referred for addiction treatment: Pt. refused referral  Rondall AllegraCandace L Bri Wakeman MSW, LCSWA  12/16/2015, 11:50 AM

## 2015-12-16 NOTE — [facility] Suicide Risk Assessment (Addendum)
[facility]   Principal Problem: Major depressive disorder, recurrent episode, moderate (HCC) Discharge Diagnoses:  Patient Active Problem List   Diagnosis Date Noted  . Major depressive disorder, recurrent episode, moderate (HCC) [F33.1] 12/12/2015  . Closed pelvic fracture (HCC) [S32.9XXA] 10/31/2015  . Tobacco abuse [Z72.0] 10/31/2015  . Tobacco use disorder [F17.200] 03/28/2015  . PTSD (post-traumatic stress disorder) [F43.10] 03/28/2015  . Stimulant use disorder (cocaine) [F15.90] 03/28/2015  . Sedative, hypnotic or anxiolytic use disorder, severe, dependence (HCC) [F13.20] 03/27/2015  . Opioid use disorder, severe, dependence (HCC) [F11.20] 12/20/2014  . Alcohol use disorder severe. [F10.20] 12/18/2014  . Borderline personality disorder [F60.3] 12/18/2014    Psychiatric Specialty Exam: ROS  Blood pressure 127/78, pulse 68, temperature 97.9 F (36.6 C), temperature source Oral, resp. rate 20, height 5\' 8"  (1.727 m), weight 100.245 kg (221 lb), SpO2 100 %.Body mass index is 33.61 kg/(m^2).                                                       Mental Status Per Nursing Assessment::   On Admission:     Demographic Factors:  Caucasian and Living alone  Loss Factors: Financial problems/change in socioeconomic status  Historical Factors: Impulsivity  Risk Reduction Factors:   Sense of responsibility to family and Employed  Continued Clinical Symptoms:  Alcohol/Substance Abuse/Dependencies Personality Disorders:   Cluster B Previous Psychiatric Diagnoses and Treatments  Cognitive Features That Contribute To Risk:  None    Suicide Risk:  Acute suicide risk Minimal: No identifiable suicidal ideation.  Patients presenting with no risk factors but with morbid ruminations; may be classified as minimal risk based on the severity of the depressive symptoms  Chronic suicidal ideation   Sandy Mason,  Sandy Fonder,  MD 12/16/2015, 10:41 AM

## 2015-12-16 NOTE — ED Notes (Signed)
Pt just d/c'd from behavioral medicine. Pt is crying in triage, states that she doesn't want to go home that she doesn't trust herself.

## 2015-12-16 NOTE — ED Notes (Signed)
This is a 40 y.o. Female, who presented to the ED for c/o; "I was withdrawing from Baclofen; and I was hallucinating; I could see outside myself; I fell a few weeks ago and hurt my pelvis; and since being on the Baclofen and the fall; that's made me more depressed; I feel like my brain is being squeezed; I don't trust myself by myself; because, I have a h/o being impulsive."

## 2015-12-16 NOTE — BH Assessment (Signed)
Assessment Note  Sandy LieuJennifer J Mason is an 40 y.o. female presents to the ED with a history of PTSD and borderline personality disorder.  Pt was recently discharged [facility] and has now returned to the ED continuing to voice complaints about suicidal thoughts of jumping in front of traffic.  Since being [facility] on [facility]/15, patient has followed up with [facility] for outpatient therapy.  She is now stating that she has an appointment with [facility] on 12/19/2015 and wants to stay in the ED until her appointment.  Diagnosis: Suicidal  Past Medical History:  Past Medical History  Diagnosis Date  . CVA (cerebral infarction)   . Seizures (HCC)   . Depression   . Anxiety   . Suicide attempt by hanging (HCC)   . Alcohol use disorder severe. 12/18/2014  . 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 Borderline personality disorder 12/18/2014  . Opioid use disorder, severe, dependence (HCC) 12/20/2014  . 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 PTSD (post-traumatic stress disorder) 03/28/2015    No past surgical history on file.  Family History:  Family History  Problem Relation Age of Onset  . Diabetes Father     Social History:  reports that she has been smoking Cigarettes.  She has been smoking about 1.00 pack per day. She does not have any smokeless tobacco history on file. She reports that she drinks about 3.6 oz of alcohol per week. She reports that she uses illicit drugs (Benzodiazepines and Hydrocodone).  Additional Social History:  Alcohol / Drug Use History of alcohol / drug use?: Yes (Pt has a prior history of alcohol abuse.)  CIWA: CIWA-Ar BP: 124/76 mmHg Pulse Rate: 65 COWS:    Allergies:  Allergies  Allergen Reactions  . Depakote [Valproic Acid] Other (See Comments)    Reaction:  Seizures   . Haldol [Haloperidol Lactate] Other (See Comments)    Reaction:  Seizures   . Keflet  [Cephalexin] Rash    Home Medications:  (Not in a hospital admission)  OB/GYN Status:  No LMP recorded. Patient is not currently having periods (Reason: IUD).  General Assessment Data [facility] of Assessment[facility] TTS Assessment: In system Is this a Tele or Face-to-Face Assessment?: Face-to-Face Is this an Initial Assessment or a Re-assessment for this encounter?: Initial Assessment Marital status: Single Maiden name: N/A Is patient pregnant?: No Pregnancy Status: No Living Arrangements: Non-relatives/Friends Can pt return to current living arrangement?: Yes Admission Status: Voluntary Is patient capable of signing voluntary admission?: Yes Referral Source: Self/Family/Friend Insurance type: Self pay  Medical Screening Exam [facility] Walk-in ONLY) Medical Exam completed: Yes  Crisis Care Plan Living Arrangements: Non-relatives/Friends Legal Guardian: Other: (self) Name of Psychiatrist: None reported Name of Therapist: None reported  Education Status Is patient currently in school?: No Current Grade: N/A Highest grade of school patient has completed: N/A Name of school: N/A Contact person: N/A  Risk to self with the past 6 months Suicidal Ideation: No Has patient been a risk to self within the past 6 months prior to admission? : Yes Suicidal Intent: No Has patient had any suicidal intent within the past 6 months prior to admission? : Yes Is patient at risk for suicide?: No Suicidal Plan?: No Has patient had any suicidal plan within the past 6 months prior to admission? : Yes Access to Means: Yes Specify Access to Suicidal Means: Pt previously reported that she had access to ropes  What has been your use of drugs/alcohol within the last 12 months?: alcohol Previous Attempts/Gestures: Yes How many times?: 1 Other Self Harm Risks: None identified Triggers for Past Attempts: Unknown Intentional Self Injurious Behavior: None Family Suicide History: No Recent stressful  life event(s): Financial Problems, Other (Comment) (lacks stable housing) Persecutory voices/beliefs?: No Depression: Yes Depression Symptoms: Loss of interest in usual pleasures Substance abuse history and/or treatment for substance abuse?: Yes Suicide prevention information given to non-admitted patients: Not applicable  Risk to Others within the past 6 months Homicidal Ideation: No Does patient have any lifetime risk of violence toward others beyond the six months prior to admission? : No Thoughts of Harm to Others: No Current Homicidal Intent: No Current Homicidal Plan: No Access to Homicidal Means: No Identified Victim: None identified History of harm to others?: No Assessment of Violence: None Noted Violent Behavior Description: None identified Does patient have access to weapons?: No Criminal Charges Pending?: No Does patient have a court date: No Is patient on probation?: No  Psychosis Hallucinations: None noted Delusions: None noted  Mental Status Report Appearance/Hygiene: In scrubs Eye Contact: Fair Motor Activity: Freedom of movement Speech: Logical/coherent Level of Consciousness: Quiet/awake, Alert Mood: Pleasant Affect: Anxious Anxiety Level: Minimal Thought Processes: Coherent, Relevant Judgement: Unimpaired Orientation: Person, Place, Time, Situation Obsessive Compulsive Thoughts/Behaviors: None  Cognitive Functioning Concentration: Normal Memory: Recent Intact, Remote Intact IQ: Average Insight: Fair Impulse Control: Fair Appetite: Good Weight Loss: 0 Weight Gain: 0 Sleep: No Change Total Hours of Sleep: 8 Vegetative Symptoms: None  ADLScreening [facility] Assessment Services) Patient's cognitive ability adequate to safely complete daily activities?: Yes Patient able to express need for assistance with ADLs?: Yes Independently performs ADLs?: Yes (appropriate for developmental age)  Prior Inpatient Therapy Prior Inpatient Therapy: Yes Prior  Therapy Dates: 11/2015 Prior Therapy Facilty/Provider(s): [facility] Reason for Treatment: PTSD  Prior Outpatient Therapy Prior Outpatient Therapy: No Prior Therapy Dates: N/A Prior Therapy Facilty/Provider(s): N/A Reason for Treatment: N/A Does patient have an ACCT team?: No Does patient have Intensive In-House Services?  : No Does patient have [facility] services? : No Does patient have P4CC services?: No  ADL Screening (condition at time of admission) Patient's cognitive ability adequate to safely complete daily activities?: Yes Patient able to express need for assistance with ADLs?: Yes Independently performs ADLs?: Yes (appropriate for developmental age)       Abuse/Neglect Assessment (Assessment to be complete while patient is alone) Physical Abuse: Denies Verbal Abuse: Denies Sexual Abuse: Denies Exploitation of patient/patient's resources: Denies Self-Neglect: Denies Values / Beliefs Cultural Requests During Hospitalization: None Spiritual Requests During Hospitalization: None Consults Spiritual Care Consult Needed: No Social Work Consult Needed: No Merchant navy officer (For Healthcare) Does patient have an advance directive?: No    Additional Information 1:1 In Past 12 Months?: No CIRT Risk: No Elopement Risk: No Does patient have medical clearance?: Yes     Disposition:  Disposition Initial Assessment Completed for this Encounter: Yes Disposition of Patient: Other dispositions Other disposition(s): Other (Comment) (Pending Psych MD consult)  On Site Evaluation by:   Reviewed with Physician:    Manus Rudd Hamed Debella 12/16/2015 11:05 PM

## 2015-12-16 NOTE — ED Notes (Addendum)
Pt reports  "I just left downstairs in the behavioral unit - I was just discharged and I told them that I am still suicidal  - I have been down there for five days and I was not ready to be discharged but they ignored me - I don't think they care - I begged them to keep me.  What would have happened if I had walked into traffic as soon as I left down there - do you think they would have cared?  They made me leave at about four oclock and I walked around the building and checked right back in."

## 2015-12-16 NOTE — ED Notes (Signed)
BEHAVIORAL HEALTH ROUNDING Patient sleeping: No. Patient alert and oriented: yes Behavior appropriate: Yes.  ; If no, describe:  Nutrition and fluids offered: yes Toileting and hygiene offered: Yes  Sitter present: q15 minute observations and security  monitoring Law enforcement present: Yes  ODS  

## 2015-12-16 NOTE — ED Notes (Signed)
Pt given a phone call  

## 2015-12-16 NOTE — Tx Team (Signed)
Interdisciplinary Treatment Plan Update (Adult)  Date:  12/16/2015 Time Reviewed:  11:51 AM  Progress in Treatment: Attending groups: No. Participating in groups:  No. Taking medication as prescribed:  Yes. Tolerating medication:  Yes. Family/Significant othe contact made:  No, will contact:  pt refused  Patient understands diagnosis:  No. and As evidenced by:  Limited insight  Discussing patient identified problems/goals with staff:  Yes. Medical problems stabilized or resolved:  Yes. Denies suicidal/homicidal ideation: Yes. Issues/concerns per patient self-inventory:  Yes. Other:  New problem(s) identified: No, Describe:  NA  Discharge Plan or Barriers: Pt plans to return home and follow up with outpatient.    Reason for Continuation of Hospitalization: Depression Medication stabilization Suicidal ideation  Comments: Patient does not want to be discharged from the hospital because she thinks she'll kill herself. Patient has been very manipulative. She spends most of the day her room she has not been participating in any programming. She complains that she is not sleeping at night however she is sleeping in the daytime. Patient has very limited insight into her addiction. She has been encouraging suggested to start attending groups and is been explained to her the most of the improvements seen in depression from psychiatric hospitalizations is from therapy and not medications.  Estimated length of stay: Pt will likely d/c today.   New goal(s): NA  Review of initial/current patient goals per problem list:   1.  Goal(s): Patient will participate in aftercare plan * Met: 12/16/2015  * Target date: at discharge * As evidenced by: Patient will participate within aftercare plan AEB aftercare provider and housing plan at discharge being identified.   2.  Goal (s): Patient will exhibit decreased depressive symptoms and suicidal ideations. * Met: 12/16/2015  *  Target date: at  discharge * As evidenced by: Patient will utilize self rating of depression at 3 or below and demonstrate decreased signs of depression or be deemed stable for discharge by MD.   3.  Goal(s): Patient will demonstrate decreased signs and symptoms of anxiety. * Met: 12/16/2015  * Target date: at discharge * As evidenced by: Patient will utilize self rating of anxiety at 3 or below and demonstrated decreased signs of anxiety, or be deemed stable for discharge by MD   4.  Goal(s): Patient will demonstrate decreased signs of withdrawal due to substance abuse * Met: 12/16/2015  * Target date: at discharge * As evidenced by: Patient will produce a CIWA/COWS score of 0, have stable vitals signs, and no symptoms of withdrawal.  Attendees: Patient:  Sandy Mason 5/19/201711:51 AM  Family:   5/19/201711:51 AM  Physician:  Dr. Jerilee Hoh   5/19/201711:51 AM  Nursing:   Junita Push, RN  5/19/201711:51 AM  Case Manager:   5/19/201711:51 AM  Counselor:   5/19/201711:51 AM  Other:  Wray Kearns, LCSWA 5/19/201711:51 AM  Other:  Everitt Amber, LRT  5/19/201711:51 AM  Other:   5/19/201711:51 AM  Other:  5/19/201711:51 AM  Other:  5/19/201711:51 AM  Other:  5/19/201711:51 AM  Other:  5/19/201711:51 AM  Other:  5/19/201711:51 AM  Other:  5/19/201711:51 AM  Other:   5/19/201711:51 AM   Scribe for Treatment Team:   Wray Kearns, MSW, [place]  12/16/2015, 11:51 AM

## 2015-12-16 NOTE — ED Provider Notes (Signed)
[facility] Emergency Department Provider Note  ____________________________________________   I have reviewed the triage vital signs and the nursing notes.   HISTORY  Chief Complaint Suicidal    HPI Sandy Mason is a 40 y.o. female who was discharged today from the behavioral unit for suicidal thoughts continues to have suicidal thoughts. She states she is contemplating jumping into traffic. Forcefully, has not actually jumped into traffic. Denies taking an overdose. Notes suggest that she was manipulative and they felt using suicidality for secondary gain as a place to stay in the department downstairs. Patient herself states she needs to be here all weekend so she can contact [facility] on Monday.     Past Medical History  Diagnosis Date  . CVA (cerebral infarction)   . Seizures (HCC)   . Depression   . Anxiety   . Suicide attempt by hanging (HCC)   . Alcohol use disorder severe. 12/18/2014  . 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 Borderline personality disorder 12/18/2014  . Opioid use disorder, severe, dependence (HCC) 12/20/2014  . 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 PTSD (post-traumatic stress disorder) 03/28/2015    Patient Active Problem List   Diagnosis Date Noted  . Malingering 12/16/2015  . Major depressive disorder, recurrent episode, moderate (HCC) 12/12/2015  . Closed pelvic fracture (HCC) 10/31/2015  . Tobacco abuse 10/31/2015  . Tobacco use disorder 03/28/2015  . PTSD (post-traumatic stress disorder) 03/28/2015  . Stimulant use disorder (cocaine) 03/28/2015  . 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 Opioid use disorder, severe, dependence (HCC) 12/20/2014  . Alcohol use disorder severe. 12/18/2014  . Borderline personality disorder 12/18/2014    No past surgical history on file.  Current  Outpatient Rx  Name  Route  Sig  Dispense  Refill  . cyclobenzaprine (FLEXERIL) 5 MG tablet   Oral   Take 1 tablet (5 mg total) by mouth 3 (three) times daily as needed for muscle spasms.   15 tablet   0   . gabapentin (NEURONTIN) 300 MG capsule   Oral   Take 3 capsules (900 mg total) by mouth 3 (three) times daily.   120 capsule   0   . lidocaine (LIDODERM) 5 %   Transdermal   Place 2 patches onto the skin daily. Remove & Discard patch within 12 hours or as directed by MD   60 patch   0   . meloxicam (MOBIC) 7.5 MG tablet   Oral   Take 1 tablet (7.5 mg total) by mouth 2 (two) times daily with a meal.   60 tablet   0   . mirtazapine (REMERON) 7.5 MG tablet   Oral   Take 1 tablet (7.5 mg total) by mouth at bedtime.   30 tablet   0   . nitrofurantoin, macrocrystal-monohydrate, (MACROBID) 100 MG capsule   Oral   Take 1 capsule (100 mg total) by mouth every 12 (twelve) hours.   3 capsule   0   . prazosin (MINIPRESS) 2 MG capsule   Oral   Take 1 capsule (2 mg total) by mouth at bedtime.   30 capsule   0   . venlafaxine XR (EFFEXOR-XR) 150 MG 24 hr capsule   Oral   Take 2 capsules (300 mg total) by mouth daily with breakfast.   30 capsule   0   . Vitamin D, Ergocalciferol, (DRISDOL) 50000 units CAPS  capsule   Oral   Take 50,000 Units by mouth once a week.           Allergies Depakote; Haldol; and Keflet  Family History  Problem Relation Age of Onset  . Diabetes Father     Social History Social History  Substance Use Topics  . Smoking status: Current Every Day Smoker -- 1.00 packs/day    Types: Cigarettes  . Smokeless tobacco: Not on file  . Alcohol Use: 3.6 oz/week    6 Cans of beer per week     Comment: last used 6 months ago    Review of Systems Constitutional: No fever/chills Eyes: No visual changes. ENT: No sore throat. No stiff neck no neck pain Cardiovascular: Denies chest pain. Respiratory: Denies shortness of  breath. Gastrointestinal:   no vomiting.  No diarrhea.  No constipation. Genitourinary: Negative for dysuria. Musculoskeletal: Negative lower extremity swelling Skin: Negative for rash. Neurological: Negative for headaches, focal weakness or numbness. 10-point ROS otherwise negative.  ____________________________________________   PHYSICAL EXAM:  VITAL SIGNS: ED Triage Vitals  Enc Vitals Group     BP 12/16/15 1617 119/87 mmHg     Pulse Rate 12/16/15 1617 76     Resp 12/16/15 1617 18     Temp 12/16/15 1617 98 F (36.7 C)     Temp Source 12/16/15 1617 Oral     SpO2 12/16/15 1617 97 %     Weight 12/16/15 1617 236 lb (107.049 kg)     Height 12/16/15 1617 5\' 8"  (1.727 m)     Head Cir --      Peak Flow --      Pain Score 12/16/15 1620 10     Pain Loc --      Pain Edu? --      Excl. in GC? --     Constitutional: Alert and oriented. Well appearing and in no acute distress. Eyes: Conjunctivae are normal. PERRL. EOMI. Head: Atraumatic. Nose: No congestion/rhinnorhea. Mouth/Throat: Mucous membranes are moist.  Oropharynx non-erythematous. Neck: No stridor.   Nontender with no meningismus Cardiovascular: Normal rate, regular rhythm. Grossly normal heart sounds.  Good peripheral circulation. Respiratory: Normal respiratory effort.  No retractions. Lungs CTAB. Abdominal: Soft and nontender. No distention. No guarding no rebound Back:  There is no focal tenderness or step off there is no midline tenderness there are no lesions noted. there is no CVA tenderness Musculoskeletal: No lower extremity tenderness. No joint effusions, no DVT signs strong distal pulses no edema Neurologic:  Normal speech and language. No gross focal neurologic deficits are appreciated.  Skin:  Skin is warm, dry and intact. No rash noted. Psychiatric: Mood and affect are normal. Speech and behavior are normal.  ____________________________________________   LABS (all labs ordered are listed, but only  abnormal results are displayed)  Labs Reviewed  URINE DRUG SCREEN, QUALITATIVE ([facility] ONLY) - Abnormal; Notable for the following:    Benzodiazepine, Ur Scrn POSITIVE (*)    All other components within normal limits  COMPREHENSIVE METABOLIC PANEL  ACETAMINOPHEN LEVEL  SALICYLATE LEVEL  CBC WITH DIFFERENTIAL/PLATELET  URINALYSIS COMPLETEWITH MICROSCOPIC ([facility] ONLY)  POC URINE PREG, ED   ____________________________________________  EKG  I personally interpreted any EKGs ordered by me or triage  ____________________________________________  RADIOLOGY  I reviewed any imaging ordered by me or triage that were performed during my shift and, if possible, patient and/or family made aware of any abnormal findings. ____________________________________________   PROCEDURES  Procedure(s) performed: None  Critical Care performed:  None  ____________________________________________   INITIAL IMPRESSION / ASSESSMENT AND PLAN / ED COURSE  Pertinent labs & imaging results that were available during my care of the patient were reviewed by me and considered in my medical decision making (see chart for details).  Patient with ongoing suicidal thoughts apparently. We will watch her and have a psychiatrist see her. ____________________________________________   FINAL CLINICAL IMPRESSION(S) / ED DIAGNOSES  Final diagnoses:  None      This chart was dictated using voice recognition software.  Despite best efforts to proofread,  errors can occur which can change meaning.     Jeanmarie Plant, MD 12/16/15 1840

## 2015-12-16 NOTE — ED Notes (Signed)
Report was received from Noel W., RN; Pt. Verbalizes no complaints or distress; verbalizes having S.I.; denies having Hi. Continue to monitor with 15 min. Monitoring. 

## 2015-12-16 NOTE — [facility] Group Notes (Signed)
[facility] Group Notes:  (Nursing/MHT/Case Management/Adjunct)  Date:  12/16/2015  Time:  4:10 PM  Type of Therapy:  Psychoeducational Skills  Participation Level:  Did Not Attend    [name] 12/16/2015, 4:10 PM

## 2015-12-16 NOTE — ED Notes (Signed)

## 2015-12-16 NOTE — Progress Notes (Signed)
Patient stated that she is scared to go home because she does not know what she is going to do at home.Stated that she is not strong enough to say "no" to drugs.She got a dangerous boy friend.Made Dr.Hernande aware of the situation.Patient stated that she is going to sit in the ED.Verbalized understanding of the discharge instructions.3 days meds and priscriptions given to patient.Patient walked out by staff.

## 2015-12-16 NOTE — Progress Notes (Signed)
Recreation Therapy Notes  Date: 05.19.17 Time: 1:00 pm Location: Craft Room  Group Topic: Communication, Problem solving, Teamwork  Goal Area(s) Addresses:  Patient will work in teams towards shared goal. Patient will verbalize skills needed to make activity successful. Patient will verbalize benefit of using skills identified to reach post d/c goals.  Behavioral Response: Did not attend  Intervention: Landing Pad  Activity: Patients put into groups and given 15 straws and approximately 2.5 feet of tape. Patient were instructed to build a contraption to catch and secure a golf ball that was dropped from approximately 4 feet.  Education: LRT educated group on healthy support systems.  Education Outcome: Patient did not attend group.  Clinical Observations/Feedback: Patient did not attend group.  Jacquelynn CreeGreene,Kelcey Wickstrom M, LRT/CTRS 12/16/2015 2:57 PM

## 2015-12-16 NOTE — ED Notes (Signed)

## 2015-12-16 NOTE — Progress Notes (Signed)
D: Observed pt in day room interacting with peers. Patient alert and oriented x4. Patient endorses passive SI, but verbally contracts for safety. Pt denies HI/AVH. Pt affect is blunted, irritable, and anxious. Pt indicated that if she were to be discharged, she would hurt herself. Pt irritated that doctor is reducing her medication. Pt redirectable. Pt indicated that she has not been attending group therapy, but endorsed that she will start because "it really helps me." Pt rated depression 10/10 and anxiety 10/10.  A: Offered active listening and support. Provided therapeutic communication. Administered scheduled medications. Encouraged pt attend groups and actively participate in care.  R: Pt cooperative. Pt medication compliant. Will continue Q15 min. checks. Safety maintained.

## 2015-12-17 DIAGNOSIS — F332 Major depressive disorder, recurrent severe without psychotic features: Secondary | ICD-10-CM

## 2015-12-17 MED ORDER — PRAZOSIN HCL 2 MG PO CAPS
ORAL_CAPSULE | ORAL | Status: AC
  Filled 2015-12-17: qty 1

## 2015-12-17 MED ORDER — GABAPENTIN 600 MG PO TABS: 600.0000 mg | ORAL_TABLET | Freq: Three times a day (TID) | ORAL | Status: DC

## 2015-12-17 MED ORDER — VENLAFAXINE HCL ER 75 MG PO CP24
150.0000 mg | ORAL_CAPSULE | Freq: Every day | ORAL | Status: DC
  Administered 2015-12-18 – 2015-12-19 (×2): 150 mg via ORAL
  Filled 2015-12-17 (×3): qty 2

## 2015-12-17 MED ORDER — GABAPENTIN 600 MG PO TABS
600.0000 mg | ORAL_TABLET | Freq: Three times a day (TID) | ORAL | Status: DC
  Administered 2015-12-17 – 2015-12-19 (×7): 600 mg via ORAL
  Filled 2015-12-17 (×7): qty 1

## 2015-12-17 MED ORDER — ACETAMINOPHEN 325 MG PO TABS
650.0000 mg | ORAL_TABLET | Freq: Once | ORAL | Status: AC
  Administered 2015-12-17: 650 mg via ORAL

## 2015-12-17 MED ORDER — OXYCODONE-ACETAMINOPHEN 5-325 MG PO TABS
ORAL_TABLET | ORAL | Status: AC
  Administered 2015-12-17: 1 via ORAL
  Filled 2015-12-17: qty 1

## 2015-12-17 MED ORDER — PRAZOSIN HCL 2 MG PO CAPS
2.0000 mg | ORAL_CAPSULE | Freq: Every day | ORAL | Status: DC
  Administered 2015-12-17 – 2015-12-18 (×2): 2 mg via ORAL
  Filled 2015-12-17: qty 1

## 2015-12-17 MED ORDER — MIRTAZAPINE 15 MG PO TABS
ORAL_TABLET | ORAL | Status: AC
  Administered 2015-12-17: 7.5 mg via ORAL
  Filled 2015-12-17: qty 1

## 2015-12-17 MED ORDER — ACETAMINOPHEN 325 MG PO TABS
ORAL_TABLET | ORAL | Status: AC
  Administered 2015-12-17: 650 mg via ORAL
  Filled 2015-12-17: qty 2

## 2015-12-17 MED ORDER — TRAZODONE HCL 100 MG PO TABS
100.0000 mg | ORAL_TABLET | Freq: Every evening | ORAL | Status: DC | PRN
  Administered 2015-12-17 – 2015-12-18 (×2): 100 mg via ORAL
  Filled 2015-12-17 (×2): qty 1

## 2015-12-17 MED ORDER — CYCLOBENZAPRINE HCL 10 MG PO TABS
10.0000 mg | ORAL_TABLET | Freq: Two times a day (BID) | ORAL | Status: DC | PRN
  Administered 2015-12-17 – 2015-12-19 (×4): 10 mg via ORAL
  Filled 2015-12-17 (×5): qty 1

## 2015-12-17 MED ORDER — OXYCODONE-ACETAMINOPHEN 5-325 MG PO TABS
1.0000 | ORAL_TABLET | Freq: Once | ORAL | Status: AC
  Administered 2015-12-17: 1 via ORAL

## 2015-12-17 MED ORDER — MIRTAZAPINE 15 MG PO TABS
7.5000 mg | ORAL_TABLET | Freq: Every day | ORAL | Status: DC
  Administered 2015-12-17 – 2015-12-18 (×2): 7.5 mg via ORAL
  Filled 2015-12-17: qty 1

## 2015-12-17 NOTE — ED Notes (Signed)
ED Doctor reordered muscle relaxer ( flexiril) for Patient.

## 2015-12-17 NOTE — ED Notes (Signed)
Pt cooperative and calm. Pt verbalizes SI and does not feel safe leaving the hospital. Denies HI and AVH. No concerns voiced. No distress noted. Maintained on 15 minute checks and observation by security camera for safety.

## 2015-12-17 NOTE — ED Notes (Signed)
Pt upset about pending discharge. Pt making suicidal and homicidal statements. Pt does not feel safe to leave hospital. Psychiatrist notified. Maintained on 15 minute checks and observation by security camera for safety.

## 2015-12-17 NOTE — ED Notes (Signed)
[facility] states that Patient's discharge is on hold per Dr. Clearnce SorrelFeheem.. [facility].

## 2015-12-17 NOTE — ED Notes (Signed)
Pt is alert and oriented this evening. Pt states that she feels safe at this time. However, she would not be able to deny suicidal ideation if forced to leave. Pt would like to be readmitted to [facility]. She acknowledges not participating during last admission due "major depression," but feels that she could be more active during her next visit. Writer provided food, drink and PRN medications as prescribed. Pt is calm and cooperative with staff, and 15 minute checks are ongoing for safety.

## 2015-12-17 NOTE — ED Notes (Signed)
Patient ask for remote, she is calm, Patient states that she still feels Si, no plan at this time, q 15 min. Checks and camera surveillance in progress.
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
## 2015-12-17 NOTE — ED Notes (Addendum)
Pt's home medications were sent to pharmacy. Copy of documentation sheet placed in pt's chart.

## 2015-12-17 NOTE — BH Assessment (Signed)
Writer talked with the patient and informed her, [facility] wasn't an option at this point. Also informed her, ER staff will work on her discharge papers and she will be discharge home.  Patient asked why and she didn't want to leave the ER. Writer acknowledged what she said and reiterated, she will be discharging.  Prior to walking into her room, the patient was standing at her door, calm and pleasant. During the time, writer was talking with her, she did not mention she was having SI/HI and AV/H. She stated she was upset because was discharging home and didn't understand why she could not stay.

## 2015-12-17 NOTE — ED Notes (Signed)
Pt will be discharged today. Maintained on 15 minute checks and observation by security camera for safety. 

## 2015-12-17 NOTE — ED Provider Notes (Signed)
-----------------------------------------   7:35 AM on 12/17/2015 -----------------------------------------   Blood pressure 97/73, pulse 74, temperature 98.5 F (36.9 C), temperature source Oral, resp. rate 20, height 5\' 8"  (1.727 m), weight 236 lb (107.049 kg), SpO2 98 %.  The patient had no acute events since last update.  Calm and cooperative at this time.  Disposition is pending per Psychiatry/Behavioral Medicine team recommendations.     Irean HongJade J Tanesia Butner, MD 12/17/15 [phone]

## 2015-12-17 NOTE — Discharge Instructions (Signed)
Major Depressive Disorder °Major depressive disorder is a mental illness. It also may be called clinical depression or unipolar depression. Major depressive disorder usually causes feelings of sadness, hopelessness, or helplessness. Some people with this disorder do not feel particularly sad but lose interest in doing things they used to enjoy (anhedonia). Major depressive disorder also can cause physical symptoms. It can interfere with work, school, relationships, and other normal everyday activities. The disorder varies in severity but is longer lasting and more serious than the sadness we all feel from time to time in our lives. °Major depressive disorder often is triggered by stressful life events or major life changes. Examples of these triggers include divorce, loss of your job or home, a move, and the death of a family member or close friend. Sometimes this disorder occurs for no obvious reason at all. People who have family members with major depressive disorder or bipolar disorder are at higher risk for developing this disorder, with or without life stressors. Major depressive disorder can occur at any age. It may occur just once in your life (single episode major depressive disorder). It may occur multiple times (recurrent major depressive disorder). °SYMPTOMS °People with major depressive disorder have either anhedonia or depressed mood on nearly a daily basis for at least 2 weeks or longer. Symptoms of depressed mood include: °· Feelings of sadness (blue or down in the dumps) or emptiness. °· Feelings of hopelessness or helplessness. °· Tearfulness or episodes of crying (may be observed by others). °· Irritability (children and adolescents). °In addition to depressed mood or anhedonia or both, people with this disorder have at least four of the following symptoms: °· Difficulty sleeping or sleeping too much.   °· Significant change (increase or decrease) in appetite or weight.   °· Lack of energy or  motivation. °· Feelings of guilt and worthlessness.   °· Difficulty concentrating, remembering, or making decisions. °· Unusually slow movement (psychomotor retardation) or restlessness (as observed by others).   °· Recurrent wishes for death, recurrent thoughts of self-harm (suicide), or a suicide attempt. °People with major depressive disorder commonly have persistent negative thoughts about themselves, other people, and the world. People with severe major depressive disorder may experience distorted beliefs or perceptions about the world (psychotic delusions). They also may see or hear things that are not real (psychotic hallucinations). °DIAGNOSIS °Major depressive disorder is diagnosed through an assessment by your health care provider. Your health care provider will ask about aspects of your daily life, such as mood, sleep, and appetite, to see if you have the diagnostic symptoms of major depressive disorder. Your health care provider may ask about your medical history and use of alcohol or drugs, including prescription medicines. Your health care provider also may do a physical exam and blood work. This is because certain medical conditions and the use of certain substances can cause major depressive disorder-like symptoms (secondary depression). Your health care provider also may refer you to a mental health specialist for further evaluation and treatment. °TREATMENT °It is important to recognize the symptoms of major depressive disorder and seek treatment. The following treatments can be prescribed for this disorder:   °· Medicine. Antidepressant medicines usually are prescribed. Antidepressant medicines are thought to correct chemical imbalances in the brain that are commonly associated with major depressive disorder. Other types of medicine may be added if the symptoms do not respond to antidepressant medicines alone or if psychotic delusions or hallucinations occur. °· Talk therapy. Talk therapy can be  helpful in treating major depressive disorder by providing   support, education, and guidance. Certain types of talk therapy also can help with negative thinking (cognitive behavioral therapy) and with relationship issues that trigger this disorder (interpersonal therapy). A mental health specialist can help determine which treatment is best for you. Most people with major depressive disorder do well with a combination of medicine and talk therapy. Treatments involving electrical stimulation of the brain can be used in situations with extremely severe symptoms or when medicine and talk therapy do not work over time. These treatments include electroconvulsive therapy, transcranial magnetic stimulation, and vagal nerve stimulation.   This information is not intended to replace advice given to you by your health care provider. Make sure you discuss any questions you have with your health care provider.   Document Released: 11/10/2012 Document Revised: 08/06/2014 Document Reviewed: 11/10/2012 Elsevier Interactive Patient Education [employer]2016 [employer]vier Inc.   Follow-up with [facility] return as needed.

## 2015-12-17 NOTE — ED Notes (Signed)
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 Patient teary eyed and frustrated, she states that she is afraid of what she might do if she leaves, and she wants to give up because she still can't see her children much at all because there father will not let her and she has tried to do better for her child, but is loosing hope, nurse talked to Patient about hope being a inside job and has to be bigger than our circumstances,nurse listened to patient and showed empathy and Nurse was able to have therapeutic conversation with Patient. Patient states that she needs her norco and flexirel, nurse let her know that she would talk with MD regarding orders. Patient states that [facility] and she would go to groups, but she had been there for 5 days and only went a couple of times because she did not want to get out bed because she was so depressed, she states she can start back going to outpatient treatment on Monday, but needs help to make it thru the weekend. Patient is safe and q 15 min checks being done and camera surveillance in progress.

## 2015-12-17 NOTE — ED Notes (Signed)
Patient's lunch served, Patient has been resting, no evidence of distress at this time, q 15 min checks and camera surveillance in progress.

## 2015-12-17 NOTE — ED Notes (Signed)
Pt finished using the phone [phone], stated "well at least SOMEBODY does care" when giving the phone back, pt was talking to family member

## 2015-12-17 NOTE — ED Notes (Signed)
Nurse notified Dr. Clearnce SorrelFeheem that Patient is having Si/and HI , she is agitated and states " No one cares" I cannot leave or I will hurt myself or someone else. Dr. Clearnce SorrelFeheem states that she will talk with ED Doctor and make decision.

## 2015-12-17 NOTE — ED Notes (Signed)
Patient complained of pelvis pain, hip pain, talked with ED Doctor, and medication ordered for pain x one dose, Patient talked about fractured pelvis and how she has had hard time healing and has been out of work until she gets better. Patient denies Hi at this time, states that she still is feeling SI and if she has go home she will hurt herself. q 15 min. Checks and camera surveillance in progress.

## 2015-12-18 MED ORDER — OXYCODONE-ACETAMINOPHEN 5-325 MG PO TABS
1.0000 | ORAL_TABLET | Freq: Once | ORAL | Status: AC
  Administered 2015-12-18: 1 via ORAL

## 2015-12-18 MED ORDER — OXYCODONE-ACETAMINOPHEN 5-325 MG PO TABS
ORAL_TABLET | ORAL | Status: AC
  Filled 2015-12-18: qty 1

## 2015-12-18 MED ORDER — ACETAMINOPHEN 500 MG PO TABS
1000.0000 mg | ORAL_TABLET | Freq: Once | ORAL | Status: AC
  Administered 2015-12-18: 1000 mg via ORAL

## 2015-12-18 MED ORDER — ACETAMINOPHEN 500 MG PO TABS
ORAL_TABLET | ORAL | Status: AC
  Administered 2015-12-18: 1000 mg via ORAL
  Filled 2015-12-18: qty 2

## 2015-12-18 NOTE — ED Notes (Signed)
Peanut butter, graham crackers and soda given

## 2015-12-18 NOTE — ED Notes (Signed)
Pt is awake in bed this evening watching TV. Pt mood is sad/anxious and she is c/o pelvic pain. Writer provided food and drink, discussed treatment plan administered medications as prescribed. Pt endorses passsive SI but contracts for safety as well. 15 minute checks are ongoing  for safety.

## 2015-12-18 NOTE — ED Notes (Signed)
Explained to pt that she will be staying overnight for observation and she is agreeable to the plan

## 2015-12-18 NOTE — ED Provider Notes (Signed)
-----------------------------------------   6:51 AM on 12/18/2015 -----------------------------------------   Blood pressure 90/60, pulse 64, temperature 97.5 F (36.4 C), temperature source Oral, resp. rate 18, height 5\' 8"  (1.727 m), weight 107.049 kg, SpO2 100 %.  The patient had no acute events since last update.  Calm and cooperative at this time.  Disposition is pending per Psychiatry/Behavioral Medicine team recommendations.     Loleta Roseory Wess Baney, MD 12/18/15 [phone]

## 2015-12-18 NOTE — ED Notes (Signed)
Pt continues to sleep ,resps easy in no distress

## 2015-12-18 NOTE — BH Assessment (Signed)
Writer spoke with the patient with to assess her current mental and emotional state. When asked if she was having any SI, she stated, "I always have that. That's like always for me." Writer then asked her, if that was her baseline, she stated, "yea, kind of sort of."  Patient denies HI and A/H  When asked about AV/H. She stated, "I been seeing shadows and that's the first." This started after she stop taking the medications that were prescribed when she "fractured my pelvic bone." Approximately a week ago.

## 2015-12-19 DIAGNOSIS — F603 Borderline personality disorder: Secondary | ICD-10-CM

## 2015-12-19 LAB — POCT PREGNANCY, URINE: PREG TEST UR: NEGATIVE

## 2015-12-19 MED ORDER — HYDROXYZINE PAMOATE 25 MG PO CAPS: 25.0000 mg | ORAL_CAPSULE | Freq: Three times a day (TID) | ORAL | Status: DC | PRN

## 2015-12-19 MED ORDER — MIRTAZAPINE 15 MG PO TABS: 15.0000 mg | ORAL_TABLET | Freq: Every day | ORAL | Status: DC

## 2015-12-19 NOTE — ED Notes (Signed)
Pt eating lunch in room. Pt will be discharged today. Maintained on 15 minute checks and observation by security camera for safety.
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
## 2015-12-19 NOTE — ED Notes (Signed)
Pt given prescriptions to take home. All belongings and medications stored in pharmacy will be returned to pt upon leaving unit. Pt currently dressing for discharge. Pt has plan to go to [facility] for follow up treatment. Maintained on 15 minute checks and observation by security camera for safety.

## 2015-12-19 NOTE — Consult Note (Signed)
[facility] Face-to-Face Psychiatry Consult   Reason for Consult:  Follow-up for this 40 year old woman with a history of mood disorder anxiety disorder and personality disorder who has been in the emergency room over the weekend. Referring Physician:  Sharma Covert Patient Identification: Sandy Mason MRN:  [id number] Principal Diagnosis: Borderline personality disorder Diagnosis:   Patient Active Problem List   Diagnosis Date Noted  . Severe episode of recurrent major depressive disorder, without psychotic features (HCC) [F33.2]   . Malingering [Z76.5] 12/16/2015  . Major depressive disorder, recurrent episode, moderate (HCC) [F33.1] 12/12/2015  . Closed pelvic fracture (HCC) [S32.9XXA] 10/31/2015  . Tobacco abuse [Z72.0] 10/31/2015  . Tobacco use disorder [F17.200] 03/28/2015  . PTSD (post-traumatic stress disorder) [F43.10] 03/28/2015  . Stimulant use disorder (cocaine) [F15.90] 03/28/2015  . Sedative, hypnotic or anxiolytic use disorder, severe, dependence (HCC) [F13.20] 03/27/2015  . Opioid use disorder, severe, dependence (HCC) [F11.20] 12/20/2014  . Alcohol use disorder severe. [F10.20] 12/18/2014  . Borderline personality disorder [F60.3] 12/18/2014    Total Time spent with patient: 45 minutes  Subjective:   Sandy Mason is a 40 y.o. female patient admitted with "I'm doing better".  HPI:  Patient interviewed. Chart reviewed. Vitals and labs in medicines reviewed. Patient says that her mood is better today. She repeats to me her anger at having been discharged from the hospital in a way that she thought was "too early". She says she was feeling too nervous and agitated at the time she left. After resting in the emergency room over the weekend her mood is improved. She denies any suicidal or homicidal ideation. Denies having any hallucinations. She does say she is still feeling anxious and also complains that she would like to be back on the full 15 mg of mirtazapine that she was taking  before. Patient however has no objections to being discharged today.  Social history: Still living with a friend and it sounds like that's the environment she is going back to. She feels fairly confident that it will be safe.  Medical history: Patient has a history of some the past also multiple substance abuse no other active major medical problems right now.  Substance abuse history: History of abuse of multiple substances opiates as well as stimulants in the past.  Past Psychiatric History: Patient has a history of mood instability with the diagnosis variously of bipolar disorder PTSD and borderline personality disorder. Has a tendency to become explosive at times. She has been relatively stable in the emergency room not aggressive or violent. She does have a past history of suicide attempts and aggression however.  Risk to Self: Suicidal Ideation: No Suicidal Intent: No Is patient at risk for suicide?: No Suicidal Plan?: No Access to Means: Yes Specify Access to Suicidal Means: Pt previously reported that she had access to ropes  What has been your use of drugs/alcohol within the last 12 months?: alcohol How many times?: 1 Other Self Harm Risks: None identified Triggers for Past Attempts: Unknown Intentional Self Injurious Behavior: None Risk to Others: Homicidal Ideation: No Thoughts of Harm to Others: No Current Homicidal Intent: No Current Homicidal Plan: No Access to Homicidal Means: No Identified Victim: None identified History of harm to others?: No Assessment of Violence: None Noted Violent Behavior Description: None identified Does patient have access to weapons?: No Criminal Charges Pending?: No Does patient have a court date: No Prior Inpatient Therapy: Prior Inpatient Therapy: Yes Prior Therapy Dates: 11/2015 Prior Therapy Facilty/Provider([facility]): [facility] Reason for Treatment:  PTSD Prior Outpatient Therapy: Prior Outpatient Therapy: No Prior Therapy Dates: N/A Prior  Therapy Facilty/Provider(s): N/A Reason for Treatment: N/A Does patient have an ACCT team?: No Does patient have Intensive In-House Services?  : No Does patient have [facility] services? : No Does patient have P4CC services?: No  Past Medical History:  Past Medical History  Diagnosis Date  . CVA (cerebral infarction)   . Seizures (HCC)   . Depression   . Anxiety   . Suicide attempt by hanging (HCC)   . Alcohol use disorder severe. 12/18/2014  . 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 Borderline personality disorder 12/18/2014  . Opioid use disorder, severe, dependence (HCC) 12/20/2014  . 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 PTSD (post-traumatic stress disorder) 03/28/2015   No past surgical history on file. Family History:  Family History  Problem Relation Age of Onset  . Diabetes Father    Family Psychiatric  History: Family history positive for mood instability Social History:  History  Alcohol Use  . 3.6 oz/week  . 6 Cans of beer per week    Comment: last used 6 months ago     History  Drug Use  . Yes  . Special: Benzodiazepines, Hydrocodone    Social History   Social History  . Marital Status: Single    Spouse Name: N/A  . Number of Children: N/A  . Years of Education: N/A   Social History Main Topics  . Smoking status: Current Every Day Smoker -- 1.00 packs/day    Types: Cigarettes  . Smokeless tobacco: Not on file  . Alcohol Use: 3.6 oz/week    6 Cans of beer per week     Comment: last used 6 months ago  . Drug Use: Yes    Special: Benzodiazepines, Hydrocodone  . Sexual Activity: Yes    Birth Control/ Protection: IUD   Other Topics Concern  . Not on file   Social History Narrative   Additional Social History:    Allergies:   Allergies  Allergen Reactions  . Depakote [Valproic Acid] Other (See Comments)    Reaction:  Seizures   . Haldol [Haloperidol  Lactate] Other (See Comments)    Reaction:  Seizures   . Keflet [Cephalexin] Rash    Labs: No results found for this or any previous visit (from the past 48 hour(s)).  No current facility-administered medications for this encounter.   Current Outpatient Prescriptions  Medication Sig Dispense Refill  . cyclobenzaprine (FLEXERIL) 5 MG tablet Take 1 tablet (5 mg total) by mouth 3 (three) times daily as needed for muscle spasms. 15 tablet 0  . gabapentin (NEURONTIN) 300 MG capsule Take 3 capsules (900 mg total) by mouth 3 (three) times daily. 120 capsule 0  . hydrOXYzine (VISTARIL) 25 MG capsule Take 1 capsule (25 mg total) by mouth 3 (three) times daily as needed. 30 capsule 0  . lidocaine (LIDODERM) 5 % Place 2 patches onto the skin daily. Remove & Discard patch within 12 hours or as directed by MD 60 patch 0  . meloxicam (MOBIC) 7.5 MG tablet Take 1 tablet (7.5 mg total) by mouth 2 (two) times daily with a meal. 60 tablet 0  . mirtazapine (REMERON) 15 MG tablet Take 1 tablet (15 mg total) by mouth at bedtime. 30 tablet 0  . nitrofurantoin, macrocrystal-monohydrate, (MACROBID) 100 MG capsule Take 1 capsule (100 mg total) by mouth every 12 (twelve) hours. 3 capsule 0  .  prazosin (MINIPRESS) 2 MG capsule Take 1 capsule (2 mg total) by mouth at bedtime. 30 capsule 0  . venlafaxine XR (EFFEXOR-XR) 150 MG 24 hr capsule Take 2 capsules (300 mg total) by mouth daily with breakfast. 30 capsule 0  . Vitamin D, Ergocalciferol, (DRISDOL) 50000 units CAPS capsule Take 50,000 Units by mouth once a week.      Musculoskeletal: Strength & Muscle Tone: within normal limits Gait & Station: normal Patient leans: N/A  Psychiatric Specialty Exam: Physical Exam  Nursing note and vitals reviewed. Constitutional: She appears well-developed and well-nourished.  HENT:  Head: Normocephalic and atraumatic.  Eyes: Conjunctivae are normal. Pupils are equal, round, and reactive to light.  Neck: Normal range of  motion.  Cardiovascular: Normal rate and normal heart sounds.   Respiratory: Effort normal. No respiratory distress.  GI: Soft.  Musculoskeletal: Normal range of motion.  Neurological: She is alert.  Skin: Skin is warm and dry.  Psychiatric: She has a normal mood and affect. Her behavior is normal. Judgment and thought content normal.    Review of Systems  Constitutional: Negative.   HENT: Negative.   Eyes: Negative.   Respiratory: Negative.   Cardiovascular: Negative.   Gastrointestinal: Negative.   Musculoskeletal: Negative.   Skin: Negative.   Neurological: Negative.   Psychiatric/Behavioral: Negative for depression, suicidal ideas, hallucinations, memory loss and substance abuse. The patient is nervous/anxious and has insomnia.     Blood pressure 105/62, pulse 87, temperature 98 F (36.7 C), temperature source Oral, resp. rate 16, height 5\' 8"  (1.727 m), weight 107.049 kg (236 lb), SpO2 96 %.Body mass index is 35.89 kg/(m^2).  General Appearance: Casual  Eye Contact:  Fair  Speech:  Normal Rate  Volume:  Decreased  Mood:  Anxious  Affect:  Constricted  Thought Process:  Goal Directed  Orientation:  Full (Time, Place, and Person)  Thought Content:  Negative  Suicidal Thoughts:  No  Homicidal Thoughts:  No  Memory:  Immediate;   Fair Recent;   Fair Remote;   Fair  Judgement:  Fair  Insight:  Fair  Psychomotor Activity:  Normal  Concentration:  Concentration: Good  Recall:  Fair  Fund of Knowledge:  Fair  Language:  Fair  Akathisia:  No  Handed:  Right  AIMS (if indicated):     Assets:  Desire for Improvement Housing Physical Health  ADL's:  Intact  Cognition:  WNL  Sleep:        Treatment Plan Summary: Medication management and Plan  continue IVC and patient can be released from the emergency room. Case reviewed with the ER physician.  Disposition: Medication management and Plan I agreed to give her a prescription for the mirtazapine 15 mg at night 30 day  supply as well as for some hydroxyzine when necessary for anxiety. Patient can be released from the emergency room at this point. Supportive counseling completed. Encouraged to continue following up with her outpatient provider.  Mordecai Rasmussen, MD 12/19/2015 9:12 PM

## 2015-12-19 NOTE — Consult Note (Signed)
  Psychiatry: Brief note. Full note follow. Patient is not currently dangerous or aggressive and agrees to outpatient treatment. She can be released from the emergency room and will follow-up with her local provider.

## 2015-12-19 NOTE — ED Provider Notes (Signed)
-----------------------------------------   8:22 AM on 12/19/2015 -----------------------------------------   Blood pressure 105/62, pulse 87, temperature 98 F (36.7 C), temperature source Oral, resp. rate 16, height 5\' 8"  (1.727 m), weight 236 lb (107.049 kg), SpO2 96 %.  The patient had no acute events since last update.  Calm and cooperative at this time.    Patient to be reevaluated by psych this morning     Rebecka ApleyAllison P Terrianne Cavness, MD 12/19/15 [phone]

## 2015-12-19 NOTE — ED Notes (Signed)
Patient asleep in room. No noted distress or abnormal behavior. Will continue 15 minute checks and observation by security cameras for safety. 

## 2015-12-25 ENCOUNTER — Encounter: Payer: Self-pay | Admitting: Emergency Medicine

## 2015-12-25 ENCOUNTER — Emergency Department
Admission: EM | Admit: 2015-12-25 | Discharge: 2016-01-09 | Disposition: A | Discharge status: Home / Self Care | Payer: Self-pay | Attending: Emergency Medicine | Admitting: Emergency Medicine

## 2015-12-25 DIAGNOSIS — R569 Unspecified convulsions: Secondary | ICD-10-CM | POA: Insufficient documentation

## 2015-12-25 DIAGNOSIS — Z79899 Other long term (current) drug therapy: Secondary | ICD-10-CM | POA: Insufficient documentation

## 2015-12-25 DIAGNOSIS — F10220 Alcohol dependence with intoxication, uncomplicated: Secondary | ICD-10-CM | POA: Insufficient documentation

## 2015-12-25 DIAGNOSIS — F10920 Alcohol use, unspecified with intoxication, uncomplicated: Secondary | ICD-10-CM

## 2015-12-25 DIAGNOSIS — F1120 Opioid dependence, uncomplicated: Secondary | ICD-10-CM | POA: Insufficient documentation

## 2015-12-25 DIAGNOSIS — F339 Major depressive disorder, recurrent, unspecified: Secondary | ICD-10-CM | POA: Insufficient documentation

## 2015-12-25 DIAGNOSIS — F331 Major depressive disorder, recurrent, moderate: Secondary | ICD-10-CM | POA: Diagnosis present

## 2015-12-25 DIAGNOSIS — F1320 Sedative, hypnotic or anxiolytic dependence, uncomplicated: Secondary | ICD-10-CM | POA: Insufficient documentation

## 2015-12-25 DIAGNOSIS — R45851 Suicidal ideations: Secondary | ICD-10-CM | POA: Insufficient documentation

## 2015-12-25 DIAGNOSIS — F332 Major depressive disorder, recurrent severe without psychotic features: Secondary | ICD-10-CM | POA: Insufficient documentation

## 2015-12-25 DIAGNOSIS — Z888 Allergy status to other drugs, medicaments and biological substances status: Secondary | ICD-10-CM | POA: Insufficient documentation

## 2015-12-25 DIAGNOSIS — F603 Borderline personality disorder: Secondary | ICD-10-CM | POA: Insufficient documentation

## 2015-12-25 DIAGNOSIS — F4310 Post-traumatic stress disorder, unspecified: Secondary | ICD-10-CM | POA: Insufficient documentation

## 2015-12-25 DIAGNOSIS — F17210 Nicotine dependence, cigarettes, uncomplicated: Secondary | ICD-10-CM | POA: Insufficient documentation

## 2015-12-25 DIAGNOSIS — Z8673 Personal history of transient ischemic attack (TIA), and cerebral infarction without residual deficits: Secondary | ICD-10-CM | POA: Insufficient documentation

## 2015-12-25 DIAGNOSIS — Z765 Malingerer [conscious simulation]: Secondary | ICD-10-CM | POA: Insufficient documentation

## 2015-12-25 LAB — CBC WITH DIFFERENTIAL/PLATELET
Band Neutrophils: 0 %
Basophils Absolute: 0 K/uL (ref 0–0.1)
Basophils Relative: 0 %
Blasts: 0 %
Eosinophils Absolute: 0.2 K/uL (ref 0–0.7)
Eosinophils Relative: 2 %
HCT: 44 % (ref 35.0–47.0)
Hemoglobin: 14.9 g/dL (ref 12.0–16.0)
Lymphocytes Relative: 60 %
Lymphs Abs: 5.4 K/uL — ABNORMAL HIGH (ref 1.0–3.6)
MCH: 31.7 pg (ref 26.0–34.0)
MCHC: 33.8 g/dL (ref 32.0–36.0)
MCV: 93.8 fL (ref 80.0–100.0)
Metamyelocytes Relative: 0 %
Monocytes Absolute: 0.8 K/uL (ref 0.2–0.9)
Monocytes Relative: 9 %
Myelocytes: 0 %
Neutro Abs: 2.6 K/uL (ref 1.4–6.5)
Neutrophils Relative %: 29 %
Other: 0 %
Platelets: 264 K/uL (ref 150–440)
Promyelocytes Absolute: 0 %
RBC: 4.69 MIL/uL (ref 3.80–5.20)
RDW: 12.7 % (ref 11.5–14.5)
WBC: 9 K/uL (ref 3.6–11.0)
nRBC: 0 /100{WBCs}

## 2015-12-25 LAB — URINE DRUG SCREEN, QUALITATIVE (ARMC ONLY)
AMPHETAMINES, UR SCREEN: NOT DETECTED
BENZODIAZEPINE, UR SCRN: NOT DETECTED
Barbiturates, Ur Screen: NOT DETECTED
CANNABINOID 50 NG, UR ~~LOC~~: NOT DETECTED
Cocaine Metabolite,Ur ~~LOC~~: NOT DETECTED
MDMA (Ecstasy)Ur Screen: NOT DETECTED
Methadone Scn, Ur: NOT DETECTED
Opiate, Ur Screen: NOT DETECTED
PHENCYCLIDINE (PCP) UR S: NOT DETECTED
TRICYCLIC, UR SCREEN: NOT DETECTED

## 2015-12-25 LAB — SALICYLATE LEVEL: Salicylate Lvl: <4 mg/dL (ref 2.8–30.0)

## 2015-12-25 LAB — COMPREHENSIVE METABOLIC PANEL WITH GFR
ALT: 53 U/L (ref 14–54)
AST: 45 U/L — ABNORMAL HIGH (ref 15–41)
Albumin: 4.3 g/dL (ref 3.5–5.0)
Alkaline Phosphatase: 75 U/L (ref 38–126)
Anion gap: 14 (ref 5–15)
BUN: <5 mg/dL — ABNORMAL LOW (ref 6–20)
CO2: 20 mmol/L — ABNORMAL LOW (ref 22–32)
Calcium: 9 mg/dL (ref 8.9–10.3)
Chloride: 101 mmol/L (ref 101–111)
Creatinine, Ser: 0.66 mg/dL (ref 0.44–1.00)
GFR calc Af Amer: >60 mL/min
GFR calc non Af Amer: >60 mL/min
Glucose, Bld: 109 mg/dL — ABNORMAL HIGH (ref 65–99)
Potassium: 3.2 mmol/L — ABNORMAL LOW (ref 3.5–5.1)
Sodium: 135 mmol/L (ref 135–145)
Total Bilirubin: 0.6 mg/dL (ref 0.3–1.2)
Total Protein: 8.5 g/dL — ABNORMAL HIGH (ref 6.5–8.1)

## 2015-12-25 LAB — URINALYSIS COMPLETE WITH MICROSCOPIC (ARMC ONLY)
Bilirubin Urine: NEGATIVE
Glucose, UA: NEGATIVE mg/dL
KETONES UR: NEGATIVE mg/dL
LEUKOCYTES UA: NEGATIVE
Nitrite: NEGATIVE
PH: 6 (ref 5.0–8.0)
PROTEIN: 30 mg/dL — AB
SPECIFIC GRAVITY, URINE: 1.002 — AB (ref 1.005–1.030)

## 2015-12-25 LAB — POCT PREGNANCY, URINE: PREG TEST UR: NEGATIVE

## 2015-12-25 LAB — ACETAMINOPHEN LEVEL

## 2015-12-25 LAB — ETHANOL: Alcohol, Ethyl (B): 244 mg/dL — ABNORMAL HIGH

## 2015-12-25 MED ORDER — ONDANSETRON HCL 4 MG PO TABS
ORAL_TABLET | ORAL | Status: AC
  Filled 2015-12-25: qty 1

## 2015-12-25 MED ORDER — ACETAMINOPHEN 500 MG PO TABS
1000.0000 mg | ORAL_TABLET | Freq: Once | ORAL | Status: AC
  Administered 2015-12-25: 1000 mg via ORAL
  Filled 2015-12-25: qty 2

## 2015-12-25 MED ORDER — GABAPENTIN 300 MG PO CAPS
900.0000 mg | ORAL_CAPSULE | Freq: Three times a day (TID) | ORAL | Status: DC
  Administered 2015-12-25 – 2015-12-26 (×4): 900 mg via ORAL
  Filled 2015-12-25 (×4): qty 3

## 2015-12-25 MED ORDER — ONDANSETRON 4 MG PO TBDP
ORAL_TABLET | ORAL | Status: AC
  Filled 2015-12-25: qty 1

## 2015-12-25 MED ORDER — PRAZOSIN HCL 2 MG PO CAPS
2.0000 mg | ORAL_CAPSULE | Freq: Every day | ORAL | Status: DC
  Administered 2015-12-25 – 2016-01-08 (×15): 2 mg via ORAL
  Filled 2015-12-25 (×9): qty 1

## 2015-12-25 MED ORDER — MELOXICAM 7.5 MG PO TABS
7.5000 mg | ORAL_TABLET | Freq: Two times a day (BID) | ORAL | Status: DC
  Administered 2015-12-25 – 2016-01-09 (×31): 7.5 mg via ORAL
  Filled 2015-12-25 (×19): qty 1

## 2015-12-25 MED ORDER — LORAZEPAM 2 MG PO TABS
0.0000 mg | ORAL_TABLET | Freq: Four times a day (QID) | ORAL | Status: DC
  Administered 2015-12-25 – 2015-12-26 (×2): 1 mg via ORAL

## 2015-12-25 MED ORDER — CYCLOBENZAPRINE HCL 10 MG PO TABS
5.0000 mg | ORAL_TABLET | Freq: Three times a day (TID) | ORAL | Status: DC | PRN
  Administered 2015-12-25 – 2016-01-07 (×19): 5 mg via ORAL
  Filled 2015-12-25 (×4): qty 1
  Filled 2015-12-25: qty 2
  Filled 2015-12-25 (×6): qty 1

## 2015-12-25 MED ORDER — LORAZEPAM 1 MG PO TABS
ORAL_TABLET | ORAL | Status: AC
  Administered 2015-12-25: 1 mg via ORAL
  Filled 2015-12-25: qty 1

## 2015-12-25 MED ORDER — MIRTAZAPINE 15 MG PO TABS
15.0000 mg | ORAL_TABLET | Freq: Every day | ORAL | Status: DC
  Administered 2015-12-25: 15 mg via ORAL
  Filled 2015-12-25: qty 1

## 2015-12-25 MED ORDER — ONDANSETRON 8 MG PO TBDP
4.0000 mg | ORAL_TABLET | Freq: Once | ORAL | Status: AC
  Administered 2015-12-25: 4 mg via ORAL
  Filled 2015-12-25: qty 0.5

## 2015-12-25 MED ORDER — LIDOCAINE 5 % EX PTCH
2.0000 | MEDICATED_PATCH | CUTANEOUS | Status: DC
  Administered 2015-12-25 – 2016-01-01 (×7): 2 via TRANSDERMAL
  Filled 2015-12-25 (×19): qty 2

## 2015-12-25 MED ORDER — LORAZEPAM 2 MG PO TABS: 0.0000 mg | ORAL_TABLET | Freq: Two times a day (BID) | ORAL | Status: DC

## 2015-12-25 MED ORDER — HYDROXYZINE HCL 25 MG PO TABS
25.0000 mg | ORAL_TABLET | ORAL | Status: AC
  Administered 2015-12-25: 25 mg via ORAL
  Filled 2015-12-25: qty 1

## 2015-12-25 MED ORDER — HYDROXYZINE PAMOATE 25 MG PO CAPS
25.0000 mg | ORAL_CAPSULE | Freq: Every day | ORAL | Status: DC
  Administered 2015-12-25: 25 mg via ORAL
  Filled 2015-12-25 (×3): qty 1

## 2015-12-25 MED ORDER — VENLAFAXINE HCL ER 75 MG PO CP24
300.0000 mg | ORAL_CAPSULE | Freq: Every day | ORAL | Status: DC
  Administered 2015-12-25 – 2015-12-26 (×2): 300 mg via ORAL
  Filled 2015-12-25: qty 4
  Filled 2015-12-25 (×2): qty 2

## 2015-12-25 MED ORDER — LORAZEPAM 2 MG PO TABS: 0.0000 mg | ORAL_TABLET | Freq: Four times a day (QID) | ORAL | Status: DC

## 2015-12-25 NOTE — ED Notes (Signed)
Pt transferred from main ed ,in no distress awaiting disposistion, pt c/o nausea will inform DR
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
## 2015-12-25 NOTE — Progress Notes (Signed)
Pt SOC conducted by Dr. Garnetta BuddyFaheem has recommended Inpatient Psychiatric Treatment.     12/25/2015 Cheryl FlashNicole Ruthel Martine, MS, NCC, LPCA Therapeutic Triage Specialist

## 2015-12-25 NOTE — ED Provider Notes (Signed)
-----------------------------------------   7:16 AM on 12/25/2015 -----------------------------------------   Blood pressure 130/85, pulse 125, temperature 98.2 F (36.8 C), temperature source Oral, resp. rate 24, height 5\' 8"  (1.727 m), weight 236 lb (107.049 kg), SpO2 96 %. Will watch pulse.  The patient had no acute events since last update.  Calm and cooperative at this time.  Disposition is pending per Psychiatry/Behavioral Medicine team recommendations.     Arnaldo NatalPaul F Malinda, MD 12/25/15 [phone]

## 2015-12-25 NOTE — ED Notes (Signed)
IVC, psych pending, [facility]

## 2015-12-25 NOTE — ED Provider Notes (Signed)
[facility] Emergency Department Provider Note  ____________________________________________  Time seen: Approximately 5:06 AM  I have reviewed the triage vital signs and the nursing notes.   HISTORY  Chief Complaint Suicidal    HPI Sandy Mason is a 40 y.o. female with an extensive psychiatric and substance abuse history presents by EMS for wanting to die, jumping in front of a vehicle, and being intoxicated.  She reports that her depression and suicidality has been a gradual onset over a long period of time and is severe.  Nothing is making them better and the people with whom she lives are making it worse because they make her feel like she is worthless.She has not been taking her nightly Vistaril and mirtazapine as prescribed recently by Dr. Toni Amendlapacs.  She says that she has had visual and auditory hallucinations in the past but it is unclear if she is still experiencing them tonight.  She is intoxicated upon arrival and admits to drinking multiple shots of liquor as well as two "oxys".  She actively wants to die at this point and says she would kill herself if given the opportunity.  She reports severe pain but is unclear what exactly hurts.  She does have a history of a pelvic fracture recently but she is ambulatory and even states herself that she was walking down the road trying to flag down vehicles before she jumped in front of one.   Past Medical History  Diagnosis Date  . CVA (cerebral infarction)   . Seizures (HCC)   . Depression   . Anxiety   . Suicide attempt by hanging (HCC)   . Alcohol use disorder severe. 12/18/2014  . 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 Borderline personality disorder 12/18/2014  . Opioid use disorder, severe, dependence (HCC) 12/20/2014  . 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 PTSD (post-traumatic stress disorder) 03/28/2015     Patient Active Problem List   Diagnosis Date Noted  . Severe episode of recurrent major depressive disorder, without psychotic features (HCC)   . Malingering 12/16/2015  . Major depressive disorder, recurrent episode, moderate (HCC) 12/12/2015  . Closed pelvic fracture (HCC) 10/31/2015  . Tobacco abuse 10/31/2015  . Tobacco use disorder 03/28/2015  . PTSD (post-traumatic stress disorder) 03/28/2015  . Stimulant use disorder (cocaine) 03/28/2015  . 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 Opioid use disorder, severe, dependence (HCC) 12/20/2014  . Alcohol use disorder severe. 12/18/2014  . Borderline personality disorder 12/18/2014    History reviewed. No pertinent past surgical history.  Current Outpatient Rx  Name  Route  Sig  Dispense  Refill  . cyclobenzaprine (FLEXERIL) 5 MG tablet   Oral   Take 1 tablet (5 mg total) by mouth 3 (three) times daily as needed for muscle spasms.   15 tablet   0   . gabapentin (NEURONTIN) 300 MG capsule   Oral   Take 3 capsules (900 mg total) by mouth 3 (three) times daily.   120 capsule   0   . hydrOXYzine (VISTARIL) 25 MG capsule   Oral   Take 1 capsule (25 mg total) by mouth 3 (three) times daily as needed.   30 capsule   0   . lidocaine (LIDODERM) 5 %   Transdermal   Place 2 patches onto the skin daily. Remove & Discard patch within 12 hours or as directed by MD  60 patch   0   . meloxicam (MOBIC) 7.5 MG tablet   Oral   Take 1 tablet (7.5 mg total) by mouth 2 (two) times daily with a meal.   60 tablet   0   . mirtazapine (REMERON) 15 MG tablet   Oral   Take 1 tablet (15 mg total) by mouth at bedtime.   30 tablet   0   . nitrofurantoin, macrocrystal-monohydrate, (MACROBID) 100 MG capsule   Oral   Take 1 capsule (100 mg total) by mouth every 12 (twelve) hours.   3 capsule   0   . prazosin (MINIPRESS) 2 MG capsule   Oral   Take 1 capsule (2 mg total) by mouth at  bedtime.   30 capsule   0   . venlafaxine XR (EFFEXOR-XR) 150 MG 24 hr capsule   Oral   Take 2 capsules (300 mg total) by mouth daily with breakfast.   30 capsule   0   . Vitamin D, Ergocalciferol, (DRISDOL) 50000 units CAPS capsule   Oral   Take 50,000 Units by mouth once a week.           Allergies Depakote; Haldol; and Keflet  Family History  Problem Relation Age of Onset  . Diabetes Father     Social History Social History  Substance Use Topics  . Smoking status: Current Every Day Smoker -- 1.00 packs/day    Types: Cigarettes  . Smokeless tobacco: None  . Alcohol Use: 3.6 oz/week    6 Cans of beer per week     Comment: tonight 6     Review of Systems Constitutional: No fever/chills Eyes: No visual changes. ENT: No sore throat. Cardiovascular: Denies chest pain. Respiratory: Denies shortness of breath. Gastrointestinal: No abdominal pain.  No nausea, no vomiting.  No diarrhea.  No constipation. Genitourinary: Negative for dysuria. Musculoskeletal: Negative for back pain.  Chronic pelvic (bone) pain s/p fracture Skin: Negative for rash. Neurological: Negative for headaches, focal weakness or numbness. Psych:  +SI, intoxicated  10-point ROS otherwise negative.  ____________________________________________   PHYSICAL EXAM:  VITAL SIGNS: ED Triage Vitals  Enc Vitals Group     BP 12/25/15 0458 130/85 mmHg     Pulse Rate 12/25/15 0458 125     Resp 12/25/15 0458 24     Temp 12/25/15 0458 98.2 F (36.8 C)     Temp Source 12/25/15 0458 Oral     SpO2 12/25/15 0458 96 %     Weight 12/25/15 0458 236 lb (107.049 kg)     Height 12/25/15 0458 5\' 8"  (1.727 m)     Head Cir --      Peak Flow --      Pain Score 12/25/15 0459 10     Pain Loc --      Pain Edu? --      Excl. in GC? --     Constitutional: Alert and oriented but obvious intoxicated.  Sobbing. Eyes: Conjunctivae are normal. PERRL. EOMI. Head: Atraumatic. Nose: No  congestion/rhinnorhea. Mouth/Throat: Mucous membranes are moist.  Oropharynx non-erythematous. Neck: No stridor.  No meningeal signs.   Cardiovascular: Initially tachycardiac, now normal rate, regular rhythm. Good peripheral circulation. Grossly normal heart sounds.   Respiratory: Normal respiratory effort.  No retractions. Lungs CTAB. Gastrointestinal: Soft and nontender. No distention.  Musculoskeletal: No lower extremity tenderness nor edema. No gross deformities of extremities. Neurologic:  Slurred speech. No gross focal neurologic deficits are appreciated.  Skin:  Skin is warm,  dry and intact. No rash noted. Psychiatric: Mood and affect are depressed.  +SI  ____________________________________________   LABS (all labs ordered are listed, but only abnormal results are displayed)  Labs Reviewed  CBC WITH DIFFERENTIAL/PLATELET - Abnormal; Notable for the following:    Lymphs Abs 5.4 (*)    All other components within normal limits  URINALYSIS COMPLETEWITH MICROSCOPIC ([facility] ONLY) - Abnormal; Notable for the following:    Color, Urine STRAW (*)    APPearance CLEAR (*)    Specific Gravity, Urine 1.002 (*)    Hgb urine dipstick 2+ (*)    Protein, ur 30 (*)    Bacteria, UA RARE (*)    Squamous Epithelial / LPF 0-5 (*)    All other components within normal limits  COMPREHENSIVE METABOLIC PANEL - Abnormal; Notable for the following:    Potassium 3.2 (*)    CO2 20 (*)    Glucose, Bld 109 (*)    BUN <5 (*)    Total Protein 8.5 (*)    AST 45 (*)    All other components within normal limits  ACETAMINOPHEN LEVEL - Abnormal; Notable for the following:    Acetaminophen (Tylenol), Serum <10 (*)    All other components within normal limits  ETHANOL - Abnormal; Notable for the following:    Alcohol, Ethyl (B) 244 (*)    All other components within normal limits  SALICYLATE LEVEL  URINE DRUG SCREEN, QUALITATIVE ([facility] ONLY)  POC URINE PREG, ED  POCT PREGNANCY, URINE    ____________________________________________  EKG  None ____________________________________________  RADIOLOGY   No results found.  ____________________________________________   PROCEDURES  Procedure(s) performed: None  Critical Care performed: No ____________________________________________   INITIAL IMPRESSION / ASSESSMENT AND PLAN / ED COURSE  Pertinent labs & imaging results that were available during my care of the patient were reviewed by me and considered in my medical decision making (see chart for details).  Well-known to ED, in fact in the recent past the patient hung herself in the behavioral quad in the emergency department and turned blue before she could be cut down.  She is at higher risk in spite of her multiple recent evaluations by psychiatry.  She is currently intoxicated.  I am giving her prescribed dose of Vistaril but I will decline to give her the narcotics she is requesting.  She has no acute medical conditions at this time and I have placed her under involuntary commitment for psychiatric evaluation.   ____________________________________________  FINAL CLINICAL IMPRESSION(S) / ED DIAGNOSES  Final diagnoses:  Severe episode of recurrent major depressive disorder, without psychotic features (HCC)  Alcohol intoxication, uncomplicated (HCC)  Suicidal ideation     MEDICATIONS GIVEN DURING THIS VISIT:  Medications  hydrOXYzine (ATARAX/VISTARIL) tablet 25 mg (not administered)  acetaminophen (TYLENOL) tablet 1,000 mg (not administered)     NEW OUTPATIENT MEDICATIONS STARTED DURING THIS VISIT:  New Prescriptions   No medications on file      Note:  This document was prepared using Dragon voice recognition software and may include unintentional dictation errors.   Loleta Rose, MD 12/25/15 [phone]

## 2015-12-25 NOTE — ED Notes (Signed)
Pt is aware she is waiting for psych eval

## 2015-12-25 NOTE — ED Notes (Signed)
Pt not to be placed in medical room; pt, at this facility, did attempt to strangle self with cords from the phillips monitor  Info shared with Judeth CornfieldStephanie, [place]

## 2015-12-25 NOTE — ED Notes (Signed)
IVC, psych pending, move to [facility] 

## 2015-12-25 NOTE — ED Notes (Signed)
Pt now being seen by telepsych

## 2015-12-25 NOTE — BH Assessment (Signed)
Assessment Note  Sandy Mason is an 40 y.o. female presenting to the ED via EMS for intoxication and wanting to die by jumping in front of a vehicle. She reports she has not been taking her nightly Vistaril and mirtazapine as prescribed recently by Dr. Toni Amend. She says that she has had visual and auditory hallucinations in the past but thinks that her muscle relaxers are causing her to have hallucinations again. She states the hallucinations are the reason why she did not participate in groups while at [facility] behavioral medicine unit.    Patient arrives intoxicated and admits to drinking multiple shots of liquor as well as two "oxys". She endorses suicidal thoughts and says she would kill herself if given the opportunity.   Diagnosis: Major Depressive Disorder/Suicidal  Past Medical History:  Past Medical History  Diagnosis Date  . CVA (cerebral infarction)   . Seizures (HCC)   . Depression   . Anxiety   . Suicide attempt by hanging (HCC)   . Alcohol use disorder severe. 12/18/2014  . 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 Borderline personality disorder 12/18/2014  . Opioid use disorder, severe, dependence (HCC) 12/20/2014  . 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 PTSD (post-traumatic stress disorder) 03/28/2015    History reviewed. No pertinent past surgical history.  Family History:  Family History  Problem Relation Age of Onset  . Diabetes Father     Social History:  reports that she has been smoking Cigarettes.  She has been smoking about 1.00 pack per day. She does not have any smokeless tobacco history on file. She reports that she drinks about 3.6 oz of alcohol per week. She reports that she uses illicit drugs (Benzodiazepines and Hydrocodone).  Additional Social History:  Alcohol / Drug Use History of alcohol / drug use?:  (Pt has a history of alcohol abuse)  CIWA:  CIWA-Ar BP: 130/85 mmHg Pulse Rate: (!) 125 COWS:    Allergies:  Allergies  Allergen Reactions  . Depakote [Valproic Acid] Other (See Comments)    Reaction:  Seizures   . Haldol [Haloperidol Lactate] Other (See Comments)    Reaction:  Seizures   . Keflet [Cephalexin] Rash    Home Medications:  (Not in a hospital admission)  OB/GYN Status:  No LMP recorded. Patient is not currently having periods (Reason: IUD).  General Assessment Data Location of Assessment: [facility] ED TTS Assessment: In system Is this a Tele or Face-to-Face Assessment?: Face-to-Face Is this an Initial Assessment or a Re-assessment for this encounter?: Initial Assessment Marital status: Single Maiden name: N/A Is patient pregnant?: No Pregnancy Status: No Living Arrangements: Non-relatives/Friends Can pt return to current living arrangement?: Yes Admission Status: Involuntary Is patient capable of signing voluntary admission?: Yes Referral Source: Self/Family/Friend Insurance type: None  Medical Screening Exam [facility] Walk-in ONLY) Medical Exam completed: Yes  Crisis Care Plan Living Arrangements: Non-relatives/Friends Legal Guardian: Other: (self) Name of Psychiatrist: Dr. Suzie Portela Name of Therapist: [facility]  Education Status Is patient currently in school?: No Current Grade: N/A Highest grade of school patient has completed: N/A Name of school: N/A Contact person: N/A  Risk to self with the past 6 months Suicidal Ideation: Yes-Currently Present Has patient been a risk to self within the past 6 months prior to admission? : Yes Suicidal Intent: Yes-Currently Present Has patient had any suicidal intent within the past 6 months prior to admission? : Yes Is patient at risk for suicide?: Yes  Suicidal Plan?: Yes-Currently Present Has patient had any suicidal plan within the past 6 months prior to admission? : Yes Specify Current Suicidal Plan: Pt reports she walked out in front of a car Access to Means:  Yes Specify Access to Suicidal Means: access to traffic What has been your use of drugs/alcohol within the last 12 months?: alcohol Previous Attempts/Gestures: Yes How many times?: 2 Other Self Harm Risks: None identified Triggers for Past Attempts: Unknown Intentional Self Injurious Behavior: None Family Suicide History: No Recent stressful life event(s): Financial Problems, Conflict (Comment), Other (Comment) (lacks stable housing) Persecutory voices/beliefs?: No Depression: Yes Depression Symptoms: Loss of interest in usual pleasures, Feeling worthless/self pity Substance abuse history and/or treatment for substance abuse?: Yes Suicide prevention information given to non-admitted patients: Not applicable  Risk to Others within the past 6 months Homicidal Ideation: No Does patient have any lifetime risk of violence toward others beyond the six months prior to admission? : No Thoughts of Harm to Others: No Current Homicidal Intent: No Current Homicidal Plan: No Access to Homicidal Means: No Identified Victim: None identified History of harm to others?: No Assessment of Violence: None Noted Violent Behavior Description: None identified Does patient have access to weapons?: No Criminal Charges Pending?: No Does patient have a court date: No Is patient on probation?: No  Psychosis Hallucinations: Visual Delusions: None noted  Mental Status Report Appearance/Hygiene: In scrubs Eye Contact: Good Motor Activity: Freedom of movement Speech: Logical/coherent Level of Consciousness: Alert, Irritable Mood: Irritable, Anxious Affect: Anxious, Irritable Anxiety Level: Minimal Thought Processes: Coherent, Relevant Judgement: Partial Orientation: Person, Place, Time, Situation Obsessive Compulsive Thoughts/Behaviors: None  Cognitive Functioning Concentration: Normal Memory: Recent Intact, Remote Intact IQ: Average Insight: Poor Impulse Control: Poor Appetite: Good Weight  Loss: 0 Weight Gain: 0 Sleep: No Change Total Hours of Sleep: 8 Vegetative Symptoms: None  ADLScreening [facility] Assessment Services) Patient's cognitive ability adequate to safely complete daily activities?: Yes Patient able to express need for assistance with ADLs?: Yes Independently performs ADLs?: Yes (appropriate for developmental age)  Prior Inpatient Therapy Prior Inpatient Therapy: Yes Prior Therapy Dates: 11/2015 [facility] Reason for Treatment: PTSD  Prior Outpatient Therapy Prior Outpatient Therapy: Yes Prior Therapy Dates: current Prior Therapy Facilty/Provider(s): [facility] Reason for Treatment: ptsd Does patient have an ACCT team?: No Does patient have Intensive In-House Services?  : No Does patient have [facility] services? : No Does patient have P4CC services?: No  ADL Screening (condition at time of admission) Patient's cognitive ability adequate to safely complete daily activities?: Yes Patient able to express need for assistance with ADLs?: Yes Independently performs ADLs?: Yes (appropriate for developmental age)       Abuse/Neglect Assessment (Assessment to be complete while patient is alone) Physical Abuse: Denies Verbal Abuse: Denies Sexual Abuse: Denies Exploitation of patient/patient's resources: Denies Self-Neglect: Denies Values / Beliefs Cultural Requests During Hospitalization: None Spiritual Requests During Hospitalization: None Consults Spiritual Care Consult Needed: No Social Work Consult Needed: No Merchant navy officerAdvance Directives (For Healthcare) Does patient have an advance directive?: No Would patient like information on creating an advanced directive?: No - patient declined information    Additional Information 1:1 In Past 12 Months?: No CIRT Risk: No Elopement Risk: No Does patient have medical clearance?: Yes     Disposition:  Disposition Initial Assessment Completed for this Encounter: Yes Disposition of  Patient: Other dispositions Other disposition(s): Other (Comment) (Pending Psych MD consult)  On Site Evaluation by:   Reviewed with Physician:  Jaterrius Ricketson C Mckinzee Spirito 12/25/2015 7:04 AM

## 2015-12-26 DIAGNOSIS — F10929 Alcohol use, unspecified with intoxication, unspecified: Secondary | ICD-10-CM | POA: Insufficient documentation

## 2015-12-26 DIAGNOSIS — F332 Major depressive disorder, recurrent severe without psychotic features: Secondary | ICD-10-CM

## 2015-12-26 DIAGNOSIS — F10120 Alcohol abuse with intoxication, uncomplicated: Secondary | ICD-10-CM

## 2015-12-26 MED ORDER — VENLAFAXINE HCL ER 75 MG PO CP24
150.0000 mg | ORAL_CAPSULE | Freq: Every day | ORAL | Status: DC
  Administered 2015-12-27 – 2015-12-31 (×5): 150 mg via ORAL
  Filled 2015-12-26 (×4): qty 2

## 2015-12-26 MED ORDER — PROMETHAZINE HCL 25 MG PO TABS
12.5000 mg | ORAL_TABLET | Freq: Once | ORAL | Status: AC
  Administered 2015-12-26: 12.5 mg via ORAL
  Filled 2015-12-26: qty 1

## 2015-12-26 MED ORDER — TRAZODONE HCL 100 MG PO TABS
100.0000 mg | ORAL_TABLET | Freq: Every day | ORAL | Status: DC
  Administered 2015-12-26 – 2016-01-06 (×12): 100 mg via ORAL
  Filled 2015-12-26 (×5): qty 1

## 2015-12-26 MED ORDER — LORAZEPAM 1 MG PO TABS
ORAL_TABLET | ORAL | Status: AC
  Administered 2015-12-26: 1 mg via ORAL
  Filled 2015-12-26: qty 1

## 2015-12-26 MED ORDER — GABAPENTIN 100 MG PO CAPS
100.0000 mg | ORAL_CAPSULE | Freq: Three times a day (TID) | ORAL | Status: DC
  Administered 2015-12-26 – 2016-01-08 (×39): 100 mg via ORAL
  Filled 2015-12-26 (×18): qty 1

## 2015-12-26 MED ORDER — CHLORDIAZEPOXIDE HCL 25 MG PO CAPS
25.0000 mg | ORAL_CAPSULE | Freq: Three times a day (TID) | ORAL | Status: DC
  Administered 2015-12-26 – 2015-12-27 (×4): 25 mg via ORAL
  Filled 2015-12-26 (×4): qty 1

## 2015-12-26 MED ORDER — TRAZODONE HCL 100 MG PO TABS
ORAL_TABLET | ORAL | Status: AC
  Administered 2015-12-26: 100 mg via ORAL
  Filled 2015-12-26: qty 1

## 2015-12-26 MED ORDER — VENLAFAXINE HCL ER 75 MG PO CP24
ORAL_CAPSULE | ORAL | Status: AC
  Filled 2015-12-26: qty 1

## 2015-12-26 MED ORDER — HYDROXYZINE HCL 25 MG PO TABS
25.0000 mg | ORAL_TABLET | Freq: Every day | ORAL | Status: DC
  Administered 2015-12-26 – 2016-01-06 (×12): 25 mg via ORAL
  Filled 2015-12-26 (×6): qty 1

## 2015-12-26 MED ORDER — LOPERAMIDE HCL 2 MG PO CAPS
2.0000 mg | ORAL_CAPSULE | Freq: Once | ORAL | Status: AC
  Administered 2015-12-26: 2 mg via ORAL
  Filled 2015-12-26 (×2): qty 1

## 2015-12-26 NOTE — ED Notes (Signed)
Pt is awake in bed watching TV this evening. Pt mood is sad/depressed but she is cooperative with staff. Pt c/o diarrhea. Writer provided food and drink as well as PRN medications, although she is still seeking additional medications regardless. Pt is in no distress. 15 minute checks are ongoing for safety.
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
## 2015-12-26 NOTE — ED Provider Notes (Signed)
-----------------------------------------   6:41 AM on 12/26/2015 -----------------------------------------   Blood pressure 132/84, pulse 66, temperature 98 F (36.7 C), temperature source Oral, resp. rate 16, height 5\' 8"  (1.727 m), weight 236 lb (107.049 kg), SpO2 96 %.  The patient had no acute events since last update.  Calm and cooperative at this time.  Disposition is pending per Psychiatry/Behavioral Medicine team recommendations.      Irean HongJade J Sung, MD 12/26/15 [phone]

## 2015-12-26 NOTE — ED Notes (Signed)
Pt. Alert and oriented, warm and dry, in no distress. Pt. Denies SI, HI, and AVH. Pt complaining of withdraw symptoms. CIWA score was a 6. ED doctor made aware. Orders placed. Pt. Encouraged to let nursing staff know of any concerns or needs.

## 2015-12-26 NOTE — ED Notes (Signed)
Spoke with pt and informed her that DR wants her adn in patient psych ,she is agreeable with that plan

## 2015-12-27 NOTE — ED Notes (Signed)
Pt was informed she will be held in bhu for observation

## 2015-12-27 NOTE — Consult Note (Signed)
[facility] Face-to-Face Psychiatry Consult   Reason for Consult:  Follow-up for this 40 year old woman with a history of mood disorder anxiety disorder and personality disorder who has been in the emergency room over the weekend. Referring Physician:  Sharma Covert Patient Identification: CHERIA SADIQ MRN:  [id number] Principal Diagnosis: <principal problem not specified> Diagnosis:   Patient Active Problem List   Diagnosis Date Noted  . Alcohol intoxication (HCC) [F10.129]   . Severe episode of recurrent major depressive disorder, without psychotic features (HCC) [F33.2]   . Malingering [Z76.5] 12/16/2015  . Major depressive disorder, recurrent episode, moderate (HCC) [F33.1] 12/12/2015  . Closed pelvic fracture (HCC) [S32.9XXA] 10/31/2015  . Tobacco abuse [Z72.0] 10/31/2015  . Tobacco use disorder [F17.200] 03/28/2015  . PTSD (post-traumatic stress disorder) [F43.10] 03/28/2015  . Stimulant use disorder (cocaine) [F15.90] 03/28/2015  . Sedative, hypnotic or anxiolytic use disorder, severe, dependence (HCC) [F13.20] 03/27/2015  . Opioid use disorder, severe, dependence (HCC) [F11.20] 12/20/2014  . Alcohol use disorder severe. [F10.20] 12/18/2014  . Borderline personality disorder [F60.3] 12/18/2014    Total Time spent with patient: 45 minutes  Subjective:   Sandy Mason is a 40 y.o. female patient admitted with "I'm doing better".  Follow-up patient for May 30. Patient reports her mood is still feeling depressed and she still has suicidal thoughts. She has been demanding of large amounts of benzodiazepine in the emergency room. Affect is withdrawn and flat. She still feels that she needs to be in the hospital.  HPI:  Patient interviewed. Chart reviewed. Vitals and labs in medicines reviewed. Patient says that her mood is better today. She repeats to me her anger at having been discharged from the hospital in a way that she thought was "too early". She says she was feeling too nervous and  agitated at the time she left. After resting in the emergency room over the weekend her mood is improved. She denies any suicidal or homicidal ideation. Denies having any hallucinations. She does say she is still feeling anxious and also complains that she would like to be back on the full 15 mg of mirtazapine that she was taking before. Patient however has no objections to being discharged today.  Social history: Still living with a friend and it sounds like that's the environment she is going back to. She feels fairly confident that it will be safe.  Medical history: Patient has a history of some the past also multiple substance abuse no other active major medical problems right now.  Substance abuse history: History of abuse of multiple substances opiates as well as stimulants in the past.  Past Psychiatric History: Patient has a history of mood instability with the diagnosis variously of bipolar disorder PTSD and borderline personality disorder. Has a tendency to become explosive at times. She has been relatively stable in the emergency room not aggressive or violent. She does have a past history of suicide attempts and aggression however.  Risk to Self: Suicidal Ideation: Yes-Currently Present Suicidal Intent: Yes-Currently Present Is patient at risk for suicide?: Yes Suicidal Plan?: Yes-Currently Present Specify Current Suicidal Plan: Pt reports she walked out in front of a car Access to Means: Yes Specify Access to Suicidal Means: access to traffic What has been your use of drugs/alcohol within the last 12 months?: alcohol How many times?: 2 Other Self Harm Risks: None identified Triggers for Past Attempts: Unknown Intentional Self Injurious Behavior: None Risk to Others: Homicidal Ideation: No Thoughts of Harm to Others: No Current Homicidal Intent:  No Current Homicidal Plan: No Access to Homicidal Means: No Identified Victim: None identified History of harm to others?:  No Assessment of Violence: None Noted Violent Behavior Description: None identified Does patient have access to weapons?: No Criminal Charges Pending?: No Does patient have a court date: No Prior Inpatient Therapy: Prior Inpatient Therapy: Yes Prior Therapy Dates: 11/2015 Prior Therapy Facilty/Provider(s): [facility] Reason for Treatment: PTSD Prior Outpatient Therapy: Prior Outpatient Therapy: Yes Prior Therapy Dates: current Prior Therapy Facilty/Provider(s): [facility] Reason for Treatment: ptsd Does patient have an ACCT team?: No Does patient have Intensive In-House Services?  : No Does patient have [facility] services? : No Does patient have P4CC services?: No  Past Medical History:  Past Medical History  Diagnosis Date  . CVA (cerebral infarction)   . Seizures (HCC)   . Depression   . Anxiety   . Suicide attempt by hanging (HCC)   . Alcohol use disorder severe. 12/18/2014  . 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 Borderline personality disorder 12/18/2014  . Opioid use disorder, severe, dependence (HCC) 12/20/2014  . 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 PTSD (post-traumatic stress disorder) 03/28/2015   History reviewed. No pertinent past surgical history. Family History:  Family History  Problem Relation Age of Onset  . Diabetes Father    Family Psychiatric  History: Family history positive for mood instability Social History:  History  Alcohol Use  . 3.6 oz/week  . 6 Cans of beer per week    Comment: tonight 6      History  Drug Use  . Yes  . Special: Benzodiazepines, Hydrocodone    Social History   Social History  . Marital Status: Single    Spouse Name: N/A  . Number of Children: N/A  . Years of Education: N/A   Social History Main Topics  . Smoking status: Current Every Day Smoker -- 1.00 packs/day    Types: Cigarettes  . Smokeless tobacco: None  . Alcohol Use: 3.6  oz/week    6 Cans of beer per week     Comment: tonight 6   . Drug Use: Yes    Special: Benzodiazepines, Hydrocodone  . Sexual Activity: Yes    Birth Control/ Protection: IUD   Other Topics Concern  . None   Social History Narrative   Additional Social History:    Allergies:   Allergies  Allergen Reactions  . Depakote [Valproic Acid] Other (See Comments)    Reaction:  Seizures   . Haldol [Haloperidol Lactate] Other (See Comments)    Reaction:  Seizures   . Keflet [Cephalexin] Rash    Labs: No results found for this or any previous visit (from the past 48 hour(s)).  Current Facility-Administered Medications  Medication Dose Route Frequency Provider Last Rate Last Dose  . cyclobenzaprine (FLEXERIL) tablet 5 mg  5 mg Oral TID PRN Loleta Rose, MD   5 mg at 12/27/15 1626  . gabapentin (NEURONTIN) capsule 100 mg  100 mg Oral TID Brandy Hale, MD   100 mg at 12/27/15 2100  . hydrOXYzine (ATARAX/VISTARIL) tablet 25 mg  25 mg Oral QHS Loleta Rose, MD   25 mg at 12/27/15 2100  . lidocaine (LIDODERM) 5 % 2 patch  2 patch Transdermal Q24H Loleta Rose, MD   2 patch at 12/27/15 1706  . meloxicam (MOBIC) tablet 7.5 mg  7.5 mg Oral BID WC Loleta Rose, MD   7.5 mg at 12/27/15 2127  .  prazosin (MINIPRESS) capsule 2 mg  2 mg Oral QHS Loleta Roseory Forbach, MD   2 mg at 12/27/15 2100  . traZODone (DESYREL) tablet 100 mg  100 mg Oral QHS Minna AntisKevin Paduchowski, MD   100 mg at 12/27/15 2100  . venlafaxine XR (EFFEXOR-XR) 24 hr capsule 150 mg  150 mg Oral Q breakfast Brandy HaleUzma Faheem, MD   150 mg at 12/27/15 [id number]   Current Outpatient Prescriptions  Medication Sig Dispense Refill  . cyclobenzaprine (FLEXERIL) 5 MG tablet Take 1 tablet (5 mg total) by mouth 3 (three) times daily as needed for muscle spasms. 15 tablet 0  . gabapentin (NEURONTIN) 300 MG capsule Take 3 capsules (900 mg total) by mouth 3 (three) times daily. 120 capsule 0  . hydrOXYzine (VISTARIL) 25 MG capsule Take 1 capsule (25 mg total) by mouth 3  (three) times daily as needed. 30 capsule 0  . lidocaine (LIDODERM) 5 % Place 2 patches onto the skin daily. Remove & Discard patch within 12 hours or as directed by MD 60 patch 0  . meloxicam (MOBIC) 7.5 MG tablet Take 1 tablet (7.5 mg total) by mouth 2 (two) times daily with a meal. 60 tablet 0  . mirtazapine (REMERON) 15 MG tablet Take 1 tablet (15 mg total) by mouth at bedtime. 30 tablet 0  . nitrofurantoin, macrocrystal-monohydrate, (MACROBID) 100 MG capsule Take 1 capsule (100 mg total) by mouth every 12 (twelve) hours. 3 capsule 0  . prazosin (MINIPRESS) 2 MG capsule Take 1 capsule (2 mg total) by mouth at bedtime. 30 capsule 0  . venlafaxine XR (EFFEXOR-XR) 150 MG 24 hr capsule Take 2 capsules (300 mg total) by mouth daily with breakfast. 30 capsule 0  . Vitamin D, Ergocalciferol, (DRISDOL) 50000 units CAPS capsule Take 50,000 Units by mouth once a week.      Musculoskeletal: Strength & Muscle Tone: within normal limits Gait & Station: normal Patient leans: N/A  Psychiatric Specialty Exam: Physical Exam  Nursing note and vitals reviewed. Constitutional: She appears well-developed and well-nourished.  HENT:  Head: Normocephalic and atraumatic.  Eyes: Conjunctivae are normal. Pupils are equal, round, and reactive to light.  Neck: Normal range of motion.  Cardiovascular: Normal rate and normal heart sounds.   Respiratory: Effort normal. No respiratory distress.  GI: Soft.  Musculoskeletal: Normal range of motion.  Neurological: She is alert.  Skin: Skin is warm and dry.  Psychiatric: She has a normal mood and affect. Her behavior is normal. Judgment and thought content normal.    Review of Systems  Constitutional: Negative.   HENT: Negative.   Eyes: Negative.   Respiratory: Negative.   Cardiovascular: Negative.   Gastrointestinal: Negative.   Musculoskeletal: Negative.   Skin: Negative.   Neurological: Negative.   Psychiatric/Behavioral: Negative for depression, suicidal  ideas, hallucinations, memory loss and substance abuse. The patient is nervous/anxious and has insomnia.     Blood pressure 118/66, pulse 82, temperature 98.2 F (36.8 C), temperature source Oral, resp. rate 20, height 5\' 8"  (1.727 m), weight 107.049 kg (236 lb), SpO2 97 %.Body mass index is 35.89 kg/(m^2).  General Appearance: Casual  Eye Contact:  Fair  Speech:  Normal Rate  Volume:  Decreased  Mood:  Anxious  Affect:  Constricted  Thought Process:  Goal Directed  Orientation:  Full (Time, Place, and Person)  Thought Content:  Negative  Suicidal Thoughts:  No  Homicidal Thoughts:  No  Memory:  Immediate;   Fair Recent;   Fair Remote;   Fair  Judgement:  Fair  Insight:  Fair  Psychomotor Activity:  Normal  Concentration:  Concentration: Good  Recall:  Fair  Fund of Knowledge:  Fair  Language:  Fair  Akathisia:  No  Handed:  Right  AIMS (if indicated):     Assets:  Desire for Improvement Housing Physical Health  ADL's:  Intact  Cognition:  WNL  Sleep:        Treatment Plan Summary: Patient appears to be dependent on coming into the hospital. Gets little benefit from hospital level treatment but keeps returning to the emergency room. Voices suicidal ideation but mostly stays withdrawn. Using excessive amounts of benzodiazepine. Benzos will all be discontinued. Patient will be reevaluated tomorrow and we will attempt to get her discharged if he can be safely done.  Disposition: Patient does not meet criteria for psychiatric inpatient admission. Supportive therapy provided about ongoing stressors.  Mordecai Rasmussen, MD 12/27/2015 10:27 PM

## 2015-12-27 NOTE — ED Provider Notes (Signed)
-----------------------------------------   7:15 AM on 12/27/2015 -----------------------------------------   Blood pressure 101/63, pulse 99, temperature 98 F (36.7 C), temperature source Oral, resp. rate 16, height 5\' 8"  (1.727 m), weight 236 lb (107.049 kg), SpO2 99 %.  The patient had no acute events since last update.  Calm and cooperative at this time.  Disposition is pending per Psychiatry/Behavioral Medicine team recommendations.      Irean HongJade J Glenys Snader, MD 12/27/15 [phone]

## 2015-12-28 MED ORDER — IBUPROFEN 800 MG PO TABS
ORAL_TABLET | ORAL | Status: AC
  Filled 2015-12-28: qty 1

## 2015-12-28 MED ORDER — IBUPROFEN 800 MG PO TABS
800.0000 mg | ORAL_TABLET | Freq: Once | ORAL | Status: AC
  Administered 2015-12-29: 800 mg via ORAL
  Filled 2015-12-28: qty 1

## 2015-12-28 MED ORDER — IBUPROFEN 800 MG PO TABS
800.0000 mg | ORAL_TABLET | Freq: Once | ORAL | Status: AC
  Administered 2015-12-28: 800 mg via ORAL

## 2015-12-28 NOTE — ED Notes (Signed)
Patient asleep in room. No noted distress or abnormal behavior. Will continue 15 minute checks and observation by security cameras for safety. 

## 2015-12-28 NOTE — ED Notes (Signed)
Patient states that she is feeling the most depressed she has ever felt in her life. She is sleeping a lot because she hasn't been able to sleep for the past 3 days due to a lack of medicine. She says that if she had the means she actually would kill herself. She requests to be admitted downstairs because she was hallucinating at the last admission which is why she didn't want to be around people. She remains calm and cooperative no signs of acute distress. Maintained on 15 minute checks and observation by security camera for safety.

## 2015-12-28 NOTE — ED Notes (Addendum)
Patient currently asleep in room. Patient was compliant with all scheduled medications. No noted distress or abnormal behavior. Will continue 15 minute checks and observation by security cameras for safety.

## 2015-12-28 NOTE — ED Notes (Signed)
Patient in room watching television at this time. No signs of acute distress. Maintained on 15 minute checks and observation by security camera for safety.

## 2015-12-28 NOTE — Consult Note (Signed)
[facility] Face-to-Face Psychiatry Consult   Reason for Consult:  Follow-up for this 40 year old woman with a history of mood disorder anxiety disorder and personality disorder who has been in the emergency room over the weekend. Referring Physician:  Sharma Covert Patient Identification: Sandy Mason MRN:  [id number] Principal Diagnosis: <principal problem not specified> Diagnosis:   Patient Active Problem List   Diagnosis Date Noted  . Alcohol intoxication (HCC) [F10.129]   . Severe episode of recurrent major depressive disorder, without psychotic features (HCC) [F33.2]   . Malingering [Z76.5] 12/16/2015  . Major depressive disorder, recurrent episode, moderate (HCC) [F33.1] 12/12/2015  . Closed pelvic fracture (HCC) [S32.9XXA] 10/31/2015  . Tobacco abuse [Z72.0] 10/31/2015  . Tobacco use disorder [F17.200] 03/28/2015  . PTSD (post-traumatic stress disorder) [F43.10] 03/28/2015  . Stimulant use disorder (cocaine) [F15.90] 03/28/2015  . Sedative, hypnotic or anxiolytic use disorder, severe, dependence (HCC) [F13.20] 03/27/2015  . Opioid use disorder, severe, dependence (HCC) [F11.20] 12/20/2014  . Alcohol use disorder severe. [F10.20] 12/18/2014  . Borderline personality disorder [F60.3] 12/18/2014    Total Time spent with patient: 45 minutes  Subjective:   Sandy Mason is a 40 y.o. female patient admitted with "I'm doing better".  Follow-up Wednesday the 31st. Patient continues to state she has suicidal ideation. Says she is very depressed. She is staying in bed virtually all the time. Takes a lot of medication if she can. We have had to discontinue a lot of benzodiazepines. Patient is frequently demanding. Has not cooperated or benefited much from inpatient treatment. Appears to be inappropriate for treatment on our unit but at the same time is refusing to cooperate in discussing a safety plan outside the hospital.  HPI:  Patient interviewed. Chart reviewed. Vitals and labs in medicines  reviewed. Patient says that her mood is better today. She repeats to me her anger at having been discharged from the hospital in a way that she thought was "too early". She says she was feeling too nervous and agitated at the time she left. After resting in the emergency room over the weekend her mood is improved. She denies any suicidal or homicidal ideation. Denies having any hallucinations. She does say she is still feeling anxious and also complains that she would like to be back on the full 15 mg of mirtazapine that she was taking before. Patient however has no objections to being discharged today.  Social history: Still living with a friend and it sounds like that's the environment she is going back to. She feels fairly confident that it will be safe.  Medical history: Patient has a history of some the past also multiple substance abuse no other active major medical problems right now.  Substance abuse history: History of abuse of multiple substances opiates as well as stimulants in the past.  Past Psychiatric History: Patient has a history of mood instability with the diagnosis variously of bipolar disorder PTSD and borderline personality disorder. Has a tendency to become explosive at times. She has been relatively stable in the emergency room not aggressive or violent. She does have a past history of suicide attempts and aggression however.  Risk to Self: Suicidal Ideation: Yes-Currently Present Suicidal Intent: Yes-Currently Present Is patient at risk for suicide?: Yes Suicidal Plan?: Yes-Currently Present Specify Current Suicidal Plan: Pt reports she walked out in front of a car Access to Means: Yes Specify Access to Suicidal Means: access to traffic What has been your use of drugs/alcohol within the last 12 months?: alcohol How  many times?: 2 Other Self Harm Risks: None identified Triggers for Past Attempts: Unknown Intentional Self Injurious Behavior: None Risk to Others: Homicidal  Ideation: No Thoughts of Harm to Others: No Current Homicidal Intent: No Current Homicidal Plan: No Access to Homicidal Means: No Identified Victim: None identified History of harm to others?: No Assessment of Violence: None Noted Violent Behavior Description: None identified Does patient have access to weapons?: No Criminal Charges Pending?: No Does patient have a court date: No Prior Inpatient Therapy: Prior Inpatient Therapy: Yes Prior Therapy Dates: 11/2015 Prior Therapy Facilty/Provider(s): [facility] Reason for Treatment: PTSD Prior Outpatient Therapy: Prior Outpatient Therapy: Yes Prior Therapy Dates: current Prior Therapy Facilty/Provider(s): [facility] Reason for Treatment: ptsd Does patient have an ACCT team?: No Does patient have Intensive In-House Services?  : No Does patient have [facility] services? : No Does patient have P4CC services?: No  Past Medical History:  Past Medical History  Diagnosis Date  . CVA (cerebral infarction)   . Seizures (HCC)   . Depression   . Anxiety   . Suicide attempt by hanging (HCC)   . Alcohol use disorder severe. 12/18/2014  . 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 Borderline personality disorder 12/18/2014  . Opioid use disorder, severe, dependence (HCC) 12/20/2014  . 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 PTSD (post-traumatic stress disorder) 03/28/2015   History reviewed. No pertinent past surgical history. Family History:  Family History  Problem Relation Age of Onset  . Diabetes Father    Family Psychiatric  History: Family history positive for mood instability Social History:  History  Alcohol Use  . 3.6 oz/week  . 6 Cans of beer per week    Comment: tonight 6      History  Drug Use  . Yes  . Special: Benzodiazepines, Hydrocodone    Social History   Social History  . Marital Status: Single    Spouse Name: N/A  . Number of Children:  N/A  . Years of Education: N/A   Social History Main Topics  . Smoking status: Current Every Day Smoker -- 1.00 packs/day    Types: Cigarettes  . Smokeless tobacco: None  . Alcohol Use: 3.6 oz/week    6 Cans of beer per week     Comment: tonight 6   . Drug Use: Yes    Special: Benzodiazepines, Hydrocodone  . Sexual Activity: Yes    Birth Control/ Protection: IUD   Other Topics Concern  . None   Social History Narrative   Additional Social History:    Allergies:   Allergies  Allergen Reactions  . Depakote [Valproic Acid] Other (See Comments)    Reaction:  Seizures   . Haldol [Haloperidol Lactate] Other (See Comments)    Reaction:  Seizures   . Keflet [Cephalexin] Rash    Labs: No results found for this or any previous visit (from the past 48 hour(s)).  Current Facility-Administered Medications  Medication Dose Route Frequency Provider Last Rate Last Dose  . cyclobenzaprine (FLEXERIL) tablet 5 mg  5 mg Oral TID PRN Loleta Rose, MD   5 mg at 12/28/15 1658  . gabapentin (NEURONTIN) capsule 100 mg  100 mg Oral TID Brandy Hale, MD   100 mg at 12/28/15 1654  . hydrOXYzine (ATARAX/VISTARIL) tablet 25 mg  25 mg Oral QHS Loleta Rose, MD   25 mg at 12/27/15 2100  . lidocaine (LIDODERM) 5 % 2 patch  2 patch Transdermal  Q24H Loleta Roseory Forbach, MD   2 patch at 12/28/15 [phone]  . meloxicam (MOBIC) tablet 7.5 mg  7.5 mg Oral BID WC Loleta Roseory Forbach, MD   7.5 mg at 12/28/15 1654  . prazosin (MINIPRESS) capsule 2 mg  2 mg Oral QHS Loleta Roseory Forbach, MD   2 mg at 12/27/15 2100  . traZODone (DESYREL) tablet 100 mg  100 mg Oral QHS Minna AntisKevin Paduchowski, MD   100 mg at 12/27/15 2100  . venlafaxine XR (EFFEXOR-XR) 24 hr capsule 150 mg  150 mg Oral Q breakfast Brandy HaleUzma Faheem, MD   150 mg at 12/28/15 [id number]   Current Outpatient Prescriptions  Medication Sig Dispense Refill  . cyclobenzaprine (FLEXERIL) 5 MG tablet Take 1 tablet (5 mg total) by mouth 3 (three) times daily as needed for muscle spasms. 15 tablet 0  .  gabapentin (NEURONTIN) 300 MG capsule Take 3 capsules (900 mg total) by mouth 3 (three) times daily. 120 capsule 0  . hydrOXYzine (VISTARIL) 25 MG capsule Take 1 capsule (25 mg total) by mouth 3 (three) times daily as needed. 30 capsule 0  . lidocaine (LIDODERM) 5 % Place 2 patches onto the skin daily. Remove & Discard patch within 12 hours or as directed by MD 60 patch 0  . meloxicam (MOBIC) 7.5 MG tablet Take 1 tablet (7.5 mg total) by mouth 2 (two) times daily with a meal. 60 tablet 0  . mirtazapine (REMERON) 15 MG tablet Take 1 tablet (15 mg total) by mouth at bedtime. 30 tablet 0  . nitrofurantoin, macrocrystal-monohydrate, (MACROBID) 100 MG capsule Take 1 capsule (100 mg total) by mouth every 12 (twelve) hours. 3 capsule 0  . prazosin (MINIPRESS) 2 MG capsule Take 1 capsule (2 mg total) by mouth at bedtime. 30 capsule 0  . venlafaxine XR (EFFEXOR-XR) 150 MG 24 hr capsule Take 2 capsules (300 mg total) by mouth daily with breakfast. 30 capsule 0  . Vitamin D, Ergocalciferol, (DRISDOL) 50000 units CAPS capsule Take 50,000 Units by mouth once a week.      Musculoskeletal: Strength & Muscle Tone: within normal limits Gait & Station: normal Patient leans: N/A  Psychiatric Specialty Exam: Physical Exam  Nursing note and vitals reviewed. Constitutional: She appears well-developed and well-nourished.  HENT:  Head: Normocephalic and atraumatic.  Eyes: Conjunctivae are normal. Pupils are equal, round, and reactive to light.  Neck: Normal range of motion.  Cardiovascular: Normal rate and normal heart sounds.   Respiratory: Effort normal. No respiratory distress.  GI: Soft.  Musculoskeletal: Normal range of motion.  Neurological: She is alert.  Skin: Skin is warm and dry.  Psychiatric: She has a normal mood and affect. Her behavior is normal. Judgment and thought content normal.    Review of Systems  Constitutional: Negative.   HENT: Negative.   Eyes: Negative.   Respiratory: Negative.    Cardiovascular: Negative.   Gastrointestinal: Negative.   Musculoskeletal: Negative.   Skin: Negative.   Neurological: Negative.   Psychiatric/Behavioral: Negative for depression, suicidal ideas, hallucinations, memory loss and substance abuse. The patient is nervous/anxious and has insomnia.     Blood pressure 115/67, pulse 76, temperature 97.8 F (36.6 C), temperature source Oral, resp. rate 18, height 5\' 8"  (1.727 m), weight 107.049 kg (236 lb), SpO2 98 %.Body mass index is 35.89 kg/(m^2).  General Appearance: Casual  Eye Contact:  Fair  Speech:  Normal Rate  Volume:  Decreased  Mood:  Anxious  Affect:  Constricted  Thought Process:  Goal Directed  Orientation:  Full (Time, Place, and Person)  Thought Content:  Negative  Suicidal Thoughts:  No  Homicidal Thoughts:  No  Memory:  Immediate;   Fair Recent;   Fair Remote;   Fair  Judgement:  Fair  Insight:  Fair  Psychomotor Activity:  Normal  Concentration:  Concentration: Good  Recall:  Fair  Fund of Knowledge:  Fair  Language:  Fair  Akathisia:  No  Handed:  Right  AIMS (if indicated):     Assets:  Desire for Improvement Housing Physical Health  ADL's:  Intact  Cognition:  WNL  Sleep:        Treatment Plan Summary: Patient was severe recurrent depression possible bipolar disorder possible PTSD. Continues to report suicidal ideation. Has had a couple hospitalizations in a row without any clear benefit and with immediate returns to the hospital. Unlikely that further hospital level treatment will be of help. At this point I have discussed the case with TTS and did that the patient be referred to [facility]. Continue current medicine for now.  Disposition: Patient does not meet criteria for psychiatric inpatient admission. Supportive therapy provided about ongoing stressors.  Mordecai Rasmussen, MD 12/28/2015 7:15 PM

## 2015-12-28 NOTE — ED Notes (Signed)
ENVIRONMENTAL ASSESSMENT Potentially harmful objects out of patient reach: Yes Personal belongings secured: Yes Patient dressed in hospital provided attire only: Yes Plastic bags out of patient reach: Yes Patient care equipment (cords, cables, call bells, lines, and drains) shortened, removed, or accounted for: Yes Equipment and supplies removed from bottom of stretcher: Yes Potentially toxic materials out of patient reach: Yes Sharps container removed or out of patient reach: Yes  Patient is currently in bed sleeping. No signs of distress noted. Maintained on 15 minute checks and observation by security camera for safety.

## 2015-12-28 NOTE — BH Assessment (Signed)
Patient referred to [facility], pending review.    State Referral Form and supporting documentation completed and faxed to [employer].   Received the Authorization/Tracking # [id number] from [employer] ([phone]).

## 2015-12-28 NOTE — BH Assessment (Signed)
Information faxed to [employer], for Authorization Number for [facility].  Patient Referred for inpatient treatment but declined:  [facility] -"Unable Appropriately Treat"  [facility] ([place]-[phone]), "Unable Appropriately Treat"  [facility]-([phone]), No Beds  [place], [phone]), No Beds  [facility] ([phone]), No Beds

## 2015-12-28 NOTE — ED Provider Notes (Signed)
-----------------------------------------   7:13 AM on 12/28/2015 -----------------------------------------   Blood pressure 110/63, pulse 69, temperature 97.9 F (36.6 C), temperature source Oral, resp. rate 18, height 5\' 8"  (1.727 m), weight 236 lb (107.049 kg), SpO2 98 %.  The patient had no acute events since last update.  Calm and cooperative at this time.  Disposition is pending per Psychiatry/Behavioral Medicine team recommendations.     Irean HongJade J Kotaro Buer, MD 12/28/15 [phone]

## 2015-12-28 NOTE — ED Notes (Signed)
Patient currently denies SI and states that if given the means she wouldn't harm herself. Denies HI, states that sometimes she has visual hallucinations of shadows but does not report any in the hospital. Patient currently denies pain, and also endorses depression and anxiety. Patient is calm and cooperative and wishes to see psychiatrist again for re-evaluation. No signs of acute distress noted. Maintained on 15 minute checks and observation by security camera for safety.

## 2015-12-28 NOTE — ED Notes (Signed)
Patient resting quietly in room. No noted distress or abnormal behaviors noted. Will continue 15 minute checks and observation by security camera for safety. 

## 2015-12-29 NOTE — ED Notes (Signed)
Patient asleep in room. No noted distress or abnormal behavior. Will continue 15 minute checks and observation by security cameras for safety. 

## 2015-12-29 NOTE — ED Notes (Signed)
Patient asleep in room. Patient received lunch tray.  No noted distress or abnormal behavior. Will continue 15 minute checks and observation by security cameras for safety.

## 2015-12-29 NOTE — ED Notes (Signed)
Patient is in room resting at this time. Patient requested to take medication later in the day because she wanted to sleep longer. Patient remains calm and cooperative, no signs of acute distress noted. Maintained on 15 minute checks and observation by security camera for safety.

## 2015-12-29 NOTE — ED Notes (Signed)
Patient stated that she had a dream last night where she shot herself with a gun.

## 2015-12-29 NOTE — Progress Notes (Signed)
Graham crackers and peanut butter given as snack per Pt's request.

## 2015-12-29 NOTE — BH Assessment (Signed)
[facility] to [facility], pending review.    (12/28/2015) State Referral Form and supporting documentation completed and faxed to [employer].   (12/28/2015) Received the Authorization/Tracking # [id number] from [employer]   (12/29/2015 )Verbal screening completed with [facility]([place]onya-[phone]), information faxed and confirmed it was received Brett Canales(Steve, [facility]).

## 2015-12-29 NOTE — ED Provider Notes (Signed)
Filed Vitals:   12/28/15 1945 12/29/15 0642  BP: 108/61 110/58  Pulse: 66 65  Temp: 98.4 F (36.9 C) 98.1 F (36.7 C)  Resp: 16 20   Labs Reviewed  CBC WITH DIFFERENTIAL/PLATELET - Abnormal; Notable for the following:    Lymphs Abs 5.4 (*)    All other components within normal limits  URINALYSIS COMPLETEWITH MICROSCOPIC ([facility] ONLY) - Abnormal; Notable for the following:    Color, Urine STRAW (*)    APPearance CLEAR (*)    Specific Gravity, Urine 1.002 (*)    Hgb urine dipstick 2+ (*)    Protein, ur 30 (*)    Bacteria, UA RARE (*)    Squamous Epithelial / LPF 0-5 (*)    All other components within normal limits  COMPREHENSIVE METABOLIC PANEL - Abnormal; Notable for the following:    Potassium 3.2 (*)    CO2 20 (*)    Glucose, Bld 109 (*)    BUN <5 (*)    Total Protein 8.5 (*)    AST 45 (*)    All other components within normal limits  ACETAMINOPHEN LEVEL - Abnormal; Notable for the following:    Acetaminophen (Tylenol), Serum <10 (*)    All other components within normal limits  ETHANOL - Abnormal; Notable for the following:    Alcohol, Ethyl (B) 244 (*)    All other components within normal limits  URINE DRUG SCREEN, QUALITATIVE ([facility] ONLY)  SALICYLATE LEVEL  POC URINE PREG, ED  POCT PREGNANCY, URINE   Patient remains stable for psychiatric disposition and placement  Emily FilbertJonathan E Williams, MD 12/29/15 [phone]

## 2015-12-29 NOTE — ED Notes (Signed)
Patient stated that she had a dream last night where she shot herself with a gun. She states that she wants to be admitted to the inpatient unit and that she will participate in groups. She said that [facility] but was concerned about transportation back to [place]. She also said that [facility] prolonged amount of [facility] was not helping her due to lack of programming. Nurse provided reassurance and stated that she would relay information to psychiatrist. No signs of acute distress noted. Maintained on 15 minute checks and observation by security camera for safety.

## 2015-12-29 NOTE — ED Notes (Signed)
Patient states that she feels the same as she did yesterday. She still feels severely depressed, has passive SI, no HI and and no hallucinations. Continues to complain of pelvic pain rated a 10/10 and treated with scheduled lidocaine patch. Patient is calm and cooperative. Continues to be quite lethargic and has flat affect. No signs of acute distress noted. Maintained on 15 minute checks and observation by security camera for safety.

## 2015-12-29 NOTE — ED Notes (Signed)
ENVIRONMENTAL ASSESSMENT Potentially harmful objects out of patient reach: Yes Personal belongings secured: Yes Patient dressed in hospital provided attire only: Yes Plastic bags out of patient reach: Yes Patient care equipment (cords, cables, call bells, lines, and drains) shortened, removed, or accounted for: Yes Equipment and supplies removed from bottom of stretcher: Yes Potentially toxic materials out of patient reach: Yes Sharps container removed or out of patient reach: Yes  Patient is currently in room sleeping. No signs of acute distress noted. Maintained on 15 minute checks and observation by security camera for safety.  

## 2015-12-29 NOTE — ED Notes (Signed)
Patient is alert and oriented in no apparent distress lying in bed watching television. Patient denies SI, HI, and AVH. Patient encouraged to let nursing staff know of any concerns or needs.Patient advised this RN that she had a dream last night about taking a gun and shooting herself and that she has always thought about walking into traffic and that's why she did walk in traffic and ended up here. Patient reports that she was ready to get help and that she was "really afraid to die".Patient reports to this RN that she shared this information about her dream and that she was afraid to die with the MD today. Patient also advised this RN that her gabapentin had been reduced to (300 mg a day from 900 mg TID) and that she was afraid that she will be unable to sleep but ibuprofen 800 mg would help the pain at night and help her sleep. Patient maintained on Q15 minute checks and observation by security camera for safety.

## 2015-12-29 NOTE — ED Notes (Signed)
Patient currently in room watching television. No signs of acute distress noted at this time. Maintained on 15 minute checks and observation by security camera for safety.  

## 2015-12-29 NOTE — Consult Note (Signed)
[facility] Face-to-Face Psychiatry Consult   Reason for Consult:  Follow-up for this 40 year old woman with a history of mood disorder anxiety disorder and personality disorder who has been in the emergency room over the weekend. Referring Physician:  Sharma Covert Patient Identification: Sandy Mason MRN:  [id number] Principal Diagnosis: <principal problem not specified> Diagnosis:   Patient Active Problem List   Diagnosis Date Noted  . Alcohol intoxication (HCC) [F10.129]   . Severe episode of recurrent major depressive disorder, without psychotic features (HCC) [F33.2]   . Malingering [Z76.5] 12/16/2015  . Major depressive disorder, recurrent episode, moderate (HCC) [F33.1] 12/12/2015  . Closed pelvic fracture (HCC) [S32.9XXA] 10/31/2015  . Tobacco abuse [Z72.0] 10/31/2015  . Tobacco use disorder [F17.200] 03/28/2015  . PTSD (post-traumatic stress disorder) [F43.10] 03/28/2015  . Stimulant use disorder (cocaine) [F15.90] 03/28/2015  . Sedative, hypnotic or anxiolytic use disorder, severe, dependence (HCC) [F13.20] 03/27/2015  . Opioid use disorder, severe, dependence (HCC) [F11.20] 12/20/2014  . Alcohol use disorder severe. [F10.20] 12/18/2014  . Borderline personality disorder [F60.3] 12/18/2014    Total Time spent with patient: 45 minutes  Subjective:   Sandy Mason is a 40 y.o. female patient admitted with "I'm doing better".  Follow-up for Thursday, June 1. Patient interviewed chart reviewed. Patient seems to be more alert and interactive today. She states however that she is still feeling severely depressed. She tells me that she had a dream last night about shooting herself. She also tells me she is still thinking about walking out into traffic. At the same time she has opinions about whether group therapy were helping her. She says that last time she was in the hospital the only reason she didn't participate in treatment was because of hallucinations. I reread discharge summary  from her last hospital stay and understand that the patient has had a large number of admissions with a significant amount of noncompliance and manipulative behavior. At this point however she is still continuing to voice intent suicidal ideation. She is not safe for discharge. [facility] which could be a very long wait. Reevaluate tomorrow and if she still requires hospitalization we may consider admission downstairs if possible. No change to medication for today.  HPI:  Patient interviewed. Chart reviewed. Vitals and labs in medicines reviewed. Patient says that her mood is better today. She repeats to me her anger at having been discharged from the hospital in a way that she thought was "too early". She says she was feeling too nervous and agitated at the time she left. After resting in the emergency room over the weekend her mood is improved. She denies any suicidal or homicidal ideation. Denies having any hallucinations. She does say she is still feeling anxious and also complains that she would like to be back on the full 15 mg of mirtazapine that she was taking before. Patient however has no objections to being discharged today.  Social history: Still living with a friend and it sounds like that's the environment she is going back to. She feels fairly confident that it will be safe.  Medical history: Patient has a history of some the past also multiple substance abuse no other active major medical problems right now.  Substance abuse history: History of abuse of multiple substances opiates as well as stimulants in the past.  Past Psychiatric History: Patient has a history of mood instability with the diagnosis variously of bipolar disorder PTSD and borderline personality disorder. Has a tendency to  become explosive at times. She has been relatively stable in the emergency room not aggressive or violent. She does have a past history of suicide attempts and aggression  however.  Risk to Self: Suicidal Ideation: Yes-Currently Present Suicidal Intent: Yes-Currently Present Is patient at risk for suicide?: Yes Suicidal Plan?: Yes-Currently Present Specify Current Suicidal Plan: Pt reports she walked out in front of a car Access to Means: Yes Specify Access to Suicidal Means: access to traffic What has been your use of drugs/alcohol within the last 12 months?: alcohol How many times?: 2 Other Self Harm Risks: None identified Triggers for Past Attempts: Unknown Intentional Self Injurious Behavior: None Risk to Others: Homicidal Ideation: No Thoughts of Harm to Others: No Current Homicidal Intent: No Current Homicidal Plan: No Access to Homicidal Means: No Identified Victim: None identified History of harm to others?: No Assessment of Violence: None Noted Violent Behavior Description: None identified Does patient have access to weapons?: No Criminal Charges Pending?: No Does patient have a court date: No Prior Inpatient Therapy: Prior Inpatient Therapy: Yes Prior Therapy Dates: 11/2015 Prior Therapy Facilty/Provider(s): [facility] Reason for Treatment: PTSD Prior Outpatient Therapy: Prior Outpatient Therapy: Yes Prior Therapy Dates: current Prior Therapy Facilty/Provider(s): [facility] Reason for Treatment: ptsd Does patient have an ACCT team?: No Does patient have Intensive In-House Services?  : No Does patient have [facility] services? : No Does patient have P4CC services?: No  Past Medical History:  Past Medical History  Diagnosis Date  . CVA (cerebral infarction)   . Seizures (HCC)   . Depression   . Anxiety   . Suicide attempt by hanging (HCC)   . Alcohol use disorder severe. 12/18/2014  . 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 Borderline personality disorder 12/18/2014  . Opioid use disorder, severe, dependence (HCC) 12/20/2014  . Sedative, hypnotic or anxiolytic use disorder, severe, dependence (HCC)  03/27/2015  . PTSD (post-traumatic stress disorder) 03/28/2015   History reviewed. No pertinent past surgical history. Family History:  Family History  Problem Relation Age of Onset  . Diabetes Father    Family Psychiatric  History: Family history positive for mood instability Social History:  History  Alcohol Use  . 3.6 oz/week  . 6 Cans of beer per week    Comment: tonight 6      History  Drug Use  . Yes  . Special: Benzodiazepines, Hydrocodone    Social History   Social History  . Marital Status: Single    Spouse Name: N/A  . Number of Children: N/A  . Years of Education: N/A   Social History Main Topics  . Smoking status: Current Every Day Smoker -- 1.00 packs/day    Types: Cigarettes  . Smokeless tobacco: None  . Alcohol Use: 3.6 oz/week    6 Cans of beer per week     Comment: tonight 6   . Drug Use: Yes    Special: Benzodiazepines, Hydrocodone  . Sexual Activity: Yes    Birth Control/ Protection: IUD   Other Topics Concern  . None   Social History Narrative   Additional Social History:    Allergies:   Allergies  Allergen Reactions  . Depakote [Valproic Acid] Other (See Comments)    Reaction:  Seizures   . Haldol [Haloperidol Lactate] Other (See Comments)    Reaction:  Seizures   . Keflet [Cephalexin] Rash    Labs: No results found for this or any previous visit (from the past 48 hour(s)).  Current Facility-Administered Medications  Medication Dose Route Frequency Provider Last Rate Last Dose  . cyclobenzaprine (FLEXERIL) tablet 5 mg  5 mg Oral TID PRN Loleta Rose, MD   5 mg at 12/28/15 1658  . gabapentin (NEURONTIN) capsule 100 mg  100 mg Oral TID Brandy Hale, MD   100 mg at 12/29/15 1645  . hydrOXYzine (ATARAX/VISTARIL) tablet 25 mg  25 mg Oral QHS Loleta Rose, MD   25 mg at 12/28/15 2134  . lidocaine (LIDODERM) 5 % 2 patch  2 patch Transdermal Q24H Loleta Rose, MD   2 patch at 12/29/15 1054  . meloxicam (MOBIC) tablet 7.5 mg  7.5 mg Oral BID  WC Loleta Rose, MD   7.5 mg at 12/29/15 1645  . prazosin (MINIPRESS) capsule 2 mg  2 mg Oral QHS Loleta Rose, MD   2 mg at 12/28/15 2134  . traZODone (DESYREL) tablet 100 mg  100 mg Oral QHS Minna Antis, MD   100 mg at 12/28/15 2134  . venlafaxine XR (EFFEXOR-XR) 24 hr capsule 150 mg  150 mg Oral Q breakfast Brandy Hale, MD   150 mg at 12/29/15 1051   Current Outpatient Prescriptions  Medication Sig Dispense Refill  . cyclobenzaprine (FLEXERIL) 5 MG tablet Take 1 tablet (5 mg total) by mouth 3 (three) times daily as needed for muscle spasms. 15 tablet 0  . gabapentin (NEURONTIN) 300 MG capsule Take 3 capsules (900 mg total) by mouth 3 (three) times daily. 120 capsule 0  . hydrOXYzine (VISTARIL) 25 MG capsule Take 1 capsule (25 mg total) by mouth 3 (three) times daily as needed. 30 capsule 0  . lidocaine (LIDODERM) 5 % Place 2 patches onto the skin daily. Remove & Discard patch within 12 hours or as directed by MD 60 patch 0  . meloxicam (MOBIC) 7.5 MG tablet Take 1 tablet (7.5 mg total) by mouth 2 (two) times daily with a meal. 60 tablet 0  . mirtazapine (REMERON) 15 MG tablet Take 1 tablet (15 mg total) by mouth at bedtime. 30 tablet 0  . nitrofurantoin, macrocrystal-monohydrate, (MACROBID) 100 MG capsule Take 1 capsule (100 mg total) by mouth every 12 (twelve) hours. 3 capsule 0  . prazosin (MINIPRESS) 2 MG capsule Take 1 capsule (2 mg total) by mouth at bedtime. 30 capsule 0  . venlafaxine XR (EFFEXOR-XR) 150 MG 24 hr capsule Take 2 capsules (300 mg total) by mouth daily with breakfast. 30 capsule 0  . Vitamin D, Ergocalciferol, (DRISDOL) 50000 units CAPS capsule Take 50,000 Units by mouth once a week.      Musculoskeletal: Strength & Muscle Tone: within normal limits Gait & Station: normal Patient leans: N/A  Psychiatric Specialty Exam: Physical Exam  Nursing note and vitals reviewed. Constitutional: She appears well-developed and well-nourished.  HENT:  Head: Normocephalic  and atraumatic.  Eyes: Conjunctivae are normal. Pupils are equal, round, and reactive to light.  Neck: Normal range of motion.  Cardiovascular: Normal rate and normal heart sounds.   Respiratory: Effort normal. No respiratory distress.  GI: Soft.  Musculoskeletal: Normal range of motion.  Neurological: She is alert.  Skin: Skin is warm and dry.  Psychiatric: She has a normal mood and affect. Her behavior is normal. Judgment and thought content normal.    Review of Systems  Constitutional: Negative.   HENT: Negative.   Eyes: Negative.   Respiratory: Negative.   Cardiovascular: Negative.   Gastrointestinal: Negative.   Musculoskeletal: Negative.   Skin: Negative.   Neurological: Negative.  Psychiatric/Behavioral: Negative for depression, suicidal ideas, hallucinations, memory loss and substance abuse. The patient is nervous/anxious and has insomnia.     Blood pressure 105/66, pulse 74, temperature 98.1 F (36.7 C), temperature source Oral, resp. rate 16, height 5\' 8"  (1.727 m), weight 107.049 kg (236 lb), SpO2 99 %.Body mass index is 35.89 kg/(m^2).  General Appearance: Casual  Eye Contact:  Fair  Speech:  Normal Rate  Volume:  Decreased  Mood:  Anxious  Affect:  Constricted  Thought Process:  Goal Directed  Orientation:  Full (Time, Place, and Person)  Thought Content:  Negative  Suicidal Thoughts:  No  Homicidal Thoughts:  No  Memory:  Immediate;   Fair Recent;   Fair Remote;   Fair  Judgement:  Fair  Insight:  Fair  Psychomotor Activity:  Normal  Concentration:  Concentration: Good  Recall:  Fair  Fund of Knowledge:  Fair  Language:  Fair  Akathisia:  No  Handed:  Right  AIMS (if indicated):     Assets:  Desire for Improvement Housing Physical Health  ADL's:  Intact  Cognition:  WNL  Sleep:        Treatment Plan Summary: Patient was severe recurrent depression possible bipolar disorder possible PTSD. Continues to report suicidal ideation. Has had a couple  hospitalizations in a row without any clear benefit and with immediate returns to the hospital. Unlikely that further hospital level treatment will be of help. At this point I have discussed the case with TTS and did that [facility]. Continue current medicine for now.  Disposition: Patient does not meet criteria for psychiatric inpatient admission. Supportive therapy provided about ongoing stressors.  Mordecai RasmussenJohn Clapacs, MD 12/29/2015 5:46 PM

## 2015-12-29 NOTE — ED Notes (Signed)
Patient asked nurse why her gabapentin had been reduced to so low of a dose (300 mg a day from 900 mg TID). Per psychiatrist, medication was lowered in order to reduce sedation.  When this information was relayed, patient said that she was concerned that the decrease in medication was what causes her to be unable to sleep at night. She stated that ibuprofen 800 mg does help the pain at night and helps her sleep. Nurse stated she would alert night staff of this so that she will be able to sleep comfortably. Patient exhibits no signs of acute distress at this time. Maintained on 15 minute checks and observation by security camera for safety.

## 2015-12-29 NOTE — BH Assessment (Signed)
[facility] ([phone]), patient is on the Wait List.

## 2015-12-29 NOTE — ED Notes (Signed)
Patient received breakfast tray 

## 2015-12-30 MED ORDER — IBUPROFEN 800 MG PO TABS
800.0000 mg | ORAL_TABLET | Freq: Three times a day (TID) | ORAL | Status: DC | PRN
  Administered 2015-12-30 – 2016-01-09 (×14): 800 mg via ORAL
  Filled 2015-12-30 (×6): qty 1

## 2015-12-30 NOTE — ED Notes (Signed)
Patient resting quietly in room. No noted distress or abnormal behaviors noted. Will continue 15 minute checks and observation by security camera for safety. 

## 2015-12-30 NOTE — BH Assessment (Signed)
Confirmed with [facility] ([phone]), patient remain on their Waitlist.

## 2015-12-30 NOTE — ED Notes (Signed)
Report given to oncoming shift. No issues to report at this time.Safety maintained. 

## 2015-12-30 NOTE — ED Notes (Signed)

## 2015-12-30 NOTE — Consult Note (Signed)
  Psychiatry: Patient continues to report suicidal ideation. Gets anxious and resistant to the idea of discharge. Slightly better than yesterday. Patient has been judged to be inappropriate for admission to the psychiatry ward. Continues to await referral to [facility]. No change to plan.

## 2015-12-30 NOTE — Progress Notes (Signed)
Patient reports that she has 10/10 pain is unable to sleep and requests Ibuprofen 800mg  . This RN spoke with Dr. Fanny BienQuale who placed order and Iburpofen 800mg  was administered at 0000. Will continue to monitor.

## 2015-12-30 NOTE — ED Notes (Signed)
Patient taking shower and tending to all ADLs. Complaining of pelvic pain secondary to recent fracture.  Maintained on 15 minute checks and observation by security camera for safety.

## 2015-12-30 NOTE — ED Notes (Signed)
Patient asleep in room. No noted distress or abnormal behavior. Will continue 15 minute checks and observation by security cameras for safety. 

## 2015-12-30 NOTE — ED Notes (Signed)
Patient out in dayroom, socializing with peers.

## 2015-12-30 NOTE — ED Provider Notes (Signed)
-----------------------------------------   6:10 AM on 12/30/2015 -----------------------------------------   Blood pressure 116/75, pulse 73, temperature 98.2 F (36.8 C), temperature source Oral, resp. rate 16, height 5\' 8"  (1.727 m), weight 236 lb (107.049 kg), SpO2 100 %.  The patient had no acute events since last update.  Calm and resting throughout the evening after being medicated for pelvic discomfort due to old fractures.  Disposition is pending per Psychiatry/Behavioral Medicine team recommendations.     Sharyn CreamerMark Nefertiti Mohamad, MD 12/30/15 [phone]

## 2015-12-30 NOTE — ED Notes (Signed)
Snack given at this time  

## 2015-12-30 NOTE — ED Notes (Signed)
Patient in dayroom, socializing with a peer at this time.

## 2015-12-30 NOTE — ED Notes (Signed)
Patient awake, alert, and oriented. She states her mood is "bad" and has been unchanged since being in the ED. She is safe in the hospital but says she "will become suicidal on being discharged." Patient aware she is on the waiting list for [facility]. Maintained on all safety checks.

## 2015-12-31 MED ORDER — MIRTAZAPINE 15 MG PO TABS
15.0000 mg | ORAL_TABLET | Freq: Every day | ORAL | Status: DC
  Administered 2015-12-31 – 2016-01-08 (×9): 15 mg via ORAL
  Filled 2015-12-31 (×13): qty 1

## 2015-12-31 MED ORDER — VENLAFAXINE HCL ER 75 MG PO CP24
300.0000 mg | ORAL_CAPSULE | Freq: Every day | ORAL | Status: DC
  Administered 2016-01-01 – 2016-01-09 (×9): 300 mg via ORAL
  Filled 2015-12-31 (×2): qty 4

## 2015-12-31 NOTE — ED Notes (Signed)
Patient in room watching television. No signs of acute distress noted. Maintained on 15 minute checks and observation by security camera for safety.  

## 2015-12-31 NOTE — ED Notes (Signed)
Patient reports that she is still unsafe to be discharged but reports that she "feels better today than yesterday" after speaking with the psychiatrist today.Patient continues to be calm and cooperative with no  signs of distress noted. Maintained on 15 minute checks and observation by security camera for safety.

## 2015-12-31 NOTE — ED Notes (Signed)
Patient currently in dayroom socializing with peers. No signs of acute distress noted at this time. Maintained on 15 minute checks and observation by security camera for safety.

## 2015-12-31 NOTE — ED Notes (Signed)
Patient asleep in room. No noted distress or abnormal behavior. Will continue 15 minute checks and observation by security cameras for safety. 

## 2015-12-31 NOTE — Consult Note (Signed)
[facility] Face-to-Face Psychiatry Consult   Reason for Consult:  Follow-up for this 40 year old woman with a history of mood disorder anxiety disorder and personality disorder who has been in the emergency room over the weekend. Referring Physician:  Sharma Covert Patient Identification: DAGMAR ADCOX MRN:  [id number] Principal Diagnosis: <principal problem not specified> Diagnosis:   Patient Active Problem List   Diagnosis Date Noted  . Alcohol intoxication (HCC) [F10.129]   . Severe episode of recurrent major depressive disorder, without psychotic features (HCC) [F33.2]   . Malingering [Z76.5] 12/16/2015  . Major depressive disorder, recurrent episode, moderate (HCC) [F33.1] 12/12/2015  . Closed pelvic fracture (HCC) [S32.9XXA] 10/31/2015  . Tobacco abuse [Z72.0] 10/31/2015  . Tobacco use disorder [F17.200] 03/28/2015  . PTSD (post-traumatic stress disorder) [F43.10] 03/28/2015  . Stimulant use disorder (cocaine) [F15.90] 03/28/2015  . Sedative, hypnotic or anxiolytic use disorder, severe, dependence (HCC) [F13.20] 03/27/2015  . Opioid use disorder, severe, dependence (HCC) [F11.20] 12/20/2014  . Alcohol use disorder severe. [F10.20] 12/18/2014  . Borderline personality disorder [F60.3] 12/18/2014    Total Time spent with patient: 45 minutes  Subjective:   Sandy Mason is a 40 y.o. female patient admitted with "I'm doing better".  Follow-up note for Saturday, June 3. Patient continues to state suicidal ideation daily. Feels frightened and anxious. Makes frequent statements about how she thinks she is going to kill her self outside the emergency room. Unfortunately this patient does have a history of very serious and nearly fatal suicide attempts in a great deal of impulsivity in the past. Patient is staying mostly withdrawn to her room. She has complaints today that she needs to be on a higher dose of Effexor at 300 mg and to restart mirtazapine at 15 mg at night HPI:  Patient  interviewed. Chart reviewed. Vitals and labs in medicines reviewed. Patient says that her mood is better today. She repeats to me her anger at having been discharged from the hospital in a way that she thought was "too early". She says she was feeling too nervous and agitated at the time she left. After resting in the emergency room over the weekend her mood is improved. She denies any suicidal or homicidal ideation. Denies having any hallucinations. She does say she is still feeling anxious and also complains that she would like to be back on the full 15 mg of mirtazapine that she was taking before. Patient however has no objections to being discharged today.  Social history: Still living with a friend and it sounds like that's the environment she is going back to. She feels fairly confident that it will be safe.  Medical history: Patient has a history of some the past also multiple substance abuse no other active major medical problems right now.  Substance abuse history: History of abuse of multiple substances opiates as well as stimulants in the past.  Past Psychiatric History: Patient has a history of mood instability with the diagnosis variously of bipolar disorder PTSD and borderline personality disorder. Has a tendency to become explosive at times. She has been relatively stable in the emergency room not aggressive or violent. She does have a past history of suicide attempts and aggression however.  Risk to Self: Suicidal Ideation: Yes-Currently Present Suicidal Intent: Yes-Currently Present Is patient at risk for suicide?: Yes Suicidal Plan?: Yes-Currently Present Specify Current Suicidal Plan: Pt reports she walked out in front of a car Access to Means: Yes Specify Access to Suicidal Means: access to traffic What has been  your use of drugs/alcohol within the last 12 months?: alcohol How many times?: 2 Other Self Harm Risks: None identified Triggers for Past Attempts:  Unknown Intentional Self Injurious Behavior: None Risk to Others: Homicidal Ideation: No Thoughts of Harm to Others: No Current Homicidal Intent: No Current Homicidal Plan: No Access to Homicidal Means: No Identified Victim: None identified History of harm to others?: No Assessment of Violence: None Noted Violent Behavior Description: None identified Does patient have access to weapons?: No Criminal Charges Pending?: No Does patient have a court date: No Prior Inpatient Therapy: Prior Inpatient Therapy: Yes Prior Therapy Dates: 11/2015 Prior Therapy Facilty/Provider(s): [facility] Reason for Treatment: PTSD Prior Outpatient Therapy: Prior Outpatient Therapy: Yes Prior Therapy Dates: current Prior Therapy Facilty/Provider(s): [facility] Reason for Treatment: ptsd Does patient have an ACCT team?: No Does patient have Intensive In-House Services?  : No Does patient have [facility] services? : No Does patient have P4CC services?: No  Past Medical History:  Past Medical History  Diagnosis Date  . CVA (cerebral infarction)   . Seizures (HCC)   . Depression   . Anxiety   . Suicide attempt by hanging (HCC)   . Alcohol use disorder severe. 12/18/2014  . 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 Borderline personality disorder 12/18/2014  . Opioid use disorder, severe, dependence (HCC) 12/20/2014  . 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 PTSD (post-traumatic stress disorder) 03/28/2015   History reviewed. No pertinent past surgical history. Family History:  Family History  Problem Relation Age of Onset  . Diabetes Father    Family Psychiatric  History: Family history positive for mood instability Social History:  History  Alcohol Use  . 3.6 oz/week  . 6 Cans of beer per week    Comment: tonight 6      History  Drug Use  . Yes  . Special: Benzodiazepines, Hydrocodone    Social History   Social  History  . Marital Status: Single    Spouse Name: N/A  . Number of Children: N/A  . Years of Education: N/A   Social History Main Topics  . Smoking status: Current Every Day Smoker -- 1.00 packs/day    Types: Cigarettes  . Smokeless tobacco: None  . Alcohol Use: 3.6 oz/week    6 Cans of beer per week     Comment: tonight 6   . Drug Use: Yes    Special: Benzodiazepines, Hydrocodone  . Sexual Activity: Yes    Birth Control/ Protection: IUD   Other Topics Concern  . None   Social History Narrative   Additional Social History:    Allergies:   Allergies  Allergen Reactions  . Depakote [Valproic Acid] Other (See Comments)    Reaction:  Seizures   . Haldol [Haloperidol Lactate] Other (See Comments)    Reaction:  Seizures   . Keflet [Cephalexin] Rash    Labs: No results found for this or any previous visit (from the past 48 hour(s)).  Current Facility-Administered Medications  Medication Dose Route Frequency Provider Last Rate Last Dose  . cyclobenzaprine (FLEXERIL) tablet 5 mg  5 mg Oral TID PRN Loleta Rose, MD   5 mg at 12/30/15 2000  . gabapentin (NEURONTIN) capsule 100 mg  100 mg Oral TID Brandy Hale, MD   100 mg at 12/31/15 0950  . hydrOXYzine (ATARAX/VISTARIL) tablet 25 mg  25 mg Oral QHS Loleta Rose, MD   25 mg at 12/30/15 2110  .  ibuprofen (ADVIL,MOTRIN) tablet 800 mg  800 mg Oral Q8H PRN Sharyn CreamerMark Quale, MD   800 mg at 12/31/15 1505  . lidocaine (LIDODERM) 5 % 2 patch  2 patch Transdermal Q24H Loleta Roseory Forbach, MD   2 patch at 12/31/15 1122  . meloxicam (MOBIC) tablet 7.5 mg  7.5 mg Oral BID WC Loleta Roseory Forbach, MD   7.5 mg at 12/31/15 0951  . mirtazapine (REMERON) tablet 15 mg  15 mg Oral QHS Audery AmelJohn T Phyllip Claw, MD      . prazosin (MINIPRESS) capsule 2 mg  2 mg Oral QHS Loleta Roseory Forbach, MD   2 mg at 12/30/15 2110  . traZODone (DESYREL) tablet 100 mg  100 mg Oral QHS Minna AntisKevin Paduchowski, MD   100 mg at 12/30/15 2110  . [START ON 01/01/2016] venlafaxine XR (EFFEXOR-XR) 24 hr capsule 300 mg   300 mg Oral Q breakfast Audery AmelJohn T Mckenze Slone, MD       Current Outpatient Prescriptions  Medication Sig Dispense Refill  . cyclobenzaprine (FLEXERIL) 5 MG tablet Take 1 tablet (5 mg total) by mouth 3 (three) times daily as needed for muscle spasms. 15 tablet 0  . gabapentin (NEURONTIN) 300 MG capsule Take 3 capsules (900 mg total) by mouth 3 (three) times daily. 120 capsule 0  . hydrOXYzine (VISTARIL) 25 MG capsule Take 1 capsule (25 mg total) by mouth 3 (three) times daily as needed. 30 capsule 0  . lidocaine (LIDODERM) 5 % Place 2 patches onto the skin daily. Remove & Discard patch within 12 hours or as directed by MD 60 patch 0  . meloxicam (MOBIC) 7.5 MG tablet Take 1 tablet (7.5 mg total) by mouth 2 (two) times daily with a meal. 60 tablet 0  . mirtazapine (REMERON) 15 MG tablet Take 1 tablet (15 mg total) by mouth at bedtime. 30 tablet 0  . nitrofurantoin, macrocrystal-monohydrate, (MACROBID) 100 MG capsule Take 1 capsule (100 mg total) by mouth every 12 (twelve) hours. 3 capsule 0  . prazosin (MINIPRESS) 2 MG capsule Take 1 capsule (2 mg total) by mouth at bedtime. 30 capsule 0  . venlafaxine XR (EFFEXOR-XR) 150 MG 24 hr capsule Take 2 capsules (300 mg total) by mouth daily with breakfast. 30 capsule 0  . Vitamin D, Ergocalciferol, (DRISDOL) 50000 units CAPS capsule Take 50,000 Units by mouth once a week.      Musculoskeletal: Strength & Muscle Tone: within normal limits Gait & Station: normal Patient leans: N/A  Psychiatric Specialty Exam: Physical Exam  Nursing note and vitals reviewed. Constitutional: She appears well-developed and well-nourished.  HENT:  Head: Normocephalic and atraumatic.  Eyes: Conjunctivae are normal. Pupils are equal, round, and reactive to light.  Neck: Normal range of motion.  Cardiovascular: Normal rate and normal heart sounds.   Respiratory: Effort normal. No respiratory distress.  GI: Soft.  Musculoskeletal: Normal range of motion.  Neurological: She  is alert.  Skin: Skin is warm and dry.  Psychiatric: Her mood appears anxious. Her speech is delayed. She is slowed and withdrawn. She expresses impulsivity. She exhibits a depressed mood. She expresses suicidal ideation. She expresses suicidal plans.    Review of Systems  Constitutional: Negative.   HENT: Negative.   Eyes: Negative.   Respiratory: Negative.   Cardiovascular: Negative.   Gastrointestinal: Negative.   Musculoskeletal: Negative.   Skin: Negative.   Neurological: Negative.   Psychiatric/Behavioral: Negative for depression, suicidal ideas, hallucinations, memory loss and substance abuse. The patient is nervous/anxious and has insomnia.     Blood  pressure 134/76, pulse 73, temperature 98.2 F (36.8 C), temperature source Oral, resp. rate 16, height 5\' 8"  (1.727 m), weight 107.049 kg (236 lb), SpO2 97 %.Body mass index is 35.89 kg/(m^2).  General Appearance: Casual  Eye Contact:  Fair  Speech:  Normal Rate  Volume:  Decreased  Mood:  Anxious  Affect:  Constricted  Thought Process:  Goal Directed  Orientation:  Full (Time, Place, and Person)  Thought Content:  Negative  Suicidal Thoughts:  Yes.  with intent/plan  Homicidal Thoughts:  No  Memory:  Immediate;   Fair Recent;   Fair Remote;   Fair  Judgement:  Fair  Insight:  Fair  Psychomotor Activity:  Normal  Concentration:  Concentration: Good  Recall:  Fair  Fund of Knowledge:  Fair  Language:  Fair  Akathisia:  No  Handed:  Right  AIMS (if indicated):     Assets:  Desire for Improvement Housing Physical Health  ADL's:  Intact  Cognition:  WNL  Sleep:        Treatment Plan Summary: Patient continues to make statements about being suicidal and to report being severely depressed. Affect does look depressed. Behavior appears withdrawn and depressed although not psychotic. Patient has a history of depression as well as borderline personality disorder. Unlikely to benefit from short-term hospital stay but  appears to not be pulling her self back together as she does in the past. High risk still for self injury. I continue to recommend [facility] the [facility]. I am increasing her Effexor as she requested and restarting the mirtazapine. Supportive counseling completed. Reviewed case with nursing staff.  Disposition: Patient does not meet criteria for psychiatric inpatient admission. Supportive therapy provided about ongoing stressors.  Mordecai Rasmussen, MD 12/31/2015 3:27 PM

## 2015-12-31 NOTE — ED Notes (Signed)
Patient resting quietly in room. No noted distress or abnormal behaviors noted. Will continue 15 minute checks and observation by security camera for safety. 

## 2015-12-31 NOTE — ED Notes (Signed)
Patient currently continues to voice SI and states that she is unsafe to be discharged. She said that she was happy to socialize with peers yesterday but after speaking with the psychiatrist she felt depressed again. She denies HI and AVH. She states that her pain is at a 10/10 but she refuses the lidocaine patch at this time. Patient continues to be calm and cooperative and is agreeable to the plan to [facility]. No signs of acute distress noted. Maintained on 15 minute checks and observation by security camera for safety.

## 2015-12-31 NOTE — ED Notes (Signed)
Patient in dayroom watching television. No signs of acute distress noted at this time. Maintained on 15 minute checks and observation by security camera for safety.  

## 2015-12-31 NOTE — ED Notes (Addendum)
Patient remains in dayroom watching television. No signs of acute distress noted at this time. Maintained on 15 minute checks and observation by security camera for safety.

## 2015-12-31 NOTE — ED Notes (Signed)
ENVIRONMENTAL ASSESSMENT Potentially harmful objects out of patient reach: Yes Personal belongings secured: Yes Patient dressed in hospital provided attire only: Yes Plastic bags out of patient reach: Yes Patient care equipment (cords, cables, call bells, lines, and drains) shortened, removed, or accounted for: Yes Equipment and supplies removed from bottom of stretcher: Yes Potentially toxic materials out of patient reach: Yes Sharps container removed or out of patient reach: Yes  Patient in room sleeping. No sign of distress noted. Maintained on 15 minute checks and observation by security camera for safety.

## 2015-12-31 NOTE — ED Notes (Signed)
Graham crackers and peanut butter and a sprite provided at this time per patient's request

## 2015-12-31 NOTE — ED Notes (Signed)
Patient states that at the time she feels much better and has a brighter mood than she has previously. She says that she would like to come into the dayroom and socialize more with peers. Patient is calm and cooperative, no signs of acute distress at this time. Maintained on 15 minute checks and observation by security camera for safety.

## 2015-12-31 NOTE — BH Assessment (Signed)
Confirmed with [facility] ([phone]), patient remain on their wait list. Nothing else needed at this time.  Due to the wait list changing throughout the day, she was unable to share where she was at on the list. "If a patient get prioritize (due to violent behaviors or aggression), they get moved up, in front of the other ones."

## 2016-01-01 MED ORDER — CYCLOBENZAPRINE HCL 10 MG PO TABS
ORAL_TABLET | ORAL | Status: AC
  Administered 2016-01-01: 5 mg via ORAL
  Filled 2016-01-01: qty 1

## 2016-01-01 MED ORDER — IBUPROFEN 800 MG PO TABS
ORAL_TABLET | ORAL | Status: AC
  Administered 2016-01-01: 800 mg via ORAL
  Filled 2016-01-01: qty 1

## 2016-01-01 MED ORDER — GABAPENTIN 100 MG PO CAPS
ORAL_CAPSULE | ORAL | Status: AC
  Administered 2016-01-01: 100 mg via ORAL
  Filled 2016-01-01: qty 1

## 2016-01-01 MED ORDER — HYDROXYZINE HCL 25 MG PO TABS
ORAL_TABLET | ORAL | Status: AC
  Administered 2016-01-01: 25 mg via ORAL
  Filled 2016-01-01: qty 1

## 2016-01-01 MED ORDER — VENLAFAXINE HCL ER 75 MG PO CP24
ORAL_CAPSULE | ORAL | Status: AC
  Administered 2016-01-01: 300 mg via ORAL
  Filled 2016-01-01: qty 4

## 2016-01-01 MED ORDER — MELOXICAM 7.5 MG PO TABS
ORAL_TABLET | ORAL | Status: AC
  Administered 2016-01-01: 7.5 mg via ORAL
  Filled 2016-01-01: qty 1

## 2016-01-01 MED ORDER — TRAZODONE HCL 100 MG PO TABS
ORAL_TABLET | ORAL | Status: AC
  Administered 2016-01-01: 100 mg via ORAL
  Filled 2016-01-01: qty 1

## 2016-01-01 MED ORDER — PRAZOSIN HCL 1 MG PO CAPS
ORAL_CAPSULE | ORAL | Status: AC
  Administered 2016-01-01: 2 mg via ORAL
  Filled 2016-01-01: qty 1

## 2016-01-01 NOTE — ED Notes (Signed)
Pt. Alert and oriented, warm and dry, in no distress. Pt. Denies SI, HI, and AVH. Pt. Encouraged to let nursing staff know of any concerns or needs. 

## 2016-01-01 NOTE — BH Assessment (Signed)
Confirmed with [facility] ([phone]), patient remain on their wait list. Nothing else needed at this time.

## 2016-01-01 NOTE — ED Notes (Signed)

## 2016-01-01 NOTE — ED Notes (Signed)

## 2016-01-01 NOTE — ED Notes (Signed)

## 2016-01-01 NOTE — ED Notes (Signed)
Patient asleep in room. No noted distress or abnormal behavior. Will continue 15 minute checks and observation by security cameras for safety. 

## 2016-01-01 NOTE — ED Notes (Signed)
Patient just awakening. She has no specific complaints. She was encouraged to take a shower. Maintained on all safety checks.

## 2016-01-01 NOTE — ED Provider Notes (Signed)
-----------------------------------------   8:40 AM on 01/01/2016 -----------------------------------------   Blood pressure 111/69, pulse 69, temperature 98.3 F (36.8 C), temperature source Tympanic, resp. rate 16, height 5\' 8"  (1.727 m), weight 236 lb (107.049 kg), SpO2 97 %.  The patient had no acute events since last update.  Calm and cooperative at this time.    [facility].     Rebecka ApleyAllison P Donte Kary, MD 01/01/16 [phone]

## 2016-01-01 NOTE — ED Notes (Signed)
Patient continues to stay in bed. She refused offer of shower.

## 2016-01-01 NOTE — ED Notes (Signed)
Patient states on a scale of 0-10 her depression is an "8." She reports intermittent thoughts of "self destructing", thinking of drinking or drugging to death. No reports of psychotic symptoms.  Appetite good at meals. Maintained on 15 minute checks and observation by security camera for safety.

## 2016-01-01 NOTE — ED Notes (Signed)
Patient resting quietly in room. No noted distress or abnormal behaviors noted. Will continue 15 minute checks and observation by security camera for safety. 

## 2016-01-01 NOTE — ED Notes (Signed)
Patient continues to be calm and cooperative with no signs of distress noted. Maintained on 15 minute checks and observation by security camera for safety.

## 2016-01-01 NOTE — Consult Note (Signed)
[facility] Face-to-Face Psychiatry Consult   Reason for Consult:  Follow-up for this 40 year old woman with a history of mood disorder anxiety disorder and personality disorder who has been in the emergency room over the weekend. Referring Physician:  Sharma Covert Patient Identification: Sandy Mason MRN:  [id number] Principal Diagnosis: Major depressive disorder, recurrent episode, moderate (HCC) Diagnosis:   Patient Active Problem List   Diagnosis Date Noted  . Alcohol intoxication (HCC) [F10.129]   . Severe episode of recurrent major depressive disorder, without psychotic features (HCC) [F33.2]   . Malingering [Z76.5] 12/16/2015  . Major depressive disorder, recurrent episode, moderate (HCC) [F33.1] 12/12/2015  . Closed pelvic fracture (HCC) [S32.9XXA] 10/31/2015  . Tobacco abuse [Z72.0] 10/31/2015  . Tobacco use disorder [F17.200] 03/28/2015  . PTSD (post-traumatic stress disorder) [F43.10] 03/28/2015  . Stimulant use disorder (cocaine) [F15.90] 03/28/2015  . Sedative, hypnotic or anxiolytic use disorder, severe, dependence (HCC) [F13.20] 03/27/2015  . Opioid use disorder, severe, dependence (HCC) [F11.20] 12/20/2014  . Alcohol use disorder severe. [F10.20] 12/18/2014  . Borderline personality disorder [F60.3] 12/18/2014    Total Time spent with patient: 45 minutes  Subjective:   Sandy Mason is a 40 y.o. female patient admitted with "I'm doing better".   Follow-up for Sunday, June 4. Patient interviewed. Patient says she is still feeling very depressed. Still has suicidal thoughts. Feels very hopeless. Feels very uncomfortable with being outside the hospital. Does sleep a little bit better since taking the mirtazapine. Denies psychotic symptoms. Patient rarely gets out of bed rarely interacts with others. HPI:  Patient interviewed. Chart reviewed. Vitals and labs in medicines reviewed. Patient says that her mood is better today. She repeats to me her anger at having been discharged  from the hospital in a way that she thought was "too early". She says she was feeling too nervous and agitated at the time she left. After resting in the emergency room over the weekend her mood is improved. She denies any suicidal or homicidal ideation. Denies having any hallucinations. She does say she is still feeling anxious and also complains that she would like to be back on the full 15 mg of mirtazapine that she was taking before. Patient however has no objections to being discharged today.  Social history: Still living with a friend and it sounds like that's the environment she is going back to. She feels fairly confident that it will be safe.  Medical history: Patient has a history of some the past also multiple substance abuse no other active major medical problems right now.  Substance abuse history: History of abuse of multiple substances opiates as well as stimulants in the past.  Past Psychiatric History: Patient has a history of mood instability with the diagnosis variously of bipolar disorder PTSD and borderline personality disorder. Has a tendency to become explosive at times. She has been relatively stable in the emergency room not aggressive or violent. She does have a past history of suicide attempts and aggression however.  Risk to Self: Suicidal Ideation: Yes-Currently Present Suicidal Intent: Yes-Currently Present Is patient at risk for suicide?: Yes Suicidal Plan?: Yes-Currently Present Specify Current Suicidal Plan: Pt reports she walked out in front of a car Access to Means: Yes Specify Access to Suicidal Means: access to traffic What has been your use of drugs/alcohol within the last 12 months?: alcohol How many times?: 2 Other Self Harm Risks: None identified Triggers for Past Attempts: Unknown Intentional Self Injurious Behavior: None Risk to Others: Homicidal Ideation: No Thoughts  of Harm to Others: No Current Homicidal Intent: No Current Homicidal Plan:  No Access to Homicidal Means: No Identified Victim: None identified History of harm to others?: No Assessment of Violence: None Noted Violent Behavior Description: None identified Does patient have access to weapons?: No Criminal Charges Pending?: No Does patient have a court date: No Prior Inpatient Therapy: Prior Inpatient Therapy: Yes Prior Therapy Dates: 11/2015 Prior Therapy Facilty/Provider(s): [facility] Reason for Treatment: PTSD Prior Outpatient Therapy: Prior Outpatient Therapy: Yes Prior Therapy Dates: current Prior Therapy Facilty/Provider(s): [facility] Reason for Treatment: ptsd Does patient have an ACCT team?: No Does patient have Intensive In-House Services?  : No Does patient have [facility] services? : No Does patient have P4CC services?: No  Past Medical History:  Past Medical History  Diagnosis Date  . CVA (cerebral infarction)   . Seizures (HCC)   . Depression   . Anxiety   . Suicide attempt by hanging (HCC)   . Alcohol use disorder severe. 12/18/2014  . 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 Borderline personality disorder 12/18/2014  . Opioid use disorder, severe, dependence (HCC) 12/20/2014  . 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 PTSD (post-traumatic stress disorder) 03/28/2015   History reviewed. No pertinent past surgical history. Family History:  Family History  Problem Relation Age of Onset  . Diabetes Father    Family Psychiatric  History: Family history positive for mood instability Social History:  History  Alcohol Use  . 3.6 oz/week  . 6 Cans of beer per week    Comment: tonight 6      History  Drug Use  . Yes  . Special: Benzodiazepines, Hydrocodone    Social History   Social History  . Marital Status: Single    Spouse Name: N/A  . Number of Children: N/A  . Years of Education: N/A   Social History Main Topics  . Smoking status: Current Every Day  Smoker -- 1.00 packs/day    Types: Cigarettes  . Smokeless tobacco: None  . Alcohol Use: 3.6 oz/week    6 Cans of beer per week     Comment: tonight 6   . Drug Use: Yes    Special: Benzodiazepines, Hydrocodone  . Sexual Activity: Yes    Birth Control/ Protection: IUD   Other Topics Concern  . None   Social History Narrative   Additional Social History:    Allergies:   Allergies  Allergen Reactions  . Depakote [Valproic Acid] Other (See Comments)    Reaction:  Seizures   . Haldol [Haloperidol Lactate] Other (See Comments)    Reaction:  Seizures   . Keflet [Cephalexin] Rash    Labs: No results found for this or any previous visit (from the past 48 hour(s)).  Current Facility-Administered Medications  Medication Dose Route Frequency Provider Last Rate Last Dose  . cyclobenzaprine (FLEXERIL) tablet 5 mg  5 mg Oral TID PRN Loleta Rose, MD   5 mg at 12/31/15 1957  . gabapentin (NEURONTIN) capsule 100 mg  100 mg Oral TID Brandy Hale, MD   100 mg at 01/01/16 1700  . hydrOXYzine (ATARAX/VISTARIL) tablet 25 mg  25 mg Oral QHS Loleta Rose, MD   25 mg at 12/31/15 2237  . ibuprofen (ADVIL,MOTRIN) tablet 800 mg  800 mg Oral Q8H PRN Sharyn Creamer, MD   800 mg at 01/01/16 1420  . lidocaine (LIDODERM) 5 % 2 patch  2 patch Transdermal Q24H Loleta Rose,  MD   2 patch at 01/01/16 0641  . meloxicam (MOBIC) tablet 7.5 mg  7.5 mg Oral BID WC Loleta Roseory Forbach, MD   7.5 mg at 01/01/16 1700  . mirtazapine (REMERON) tablet 15 mg  15 mg Oral QHS Audery AmelJohn T Clapacs, MD   15 mg at 12/31/15 2323  . prazosin (MINIPRESS) capsule 2 mg  2 mg Oral QHS Loleta Roseory Forbach, MD   2 mg at 12/31/15 2237  . traZODone (DESYREL) tablet 100 mg  100 mg Oral QHS Minna AntisKevin Paduchowski, MD   100 mg at 12/31/15 2237  . venlafaxine XR (EFFEXOR-XR) 24 hr capsule 300 mg  300 mg Oral Q breakfast Audery AmelJohn T Clapacs, MD   300 mg at 01/01/16 [id number]   Current Outpatient Prescriptions  Medication Sig Dispense Refill  . cyclobenzaprine (FLEXERIL) 5 MG  tablet Take 1 tablet (5 mg total) by mouth 3 (three) times daily as needed for muscle spasms. 15 tablet 0  . gabapentin (NEURONTIN) 300 MG capsule Take 3 capsules (900 mg total) by mouth 3 (three) times daily. 120 capsule 0  . hydrOXYzine (VISTARIL) 25 MG capsule Take 1 capsule (25 mg total) by mouth 3 (three) times daily as needed. 30 capsule 0  . lidocaine (LIDODERM) 5 % Place 2 patches onto the skin daily. Remove & Discard patch within 12 hours or as directed by MD 60 patch 0  . meloxicam (MOBIC) 7.5 MG tablet Take 1 tablet (7.5 mg total) by mouth 2 (two) times daily with a meal. 60 tablet 0  . mirtazapine (REMERON) 15 MG tablet Take 1 tablet (15 mg total) by mouth at bedtime. 30 tablet 0  . nitrofurantoin, macrocrystal-monohydrate, (MACROBID) 100 MG capsule Take 1 capsule (100 mg total) by mouth every 12 (twelve) hours. 3 capsule 0  . prazosin (MINIPRESS) 2 MG capsule Take 1 capsule (2 mg total) by mouth at bedtime. 30 capsule 0  . venlafaxine XR (EFFEXOR-XR) 150 MG 24 hr capsule Take 2 capsules (300 mg total) by mouth daily with breakfast. 30 capsule 0    Musculoskeletal: Strength & Muscle Tone: within normal limits Gait & Station: normal Patient leans: N/A  Psychiatric Specialty Exam: Physical Exam  Nursing note and vitals reviewed. Constitutional: She appears well-developed and well-nourished.  HENT:  Head: Normocephalic and atraumatic.  Eyes: Conjunctivae are normal. Pupils are equal, round, and reactive to light.  Neck: Normal range of motion.  Cardiovascular: Normal rate and normal heart sounds.   Respiratory: Effort normal. No respiratory distress.  GI: Soft.  Musculoskeletal: Normal range of motion.  Neurological: She is alert.  Skin: Skin is warm and dry.  Psychiatric: Her mood appears anxious. Her speech is delayed. She is slowed and withdrawn. She expresses impulsivity. She exhibits a depressed mood. She expresses suicidal ideation. She expresses suicidal plans.     Review of Systems  Constitutional: Negative.   HENT: Negative.   Eyes: Negative.   Respiratory: Negative.   Cardiovascular: Negative.   Gastrointestinal: Negative.   Musculoskeletal: Negative.   Skin: Negative.   Neurological: Negative.   Psychiatric/Behavioral: Negative for depression, suicidal ideas, hallucinations, memory loss and substance abuse. The patient is nervous/anxious and has insomnia.     Blood pressure 113/55, pulse 68, temperature 98.3 F (36.8 C), temperature source Tympanic, resp. rate 16, height 5\' 8"  (1.727 m), weight 107.049 kg (236 lb), SpO2 98 %.Body mass index is 35.89 kg/(m^2).  General Appearance: Casual  Eye Contact:  Fair  Speech:  Normal Rate  Volume:  Decreased  Mood:  Anxious  Affect:  Constricted  Thought Process:  Goal Directed  Orientation:  Full (Time, Place, and Person)  Thought Content:  Negative  Suicidal Thoughts:  Yes.  with intent/plan  Homicidal Thoughts:  No  Memory:  Immediate;   Fair Recent;   Fair Remote;   Fair  Judgement:  Fair  Insight:  Fair  Psychomotor Activity:  Normal  Concentration:  Concentration: Good  Recall:  Fair  Fund of Knowledge:  Fair  Language:  Fair  Akathisia:  No  Handed:  Right  AIMS (if indicated):     Assets:  Desire for Improvement Housing Physical Health  ADL's:  Intact  Cognition:  WNL  Sleep:        Treatment Plan Summary: Patient continues to make statements about being suicidal. Very gamy. Also appears depressed. Not obviously psychotic. Patient has had multiple attempts at discharge with 3 turns to the emergency room. Very resistant to treatment. Patient is on the list for referral to [facility] at this point. Continue current medicine. Tolerated changes from yesterday fine. Disposition: Patient does not meet criteria for psychiatric inpatient admission. Supportive therapy provided about ongoing stressors.  Mordecai Rasmussen, MD 01/01/2016 5:54 PM

## 2016-01-02 MED ORDER — GABAPENTIN 100 MG PO CAPS
ORAL_CAPSULE | ORAL | Status: AC
  Administered 2016-01-02: 100 mg via ORAL
  Filled 2016-01-02: qty 1

## 2016-01-02 MED ORDER — PRAZOSIN HCL 2 MG PO CAPS
ORAL_CAPSULE | ORAL | Status: AC
  Administered 2016-01-02: 2 mg via ORAL
  Filled 2016-01-02: qty 1

## 2016-01-02 MED ORDER — VENLAFAXINE HCL ER 75 MG PO CP24
ORAL_CAPSULE | ORAL | Status: AC
  Administered 2016-01-02: 300 mg via ORAL
  Filled 2016-01-02: qty 4

## 2016-01-02 MED ORDER — TRAZODONE HCL 100 MG PO TABS
ORAL_TABLET | ORAL | Status: AC
  Administered 2016-01-02: 100 mg via ORAL
  Filled 2016-01-02: qty 1

## 2016-01-02 MED ORDER — IBUPROFEN 800 MG PO TABS
ORAL_TABLET | ORAL | Status: AC
  Administered 2016-01-03: 800 mg via ORAL
  Filled 2016-01-02: qty 1

## 2016-01-02 MED ORDER — HYDROXYZINE HCL 25 MG PO TABS
ORAL_TABLET | ORAL | Status: AC
  Administered 2016-01-02: 25 mg via ORAL
  Filled 2016-01-02: qty 1

## 2016-01-02 MED ORDER — MELOXICAM 7.5 MG PO TABS
ORAL_TABLET | ORAL | Status: AC
  Administered 2016-01-02: 7.5 mg via ORAL
  Filled 2016-01-02: qty 1

## 2016-01-02 MED ORDER — CYCLOBENZAPRINE HCL 10 MG PO TABS
ORAL_TABLET | ORAL | Status: AC
  Administered 2016-01-02: 5 mg via ORAL
  Filled 2016-01-02: qty 1

## 2016-01-02 NOTE — Consult Note (Signed)
[facility] Face-to-Face Psychiatry Consult   Reason for Consult:  Follow-up for this 40 year old woman with a history of mood disorder anxiety disorder and personality disorder who has been in the emergency room over the weekend. Referring Physician:  Sharma Covert Patient Identification: Sandy Mason MRN:  [id number] Principal Diagnosis: Major depressive disorder, recurrent episode, moderate (HCC) Diagnosis:   Patient Active Problem List   Diagnosis Date Noted  . Alcohol intoxication (HCC) [F10.129]   . Severe episode of recurrent major depressive disorder, without psychotic features (HCC) [F33.2]   . Malingering [Z76.5] 12/16/2015  . Major depressive disorder, recurrent episode, moderate (HCC) [F33.1] 12/12/2015  . Closed pelvic fracture (HCC) [S32.9XXA] 10/31/2015  . Tobacco abuse [Z72.0] 10/31/2015  . Tobacco use disorder [F17.200] 03/28/2015  . PTSD (post-traumatic stress disorder) [F43.10] 03/28/2015  . Stimulant use disorder (cocaine) [F15.90] 03/28/2015  . Sedative, hypnotic or anxiolytic use disorder, severe, dependence (HCC) [F13.20] 03/27/2015  . Opioid use disorder, severe, dependence (HCC) [F11.20] 12/20/2014  . Alcohol use disorder severe. [F10.20] 12/18/2014  . Borderline personality disorder [F60.3] 12/18/2014    Total Time spent with patient: 20 minutes  Subjective:   Sandy Mason is a 40 y.o. female patient admitted with "I'm doing better".   Follow-up Monday the fifth. Patient continues to be depressed. Continues to talk about suicide a lot. Affect dysphoric. Hopeless and unengaged in any kind of positive plan for the future. Not obviously psychotic. Very little activity. Physically appears stable.  HPI:  Patient interviewed. Chart reviewed. Vitals and labs in medicines reviewed. Patient says that her mood is better today. She repeats to me her anger at having been discharged from the hospital in a way that she thought was "too early". She says she was feeling too  nervous and agitated at the time she left. After resting in the emergency room over the weekend her mood is improved. She denies any suicidal or homicidal ideation. Denies having any hallucinations. She does say she is still feeling anxious and also complains that she would like to be back on the full 15 mg of mirtazapine that she was taking before. Patient however has no objections to being discharged today.  Social history: Still living with a friend and it sounds like that's the environment she is going back to. She feels fairly confident that it will be safe.  Medical history: Patient has a history of some the past also multiple substance abuse no other active major medical problems right now.  Substance abuse history: History of abuse of multiple substances opiates as well as stimulants in the past.  Past Psychiatric History: Patient has a history of mood instability with the diagnosis variously of bipolar disorder PTSD and borderline personality disorder. Has a tendency to become explosive at times. She has been relatively stable in the emergency room not aggressive or violent. She does have a past history of suicide attempts and aggression however.  Risk to Self: Suicidal Ideation: Yes-Currently Present Suicidal Intent: Yes-Currently Present Is patient at risk for suicide?: Yes Suicidal Plan?: Yes-Currently Present Specify Current Suicidal Plan: Pt reports she walked out in front of a car Access to Means: Yes Specify Access to Suicidal Means: access to traffic What has been your use of drugs/alcohol within the last 12 months?: alcohol How many times?: 2 Other Self Harm Risks: None identified Triggers for Past Attempts: Unknown Intentional Self Injurious Behavior: None Risk to Others: Homicidal Ideation: No Thoughts of Harm to Others: No Current Homicidal Intent: No Current Homicidal Plan: No  Access to Homicidal Means: No Identified Victim: None identified History of harm to  others?: No Assessment of Violence: None Noted Violent Behavior Description: None identified Does patient have access to weapons?: No Criminal Charges Pending?: No Does patient have a court date: No Prior Inpatient Therapy: Prior Inpatient Therapy: Yes Prior Therapy Dates: 11/2015 Prior Therapy Facilty/Provider(s): [facility] Reason for Treatment: PTSD Prior Outpatient Therapy: Prior Outpatient Therapy: Yes Prior Therapy Dates: current Prior Therapy Facilty/Provider(s): [facility] Reason for Treatment: ptsd Does patient have an ACCT team?: No Does patient have Intensive In-House Services?  : No Does patient have [facility] services? : No Does patient have P4CC services?: No  Past Medical History:  Past Medical History  Diagnosis Date  . CVA (cerebral infarction)   . Seizures (HCC)   . Depression   . Anxiety   . Suicide attempt by hanging (HCC)   . Alcohol use disorder severe. 12/18/2014  . 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 Borderline personality disorder 12/18/2014  . Opioid use disorder, severe, dependence (HCC) 12/20/2014  . 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 PTSD (post-traumatic stress disorder) 03/28/2015   History reviewed. No pertinent past surgical history. Family History:  Family History  Problem Relation Age of Onset  . Diabetes Father    Family Psychiatric  History: Family history positive for mood instability Social History:  History  Alcohol Use  . 3.6 oz/week  . 6 Cans of beer per week    Comment: tonight 6      History  Drug Use  . Yes  . Special: Benzodiazepines, Hydrocodone    Social History   Social History  . Marital Status: Single    Spouse Name: N/A  . Number of Children: N/A  . Years of Education: N/A   Social History Main Topics  . Smoking status: Current Every Day Smoker -- 1.00 packs/day    Types: Cigarettes  . Smokeless tobacco: None  . Alcohol  Use: 3.6 oz/week    6 Cans of beer per week     Comment: tonight 6   . Drug Use: Yes    Special: Benzodiazepines, Hydrocodone  . Sexual Activity: Yes    Birth Control/ Protection: IUD   Other Topics Concern  . None   Social History Narrative   Additional Social History:    Allergies:   Allergies  Allergen Reactions  . Depakote [Valproic Acid] Other (See Comments)    Reaction:  Seizures   . Haldol [Haloperidol Lactate] Other (See Comments)    Reaction:  Seizures   . Keflet [Cephalexin] Rash    Labs: No results found for this or any previous visit (from the past 48 hour(s)).  Current Facility-Administered Medications  Medication Dose Route Frequency Provider Last Rate Last Dose  . cyclobenzaprine (FLEXERIL) tablet 5 mg  5 mg Oral TID PRN Loleta Rose, MD   5 mg at 01/01/16 2136  . gabapentin (NEURONTIN) capsule 100 mg  100 mg Oral TID Brandy Hale, MD   100 mg at 01/02/16 1621  . hydrOXYzine (ATARAX/VISTARIL) tablet 25 mg  25 mg Oral QHS Loleta Rose, MD   25 mg at 01/01/16 2132  . ibuprofen (ADVIL,MOTRIN) 800 MG tablet           . ibuprofen (ADVIL,MOTRIN) tablet 800 mg  800 mg Oral Q8H PRN Sharyn Creamer, MD   800 mg at 01/02/16 1654  . lidocaine (LIDODERM) 5 % 2 patch  2 patch Transdermal  Q24H Loleta Roseory Forbach, MD   2 patch at 01/01/16 [phone]  . meloxicam (MOBIC) tablet 7.5 mg  7.5 mg Oral BID WC Loleta Roseory Forbach, MD   7.5 mg at 01/02/16 1621  . mirtazapine (REMERON) tablet 15 mg  15 mg Oral QHS Audery AmelJohn T Clapacs, MD   15 mg at 01/01/16 2132  . prazosin (MINIPRESS) capsule 2 mg  2 mg Oral QHS Loleta Roseory Forbach, MD   2 mg at 01/01/16 2133  . traZODone (DESYREL) tablet 100 mg  100 mg Oral QHS Minna AntisKevin Paduchowski, MD   100 mg at 01/01/16 2134  . venlafaxine XR (EFFEXOR-XR) 24 hr capsule 300 mg  300 mg Oral Q breakfast Audery AmelJohn T Clapacs, MD   300 mg at 01/02/16 [id number]   Current Outpatient Prescriptions  Medication Sig Dispense Refill  . cyclobenzaprine (FLEXERIL) 5 MG tablet Take 1 tablet (5 mg total) by  mouth 3 (three) times daily as needed for muscle spasms. 15 tablet 0  . gabapentin (NEURONTIN) 300 MG capsule Take 3 capsules (900 mg total) by mouth 3 (three) times daily. 120 capsule 0  . hydrOXYzine (VISTARIL) 25 MG capsule Take 1 capsule (25 mg total) by mouth 3 (three) times daily as needed. 30 capsule 0  . lidocaine (LIDODERM) 5 % Place 2 patches onto the skin daily. Remove & Discard patch within 12 hours or as directed by MD 60 patch 0  . meloxicam (MOBIC) 7.5 MG tablet Take 1 tablet (7.5 mg total) by mouth 2 (two) times daily with a meal. 60 tablet 0  . mirtazapine (REMERON) 15 MG tablet Take 1 tablet (15 mg total) by mouth at bedtime. 30 tablet 0  . nitrofurantoin, macrocrystal-monohydrate, (MACROBID) 100 MG capsule Take 1 capsule (100 mg total) by mouth every 12 (twelve) hours. 3 capsule 0  . prazosin (MINIPRESS) 2 MG capsule Take 1 capsule (2 mg total) by mouth at bedtime. 30 capsule 0  . venlafaxine XR (EFFEXOR-XR) 150 MG 24 hr capsule Take 2 capsules (300 mg total) by mouth daily with breakfast. 30 capsule 0    Musculoskeletal: Strength & Muscle Tone: within normal limits Gait & Station: normal Patient leans: N/A  Psychiatric Specialty Exam: Physical Exam  Nursing note and vitals reviewed. Constitutional: She appears well-developed and well-nourished.  HENT:  Head: Normocephalic and atraumatic.  Eyes: Conjunctivae are normal. Pupils are equal, round, and reactive to light.  Neck: Normal range of motion.  Cardiovascular: Normal rate and normal heart sounds.   Respiratory: Effort normal. No respiratory distress.  GI: Soft.  Musculoskeletal: Normal range of motion.  Neurological: She is alert.  Skin: Skin is warm and dry.  Psychiatric: Her mood appears anxious. Her speech is delayed. She is slowed and withdrawn. She expresses impulsivity. She exhibits a depressed mood. She expresses suicidal ideation. She expresses suicidal plans.    Review of Systems  Constitutional:  Negative.   HENT: Negative.   Eyes: Negative.   Respiratory: Negative.   Cardiovascular: Negative.   Gastrointestinal: Negative.   Musculoskeletal: Negative.   Skin: Negative.   Neurological: Negative.   Psychiatric/Behavioral: Negative for depression, suicidal ideas, hallucinations, memory loss and substance abuse. The patient is nervous/anxious and has insomnia.     Blood pressure 113/76, pulse 71, temperature 97.6 F (36.4 C), temperature source Oral, resp. rate 16, height 5\' 8"  (1.727 m), weight 107.049 kg (236 lb), SpO2 98 %.Body mass index is 35.89 kg/(m^2).  General Appearance: Casual  Eye Contact:  Fair  Speech:  Normal Rate  Volume:  Decreased  Mood:  Anxious  Affect:  Constricted  Thought Process:  Goal Directed  Orientation:  Full (Time, Place, and Person)  Thought Content:  Negative  Suicidal Thoughts:  Yes.  with intent/plan  Homicidal Thoughts:  No  Memory:  Immediate;   Fair Recent;   Fair Remote;   Fair  Judgement:  Fair  Insight:  Fair  Psychomotor Activity:  Normal  Concentration:  Concentration: Good  Recall:  Fair  Fund of Knowledge:  Fair  Language:  Fair  Akathisia:  No  Handed:  Right  AIMS (if indicated):     Assets:  Desire for Improvement Housing Physical Health  ADL's:  Intact  Cognition:  WNL  Sleep:        Treatment Plan Summary: Ongoing suicidality and impulsivity. No change to treatment plan [facility]. Continues to be inappropriate for treatment downstairs because of lack of benefit from shorter term hospitalization. Disposition: Patient does not meet criteria for psychiatric inpatient admission. Supportive therapy provided about ongoing stressors.  Mordecai Rasmussen, MD 01/02/2016 6:36 PM

## 2016-01-02 NOTE — ED Notes (Signed)
Pt requested pain medication for pain 9/10 in lower back. Medication administered as ordered per MAR.   Pt reports more depression today than previous days. Pt is frustrated, "I wish I could be normal."  Pt states she still thinks of ways to kill herself including a gun or drug overdose.  Pt still does not feel safe to leave the hospital and re enter the world.   No distress noted. Maintained on 15 minute checks and observation by security camera for safety.

## 2016-01-02 NOTE — ED Notes (Signed)

## 2016-01-02 NOTE — ED Notes (Signed)
Patient asleep in room. No noted distress or abnormal behavior. Will continue 15 minute checks and observation by security cameras for safety. 

## 2016-01-02 NOTE — ED Notes (Signed)
Pt. Alert and oriented, warm and dry, in no distress. Pt. Denies SI, HI, and AVH. Pt states she feels like she is feeling more depressed today. Pt. Encouraged to let nursing staff know of any concerns or needs.

## 2016-01-02 NOTE — ED Notes (Signed)

## 2016-01-02 NOTE — ED Provider Notes (Signed)
-----------------------------------------   6:59 AM on 01/02/2016 -----------------------------------------   Blood pressure 104/59, pulse 64, temperature 97.6 F (36.4 C), temperature source Oral, resp. rate 16, height 5\' 8"  (1.727 m), weight 236 lb (107.049 kg), SpO2 99 %.  The patient had no acute events since last update.  Calm and cooperative at this time.  Disposition is pending per Psychiatry/Behavioral Medicine team recommendations.     Irean HongJade J Koleen Celia, MD 01/02/16 [phone]

## 2016-01-03 MED ORDER — MELOXICAM 7.5 MG PO TABS
ORAL_TABLET | ORAL | Status: AC
  Administered 2016-01-03: 7.5 mg via ORAL
  Filled 2016-01-03: qty 1

## 2016-01-03 MED ORDER — CYCLOBENZAPRINE HCL 10 MG PO TABS
ORAL_TABLET | ORAL | Status: AC
  Administered 2016-01-03: 5 mg via ORAL
  Filled 2016-01-03: qty 1

## 2016-01-03 MED ORDER — GABAPENTIN 100 MG PO CAPS
ORAL_CAPSULE | ORAL | Status: AC
  Administered 2016-01-03: 100 mg via ORAL
  Filled 2016-01-03: qty 1

## 2016-01-03 MED ORDER — TRAZODONE HCL 100 MG PO TABS
ORAL_TABLET | ORAL | Status: AC
  Administered 2016-01-03: 100 mg via ORAL
  Filled 2016-01-03: qty 1

## 2016-01-03 MED ORDER — HYDROXYZINE HCL 25 MG PO TABS
ORAL_TABLET | ORAL | Status: AC
  Administered 2016-01-03: 25 mg via ORAL
  Filled 2016-01-03: qty 1

## 2016-01-03 MED ORDER — VENLAFAXINE HCL ER 75 MG PO CP24
ORAL_CAPSULE | ORAL | Status: AC
  Administered 2016-01-03: 300 mg via ORAL
  Filled 2016-01-03: qty 4

## 2016-01-03 MED ORDER — PRAZOSIN HCL 2 MG PO CAPS
ORAL_CAPSULE | ORAL | Status: AC
  Administered 2016-01-03: 2 mg via ORAL
  Filled 2016-01-03: qty 1

## 2016-01-03 MED ORDER — IBUPROFEN 800 MG PO TABS
ORAL_TABLET | ORAL | Status: AC
  Administered 2016-01-04: 800 mg via ORAL
  Filled 2016-01-03: qty 1

## 2016-01-03 NOTE — ED Notes (Signed)
Patient resting quietly in room. No noted distress or abnormal behaviors noted. Will continue 15 minute checks and observation by security camera for safety. 

## 2016-01-03 NOTE — ED Provider Notes (Signed)
Filed Vitals:   01/02/16 2029 01/03/16 0621  BP:  101/69  Pulse: 69 87  Temp: 98.6 F (37 C) 98.1 F (36.7 C)  Resp: 16 16   No acute events overnight.  Patient is on IVC awaiting inpatient psychiatric admission/transfer disposition.  Governor Rooksebecca Kishaun Erekson, MD 01/03/16 [phone]

## 2016-01-03 NOTE — Consult Note (Signed)
  Psychiatry: Patient continues to stay withdrawn in her room for the most part. Talks about suicide intermittently. Affect dysphoric. Refuses to be able to contract for safety or agreed to any kind of appropriate discharge plan. Although she is unlikely to benefit from short-term inpatient hospitalization she is not able to function outside the hospital and we are awaiting [facility] while continuing current medication. Case reviewed with TTS this evening

## 2016-01-03 NOTE — ED Notes (Signed)
Patient asleep in room. No noted distress or abnormal behavior. Will continue 15 minute checks and observation by security cameras for safety. 

## 2016-01-03 NOTE — ED Notes (Signed)
Pt lying on bed with TV on. No concerns voiced. No distress noted. Maintained on 15 minute checks and observation by security camera for safety.

## 2016-01-03 NOTE — ED Notes (Signed)
Pt. Alert and oriented, warm and dry, in no distress. Pt. Denies SI, HI, and AVH. Pt. Encouraged to let nursing staff know of any concerns or needs. 

## 2016-01-03 NOTE — ED Notes (Signed)
BEHAVIORAL HEALTH ROUNDING Patient sleeping: No. Patient alert and oriented: yes Behavior appropriate: Yes.  ; If no, describe: NA Nutrition and fluids offered: Yes  Toileting and hygiene offered: Yes  Sitter present: no Law enforcement present: No  Patient continues to sit in dayroom  watching TV.

## 2016-01-03 NOTE — ED Notes (Signed)
Pt in day room watching TV. Patient given graham cracker and peanut butter along with a soda for snack.

## 2016-01-04 MED ORDER — GABAPENTIN 100 MG PO CAPS
ORAL_CAPSULE | ORAL | Status: AC
  Administered 2016-01-04: 100 mg via ORAL
  Filled 2016-01-04: qty 1

## 2016-01-04 MED ORDER — MELOXICAM 7.5 MG PO TABS
ORAL_TABLET | ORAL | Status: AC
  Administered 2016-01-04: 7.5 mg via ORAL
  Filled 2016-01-04: qty 1

## 2016-01-04 MED ORDER — CYCLOBENZAPRINE HCL 10 MG PO TABS
ORAL_TABLET | ORAL | Status: AC
  Administered 2016-01-05: 5 mg
  Filled 2016-01-04: qty 1

## 2016-01-04 MED ORDER — TRAZODONE HCL 100 MG PO TABS
ORAL_TABLET | ORAL | Status: AC
  Administered 2016-01-04: 100 mg via ORAL
  Filled 2016-01-04: qty 1

## 2016-01-04 MED ORDER — HYDROXYZINE HCL 25 MG PO TABS
ORAL_TABLET | ORAL | Status: AC
  Administered 2016-01-04: 25 mg via ORAL
  Filled 2016-01-04: qty 1

## 2016-01-04 MED ORDER — VENLAFAXINE HCL ER 75 MG PO CP24
ORAL_CAPSULE | ORAL | Status: AC
  Administered 2016-01-04: 300 mg via ORAL
  Filled 2016-01-04: qty 4

## 2016-01-04 MED ORDER — PRAZOSIN HCL 2 MG PO CAPS
ORAL_CAPSULE | ORAL | Status: AC
  Administered 2016-01-04: 2 mg via ORAL
  Filled 2016-01-04: qty 1

## 2016-01-04 MED ORDER — IBUPROFEN 800 MG PO TABS
ORAL_TABLET | ORAL | Status: AC
  Filled 2016-01-04: qty 1

## 2016-01-04 NOTE — ED Notes (Signed)
ENVIRONMENTAL ASSESSMENT Potentially harmful objects out of patient reach: Yes Personal belongings secured: Yes Patient dressed in hospital provided attire only: Yes Plastic bags out of patient reach: Yes Patient care equipment (cords, cables, call bells, lines, and drains) shortened, removed, or accounted for: Yes Equipment and supplies removed from bottom of stretcher: Yes Potentially toxic materials out of patient reach: Yes Sharps container removed or out of patient reach: Yes  Patient is currently in room sleeping. No signs of acute distress noted. Maintained on 15 minute checks and observation by security camera for safety.  

## 2016-01-04 NOTE — ED Notes (Signed)
Patient is currently in room resting and watching television. No signs of distress noted. Maintained on 15 minute checks and observation by security camera for safety.

## 2016-01-04 NOTE — ED Notes (Signed)
Patient asleep in room. No noted distress or abnormal behavior. Will continue 15 minute checks and observation by security cameras for safety. 

## 2016-01-04 NOTE — ED Notes (Signed)
Patient currently in room watching television. No signs of distress noted. Maintained on 15 minute checks and observation by security camera for safety.  

## 2016-01-04 NOTE — ED Notes (Signed)
Patient resting quietly in room. No noted distress or abnormal behaviors noted. Will continue 15 minute checks and observation by security camera for safety. 

## 2016-01-04 NOTE — BH Assessment (Signed)
Confirmed with [facility] ([phone]), patient remain on their wait list. Updated IVC's faxed to them.

## 2016-01-04 NOTE — ED Provider Notes (Signed)
-----------------------------------------   8:14 AM on 01/04/2016 -----------------------------------------   Blood pressure 95/57, pulse 81, temperature 97.6 F (36.4 C), temperature source Oral, resp. rate 16, height 5\' 8"  (1.727 m), weight 236 lb (107.049 kg), SpO2 96 %.  The patient had no acute events since last update.  Calm and cooperative at this time.    The patient is awaiting acceptance to an outpatient facility.     Rebecka ApleyAllison P Webster, MD 01/04/16 [phone]

## 2016-01-04 NOTE — ED Notes (Signed)
Patient continues to endorse SI. No HI or AVH. Still has a vague plan to possibly overdose on pills. Endorses pain rated a level 10/10 but at this time refused lidocaine patch or other pain medication. Patient [facility] "go to groups and things" and that she would like to follow up with [facility] and go to outpatient groups once discharged.  Patient continues to be calm and cooperative. No signs of acute distress at this time. Maintained on 15 minute checks and observation by security camera for safety.

## 2016-01-04 NOTE — ED Notes (Signed)
Patient in room sleeping. No signs of acute distress noted. Maintained on 15 minute checks and observation by security camera for safety.

## 2016-01-04 NOTE — ED Notes (Signed)
Patient states that [facility] and feels that her depression has not lifted. She understands that going to the inpatient unit at this time is not an option but says that she is content waiting. Her outpatient goals are to find another place to live as there are drugs and alcohol in the home and get back in contact with her two children. However, she says that at this point she is not mentally prepared to do so and that she needs to work on her depression and SI first. Nurse provided reassurance and also encouraged patient to ambulate frequently. No signs of acute distress at this time. Maintained on 15 minute checks and observation by security camera for safety.

## 2016-01-04 NOTE — ED Notes (Signed)
Report was received from Sandy ConverseKarena T., RN; Pt. Verbalizes no complaints or distress;  verbalizing having S.I.; denies having Hi.; with stating, "I have no emotions today; i still feel suicidal."  Continue to monitor with 15 min. Monitoring.

## 2016-01-04 NOTE — ED Notes (Signed)
Patient currently in room watching television. No signs of acute distress noted at this time. Maintained on 15 minute checks and observation by security camera for safety.  

## 2016-01-04 NOTE — ED Notes (Signed)
Patient refused lidocaine patch.

## 2016-01-04 NOTE — ED Notes (Signed)
Lunch placed in room ,pt offers no co appears in no distress

## 2016-01-05 MED ORDER — PRAZOSIN HCL 2 MG PO CAPS
ORAL_CAPSULE | ORAL | Status: AC
  Administered 2016-01-05: 2 mg via ORAL
  Filled 2016-01-05: qty 1

## 2016-01-05 MED ORDER — CYCLOBENZAPRINE HCL 10 MG PO TABS
ORAL_TABLET | ORAL | Status: AC
  Administered 2016-01-05: 5 mg via ORAL
  Filled 2016-01-05: qty 1

## 2016-01-05 MED ORDER — GABAPENTIN 100 MG PO CAPS
ORAL_CAPSULE | ORAL | Status: AC
  Administered 2016-01-05: 100 mg via ORAL
  Filled 2016-01-05: qty 1

## 2016-01-05 MED ORDER — TRAZODONE HCL 100 MG PO TABS
ORAL_TABLET | ORAL | Status: AC
  Administered 2016-01-05: 100 mg via ORAL
  Filled 2016-01-05: qty 1

## 2016-01-05 MED ORDER — IBUPROFEN 800 MG PO TABS
ORAL_TABLET | ORAL | Status: AC
  Administered 2016-01-05: 800 mg via ORAL
  Filled 2016-01-05: qty 1

## 2016-01-05 MED ORDER — VENLAFAXINE HCL ER 75 MG PO CP24
ORAL_CAPSULE | ORAL | Status: AC
  Administered 2016-01-05: 300 mg via ORAL
  Filled 2016-01-05: qty 4

## 2016-01-05 MED ORDER — GABAPENTIN 100 MG PO CAPS
ORAL_CAPSULE | ORAL | Status: AC
  Filled 2016-01-05: qty 1

## 2016-01-05 MED ORDER — MELOXICAM 7.5 MG PO TABS
ORAL_TABLET | ORAL | Status: AC
  Filled 2016-01-05: qty 1

## 2016-01-05 MED ORDER — HYDROXYZINE HCL 25 MG PO TABS
ORAL_TABLET | ORAL | Status: AC
  Administered 2016-01-05: 25 mg via ORAL
  Filled 2016-01-05: qty 1

## 2016-01-05 MED ORDER — MELOXICAM 7.5 MG PO TABS
ORAL_TABLET | ORAL | Status: AC
  Administered 2016-01-05: 7.5 mg via ORAL
  Filled 2016-01-05: qty 1

## 2016-01-05 NOTE — BH Assessment (Signed)
Confirmed with [facility] ([phone]), patient remain on their wait list. Nothing need at this time.

## 2016-01-05 NOTE — Consult Note (Signed)
  Psychiatry: Patient with bipolar disorder borderline personality disorder who has been waiting in the hospital emergency room because of ongoing suicidal ideation. [facility] Today the patient says her mood is starting to feel better. She says that she thinks that she is probably less suicidal. She may have a place to stay over the next day or so. She did interact with representatives from [facility] and is willing to engage in treatment. We have not heard back [facility]. No change to medicine.

## 2016-01-05 NOTE — ED Notes (Signed)
Patient asleep in room. No noted distress or abnormal behavior. Will continue 15 minute checks and observation by security cameras for safety. 

## 2016-01-05 NOTE — ED Notes (Signed)

## 2016-01-05 NOTE — ED Notes (Signed)
Patient resting quietly in bed, watching television. Cooperative with nursing interventions. No current complaints. Maintained on 15 minute checks for safety.

## 2016-01-05 NOTE — ED Notes (Signed)
Patient resting quietly in room. No noted distress or abnormal behaviors noted. Will continue 15 minute checks and observation by security camera for safety. 

## 2016-01-05 NOTE — ED Notes (Signed)
Patient reports no change in mood. Continues to report that if discharged, she will kill herself, although currently she does not have a specific plan. Maintained on 15 minute checks and observation by security camera for safety.

## 2016-01-05 NOTE — ED Notes (Signed)
Patient requesting pain medications for chronic pelvic pain.

## 2016-01-05 NOTE — ED Provider Notes (Signed)
-----------------------------------------   5:56 AM on 01/05/2016 -----------------------------------------   Blood pressure 121/66, pulse 71, temperature 98.1 F (36.7 C), temperature source Oral, resp. rate 16, height 5\' 8"  (1.727 m), weight 236 lb (107.049 kg), SpO2 97 %.  The patient had no acute events since last update.  Calm and cooperative at this time.  Disposition is pending per Psychiatry/Behavioral Medicine team recommendations.     Jennye MoccasinBrian S Florentine Diekman, MD 01/05/16 [phone]

## 2016-01-06 MED ORDER — IBUPROFEN 800 MG PO TABS
ORAL_TABLET | ORAL | Status: AC
  Filled 2016-01-06: qty 1

## 2016-01-06 MED ORDER — GABAPENTIN 100 MG PO CAPS
ORAL_CAPSULE | ORAL | Status: AC
  Administered 2016-01-06: 100 mg via ORAL
  Filled 2016-01-06: qty 1

## 2016-01-06 MED ORDER — PRAZOSIN HCL 2 MG PO CAPS
ORAL_CAPSULE | ORAL | Status: AC
  Administered 2016-01-06: 2 mg via ORAL
  Filled 2016-01-06: qty 1

## 2016-01-06 MED ORDER — HYDROXYZINE HCL 25 MG PO TABS
ORAL_TABLET | ORAL | Status: AC
  Administered 2016-01-06: 25 mg via ORAL
  Filled 2016-01-06: qty 1

## 2016-01-06 MED ORDER — IBUPROFEN 800 MG PO TABS
ORAL_TABLET | ORAL | Status: AC
  Administered 2016-01-06: 800 mg via ORAL
  Filled 2016-01-06: qty 1

## 2016-01-06 MED ORDER — TRAZODONE HCL 100 MG PO TABS
ORAL_TABLET | ORAL | Status: AC
  Filled 2016-01-06: qty 1

## 2016-01-06 MED ORDER — CYCLOBENZAPRINE HCL 10 MG PO TABS
ORAL_TABLET | ORAL | Status: AC
  Administered 2016-01-06: 5 mg via ORAL
  Filled 2016-01-06: qty 1

## 2016-01-06 MED ORDER — MELOXICAM 7.5 MG PO TABS
ORAL_TABLET | ORAL | Status: AC
  Filled 2016-01-06: qty 1

## 2016-01-06 MED ORDER — VENLAFAXINE HCL ER 75 MG PO CP24
ORAL_CAPSULE | ORAL | Status: AC
  Administered 2016-01-06: 300 mg via ORAL
  Filled 2016-01-06: qty 4

## 2016-01-06 MED ORDER — MELOXICAM 7.5 MG PO TABS
ORAL_TABLET | ORAL | Status: AC
  Administered 2016-01-06: 7.5 mg via ORAL
  Filled 2016-01-06: qty 1

## 2016-01-06 NOTE — BH Assessment (Signed)
Confirmed with [facility] ([phone]), patient remain on their wait list. Nothing need at this time.

## 2016-01-06 NOTE — ED Notes (Signed)
Patient currently on the phone speaking with representative [facility] [phone]( [phone]). Patient appears to be calm at this time. Maintained on 15 minute checks and observation by security camera for safety.

## 2016-01-06 NOTE — ED Notes (Signed)
Patient asleep in room. No noted distress or abnormal behavior. Will continue 15 minute checks and observation by security cameras for safety. 

## 2016-01-06 NOTE — ED Notes (Signed)
ENVIRONMENTAL ASSESSMENT Potentially harmful objects out of patient reach: Yes Personal belongings secured: Yes Patient dressed in hospital provided attire only: Yes Plastic bags out of patient reach: Yes Patient care equipment (cords, cables, call bells, lines, and drains) shortened, removed, or accounted for: Yes Equipment and supplies removed from bottom of stretcher: Yes Potentially toxic materials out of patient reach: Yes Sharps container removed or out of patient reach: Yes  No signs of acute distress noted at this time. Patient in room sleeping. Maintained on 15 minute checks and observation by security camera for safety.
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
## 2016-01-06 NOTE — ED Notes (Signed)
Patient resting quietly in room. No noted distress or abnormal behaviors noted. Will continue 15 minute checks and observation by security camera for safety. 

## 2016-01-06 NOTE — ED Notes (Addendum)
Patient resting quietly in room. No noted distress or abnormal behaviors noted. Will continue 15 minute checks and observation by security camera for safety. 

## 2016-01-06 NOTE — ED Provider Notes (Signed)
-----------------------------------------   7:25 AM on 01/06/2016 -----------------------------------------   Blood pressure 112/67, pulse 84, temperature 98.1 F (36.7 C), temperature source Oral, resp. rate 14, height 5\' 8"  (1.727 m), weight 236 lb (107.049 kg), SpO2 99 %.  The patient had no acute events since last update.  Calm and cooperative at this time.  Disposition is pending per Psychiatry/Behavioral Medicine team recommendations.     Jeanmarie PlantJames A Keiran Gaffey, MD 01/06/16 [phone]
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
## 2016-01-06 NOTE — ED Notes (Signed)

## 2016-01-06 NOTE — ED Notes (Signed)
Patient continues to have SI and says that if she is discharged she will kill herself. Denies HI and hallucinations. Endorses pain rated 10/10 in her left hip that has been treated with ibuprofen 800 mg. Patient is calm and cooperative and shows no acute signs of distress at this time. Maintained on 15 minute checks and observation by security camera for safety.

## 2016-01-06 NOTE — Consult Note (Signed)
  Psychiatry: Follow-up for this chronically severely mentally ill patient in the emergency room. Patient continues to state suicidal ideation. Feels depressed. Some consideration was given to possible discharge to shelters when the patient said she was feeling comfortable with that but that has fallen through there is no shelter available that he is willing to accommodate her. Continue current medicine. Unfortunately we are still in a position of waiting for referral to long-term psychiatric treatment.

## 2016-01-07 DIAGNOSIS — F603 Borderline personality disorder: Secondary | ICD-10-CM

## 2016-01-07 DIAGNOSIS — F331 Major depressive disorder, recurrent, moderate: Secondary | ICD-10-CM

## 2016-01-07 MED ORDER — MELOXICAM 7.5 MG PO TABS
ORAL_TABLET | ORAL | Status: AC
Start: 2016-01-07 — End: 2016-01-07
  Administered 2016-01-07: 7.5 mg via ORAL
  Filled 2016-01-07: qty 1

## 2016-01-07 MED ORDER — GABAPENTIN 100 MG PO CAPS
ORAL_CAPSULE | ORAL | Status: AC
  Administered 2016-01-07: 100 mg via ORAL
  Filled 2016-01-07: qty 1

## 2016-01-07 MED ORDER — IBUPROFEN 800 MG PO TABS
ORAL_TABLET | ORAL | Status: AC
  Administered 2016-01-07: 800 mg via ORAL
  Filled 2016-01-07: qty 1

## 2016-01-07 MED ORDER — CYCLOBENZAPRINE HCL 10 MG PO TABS
ORAL_TABLET | ORAL | Status: AC
  Administered 2016-01-07: 5 mg via ORAL
  Filled 2016-01-07: qty 1

## 2016-01-07 MED ORDER — CYCLOBENZAPRINE HCL 10 MG PO TABS
ORAL_TABLET | ORAL | Status: AC
  Filled 2016-01-07: qty 1

## 2016-01-07 MED ORDER — MELOXICAM 7.5 MG PO TABS
ORAL_TABLET | ORAL | Status: AC
  Administered 2016-01-07: 7.5 mg via ORAL
  Filled 2016-01-07: qty 1

## 2016-01-07 MED ORDER — VENLAFAXINE HCL ER 75 MG PO CP24
ORAL_CAPSULE | ORAL | Status: AC
  Administered 2016-01-07: 300 mg via ORAL
  Filled 2016-01-07: qty 4

## 2016-01-07 MED ORDER — PRAZOSIN HCL 2 MG PO CAPS
ORAL_CAPSULE | ORAL | Status: AC
  Administered 2016-01-07: 2 mg via ORAL
  Filled 2016-01-07: qty 1

## 2016-01-07 NOTE — ED Notes (Signed)
Pt upset after speaking with psychiatrist. Pt stated the thought of her possible discharge "raises her suicidally."  Pt still states she would kill herself if made to leave the hospital before she is ready. Pt wanting to go to [facility] so she could attend groups. Psychiatrist recommended to pt that she spend her day in the dayroom and not in bed. Pt not receptive. Pt currently sitting in chair in her room watching TV. No distress noted. Maintained on 15 minute checks and observation by security camera for safety.

## 2016-01-07 NOTE — ED Notes (Signed)
Patient asleep in room. No noted distress or abnormal behavior. Will continue 15 minute checks and observation by security cameras for safety. 

## 2016-01-07 NOTE — ED Provider Notes (Signed)
-----------------------------------------   7:04 AM on 01/07/2016 -----------------------------------------   Blood pressure 115/81, pulse 73, temperature 97.8 F (36.6 C), temperature source Oral, resp. rate 18, height 5\' 8"  (1.727 m), weight 236 lb (107.049 kg), SpO2 99 %.  The patient had no acute events since last update.  Patient has been seen and evaluated by psychiatry, they're currently attempting to refer the patient to an appropriate facility for ongoing care.   Minna AntisKevin Jehiel Koepp, MD 01/07/16 [phone]

## 2016-01-07 NOTE — ED Notes (Signed)
Pt depressed and sad. Endorses SI. Denies HI and AVH. Pt denies any needs or concerns. No distress noted. Maintained on 15 minute checks and observation by security camera for safety.

## 2016-01-07 NOTE — Consult Note (Signed)
[facility] Face-to-Face Psychiatry Consult   Reason for Consult:  Follow-up for this 40 year old woman with a history of mood disorder anxiety disorder and personality disorder who has been in the emergency room  Patient Identification: Sandy Mason MRN:  [id number] Principal Diagnosis: Major depressive disorder, recurrent episode, moderate (HCC) Diagnosis:   Patient Active Problem List   Diagnosis Date Noted  . Alcohol intoxication (HCC) [F10.129]   . Severe episode of recurrent major depressive disorder, without psychotic features (HCC) [F33.2]   . Malingering [Z76.5] 12/16/2015  . Major depressive disorder, recurrent episode, moderate (HCC) [F33.1] 12/12/2015  . Closed pelvic fracture (HCC) [S32.9XXA] 10/31/2015  . Tobacco abuse [Z72.0] 10/31/2015  . Tobacco use disorder [F17.200] 03/28/2015  . PTSD (post-traumatic stress disorder) [F43.10] 03/28/2015  . Stimulant use disorder (cocaine) [F15.90] 03/28/2015  . Sedative, hypnotic or anxiolytic use disorder, severe, dependence (HCC) [F13.20] 03/27/2015  . Opioid use disorder, severe, dependence (HCC) [F11.20] 12/20/2014  . Alcohol use disorder severe. [F10.20] 12/18/2014  . Borderline personality disorder [F60.3] 12/18/2014    Total Time spent with patient: 20 minutes  Subjective:   Sandy Mason is a 40 y.o. female patient Was seen for follow-up. I have interviewed the patient as well as discussed the case with the behavioral health staff and the nursing staff who has been monitoring the patient since her admission. Staff reported that patient is unable to contract for safety and she keeps telling them that she has been having suicidal ideations. She is spending most of the time in her room and will come out in the evening to watch TV.  During my interview patient was lying comfortably in the bed. She reported that she does not want to leave her room and does not want to be discharged from the hospital. We discussed the option of  going to the rehabilitation program but she remains skeptical. When I discussed with her that she is going to be discharged from the hospital she certainly came up with a plan of overdosing on the cocaine. She reported that she has money and she will take a lethal dose of cocaine and inject herself. I asked said that how she is getting all the money and she reported that she is getting the Social Security disability. However she is not actively participating in her discharge planning and spending most of the time in the bed. The staff has been talking to her about discharge planning and she does not participate.  Her medications have been adjusted. She currently denied having any perceptual disturbances.  Substance abuse history: History of abuse of multiple substances opiates as well as stimulants in the past.  Past Psychiatric History: Patient has a history of mood instability with the diagnosis variously of bipolar disorder PTSD and borderline personality disorder. Has a tendency to become explosive at times. She has been relatively stable in the emergency room not aggressive or violent. She does have a past history of suicide attempts and aggression however.  Risk to Self: Suicidal Ideation: Yes-Currently Present Suicidal Intent: Yes-Currently Present Is patient at risk for suicide?: Yes Suicidal Plan?: Yes-Currently Present Specify Current Suicidal Plan: Pt reports she walked out in front of a car Access to Means: Yes Specify Access to Suicidal Means: access to traffic What has been your use of drugs/alcohol within the last 12 months?: alcohol How many times?: 2 Other Self Harm Risks: None identified Triggers for Past Attempts: Unknown Intentional Self Injurious Behavior: None Risk to Others: Homicidal Ideation: No Thoughts of Harm  to Others: No Current Homicidal Intent: No Current Homicidal Plan: No Access to Homicidal Means: No Identified Victim: None identified History of harm to  others?: No Assessment of Violence: None Noted Violent Behavior Description: None identified Does patient have access to weapons?: No Criminal Charges Pending?: No Does patient have a court date: No Prior Inpatient Therapy: Prior Inpatient Therapy: Yes Prior Therapy Dates: 11/2015 [facility] Reason for Treatment: PTSD Prior Outpatient Therapy: Prior Outpatient Therapy: Yes Prior Therapy Dates: current Prior Therapy Facilty/Provider(s): [facility] Reason for Treatment: ptsd Does patient have an ACCT team?: No Does patient have Intensive In-House Services?  : No Does patient have [facility] services? : No Does patient have P4CC services?: No  Past Medical History:  Past Medical History  Diagnosis Date  . CVA (cerebral infarction)   . Seizures (HCC)   . Depression   . Anxiety   . Suicide attempt by hanging (HCC)   . Alcohol use disorder severe. 12/18/2014  . 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 Borderline personality disorder 12/18/2014  . Opioid use disorder, severe, dependence (HCC) 12/20/2014  . 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 PTSD (post-traumatic stress disorder) 03/28/2015   History reviewed. No pertinent past surgical history. Family History:  Family History  Problem Relation Age of Onset  . Diabetes Father    Family Psychiatric  History: Family history positive for mood instability Social History:  History  Alcohol Use  . 3.6 oz/week  . 6 Cans of beer per week    Comment: tonight 6      History  Drug Use  . Yes  . Special: Benzodiazepines, Hydrocodone    Social History   Social History  . Marital Status: Single    Spouse Name: N/A  . Number of Children: N/A  . Years of Education: N/A   Social History Main Topics  . Smoking status: Current Every Day Smoker -- 1.00 packs/day    Types: Cigarettes  . Smokeless tobacco: None  . Alcohol  Use: 3.6 oz/week    6 Cans of beer per week     Comment: tonight 6   . Drug Use: Yes    Special: Benzodiazepines, Hydrocodone  . Sexual Activity: Yes    Birth Control/ Protection: IUD   Other Topics Concern  . None   Social History Narrative   Additional Social History:    Allergies:   Allergies  Allergen Reactions  . Depakote [Valproic Acid] Other (See Comments)    Reaction:  Seizures   . Haldol [Haloperidol Lactate] Other (See Comments)    Reaction:  Seizures   . Keflet [Cephalexin] Rash    Labs: No results found for this or any previous visit (from the past 48 hour(s)).  Current Facility-Administered Medications  Medication Dose Route Frequency Provider Last Rate Last Dose  . cyclobenzaprine (FLEXERIL) tablet 5 mg  5 mg Oral TID PRN Loleta Roseory Forbach, MD   5 mg at 01/06/16 2008  . gabapentin (NEURONTIN) capsule 100 mg  100 mg Oral TID Brandy HaleUzma Jermani Pund, MD   100 mg at 01/07/16 0930  . ibuprofen (ADVIL,MOTRIN) tablet 800 mg  800 mg Oral Q8H PRN Sharyn CreamerMark Quale, MD   800 mg at 01/06/16 2007  . lidocaine (LIDODERM) 5 % 2 patch  2 patch Transdermal Q24H Loleta Roseory Forbach, MD   2 patch at 01/01/16 [phone]  . meloxicam (MOBIC) tablet 7.5 mg  7.5 mg Oral BID WC Loleta Roseory Forbach, MD  7.5 mg at 01/07/16 0930  . mirtazapine (REMERON) tablet 15 mg  15 mg Oral QHS Audery Amel, MD   15 mg at 01/06/16 2149  . prazosin (MINIPRESS) capsule 2 mg  2 mg Oral QHS Loleta Rose, MD   2 mg at 01/06/16 2149  . venlafaxine XR (EFFEXOR-XR) 24 hr capsule 300 mg  300 mg Oral Q breakfast Audery Amel, MD   300 mg at 01/07/16 1610   Current Outpatient Prescriptions  Medication Sig Dispense Refill  . cyclobenzaprine (FLEXERIL) 5 MG tablet Take 1 tablet (5 mg total) by mouth 3 (three) times daily as needed for muscle spasms. 15 tablet 0  . gabapentin (NEURONTIN) 300 MG capsule Take 3 capsules (900 mg total) by mouth 3 (three) times daily. 120 capsule 0  . hydrOXYzine (VISTARIL) 25 MG capsule Take 1 capsule (25 mg total) by  mouth 3 (three) times daily as needed. 30 capsule 0  . lidocaine (LIDODERM) 5 % Place 2 patches onto the skin daily. Remove & Discard patch within 12 hours or as directed by MD 60 patch 0  . meloxicam (MOBIC) 7.5 MG tablet Take 1 tablet (7.5 mg total) by mouth 2 (two) times daily with a meal. 60 tablet 0  . mirtazapine (REMERON) 15 MG tablet Take 1 tablet (15 mg total) by mouth at bedtime. 30 tablet 0  . nitrofurantoin, macrocrystal-monohydrate, (MACROBID) 100 MG capsule Take 1 capsule (100 mg total) by mouth every 12 (twelve) hours. 3 capsule 0  . prazosin (MINIPRESS) 2 MG capsule Take 1 capsule (2 mg total) by mouth at bedtime. 30 capsule 0  . venlafaxine XR (EFFEXOR-XR) 150 MG 24 hr capsule Take 2 capsules (300 mg total) by mouth daily with breakfast. 30 capsule 0    Musculoskeletal: Strength & Muscle Tone: within normal limits Gait & Station: normal Patient leans: N/A  Psychiatric Specialty Exam: Physical Exam  Nursing note and vitals reviewed. Constitutional: She appears well-developed and well-nourished.  HENT:  Head: Normocephalic and atraumatic.  Eyes: Conjunctivae are normal. Pupils are equal, round, and reactive to light.  Neck: Normal range of motion.  Cardiovascular: Normal rate and normal heart sounds.   Respiratory: Effort normal. No respiratory distress.  GI: Soft.  Musculoskeletal: Normal range of motion.  Neurological: She is alert.  Skin: Skin is warm and dry.  Psychiatric: Her mood appears anxious. Her speech is delayed. She is slowed and withdrawn. She expresses impulsivity. She exhibits a depressed mood. She expresses suicidal ideation. She expresses suicidal plans.    Review of Systems  Constitutional: Negative.   HENT: Negative.   Eyes: Negative.   Respiratory: Negative.   Cardiovascular: Negative.   Gastrointestinal: Negative.   Musculoskeletal: Negative.   Skin: Negative.   Neurological: Negative.   Psychiatric/Behavioral: Negative for depression,  suicidal ideas, hallucinations, memory loss and substance abuse.  All other systems reviewed and are negative.   Blood pressure 115/81, pulse 73, temperature 97.8 F (36.6 C), temperature source Oral, resp. rate 18, height 5\' 8"  (1.727 m), weight 236 lb (107.049 kg), SpO2 99 %.Body mass index is 35.89 kg/(m^2).  General Appearance: Casual  Eye Contact:  Fair  Speech:  Normal Rate  Volume:  Decreased  Mood:  Anxious  Affect:  Appropriate  Thought Process:  Goal Directed  Orientation:  Full (Time, Place, and Person)  Thought Content:  Negative  Suicidal Thoughts:  Yes.  with intent/plan  Homicidal Thoughts:  No  Memory:  Immediate;   Fair Recent;   Fair  Remote;   Fair  Judgement:  Fair  Insight:  Fair  Psychomotor Activity:  Normal  Concentration:  Concentration: Good  Recall:  Fair  Fund of Knowledge:  Fair  Language:  Fair  Akathisia:  No  Handed:  Right  AIMS (if indicated):     Assets:  Desire for Improvement Housing Physical Health  ADL's:  Intact  Cognition:  WNL  Sleep:        Treatment Plan Summary: Patient continues to make statements about being suicidal. Very gamey. Also does not appears depressed. Not obviously psychotic. Patient has had multiple attempts at discharge.  Very resistant to treatment. Patient is on the list for referral to [facility] at this point. Continue current medicine  Her involuntary commitment will expire and I will not renew at this time.   Encouraged staff to discuss with the patient about the placement in a substance abuse rehabilitation program including [facility] and they will work on the placement with the patient    Disposition: Patient does not meet criteria for psychiatric inpatient admission. Supportive therapy provided about ongoing stressors.  Brandy Hale, MD 01/07/2016 12:14 PM

## 2016-01-07 NOTE — ED Notes (Signed)
Pt requested Flexeril stating pain was still a 10/10 in pelvis. Medication administered as ordered PRN. No further concerns voiced. No distress noted. Maintained on 15 minute checks and observation by security camera for safety.

## 2016-01-07 NOTE — ED Notes (Signed)
Pt is alert and oriented watching TV in the dayroom this evening. Pt mood is sad, but she is pleasant and cooperative with staff. Writer discussed tx plan, provided food and fluids and administered medications as prescribed. Pt is continues to c/o pelvic pain as well. Writer encouraged ambulation and stretching in order to further promote healing and relieve pain. Pt acknowledges understanding.15 minute checks are ongoing for safety.

## 2016-01-07 NOTE — ED Notes (Signed)
Patient resting quietly in room. No noted distress or abnormal behaviors noted. Will continue 15 minute checks and observation by security camera for safety. 

## 2016-01-08 MED ORDER — GABAPENTIN 100 MG PO CAPS
100.0000 mg | ORAL_CAPSULE | Freq: Two times a day (BID) | ORAL | Status: DC
  Administered 2016-01-08 – 2016-01-09 (×2): 100 mg via ORAL
  Filled 2016-01-08 (×2): qty 1

## 2016-01-08 MED ORDER — CYCLOBENZAPRINE HCL 10 MG PO TABS
5.0000 mg | ORAL_TABLET | Freq: Every day | ORAL | Status: DC
  Administered 2016-01-08: 10 mg via ORAL
  Filled 2016-01-08: qty 1

## 2016-01-08 NOTE — ED Notes (Signed)
Patient took a shower.  She has spent most of the shift in her room. No specific complaints. Maintained on 15 minute checks and observation by security camera for safety.

## 2016-01-08 NOTE — Consult Note (Signed)
[facility]   Reason for Consult:  Follow-up for this 40 year old woman with a history of mood disorder anxiety disorder and personality disorder who has been in the emergency room  Patient Identification: Sandy Mason MRN:  [id number] Principal Diagnosis: Major depressive disorder, recurrent episode, moderate (HCC) Diagnosis:   Patient Active Problem List   Diagnosis Date Noted  . Alcohol intoxication (HCC) [F10.129]   . Severe episode of recurrent major depressive disorder, without psychotic features (HCC) [F33.2]   . Malingering [Z76.5] 12/16/2015  . Major depressive disorder, recurrent episode, moderate (HCC) [F33.1] 12/12/2015  . Closed pelvic fracture (HCC) [S32.9XXA] 10/31/2015  . Tobacco abuse [Z72.0] 10/31/2015  . Tobacco use disorder [F17.200] 03/28/2015  . PTSD (post-traumatic stress disorder) [F43.10] 03/28/2015  . Stimulant use disorder (cocaine) [F15.90] 03/28/2015  . Sedative, hypnotic or anxiolytic use disorder, severe, dependence (HCC) [F13.20] 03/27/2015  . Opioid use disorder, severe, dependence (HCC) [F11.20] 12/20/2014  . Alcohol use disorder severe. [F10.20] 12/18/2014  . Borderline personality disorder [F60.3] 12/18/2014    Total Time spent with patient: 20 minutes  Subjective:   Sandy Mason is a 40 y.o. female patient  Who was seen for follow-up. She was lying in the bed. She reported that she has been sleeping for more than 11 hours on a daily basis. She reported that she is feeling better and does not have any suicidal ideations or plans. She reported that she comes out of the room in the evening when there is not much out in the day room. She reported that she has started improving on the medications. We discussed about the discharge plan and she is open to discussion. She reported that she has discussed the behavioral health staff and will come up with a plan tomorrow. Behavioral Health staff is actively seeking for other  options. She currently denied having any suicidal ideations or plans today. She denied having any withdrawal symptoms.    Substance abuse history: History of abuse of multiple substances opiates as well as stimulants in the past.  Past Psychiatric History: Patient has a history of mood instability with the diagnosis variously of bipolar disorder PTSD and borderline personality disorder. Has a tendency to become explosive at times. She has been relatively stable in the emergency room not aggressive or violent. She does have a past history of suicide attempts and aggression however.  Risk to Self: Suicidal Ideation: Yes-Currently Present Suicidal Intent: Yes-Currently Present Is patient at risk for suicide?: Yes Suicidal Plan?: Yes-Currently Present Specify Current Suicidal Plan: Pt reports she walked out in front of a car Access to Means: Yes Specify Access to Suicidal Means: access to traffic What has been your use of drugs/alcohol within the last 12 months?: alcohol How many times?: 2 Other Self Harm Risks: None identified Triggers for Past Attempts: Unknown Intentional Self Injurious Behavior: None Risk to Others: Homicidal Ideation: No Thoughts of Harm to Others: No Current Homicidal Intent: No Current Homicidal Plan: No Access to Homicidal Means: No Identified Victim: None identified History of harm to others?: No Assessment of Violence: None Noted Violent Behavior Description: None identified Does patient have access to weapons?: No Criminal Charges Pending?: No Does patient have a court date: No Prior Inpatient Therapy: Prior Inpatient Therapy: Yes Prior Therapy Dates: 11/2015 [facility] Reason for Treatment: PTSD Prior Outpatient Therapy: Prior Outpatient Therapy: Yes Prior Therapy Dates: current Prior Therapy Facilty/Provider(s): [facility] Reason for Treatment: ptsd Does patient have an ACCT team?: No Does patient  have Intensive In-House  Services?  : No Does patient have [facility] services? : No Does patient have P4CC services?: No  Past Medical History:  Past Medical History  Diagnosis Date  . CVA (cerebral infarction)   . Seizures (HCC)   . Depression   . Anxiety   . Suicide attempt by hanging (HCC)   . Alcohol use disorder severe. 12/18/2014  . 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 Borderline personality disorder 12/18/2014  . Opioid use disorder, severe, dependence (HCC) 12/20/2014  . 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 PTSD (post-traumatic stress disorder) 03/28/2015   History reviewed. No pertinent past surgical history. Family History:  Family History  Problem Relation Age of Onset  . Diabetes Father    Family Psychiatric  History: Family history positive for mood instability Social History:  History  Alcohol Use  . 3.6 oz/week  . 6 Cans of beer per week    Comment: tonight 6      History  Drug Use  . Yes  . Special: Benzodiazepines, Hydrocodone    Social History   Social History  . Marital Status: Single    Spouse Name: N/A  . Number of Children: N/A  . Years of Education: N/A   Social History Main Topics  . Smoking status: Current Every Day Smoker -- 1.00 packs/day    Types: Cigarettes  . Smokeless tobacco: None  . Alcohol Use: 3.6 oz/week    6 Cans of beer per week     Comment: tonight 6   . Drug Use: Yes    Special: Benzodiazepines, Hydrocodone  . Sexual Activity: Yes    Birth Control/ Protection: IUD   Other Topics Concern  . None   Social History Narrative   Additional Social History:    Allergies:   Allergies  Allergen Reactions  . Depakote [Valproic Acid] Other (See Comments)    Reaction:  Seizures   . Haldol [Haloperidol Lactate] Other (See Comments)    Reaction:  Seizures   . Keflet [Cephalexin] Rash    Labs: No results found for this or any previous visit (from the past  48 hour(s)).  Current Facility-Administered Medications  Medication Dose Route Frequency Provider Last Rate Last Dose  . cyclobenzaprine (FLEXERIL) tablet 5 mg  5 mg Oral QHS Brandy Hale, MD      . gabapentin (NEURONTIN) capsule 100 mg  100 mg Oral BID Brandy Hale, MD      . ibuprofen (ADVIL,MOTRIN) tablet 800 mg  800 mg Oral Q8H PRN Sharyn Creamer, MD   800 mg at 01/07/16 1310  . lidocaine (LIDODERM) 5 % 2 patch  2 patch Transdermal Q24H Loleta Rose, MD   2 patch at 01/01/16 [phone]  . meloxicam (MOBIC) tablet 7.5 mg  7.5 mg Oral BID WC Loleta Rose, MD   7.5 mg at 01/08/16 0932  . mirtazapine (REMERON) tablet 15 mg  15 mg Oral QHS Audery Amel, MD   15 mg at 01/07/16 2209  . prazosin (MINIPRESS) capsule 2 mg  2 mg Oral QHS Loleta Rose, MD   2 mg at 01/07/16 2209  . venlafaxine XR (EFFEXOR-XR) 24 hr capsule 300 mg  300 mg Oral Q breakfast Audery Amel, MD   300 mg at 01/08/16 1914   Current Outpatient Prescriptions  Medication Sig Dispense Refill  . cyclobenzaprine (FLEXERIL) 5 MG tablet Take 1 tablet (5 mg total) by mouth 3 (three) times daily as  needed for muscle spasms. 15 tablet 0  . gabapentin (NEURONTIN) 300 MG capsule Take 3 capsules (900 mg total) by mouth 3 (three) times daily. 120 capsule 0  . hydrOXYzine (VISTARIL) 25 MG capsule Take 1 capsule (25 mg total) by mouth 3 (three) times daily as needed. 30 capsule 0  . lidocaine (LIDODERM) 5 % Place 2 patches onto the skin daily. Remove & Discard patch within 12 hours or as directed by MD 60 patch 0  . meloxicam (MOBIC) 7.5 MG tablet Take 1 tablet (7.5 mg total) by mouth 2 (two) times daily with a meal. 60 tablet 0  . mirtazapine (REMERON) 15 MG tablet Take 1 tablet (15 mg total) by mouth at bedtime. 30 tablet 0  . nitrofurantoin, macrocrystal-monohydrate, (MACROBID) 100 MG capsule Take 1 capsule (100 mg total) by mouth every 12 (twelve) hours. 3 capsule 0  . prazosin (MINIPRESS) 2 MG capsule Take 1 capsule (2 mg total) by mouth at  bedtime. 30 capsule 0  . venlafaxine XR (EFFEXOR-XR) 150 MG 24 hr capsule Take 2 capsules (300 mg total) by mouth daily with breakfast. 30 capsule 0    Musculoskeletal: Strength & Muscle Tone: within normal limits Gait & Station: normal Patient leans: N/A  Psychiatric Specialty Exam: Physical Exam  Nursing note and vitals reviewed. Constitutional: She appears well-developed and well-nourished.  HENT:  Head: Normocephalic and atraumatic.  Eyes: Conjunctivae are normal. Pupils are equal, round, and reactive to light.  Neck: Normal range of motion.  Cardiovascular: Normal rate and normal heart sounds.   Respiratory: Effort normal. No respiratory distress.  GI: Soft.  Musculoskeletal: Normal range of motion.  Neurological: She is alert.  Skin: Skin is warm and dry.  Psychiatric: Her mood appears anxious. Her speech is delayed. She is slowed and withdrawn. She expresses impulsivity. She exhibits a depressed mood. She expresses suicidal ideation. She expresses suicidal plans.    Review of Systems  Constitutional: Negative.   HENT: Negative.   Eyes: Negative.   Respiratory: Negative.   Cardiovascular: Negative.   Gastrointestinal: Negative.   Musculoskeletal: Negative.   Skin: Negative.   Neurological: Negative.   Psychiatric/Behavioral: Negative for depression, suicidal ideas, hallucinations, memory loss and substance abuse.  All other systems reviewed and are negative.   Blood pressure 111/71, pulse 76, temperature 98.3 F (36.8 C), temperature source Oral, resp. rate 14, height 5\' 8"  (1.727 m), weight 236 lb (107.049 kg), SpO2 96 %.Body mass index is 35.89 kg/(m^2).  General Appearance: Casual  Eye Contact:  Fair  Speech:  Normal Rate  Volume:  Normal  Mood:  Anxious  Affect:  Appropriate  Thought Process:  Goal Directed  Orientation:  Full (Time, Place, and Person)  Thought Content:  Negative  Suicidal Thoughts:  Yes.  with intent/plan  Homicidal Thoughts:  No  Memory:   Immediate;   Fair Recent;   Fair Remote;   Fair  Judgement:  Fair  Insight:  Fair  Psychomotor Activity:  Normal  Concentration:  Concentration: Good  Recall:  Fair  Fund of Knowledge:  Fair  Language:  Fair  Akathisia:  No  Handed:  Right  AIMS (if indicated):     Assets:  Desire for Improvement Housing Physical Health  ADL's:  Intact  Cognition:  WNL  Sleep:        Treatment Plan Summary: Patient currently denied having any suicidal ideations or plans. She is compliant with her medications.  I have adjusted her medications and will decrease the dose of  Flexeril 5 mg by mouth daily at bedtime as she is also on Mobic at this time.  I will also decrease gabapentin 100 mg by mouth twice a day to decrease the sedation  She will continue on Effexor XR 300 mg daily  Behavioral Health staff is working with the placement options and discuss with her  about the IOP/ Substance use  program in detail  Her involuntary commitment has expired and she does not meet the criteria for involuntary commitment at this time  She can be discharged at the discretion of the ER physician.   Thank you for allowing me to participate  in the care of this patient   Disposition: Patient does not meet criteria for psychiatric inpatient admission. Supportive therapy provided about ongoing stressors.  Brandy Hale, MD 01/08/2016 11:41 AM

## 2016-01-08 NOTE — ED Notes (Signed)
Graham crackers and peanut butter without utensils given at this time.

## 2016-01-08 NOTE — ED Notes (Signed)
Patient asleep in room. No noted distress or abnormal behavior. Will continue 15 minute checks and observation by security cameras for safety. 

## 2016-01-08 NOTE — ED Notes (Signed)
Patient received supper tray. 

## 2016-01-08 NOTE — ED Notes (Signed)
Patient resting quietly in room. No noted distress or abnormal behaviors noted. Will continue 15 minute checks and observation by security camera for safety. 

## 2016-01-08 NOTE — ED Provider Notes (Signed)
-----------------------------------------   5:59 AM on 01/08/2016 -----------------------------------------   BP 115/81 mmHg  Pulse 73  Temp(Src) 97.8 F (36.6 C) (Oral)  Resp 18  Ht 5\' 8"  (1.727 m)  Wt 236 lb (107.049 kg)  BMI 35.89 kg/m2  SpO2 99%  Patient's IVC is set to run out. Her most recent psychiatric nore they do not feel she requires involuntary commitment at this time, however she is still apparently [facility]. Pending disposition.     Phineas SemenGraydon Will Heinkel, MD 01/08/16 [phone]

## 2016-01-08 NOTE — ED Notes (Signed)
Patient resting quietly in room. No noted distress or abnormal behaviors noted. Will continue 15 minute checks and observation by security camera for safety. 

## 2016-01-09 NOTE — ED Notes (Signed)

## 2016-01-09 NOTE — ED Provider Notes (Signed)
-----------------------------------------   8:26 AM on 01/09/2016 -----------------------------------------   Blood pressure 118/70, pulse 77, temperature 98.1 F (36.7 C), temperature source Oral, resp. rate 16, height 5\' 8"  (1.727 m), weight 236 lb (107.049 kg), SpO2 97 %.  The patient had no acute events since last update.  Calm and cooperative at this time.    The patient to be discharged today     Rebecka ApleyAllison P Merle Whitehorn, MD 01/09/16 [phone]

## 2016-01-09 NOTE — Consult Note (Signed)
  Psychiatry: Follow-up for this [id number] year old woman who has been in the emergency room for an extended period of time. Over the weekend she is documented as having told the psychiatrist on call that she had no suicidal thoughts. I have spoken with nursing on duty today who confirmed that she stated she had no suicidal thoughts this morning. When I spoke to her this morning she told me that she had suicidal thoughts but that her plan was to inject herself with a lot of cocaine. Patient presents to me as being dysphoric and makes very poor eye contact. This is different than how it is documented she presented to other providers.  [facility] because she is unlikely to benefit from inpatient treatment on our unit. Meanwhile patient does not appear to be showing much motivation for treatment or much improvement in the emergency room.  Psychiatrist covering over the weekend judge the patient has being ready for discharge and recommended discharge. TTS work with the patient and has already given her referral information for outpatient treatment.  Patient clearly presents a differently to different providers and at different times. I understand that she is chronically labile and unstable with chronic suicidal ideation however she is unlikely to benefit certainly not from the continued treatment in the emergency room. She would actually be more likely to get active treatment outside the emergency room going to our TS.  I will recommend that we do take her off commitment and discharge her from the emergency room today. Patient states that she will go back to the home she was living in previously. Agrees that she will follow-up with outpatient treatment. No change to diagnosis.

## 2016-01-09 NOTE — ED Notes (Signed)
Patient remains calm and cooperative, no issues to report at this time. Patient denies SI, HI.Security cameras and Q15 minutes monitoring continues.

## 2016-01-09 NOTE — ED Notes (Signed)
Patient worked out discharge arrangements with a friend.

## 2016-01-09 NOTE — Discharge Instructions (Signed)
Chemical Dependency Chemical dependency is an addiction to drugs or alcohol. It is characterized by the repeated behavior of seeking out and using drugs and alcohol despite harmful consequences to the health and safety of ones self and others.  RISK FACTORS There are certain situations or behaviors that increase a person's risk for chemical dependency. These include:  A family history of chemical dependency.  A history of mental health issues, including depression and anxiety.  A home environment where drugs and alcohol are easily available to you.  Drug or alcohol use at a young age. SYMPTOMS  The following symptoms can indicate chemical dependency:  Inability to limit the use of drugs or alcohol.  Nausea, sweating, shakiness, and anxiety that occurs when alcohol or drugs are not being used.  An increase in amount of drugs or alcohol that is necessary to get drunk or high. People who experience these symptoms can assess their use of drugs and alcohol by asking themselves the following questions:  Have you been told by friends or family that they are worried about your use of alcohol or drugs?  Do friends and family ever tell you about things you did while drinking alcohol or using drugs that you do not remember?  Do you lie about using alcohol or drugs or about the amounts you use?  Do you have difficulty completing daily tasks unless you use alcohol or drugs?  Is the level of your work or school performance lower because of your drug or alcohol use?  Do you get sick from using drugs or alcohol but keep using anyway?  Do you feel uncomfortable in social situations unless you use alcohol or drugs?  Do you use drugs or alcohol to help forget problems? An answer of yes to any of these questions may indicate chemical dependency. Professional evaluation is suggested.   This information is not intended to replace advice given to you by your health care provider. Make sure you  discuss any questions you have with your health care provider.   Document Released: 07/10/2001 Document Revised: 10/08/2011 Document Reviewed: 09/21/2010 Elsevier Interactive Patient Education [employer]2016 Elsevier Inc.  Please return immediately if condition worsens. Please contact Sandy Mason primary physician or the physician you were given for referral. If you have any specialist physicians involved in Sandy Mason treatment and plan please also contact them. Thank you for using [facility].  Suicidal Feelings: How to Help Yourself Suicide is the taking of one's own life. If you feel as though life is getting too tough to handle and are thinking about suicide, get help right away. To get help:  Call your local emergency services (911 in the [place]  Call a suicide hotline to speak with a trained counselor who understands how you are feeling. The following is a list of suicide hotlines in the [place]. For a list of hotlines in [place], visit [employer]ributor.itwww.suicide.org/hotlines/international/[place]-suicide-hotlines.html.  1-800-273-TALK (517)851-8481(1-806-751-0788).  1-800-SUICIDE 571-348-2078(1-(641)049-0546).  503-396-12481-(343)761-2249. This is a hotline for Spanish speakers.  9-563-875-6EPP1-800-799-4TTY (801)739-3586(1-930-511-1129). This is a hotline for TTY users.  1-866-4-U-TREVOR 405-561-8025(1-367 501 6845). This is a hotline for lesbian, gay, bisexual, transgender, or questioning youth.  Contact a crisis center or a local suicide prevention center. To find a crisis center or suicide prevention center:  Call your local hospital, clinic, community service organization, mental health center, social service provider, or health department. Ask for assistance in connecting to a crisis center.  Visit https://www.patel-king.com/www.suicidepreventionlifeline.org/getinvolved/locator for a list of crisis centers in the [place], or visit www.suicideprevention.ca/thinking-about-suicide/find-a-crisis-centre for  a list of centers in [place].  Visit the following  websites:  National Suicide Prevention Lifeline: www.suicidepreventionlifeline.org  Hopeline: www.hopeline.com  [employer] for Suicide Prevention: https://www.ayers.com/  [employer] (for lesbian, gay, bisexual, transgender, or questioning youth): www.thetrevorproject.org HOW CAN I HELP MYSELF FEEL BETTER?  Promise yourself that you will not do anything drastic when you have suicidal feelings. Remember, there is hope. Many people have gotten through suicidal thoughts and feelings, and you will, too. You may have gotten through them before, and this proves that you can get through them again.  Let family, friends, teachers, or counselors know how you are feeling. Try not to isolate yourself from those who care about you. Remember, they will want to help you. Talk with someone every day, even if you do not feel sociable. Face-to-face conversation is best.  Call a mental health professional and see one regularly.  Visit your primary health care provider every year.  Eat a well-balanced diet, and space your meals so you eat regularly.  Get plenty of rest.  Avoid alcohol and drugs, and remove them from your home. They will only make you feel worse.  If you are thinking of taking a lot of medicine, give your medicine to someone who can give it to you one day at a time. If you are on antidepressants and are concerned you will overdose, let your health care provider know so he or she can give you safer medicines. Ask your mental health professional about the possible side effects of any medicines you are taking.  Remove weapons, poisons, knives, and anything else that could harm you from your home.  Try to stick to routines. Follow a schedule every day. Put self-care on your schedule.  Make a list of realistic goals, and cross them off when you achieve them. Accomplishments give a sense of worth.  Wait until you are feeling better before doing the things you find difficult or  unpleasant.  Exercise if you are able. You will feel better if you exercise for even a half hour each day.  Go out in the sun or into nature. This will help you recover from depression faster. If you have a favorite place to walk, go there.  Do the things that have always given you pleasure. Play your favorite music, read a good book, paint a picture, play your favorite instrument, or do anything else that takes your mind off your depression if it is safe to do.  Keep your living space well lit.  When you are feeling well, write yourself a letter about tips and support that you can read when you are not feeling well.  Remember that life's difficulties can be sorted out with help. Conditions can be treated. You can work on thoughts and strategies that serve you well.   This information is not intended to replace advice given to you by your health care provider. Make sure you discuss any questions you have with your health care provider.   Document Released: 01/20/2003 Document Revised: 08/06/2014 Document Reviewed: 11/10/2013 Elsevier Interactive Patient Education [employer].

## 2016-01-09 NOTE — ED Notes (Signed)
Patient awake for medications. She states no significant change in mood.Maintained on 15 minute checks and observation by security camera for safety.

## 2016-01-09 NOTE — ED Notes (Signed)
Patient alert and oriented in no apparent distress. Patient denies SI, HI, and AVH. Patient encouraged to let nursing staff know of any concerns or needs. Patient is in her room watching TV stating that she is in her room " because of the colorful people in here now". No distress noted. Maintained on 15 minute checks and observation by security camera for safety.

## 2016-01-09 NOTE — ED Notes (Signed)
Patient resting quietly in room. No noted distress or abnormal behaviors noted. Will continue 15 minute checks and observation by security camera for safety. 

## 2016-01-09 NOTE — ED Notes (Signed)
Patient meeting with psychiatrist at present. 

## 2016-01-09 NOTE — ED Notes (Signed)
Patient asleep in room. No noted distress or abnormal behavior. Will continue 15 minute checks and observation by security cameras for safety. 

## 2016-01-09 NOTE — ED Notes (Signed)
Discussed discharge with patient and reviewed all follow up care. Patient is calling roommate to see if she can return home.

## 2016-01-09 NOTE — ED Notes (Signed)
Patient received breakfast tray 

## 2016-01-09 NOTE — ED Notes (Signed)
Patient discharged to self, with cab voucher. She will be going to a friend's house. At time of discharge, she denies SI or HI. Discharge instructions reviewed with staff, she verbalizes understanding. Patient received copy of DC plan and all personal belongings.

## 2016-01-09 NOTE — Progress Notes (Signed)
Per request of Dr. Toni Amendlapacs, writer provided the pt. with information and instructions on how to access [facility])   Patient denies SI/HI and AV/H.    01/09/2016 Cheryl FlashNicole Kyrstin Campillo, MS, NCC, LPCA Therapeutic Triage Specialist

## 2016-01-16 ENCOUNTER — Emergency Department
Admission: EM | Admit: 2016-01-16 | Discharge: 2016-01-19 | Disposition: A | Discharge status: Home / Self Care | Payer: Self-pay | Attending: Emergency Medicine | Admitting: Emergency Medicine

## 2016-01-16 DIAGNOSIS — F331 Major depressive disorder, recurrent, moderate: Secondary | ICD-10-CM

## 2016-01-16 DIAGNOSIS — R4589 Other symptoms and signs involving emotional state: Secondary | ICD-10-CM

## 2016-01-16 DIAGNOSIS — R4689 Other symptoms and signs involving appearance and behavior: Secondary | ICD-10-CM

## 2016-01-16 DIAGNOSIS — F17210 Nicotine dependence, cigarettes, uncomplicated: Secondary | ICD-10-CM | POA: Insufficient documentation

## 2016-01-16 DIAGNOSIS — F10120 Alcohol abuse with intoxication, uncomplicated: Secondary | ICD-10-CM | POA: Insufficient documentation

## 2016-01-16 DIAGNOSIS — F10920 Alcohol use, unspecified with intoxication, uncomplicated: Secondary | ICD-10-CM

## 2016-01-16 DIAGNOSIS — F1120 Opioid dependence, uncomplicated: Secondary | ICD-10-CM | POA: Diagnosis present

## 2016-01-16 DIAGNOSIS — T50902A Poisoning by unspecified drugs, medicaments and biological substances, intentional self-harm, initial encounter: Secondary | ICD-10-CM

## 2016-01-16 DIAGNOSIS — Z8673 Personal history of transient ischemic attack (TIA), and cerebral infarction without residual deficits: Secondary | ICD-10-CM | POA: Insufficient documentation

## 2016-01-16 DIAGNOSIS — T481X2A Poisoning by skeletal muscle relaxants [neuromuscular blocking agents], intentional self-harm, initial encounter: Secondary | ICD-10-CM | POA: Insufficient documentation

## 2016-01-16 DIAGNOSIS — F1020 Alcohol dependence, uncomplicated: Secondary | ICD-10-CM | POA: Diagnosis present

## 2016-01-16 DIAGNOSIS — T43592A Poisoning by other antipsychotics and neuroleptics, intentional self-harm, initial encounter: Secondary | ICD-10-CM | POA: Insufficient documentation

## 2016-01-16 DIAGNOSIS — Z79899 Other long term (current) drug therapy: Secondary | ICD-10-CM | POA: Insufficient documentation

## 2016-01-16 DIAGNOSIS — F4310 Post-traumatic stress disorder, unspecified: Secondary | ICD-10-CM | POA: Diagnosis present

## 2016-01-16 DIAGNOSIS — F10929 Alcohol use, unspecified with intoxication, unspecified: Secondary | ICD-10-CM | POA: Diagnosis present

## 2016-01-16 DIAGNOSIS — F603 Borderline personality disorder: Secondary | ICD-10-CM | POA: Diagnosis present

## 2016-01-16 LAB — COMPREHENSIVE METABOLIC PANEL
ALT: 36 U/L (ref 14–54)
AST: 31 U/L (ref 15–41)
Albumin: 4.1 g/dL (ref 3.5–5.0)
Alkaline Phosphatase: 83 U/L (ref 38–126)
Anion gap: 11 (ref 5–15)
BUN: 8 mg/dL (ref 6–20)
CHLORIDE: 110 mmol/L (ref 101–111)
CO2: 22 mmol/L (ref 22–32)
Calcium: 8.8 mg/dL — ABNORMAL LOW (ref 8.9–10.3)
Creatinine, Ser: 0.58 mg/dL (ref 0.44–1.00)
Glucose, Bld: 128 mg/dL — ABNORMAL HIGH (ref 65–99)
POTASSIUM: 3.3 mmol/L — AB (ref 3.5–5.1)
SODIUM: 143 mmol/L (ref 135–145)
Total Bilirubin: <0.1 mg/dL — ABNORMAL LOW (ref 0.3–1.2)
Total Protein: 7.8 g/dL (ref 6.5–8.1)

## 2016-01-16 LAB — ACETAMINOPHEN LEVEL: Acetaminophen (Tylenol), Serum: 10 ug/mL (ref 10–30)

## 2016-01-16 LAB — CBC
HEMATOCRIT: 39.3 % (ref 35.0–47.0)
HEMOGLOBIN: 13.6 g/dL (ref 12.0–16.0)
MCH: 31.9 pg (ref 26.0–34.0)
MCHC: 34.5 g/dL (ref 32.0–36.0)
MCV: 92.4 fL (ref 80.0–100.0)
Platelets: 268 10*3/uL (ref 150–440)
RBC: 4.25 MIL/uL (ref 3.80–5.20)
RDW: 12.6 % (ref 11.5–14.5)
WBC: 8.7 10*3/uL (ref 3.6–11.0)

## 2016-01-16 LAB — ETHANOL: ALCOHOL ETHYL (B): 323 mg/dL — AB (ref <5)

## 2016-01-16 LAB — URINE DRUG SCREEN, QUALITATIVE (ARMC ONLY)
AMPHETAMINES, UR SCREEN: NOT DETECTED
Barbiturates, Ur Screen: NOT DETECTED
Benzodiazepine, Ur Scrn: NOT DETECTED
COCAINE METABOLITE, UR ~~LOC~~: NOT DETECTED
Cannabinoid 50 Ng, Ur ~~LOC~~: NOT DETECTED
MDMA (Ecstasy)Ur Screen: NOT DETECTED
METHADONE SCREEN, URINE: NOT DETECTED
OPIATE, UR SCREEN: POSITIVE — AB
Phencyclidine (PCP) Ur S: NOT DETECTED
TRICYCLIC, UR SCREEN: POSITIVE — AB

## 2016-01-16 LAB — SALICYLATE LEVEL: Salicylate Lvl: <4 mg/dL (ref 2.8–30.0)

## 2016-01-16 MED ORDER — LORAZEPAM 2 MG PO TABS
0.0000 mg | ORAL_TABLET | Freq: Four times a day (QID) | ORAL | Status: AC
  Administered 2016-01-17 – 2016-01-18 (×2): 1 mg via ORAL
  Filled 2016-01-16: qty 1

## 2016-01-16 MED ORDER — SODIUM CHLORIDE 0.9 % IV BOLUS (SEPSIS)
1000.0000 mL | Freq: Once | INTRAVENOUS | Status: AC
  Administered 2016-01-16: 1000 mL via INTRAVENOUS

## 2016-01-16 MED ORDER — VITAMIN B-1 100 MG PO TABS
100.0000 mg | ORAL_TABLET | Freq: Every day | ORAL | Status: DC
Start: 2016-01-16 — End: 2016-01-19
  Administered 2016-01-17 – 2016-01-18 (×2): 100 mg via ORAL
  Filled 2016-01-16 (×4): qty 1

## 2016-01-16 MED ORDER — MIRTAZAPINE 15 MG PO TABS
ORAL_TABLET | ORAL | Status: AC
  Administered 2016-01-16: 15 mg via ORAL
  Filled 2016-01-16: qty 1

## 2016-01-16 MED ORDER — DIPHENHYDRAMINE HCL 25 MG PO CAPS
ORAL_CAPSULE | ORAL | Status: AC
  Administered 2016-01-17: 50 mg via ORAL
  Filled 2016-01-16: qty 2

## 2016-01-16 MED ORDER — CHARCOAL ACTIVATED PO LIQD
50.0000 g | Freq: Once | ORAL | Status: AC
  Administered 2016-01-16: 50 g via ORAL
  Filled 2016-01-16: qty 240

## 2016-01-16 MED ORDER — MIRTAZAPINE 15 MG PO TABS
15.0000 mg | ORAL_TABLET | Freq: Every day | ORAL | Status: DC
  Administered 2016-01-16 – 2016-01-18 (×3): 15 mg via ORAL
  Filled 2016-01-16 (×2): qty 1

## 2016-01-16 MED ORDER — M.V.I. ADULT IV INJ
INJECTION | Freq: Once | INTRAVENOUS | Status: AC
  Administered 2016-01-16: 06:00:00 via INTRAVENOUS
  Filled 2016-01-16: qty 1000

## 2016-01-16 MED ORDER — IBUPROFEN 400 MG PO TABS
400.0000 mg | ORAL_TABLET | Freq: Once | ORAL | Status: AC
  Administered 2016-01-16: 400 mg via ORAL
  Filled 2016-01-16: qty 1

## 2016-01-16 MED ORDER — ONDANSETRON HCL 4 MG/2ML IJ SOLN
4.0000 mg | Freq: Once | INTRAMUSCULAR | Status: AC
  Administered 2016-01-16: 4 mg via INTRAVENOUS
  Filled 2016-01-16: qty 2

## 2016-01-16 MED ORDER — DIPHENHYDRAMINE HCL 25 MG PO CAPS
50.0000 mg | ORAL_CAPSULE | Freq: Once | ORAL | Status: AC
  Administered 2016-01-17: 50 mg via ORAL

## 2016-01-16 NOTE — ED Provider Notes (Signed)
[facility] Emergency Department Provider Note   ____________________________________________  Time seen: Approximately 3:13 AM  I have reviewed the triage vital signs and the nursing notes.   HISTORY  Chief Complaint Medical Clearance and Suicidal    HPI Sandy Mason is a 40 y.o. female who presents to the ED from home via EMS with a chief complaint of depression with suicidal thoughts and intentional overdose. Patient has a history of recurrent depressive disorder with intents of self harm, well-known to the emergency department, who states that she has had to much to drink and she does not want to live. Reports plan to overdose on cocaine. States she took 3 meloxicam, 3 Flexeril, 3 risperdal over the course of the evening with intent for self-harm. Denies HI/AH/VH. Voices no medical complaints.   Past Medical History  Diagnosis Date  . CVA (cerebral infarction)   . Seizures (HCC)   . Depression   . Anxiety   . Suicide attempt by hanging (HCC)   . Alcohol use disorder severe. 12/18/2014  . 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 Borderline personality disorder 12/18/2014  . Opioid use disorder, severe, dependence (HCC) 12/20/2014  . 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 PTSD (post-traumatic stress disorder) 03/28/2015    Patient Active Problem List   Diagnosis Date Noted  . Alcohol intoxication (HCC)   . Severe episode of recurrent major depressive disorder, without psychotic features (HCC)   . Malingering 12/16/2015  . Major depressive disorder, recurrent episode, moderate (HCC) 12/12/2015  . Closed pelvic fracture (HCC) 10/31/2015  . Tobacco abuse 10/31/2015  . Tobacco use disorder 03/28/2015  . PTSD (post-traumatic stress disorder) 03/28/2015  . Stimulant use disorder (cocaine) 03/28/2015  . Sedative, hypnotic or anxiolytic use disorder, severe,  dependence (HCC) 03/27/2015  . Opioid use disorder, severe, dependence (HCC) 12/20/2014  . Alcohol use disorder severe. 12/18/2014  . Borderline personality disorder 12/18/2014    No past surgical history on file.  Current Outpatient Rx  Name  Route  Sig  Dispense  Refill  . cyclobenzaprine (FLEXERIL) 5 MG tablet   Oral   Take 1 tablet (5 mg total) by mouth 3 (three) times daily as needed for muscle spasms.   15 tablet   0   . gabapentin (NEURONTIN) 300 MG capsule   Oral   Take 3 capsules (900 mg total) by mouth 3 (three) times daily.   120 capsule   0   . hydrOXYzine (VISTARIL) 25 MG capsule   Oral   Take 1 capsule (25 mg total) by mouth 3 (three) times daily as needed.   30 capsule   0   . lidocaine (LIDODERM) 5 %   Transdermal   Place 2 patches onto the skin daily. Remove & Discard patch within 12 hours or as directed by MD   60 patch   0   . meloxicam (MOBIC) 7.5 MG tablet   Oral   Take 1 tablet (7.5 mg total) by mouth 2 (two) times daily with a meal.   60 tablet   0   . mirtazapine (REMERON) 15 MG tablet   Oral   Take 1 tablet (15 mg total) by mouth at bedtime.   30 tablet   0   . prazosin (MINIPRESS) 2 MG capsule   Oral   Take 1 capsule (2 mg total) by mouth at bedtime.   30 capsule   0   .  venlafaxine XR (EFFEXOR-XR) 150 MG 24 hr capsule   Oral   Take 2 capsules (300 mg total) by mouth daily with breakfast.   30 capsule   0   . nitrofurantoin, macrocrystal-monohydrate, (MACROBID) 100 MG capsule   Oral   Take 1 capsule (100 mg total) by mouth every 12 (twelve) hours. Patient not taking: Reported on 01/16/2016   3 capsule   0     Allergies Depakote; Haldol; and Keflet  Family History  Problem Relation Age of Onset  . Diabetes Father     Social History Social History  Substance Use Topics  . Smoking status: Current Every Day Smoker -- 1.00 packs/day    Types: Cigarettes  . Smokeless tobacco: Not on file  . Alcohol Use: 3.6 oz/week     6 Cans of beer per week     Comment: tonight 6     Review of Systems  Constitutional: No fever/chills. Eyes: No visual changes. ENT: No sore throat. Cardiovascular: Denies chest pain. Respiratory: Denies shortness of breath. Gastrointestinal: No abdominal pain.  No nausea, no vomiting.  No diarrhea.  No constipation. Genitourinary: Negative for dysuria. Musculoskeletal: Negative for back pain. Skin: Negative for rash. Neurological: Negative for headaches, focal weakness or numbness. Psychiatric:Positive for depression with suicidal gesture.  10-point ROS otherwise negative.  ____________________________________________   PHYSICAL EXAM:  VITAL SIGNS: ED Triage Vitals  Enc Vitals Group     BP 01/16/16 0227 132/72 mmHg     Pulse Rate 01/16/16 0227 134     Resp 01/16/16 0227 24     Temp 01/16/16 0227 98.3 F (36.8 C)     Temp Source 01/16/16 0227 Oral     SpO2 01/16/16 0227 93 %     Weight 01/16/16 0227 232 lb (105.235 kg)     Height 01/16/16 0227 5\' 8"  (1.727 m)     Head Cir --      Peak Flow --      Pain Score 01/16/16 0224 10     Pain Loc --      Pain Edu? --      Excl. in GC? --     Constitutional: Alert and oriented. Well appearing and in no acute distress. Tearful. Eyes: Conjunctivae are normal. PERRL. EOMI. Head: Atraumatic. Nose: No congestion/rhinnorhea. Mouth/Throat: Mucous membranes are moist.  Oropharynx non-erythematous. Neck: No stridor.   Cardiovascular: Tachycardic rate, regular rhythm. Grossly normal heart sounds.  Good peripheral circulation. Respiratory: Normal respiratory effort.  No retractions. Lungs CTAB. Gastrointestinal: Obese. Soft and nontender. No distention. No abdominal bruits. No CVA tenderness. Musculoskeletal: No lower extremity tenderness nor edema.  No joint effusions. Neurologic:  Normal speech and language. No gross focal neurologic deficits are appreciated. No gait instability. Skin:  Skin is warm, dry and intact. No rash  noted. Psychiatric: Mood and affect are tearful. Speech and behavior are normal.  ____________________________________________   LABS (all labs ordered are listed, but only abnormal results are displayed)  Labs Reviewed  COMPREHENSIVE METABOLIC PANEL - Abnormal; Notable for the following:    Potassium 3.3 (*)    Glucose, Bld 128 (*)    Calcium 8.8 (*)    Total Bilirubin <0.1 (*)    All other components within normal limits  ETHANOL - Abnormal; Notable for the following:    Alcohol, Ethyl (B) 323 (*)    All other components within normal limits  URINE DRUG SCREEN, QUALITATIVE ([facility] ONLY) - Abnormal; Notable for the following:    Tricyclic, Ur Screen  POSITIVE (*)    Opiate, Ur Screen POSITIVE (*)    All other components within normal limits  SALICYLATE LEVEL  ACETAMINOPHEN LEVEL  CBC  ACETAMINOPHEN LEVEL  SALICYLATE LEVEL   ____________________________________________  EKG  ED ECG REPORT I, Danile Trier J, the attending physician, personally viewed and interpreted this ECG.   Date: 01/16/2016  EKG Time: 0316  Rate: 128  Rhythm: sinus tachycardia  Axis: Normal  Intervals:none  ST&T Change: Nonspecific  ____________________________________________  RADIOLOGY  None ____________________________________________   PROCEDURES  Procedure(s) performed: None  Critical Care performed: No  ____________________________________________   INITIAL IMPRESSION / ASSESSMENT AND PLAN / ED COURSE  Pertinent labs & imaging results that were available during my care of the patient were reviewed by me and considered in my medical decision making (see chart for details).  40 year old female with recurrent depressive episodes who presents intoxicated with intentional overdose. Patient declines to contract for safety in the emergency department. Will place patient under involuntary commitment for her safety. Will obtain screening toxicological labwork including repeat four-hour  levels of acetaminophen and salicylate, initiate IV fluid resuscitation and consult TTS and psychiatry to evaluate patient in the emergency department. Will administer charcoal. She will remain in a hallway bed with safety sitter as she attempted to hang herself in her treatment room the last time she was in the emergency department.  ----------------------------------------- 5:22 AM on 01/16/2016 -----------------------------------------  Patient having small amount of emesis. Will administer IV Zofran.  ----------------------------------------- 8:11 AM on 01/16/2016 -----------------------------------------  No further emesis. Patient resting quietly in no acute distress. Awaiting repeat four-hour acetaminophen and salicylate levels. Patient will remain under IVC in the emergency department pending psychiatry evaluation this morning. Care transferred to Dr. Langston Masker. ____________________________________________   FINAL CLINICAL IMPRESSION(S) / ED DIAGNOSES  Final diagnoses:  Alcohol intoxication, uncomplicated (HCC)  Major depressive disorder, recurrent episode, moderate (HCC)  Overdose, intentional self-harm, initial encounter (HCC)      NEW MEDICATIONS STARTED DURING THIS VISIT:  New Prescriptions   No medications on file     Note:  This document was prepared using Dragon voice recognition software and may include unintentional dictation errors.    Irean Hong, MD 01/16/16 [phone]

## 2016-01-16 NOTE — ED Notes (Signed)
Pt up to BR

## 2016-01-16 NOTE — ED Notes (Signed)
Pt upto the restroom , steady gait

## 2016-01-16 NOTE — ED Notes (Signed)
Pt states that she  °

## 2016-01-16 NOTE — ED Notes (Signed)
Dr Dolores FrameSung notified of critical lab value - ETOH 323.  Acknowledged.

## 2016-01-16 NOTE — Consult Note (Signed)
[facility] Consult   Reason for Consult:  Consult for 40 year old woman well known to the emergency room who presented after an overdose also intoxicated Referring Physician:  Clearnce Hasten Patient Identification: [place] INTHAVONG MRN:  [id number] Principal Diagnosis: Major depressive disorder, recurrent episode, moderate ([place]) Diagnosis:   Patient Active Problem List   Diagnosis Date Noted  . Suicidal behavior [F48.9] 01/16/2016  . Alcohol intoxication ([place]) [F10.129]   . Severe episode of recurrent major depressive disorder, without psychotic features ([place]) [F33.2]   . Malingering [Z76.5] 12/16/2015  . Major depressive disorder, recurrent episode, moderate ([place]) [F33.1] 12/12/2015  . Closed pelvic fracture ([place]) [S32.9XXA] 10/31/2015  . Tobacco abuse [Z72.0] 10/31/2015  . Tobacco use disorder [F17.200] 03/28/2015  . PTSD (post-traumatic stress disorder) [F43.10] 03/28/2015  . Stimulant use disorder (cocaine) [F15.90] 03/28/2015  . Sedative, hypnotic or anxiolytic use disorder, severe, dependence ([place]) [F13.20] 03/27/2015  . Opioid use disorder, severe, dependence ([place]) [F11.20] 12/20/2014  . Alcohol use disorder severe. [F10.20] 12/18/2014  . Borderline personality disorder [F60.3] 12/18/2014    Total Time spent with patient: 45 minutes  Subjective:   Sandy Mason is a 40 y.o. female patient admitted with "I took a whole bunch of pills".  HPI:  Patient interviewed. Chart reviewed. Labs and vitals reviewed. Case discussed with ER doctor and TTS. 40 year old woman with history of substance abuse and personality disorder. Claims that she took an entire bottle of risperidone. Said she did it because she just "didn't want to be here". Says she still wants to die. Apparently a neighbor or friend saw her do it and called for help. Patient admits that she had been drinking on the day in question. She had a blood alcohol level over 300 on presentation. She denied she was  using any other drugs but her drug screen is positive for opiates. Patient claims that she had gone to [place] to begin treatment. Mood however she said remained depressed down and flat. Sleep poor. Frequent suicidal thoughts. Denies hallucinations.  Social history: Says she that she moved out of the problematic apartment and is now staying in a boarding house but that situation is unsatisfactory as well. Not working.  Medical history: History of multiple overdoses. Minimal ongoing active actual medical problems.  Substance abuse history: Long established history of alcohol and opiate abuse as well as intermittent abuse of other drugs.  Past Psychiatric History: Multiple presentations to the emergency room. Diagnosed variously as depressed bipolar PTSD and personality disorder. Multiple prior suicide attempts. Has been considered in the past to be minimally likely benefit from inpatient treatment because of her behavior problems and substance abuse. Resist most recommended outpatient treatment. Unclear whether medicines of been of any benefit.  Risk to Self: Suicidal Ideation: Yes-Currently Present Suicidal Intent: Yes-Currently Present Is patient at risk for suicide?: Yes Suicidal Plan?: Yes-Currently Present Specify Current Suicidal Plan: Pt reports she took an overdose of pills Access to Means: Yes Specify Access to Suicidal Means: Pt has access to precription meds and illicit drugs What has been your use of drugs/alcohol within the last 12 months?: alcohol  How many times?: 2 Other Self Harm Risks: None identified Triggers for Past Attempts: Unknown Intentional Self Injurious Behavior: None Risk to Others: Homicidal Ideation: No Thoughts of Harm to Others: No Current Homicidal Intent: No Current Homicidal Plan: No Access to Homicidal Means: No Identified Victim: None identified History of harm to others?: No Assessment of Violence: None Noted Violent Behavior Description: None  identified Does  patient have access to weapons?: No Criminal Charges Pending?: No Does patient have a court date: No Prior Inpatient Therapy: Prior Inpatient Therapy: Yes Prior Therapy Dates: 11/2015 Prior Therapy Facilty/Provider(s): [facility] Reason for Treatment: PTSD Prior Outpatient Therapy: Prior Outpatient Therapy: Yes Prior Therapy Dates: current Prior Therapy Facilty/Provider(s): [facility] Reason for Treatment: ptsd Does patient have an ACCT team?: No Does patient have Intensive In-House Services?  : No Does patient have [facility] services? : No Does patient have P4CC services?: No  Past Medical History:  Past Medical History  Diagnosis Date  . CVA (cerebral infarction)   . Seizures ([place])   . Depression   . Anxiety   . Suicide attempt by hanging ([place])   . Alcohol use disorder severe. 12/18/2014  . 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 Borderline personality disorder 12/18/2014  . Opioid use disorder, severe, dependence ([place]) 12/20/2014  . Sedative, hypnotic or anxiolytic use disorder, severe, dependence ([place]) 03/27/2015  . PTSD (post-traumatic stress disorder) 03/28/2015   No past surgical history on file. Family History:  Family History  Problem Relation Age of Onset  . Diabetes Father    Family Psychiatric  History: Positive for substance abuse Social History:  History  Alcohol Use  . 3.6 oz/week  . 6 Cans of beer per week    Comment: tonight 6      History  Drug Use  . Yes  . Special: Benzodiazepines, Hydrocodone    Social History   Social History  . Marital Status: Single    Spouse Name: N/A  . Number of Children: N/A  . Years of Education: N/A   Social History Main Topics  . Smoking status: Current Every Day Smoker -- 1.00 packs/day    Types: Cigarettes  . Smokeless tobacco: Not on file  . Alcohol Use: 3.6 oz/week    6 Cans of beer per week     Comment: tonight 6   . Drug Use: Yes    Special:  Benzodiazepines, Hydrocodone  . Sexual Activity: Yes    Birth Control/ Protection: IUD   Other Topics Concern  . Not on file   Social History Narrative   Additional Social History:    Allergies:   Allergies  Allergen Reactions  . Depakote [Valproic Acid] Other (See Comments)    Reaction:  Seizures   . Haldol [Haloperidol Lactate] Other (See Comments)    Reaction:  Seizures   . Keflet [Cephalexin] Rash    Labs:  Results for orders placed or performed during the hospital encounter of 01/16/16 (from the past 48 hour(s))  Comprehensive metabolic panel     Status: Abnormal   Collection Time: 01/16/16  2:28 AM  Result Value Ref Range   Sodium 143 135 - 145 mmol/L   Potassium 3.3 (L) 3.5 - 5.1 mmol/L   Chloride 110 101 - 111 mmol/L   CO2 22 22 - 32 mmol/L   Glucose, Bld 128 (H) 65 - 99 mg/dL   BUN 8 6 - 20 mg/dL   Creatinine, Ser 0.58 0.44 - 1.00 mg/dL   Calcium 8.8 (L) 8.9 - 10.3 mg/dL   Total Protein 7.8 6.5 - 8.1 g/dL   Albumin 4.1 3.5 - 5.0 g/dL   AST 31 15 - 41 U/L   ALT 36 14 - 54 U/L   Alkaline Phosphatase 83 38 - 126 U/L   Total Bilirubin <0.1 (L) 0.3 - 1.2 mg/dL   GFR calc non Af Amer >60 >60 mL/min  GFR calc Af Amer >60 >60 mL/min    Comment: (NOTE) The eGFR has been calculated using the CKD EPI equation. This calculation has not been validated in all clinical situations. eGFR's persistently <60 mL/min signify possible Chronic Kidney Disease.    Anion gap 11 5 - 15  Ethanol     Status: Abnormal   Collection Time: 01/16/16  2:28 AM  Result Value Ref Range   Alcohol, Ethyl (B) 323 (HH) <5 mg/dL    Comment: CRITICAL RESULT CALLED TO, READ BACK BY AND VERIFIED WITH [facility] AT 4718 01/16/16.PMH        LOWEST DETECTABLE LIMIT FOR SERUM ALCOHOL IS 5 mg/dL FOR MEDICAL PURPOSES ONLY   Salicylate level     Status: None   Collection Time: 01/16/16  2:28 AM  Result Value Ref Range   Salicylate Lvl <5.5 2.8 - 30.0 mg/dL  Acetaminophen level     Status: None    Collection Time: 01/16/16  2:28 AM  Result Value Ref Range   Acetaminophen (Tylenol), Serum 10 10 - 30 ug/mL    Comment:        THERAPEUTIC CONCENTRATIONS VARY SIGNIFICANTLY. A RANGE OF 10-30 ug/mL MAY BE AN EFFECTIVE CONCENTRATION FOR MANY PATIENTS. HOWEVER, SOME ARE BEST TREATED AT CONCENTRATIONS OUTSIDE THIS RANGE. ACETAMINOPHEN CONCENTRATIONS >150 ug/mL AT 4 HOURS AFTER INGESTION AND >50 ug/mL AT 12 HOURS AFTER INGESTION ARE OFTEN ASSOCIATED WITH TOXIC REACTIONS.   cbc     Status: None   Collection Time: 01/16/16  2:28 AM  Result Value Ref Range   WBC 8.7 3.6 - 11.0 K/uL   RBC 4.25 3.80 - 5.20 MIL/uL   Hemoglobin 13.6 12.0 - 16.0 g/dL   HCT 39.3 35.0 - 47.0 %   MCV 92.4 80.0 - 100.0 fL   MCH 31.9 26.0 - 34.0 pg   MCHC 34.5 32.0 - 36.0 g/dL   RDW 12.6 11.5 - 14.5 %   Platelets 268 150 - 440 K/uL  Urine Drug Screen, Qualitative     Status: Abnormal   Collection Time: 01/16/16  2:28 AM  Result Value Ref Range   Tricyclic, Ur Screen POSITIVE (A) NONE DETECTED   Amphetamines, Ur Screen NONE DETECTED NONE DETECTED   MDMA (Ecstasy)Ur Screen NONE DETECTED NONE DETECTED   Cocaine Metabolite,Ur [place] NONE DETECTED NONE DETECTED   Opiate, Ur Screen POSITIVE (A) NONE DETECTED   Phencyclidine (PCP) Ur S NONE DETECTED NONE DETECTED   Cannabinoid 50 Ng, Ur [place] NONE DETECTED NONE DETECTED   Barbiturates, Ur Screen NONE DETECTED NONE DETECTED   Benzodiazepine, Ur Scrn NONE DETECTED NONE DETECTED   Methadone Scn, Ur NONE DETECTED NONE DETECTED    Comment: (NOTE) 015  Tricyclics, urine               Cutoff 1000 ng/mL 200  Amphetamines, urine             Cutoff 1000 ng/mL 300  MDMA (Ecstasy), urine           Cutoff 500 ng/mL 400  Cocaine Metabolite, urine       Cutoff 300 ng/mL 500  Opiate, urine                   Cutoff 300 ng/mL 600  Phencyclidine (PCP), urine      Cutoff 25 ng/mL 700  Cannabinoid, urine              Cutoff 50 ng/mL 800  Barbiturates, urine  Cutoff 200  ng/mL 900  Benzodiazepine, urine           Cutoff 200 ng/mL 1000 Methadone, urine                Cutoff 300 ng/mL 1100 1200 The urine drug screen provides only a preliminary, unconfirmed 1300 analytical test result and should not be used for non-medical 1400 purposes. Clinical consideration and professional judgment should 1500 be applied to any positive drug screen result due to possible 1600 interfering substances. A more specific alternate chemical method 1700 must be used in order to obtain a confirmed analytical result.  1800 Gas chromato graphy / mass spectrometry (GC/MS) is the preferred 1900 confirmatory method.   Acetaminophen level     Status: Abnormal   Collection Time: 01/16/16  7:35 AM  Result Value Ref Range   Acetaminophen (Tylenol), Serum <10 (L) 10 - 30 ug/mL    Comment:        THERAPEUTIC CONCENTRATIONS VARY SIGNIFICANTLY. A RANGE OF 10-30 ug/mL MAY BE AN EFFECTIVE CONCENTRATION FOR MANY PATIENTS. HOWEVER, SOME ARE BEST TREATED AT CONCENTRATIONS OUTSIDE THIS RANGE. ACETAMINOPHEN CONCENTRATIONS >150 ug/mL AT 4 HOURS AFTER INGESTION AND >50 ug/mL AT 12 HOURS AFTER INGESTION ARE OFTEN ASSOCIATED WITH TOXIC REACTIONS.   Salicylate level     Status: None   Collection Time: 01/16/16  7:35 AM  Result Value Ref Range   Salicylate Lvl <0.1 2.8 - 30.0 mg/dL    Current Facility-Administered Medications  Medication Dose Route Frequency Provider Last Rate Last Dose  . LORazepam (ATIVAN) tablet 0-4 mg  0-4 mg Oral Q6H Paulette Blanch, MD   0 mg at 01/16/16 0814  . thiamine (VITAMIN B-1) tablet 100 mg  100 mg Oral Daily Paulette Blanch, MD   100 mg at 01/16/16 1454   Current Outpatient Prescriptions  Medication Sig Dispense Refill  . cyclobenzaprine (FLEXERIL) 5 MG tablet Take 1 tablet (5 mg total) by mouth 3 (three) times daily as needed for muscle spasms. 15 tablet 0  . gabapentin (NEURONTIN) 300 MG capsule Take 3 capsules (900 mg total) by mouth 3 (three) times daily. 120  capsule 0  . hydrOXYzine (VISTARIL) 25 MG capsule Take 1 capsule (25 mg total) by mouth 3 (three) times daily as needed. 30 capsule 0  . lidocaine (LIDODERM) 5 % Place 2 patches onto the skin daily. Remove & Discard patch within 12 hours or as directed by MD 60 patch 0  . meloxicam (MOBIC) 7.5 MG tablet Take 1 tablet (7.5 mg total) by mouth 2 (two) times daily with a meal. 60 tablet 0  . mirtazapine (REMERON) 15 MG tablet Take 1 tablet (15 mg total) by mouth at bedtime. 30 tablet 0  . prazosin (MINIPRESS) 2 MG capsule Take 1 capsule (2 mg total) by mouth at bedtime. 30 capsule 0  . venlafaxine XR (EFFEXOR-XR) 150 MG 24 hr capsule Take 2 capsules (300 mg total) by mouth daily with breakfast. 30 capsule 0  . nitrofurantoin, macrocrystal-monohydrate, (MACROBID) 100 MG capsule Take 1 capsule (100 mg total) by mouth every 12 (twelve) hours. (Patient not taking: Reported on 01/16/2016) 3 capsule 0    Musculoskeletal: Strength & Muscle Tone: decreased Gait & Station: unable to stand Patient leans: N/A  Psychiatric Specialty Exam: Physical Exam  Nursing note and vitals reviewed. Constitutional: She appears well-developed and well-nourished.  HENT:  Head: Normocephalic and atraumatic.  Eyes: Conjunctivae are normal. Pupils are equal, round, and reactive to light.  Neck: Normal range  of motion.  Cardiovascular: Normal heart sounds.   Respiratory: Effort normal. No respiratory distress.  GI: Soft.  Musculoskeletal: Normal range of motion.  Neurological: She is alert.  Skin: Skin is warm and dry.  Psychiatric: Her affect is blunt. Her speech is delayed. She is withdrawn. Cognition and memory are impaired. She expresses impulsivity. She exhibits a depressed mood. She expresses suicidal ideation.    Review of Systems  Constitutional: Negative.   HENT: Negative.   Eyes: Negative.   Respiratory: Negative.   Cardiovascular: Negative.   Gastrointestinal: Negative.   Musculoskeletal: Negative.    Skin: Negative.   Neurological: Negative.   Psychiatric/Behavioral: Positive for depression, suicidal ideas, memory loss and substance abuse. Negative for hallucinations. The patient has insomnia. The patient is not nervous/anxious.     Blood pressure 136/82, pulse 124, temperature 98.7 F (37.1 C), temperature source Oral, resp. rate 24, height '5\' 8"'  (1.727 m), weight 105.235 kg (232 lb), SpO2 92 %.Body mass index is 35.28 kg/(m^2).  General Appearance: Disheveled  Eye Contact:  Absent  Speech:  Garbled  Volume:  Decreased  Mood:  Depressed  Affect:  Blunt  Thought Process:  Goal Directed  Orientation:  Full (Time, Place, and Person)  Thought Content:  Rumination  Suicidal Thoughts:  Yes.  with intent/plan  Homicidal Thoughts:  No  Memory:  Immediate;   Fair Recent;   Poor Remote;   Fair  Judgement:  Impaired  Insight:  Shallow  Psychomotor Activity:  Decreased  Concentration:  Concentration: Fair  Recall:  [employer] of Knowledge:  Fair  Language:  Fair  Akathisia:  No  Handed:  Right  AIMS (if indicated):     Assets:  Physical Health  ADL's:  Intact  Cognition:  WNL  Sleep:        Treatment Plan Summary: Plan 40 year old woman came in after an overdose on risperidone. She continues to state suicidal ideation. Intoxicated on presentation. Patient had only recently been discharged from the emergency room and when she was discharged last time was essentially telling us that this was likely to be her outcome again. She claims that she tried to get involved with outpatient mental health treatment. She presents as being hopeless and apparently unwilling to do much to try to improve her situation. Still intoxicated at this point. We will have to reevaluate as she sobers up. I have discussed her with TTS and the idea is proposed to try referring her again to the alcohol and drug abuse treatment center. We will defer admission decision at this point until either ADAC admission  occurs or she sobers up And can be reevaluated  Disposition: Supportive therapy provided about ongoing stressors. Discussed crisis plan, support from social network, calling 911, coming to the Emergency Department, and calling Suicide Hotline.  Alethia Berthold, MD 01/16/2016 3:28 PM

## 2016-01-16 NOTE — ED Notes (Signed)
Patient states "I've had a little bit too much to drink and I really, really don't won't to live."  Patient states has a plan to over dose on cocaine.  Patient will not contract for safety, states "if I can find a way to hurt myself I will."

## 2016-01-16 NOTE — ED Notes (Signed)
PO fluid requested, pt received fluids, tolerating.

## 2016-01-16 NOTE — ED Notes (Signed)
Pt complaint of headache, pt placed in darkened room from the hallway

## 2016-01-16 NOTE — ED Notes (Signed)
Pt reports to this RN that [facility] and they will not let her downstairs so she is not sure what to do for help. Pt states that she took approx 3 pills of Oxycodone, Meloxicam, Flexeril, Risperdal, plus 120-150 oz of beer. Pt states that she will use or do anything to hurt herself at this time.

## 2016-01-16 NOTE — ED Notes (Signed)
Pt with a steady gait , no distress , ambulatory to restrooms

## 2016-01-16 NOTE — BH Assessment (Signed)
Assessment Note  Sandy Mason is an 40 y.o. female presenting to the ED for alcohol intoxication and reportedly overdosing on prescription meds.  Patient reports that she had too much drink and also took " a bunch" of her pills.  Pt states that she doesn't want to live and refused to contract for safety in the ED.  Pt reports if she is discharged that that she will find a way to hurt herself.  Patient has been to the ED multiple times for the same.  Diagnosis: Drug Overdose  Past Medical History:  Past Medical History  Diagnosis Date  . CVA (cerebral infarction)   . Seizures (HCC)   . Depression   . Anxiety   . Suicide attempt by hanging (HCC)   . Alcohol use disorder severe. 12/18/2014  . 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 Borderline personality disorder 12/18/2014  . Opioid use disorder, severe, dependence (HCC) 12/20/2014  . 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 PTSD (post-traumatic stress disorder) 03/28/2015    No past surgical history on file.  Family History:  Family History  Problem Relation Age of Onset  . Diabetes Father     Social History:  reports that she has been smoking Cigarettes.  She has been smoking about 1.00 pack per day. She does not have any smokeless tobacco history on file. She reports that she drinks about 3.6 oz of alcohol per week. She reports that she uses illicit drugs (Benzodiazepines and Hydrocodone).  Additional Social History:  Alcohol / Drug Use History of alcohol / drug use?: Yes  CIWA: CIWA-Ar BP: 132/72 mmHg Pulse Rate: (!) 134 COWS:    Allergies:  Allergies  Allergen Reactions  . Depakote [Valproic Acid] Other (See Comments)    Reaction:  Seizures   . Haldol [Haloperidol Lactate] Other (See Comments)    Reaction:  Seizures   . Keflet [Cephalexin] Rash    Home Medications:  (Not in a hospital admission)  OB/GYN Status:  No LMP  recorded. Patient is not currently having periods (Reason: IUD).  General Assessment Data Location of Assessment: [facility] ED TTS Assessment: In system Is this a Tele or Face-to-Face Assessment?: Face-to-Face Is this an Initial Assessment or a Re-assessment for this encounter?: Initial Assessment Marital status: Single Maiden name: n/a Is patient pregnant?: No Pregnancy Status: No Living Arrangements: Non-relatives/Friends Can pt return to current living arrangement?: Yes Admission Status: Voluntary Is patient capable of signing voluntary admission?: Yes Referral Source: Self/Family/Friend Insurance type: self pay  Medical Screening Exam [facility] Walk-in ONLY) Medical Exam completed: Yes  Crisis Care Plan Living Arrangements: Non-relatives/Friends Legal Guardian: Other: (self) Name of Psychiatrist: Dr. Suzie Portela Name of Therapist: [facility]  Education Status Is patient currently in school?: No Current Grade: n/a Highest grade of school patient has completed: N/A Name of school: N/A Contact person: N/A  Risk to self with the past 6 months Suicidal Ideation: Yes-Currently Present Has patient been a risk to self within the past 6 months prior to admission? : Yes Suicidal Intent: Yes-Currently Present Has patient had any suicidal intent within the past 6 months prior to admission? : Yes Is patient at risk for suicide?: Yes Suicidal Plan?: Yes-Currently Present Has patient had any suicidal plan within the past 6 months prior to admission? : Yes Specify Current Suicidal Plan: Pt reports she took an overdose of pills Access to Means: Yes Specify Access to Suicidal Means: Pt has access to precription meds  and illicit drugs What has been your use of drugs/alcohol within the last 12 months?: alcohol  Previous Attempts/Gestures: Yes How many times?: 2 Other Self Harm Risks: None identified Triggers for Past Attempts: Unknown Intentional Self Injurious Behavior: None Family Suicide History:  No Recent stressful life event(s): Financial Problems, Other (Comment) Persecutory voices/beliefs?: No Depression: Yes Depression Symptoms: Loss of interest in usual pleasures, Feeling worthless/self pity Substance abuse history and/or treatment for substance abuse?: Yes Suicide prevention information given to non-admitted patients: Not applicable  Risk to Others within the past 6 months Homicidal Ideation: No Does patient have any lifetime risk of violence toward others beyond the six months prior to admission? : No Thoughts of Harm to Others: No Current Homicidal Intent: No Current Homicidal Plan: No Access to Homicidal Means: No Identified Victim: None identified History of harm to others?: No Assessment of Violence: None Noted Violent Behavior Description: None identified Does patient have access to weapons?: No Criminal Charges Pending?: No Does patient have a court date: No Is patient on probation?: No  Psychosis Hallucinations: None noted Delusions: None noted  Mental Status Report Appearance/Hygiene: In scrubs Eye Contact: Fair Motor Activity: Agitation Speech: Logical/coherent Level of Consciousness: Alert, Irritable Mood: Depressed Affect: Anxious, Irritable Anxiety Level: Minimal Thought Processes: Relevant Judgement: Impaired Orientation: Person, Place, Time, Situation Obsessive Compulsive Thoughts/Behaviors: None  Cognitive Functioning Concentration: Normal Memory: Recent Intact, Remote Intact IQ: Average Insight: Poor Impulse Control: Poor Appetite: Good Weight Loss: 0 Weight Gain: 0 Sleep: No Change Total Hours of Sleep: 8 Vegetative Symptoms: None  ADLScreening [facility] Assessment [facility]) Patient's cognitive ability adequate to safely complete daily activities?: Yes Patient able to express need for assistance with ADLs?: Yes Independently performs ADLs?: Yes (appropriate for developmental age)  Prior Inpatient Therapy Prior Inpatient Therapy:  Yes Prior Therapy Dates: 11/2015 [facility] Reason for Treatment: PTSD  Prior Outpatient Therapy Prior Outpatient Therapy: Yes Prior Therapy Dates: current Prior Therapy Facilty/Provider(s): [facility] Reason for Treatment: ptsd Does patient have an ACCT team?: No Does patient have Intensive In-House Services?  : No Does patient have [facility] services? : No Does patient have P4CC services?: No  ADL Screening (condition at time of admission) Patient's cognitive ability adequate to safely complete daily activities?: Yes Patient able to express need for assistance with ADLs?: Yes Independently performs ADLs?: Yes (appropriate for developmental age)       Abuse/Neglect Assessment (Assessment to be complete while patient is alone) Physical Abuse: Denies Verbal Abuse: Denies Sexual Abuse: Denies Exploitation of patient/patient's resources: Denies Self-Neglect: Denies Values / Beliefs Cultural Requests During Hospitalization: None Spiritual Requests During Hospitalization: None Consults Spiritual Care Consult Needed: No Social Work Consult Needed: No Merchant navy officerAdvance Directives (For Healthcare) Does patient have an advance directive?: No    Additional Information 1:1 In Past 12 Months?: No CIRT Risk: No Elopement Risk: No Does patient have medical clearance?: Yes     Disposition:  Disposition Initial Assessment Completed for this Encounter: Yes Disposition of Patient: Other dispositions Other disposition(s): Other (Comment) (Pending Psych MD consult)  On Site Evaluation by:   Reviewed with Physician:    Artist Beachoxana C Georgian Mcclory 01/16/2016 5:17 AM

## 2016-01-16 NOTE — ED Notes (Signed)
Pt resting in darkened room, even unlabored respirations, no distress noted

## 2016-01-17 MED ORDER — HYDROXYZINE HCL 25 MG PO TABS
ORAL_TABLET | ORAL | Status: AC
  Filled 2016-01-17: qty 2

## 2016-01-17 MED ORDER — LORAZEPAM 1 MG PO TABS
1.0000 mg | ORAL_TABLET | Freq: Once | ORAL | Status: AC
  Administered 2016-01-17: 1 mg via ORAL

## 2016-01-17 MED ORDER — HYDROXYZINE HCL 25 MG PO TABS
50.0000 mg | ORAL_TABLET | Freq: Once | ORAL | Status: AC
  Administered 2016-01-17: 50 mg via ORAL

## 2016-01-17 MED ORDER — VENLAFAXINE HCL ER 75 MG PO CP24
ORAL_CAPSULE | ORAL | Status: AC
  Filled 2016-01-17: qty 2

## 2016-01-17 MED ORDER — VENLAFAXINE HCL ER 75 MG PO CP24
150.0000 mg | ORAL_CAPSULE | Freq: Every day | ORAL | Status: DC
  Administered 2016-01-17 – 2016-01-18 (×2): 150 mg via ORAL
  Filled 2016-01-17 (×2): qty 2

## 2016-01-17 MED ORDER — VENLAFAXINE HCL ER 75 MG PO CP24
ORAL_CAPSULE | ORAL | Status: AC
  Administered 2016-01-17: 150 mg via ORAL
  Filled 2016-01-17: qty 2

## 2016-01-17 MED ORDER — LORAZEPAM 1 MG PO TABS
ORAL_TABLET | ORAL | Status: AC
  Administered 2016-01-17: 1 mg via ORAL
  Filled 2016-01-17: qty 1

## 2016-01-17 MED ORDER — LORAZEPAM 1 MG PO TABS
ORAL_TABLET | ORAL | Status: AC
  Administered 2016-01-18: 1 mg via ORAL
  Filled 2016-01-17: qty 1

## 2016-01-17 MED ORDER — PRAZOSIN HCL 2 MG PO CAPS
2.0000 mg | ORAL_CAPSULE | Freq: Every day | ORAL | Status: DC
  Administered 2016-01-17 – 2016-01-18 (×2): 2 mg via ORAL
  Filled 2016-01-17 (×2): qty 1

## 2016-01-17 NOTE — ED Notes (Signed)
BEHAVIORAL HEALTH ROUNDING Patient sleeping: No. Patient alert and oriented: yes Behavior appropriate: Yes.  ; If no, describe:  Nutrition and fluids offered: yes Toileting and hygiene offered: Yes  Sitter present: q15 minute observations and security  monitoring Law enforcement present: Yes  ODS  

## 2016-01-17 NOTE — ED Notes (Signed)
Report was received from Ruth B., RN; Pt. Verbalizes no complaints or distress; denies S.I./Hi. Continue to monitor with 15 min. Monitoring. 

## 2016-01-17 NOTE — ED Notes (Signed)
Pt transferred to bhu from main ed pt is waiting a bed at [facility] per report , she has no specific c/o at this time ,went to sleep

## 2016-01-17 NOTE — ED Provider Notes (Signed)
-----------------------------------------   5:53 AM on 01/17/2016 -----------------------------------------   Blood pressure 135/89, pulse 89, temperature 99.2 F (37.3 C), temperature source Oral, resp. rate 17, height 5\' 8"  (1.727 m), weight 232 lb (105.235 kg), SpO2 97 %.  The patient had no acute events since last update.  Calm and cooperative at this time.  Disposition is pending per Psychiatry/Behavioral Medicine team recommendations.     Jennye MoccasinBrian S Quigley, MD 01/17/16 [phone]

## 2016-01-17 NOTE — ED Notes (Signed)
BEHAVIORAL HEALTH ROUNDING  Patient sleeping: Yes Patient alert and oriented: Sleeping Behavior appropriate: Yes. ; If no, describe:  Nutrition and fluids offered: No, sleeping  Toileting and hygiene offered: No, sleeping  Sitter present: q15 minute observations and security monitoring  Law enforcement present: Yes ODS 

## 2016-01-17 NOTE — ED Notes (Signed)
Pt came to nurses station stating that she was suffering and needed ativan and that she was in ach withdrawal , her ciwa score does not indicate that. She states that prior meds of vistaril did nothing and she needs ativan. I will notfiy DR for further orders if needed,still no word on bed placement at [facility]

## 2016-01-17 NOTE — ED Notes (Signed)
Pt out to hallway c/o not being able to sleep despite administration of ordered 50mg  of PO benadryl at 0000. MD notified and verbal order received for 1mg  of PO ativan.

## 2016-01-17 NOTE — ED Notes (Signed)
Pt ambulated to decon shower with no difficulty or distress noted; pt escorted by ED tech and [employer].

## 2016-01-17 NOTE — ED Notes (Signed)

## 2016-01-17 NOTE — ED Notes (Signed)
Pt given breakfast tray

## 2016-01-17 NOTE — ED Notes (Signed)
Pt sleeping. 

## 2016-01-17 NOTE — ED Notes (Signed)
Patient requested to look at the tv; c/o not being able to hear the tv; [facility]

## 2016-01-17 NOTE — BH Specialist Note (Signed)
[place] [facility] Referral Form completed for [facility]  Information faxed to [employer] for Auth #.  Received phone call from [employer] ([phone]) with  Authorization/Tracking Number [id number]).

## 2016-01-17 NOTE — ED Notes (Signed)
Per patient, "I tried to overdose on a bottle of Risperdal; and i drank a lot of Malt liquor; I don't want to go to [facility]; [facility]; I'll lose my place I'm staying; if I go to [facility]." "I'm still depressed."

## 2016-01-17 NOTE — Progress Notes (Addendum)
[facility] [phone]. She provided authorization # for pt's referral to [facility]: [id number]. Called Dorisann Fr[facility]J [facility] to provide authorization number to accompany referral sent 01/16/16 per TTS- left voicemail for intake supervisor. Faxed referral form to [facility] [phone] with authorization number.  16:30Misty Stanley- Lisa, [facility] intake supervisor, called back stating intake team will review referral.

## 2016-01-17 NOTE — Consult Note (Signed)
[facility] Consult   Reason for Consult:  Consult for 40 year old woman well known to the emergency room who presented after an overdose also intoxicated Referring Physician:  Clearnce Hasten Patient Identification: Sandy Mason MRN:  [id number] Principal Diagnosis: Major depressive disorder, recurrent episode, moderate ([place]) Diagnosis:   Patient Active Problem List   Diagnosis Date Noted  . Suicidal behavior [F48.9] 01/16/2016  . Alcohol intoxication ([place]) [F10.129]   . Severe episode of recurrent major depressive disorder, without psychotic features ([place]) [F33.2]   . Malingering [Z76.5] 12/16/2015  . Major depressive disorder, recurrent episode, moderate ([place]) [F33.1] 12/12/2015  . Closed pelvic fracture ([place]) [S32.9XXA] 10/31/2015  . Tobacco abuse [Z72.0] 10/31/2015  . Tobacco use disorder [F17.200] 03/28/2015  . PTSD (post-traumatic stress disorder) [F43.10] 03/28/2015  . Stimulant use disorder (cocaine) [F15.90] 03/28/2015  . 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 Opioid use disorder, severe, dependence ([place]) [F11.20] 12/20/2014  . Alcohol use disorder severe. [F10.20] 12/18/2014  . Borderline personality disorder [F60.3] 12/18/2014    Total Time spent with patient: 20 minutes  Subjective:   Sandy Mason is a 40 y.o. female patient admitted with "I took a whole bunch of pills".  Follow-up for this 40 year old woman with a history of mood instability and substance abuse. Came back to the emergency room after abusing substances once again talking about being suicidal. Patient has been referred to the alcohol and drug abuse treatment center after discussion among several members of the mental health treatment team. Today she continues to talk about vague suicidal ideation. She stays very withdrawn into her self in her room. Not acting out or being aggressive no evidence of acute psychosis. Blood pressure  slightly elevated no physical complaints.  HPI:  Patient interviewed. Chart reviewed. Labs and vitals reviewed. Case discussed with ER doctor and TTS. 40 year old woman with history of substance abuse and personality disorder. Claims that she took an entire bottle of risperidone. Said she did it because she just "didn't want to be here". Says she still wants to die. Apparently a neighbor or friend saw her do it and called for help. Patient admits that she had been drinking on the day in question. She had a blood alcohol level over 300 on presentation. She denied she was using any other drugs but her drug screen is positive for opiates. Patient claims that she had gone to [facility] to begin treatment. Mood however she said remained depressed down and flat. Sleep poor. Frequent suicidal thoughts. Denies hallucinations.  Social history: Says she that she moved out of the problematic apartment and is now staying in a boarding house but that situation is unsatisfactory as well. Not working.  Medical history: History of multiple overdoses. Minimal ongoing active actual medical problems.  Substance abuse history: Long established history of alcohol and opiate abuse as well as intermittent abuse of other drugs.  Past Psychiatric History: Multiple presentations to the emergency room. Diagnosed variously as depressed bipolar PTSD and personality disorder. Multiple prior suicide attempts. Has been considered in the past to be minimally likely benefit from inpatient treatment because of her behavior problems and substance abuse. Resist most recommended outpatient treatment. Unclear whether medicines of been of any benefit.  Risk to Self: Suicidal Ideation: Yes-Currently Present Suicidal Intent: Yes-Currently Present Is patient at risk for suicide?: Yes Suicidal Plan?: Yes-Currently Present Specify Current Suicidal Plan: Pt reports she took an overdose of pills Access to Means: Yes Specify Access to Suicidal Means:  Pt has access to precription meds and illicit drugs What has been your use of drugs/alcohol within the last 12 months?: alcohol  How many times?: 2 Other Self Harm Risks: None identified Triggers for Past Attempts: Unknown Intentional Self Injurious Behavior: None Risk to Others: Homicidal Ideation: No Thoughts of Harm to Others: No Current Homicidal Intent: No Current Homicidal Plan: No Access to Homicidal Means: No Identified Victim: None identified History of harm to others?: No Assessment of Violence: None Noted Violent Behavior Description: None identified Does patient have access to weapons?: No Criminal Charges Pending?: No Does patient have a court date: No Prior Inpatient Therapy: Prior Inpatient Therapy: Yes Prior Therapy Dates: 11/2015 Prior Therapy Facilty/Provider(s): [facility] Reason for Treatment: PTSD Prior Outpatient Therapy: Prior Outpatient Therapy: Yes Prior Therapy Dates: current Prior Therapy Facilty/Provider(s): [facility] Reason for Treatment: ptsd Does patient have an ACCT team?: No Does patient have Intensive In-House Services?  : No Does patient have [facility] services? : No Does patient have P4CC services?: No  Past Medical History:  Past Medical History  Diagnosis Date  . CVA (cerebral infarction)   . Seizures ([place])   . Depression   . Anxiety   . Suicide attempt by hanging ([place])   . Alcohol use disorder severe. 12/18/2014  . 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 Borderline personality disorder 12/18/2014  . Opioid use disorder, severe, dependence ([place]) 12/20/2014  . Sedative, hypnotic or anxiolytic use disorder, severe, dependence ([place]) 03/27/2015  . PTSD (post-traumatic stress disorder) 03/28/2015   No past surgical history on file. Family History:  Family History  Problem Relation Age of Onset  . Diabetes Father    Family Psychiatric  History: Positive for substance abuse Social History:  History   Alcohol Use  . 3.6 oz/week  . 6 Cans of beer per week    Comment: tonight 6      History  Drug Use  . Yes  . Special: Benzodiazepines, Hydrocodone    Social History   Social History  . Marital Status: Single    Spouse Name: N/A  . Number of Children: N/A  . Years of Education: N/A   Social History Main Topics  . Smoking status: Current Every Day Smoker -- 1.00 packs/day    Types: Cigarettes  . Smokeless tobacco: Not on file  . Alcohol Use: 3.6 oz/week    6 Cans of beer per week     Comment: tonight 6   . Drug Use: Yes    Special: Benzodiazepines, Hydrocodone  . Sexual Activity: Yes    Birth Control/ Protection: IUD   Other Topics Concern  . Not on file   Social History Narrative   Additional Social History:    Allergies:   Allergies  Allergen Reactions  . Depakote [Valproic Acid] Other (See Comments)    Reaction:  Seizures   . Haldol [Haloperidol Lactate] Other (See Comments)    Reaction:  Seizures   . Keflet [Cephalexin] Rash    Labs:  Results for orders placed or performed during the hospital encounter of 01/16/16 (from the past 48 hour(s))  Comprehensive metabolic panel     Status: Abnormal   Collection Time: 01/16/16  2:28 AM  Result Value Ref Range   Sodium 143 135 - 145 mmol/L   Potassium 3.3 (L) 3.5 - 5.1 mmol/L   Chloride 110 101 - 111 mmol/L   CO2 22 22 - 32 mmol/L   Glucose, Bld 128 (H) 65 - 99  mg/dL   BUN 8 6 - 20 mg/dL   Creatinine, Ser 0.58 0.44 - 1.00 mg/dL   Calcium 8.8 (L) 8.9 - 10.3 mg/dL   Total Protein 7.8 6.5 - 8.1 g/dL   Albumin 4.1 3.5 - 5.0 g/dL   AST 31 15 - 41 U/L   ALT 36 14 - 54 U/L   Alkaline Phosphatase 83 38 - 126 U/L   Total Bilirubin <0.1 (L) 0.3 - 1.2 mg/dL   GFR calc non Af Amer >60 >60 mL/min   GFR calc Af Amer >60 >60 mL/min    Comment: (NOTE) The eGFR has been calculated using the CKD EPI equation. This calculation has not been validated in all clinical situations. eGFR's persistently <60 mL/min signify  possible Chronic Kidney Disease.    Anion gap 11 5 - 15  Ethanol     Status: Abnormal   Collection Time: 01/16/16  2:28 AM  Result Value Ref Range   Alcohol, Ethyl (B) 323 (HH) <5 mg/dL    Comment: CRITICAL RESULT CALLED TO, READ BACK BY AND VERIFIED WITH DAWN [facility] AT 5465 01/16/16.PMH        LOWEST DETECTABLE LIMIT FOR SERUM ALCOHOL IS 5 mg/dL FOR MEDICAL PURPOSES ONLY   Salicylate level     Status: None   Collection Time: 01/16/16  2:28 AM  Result Value Ref Range   Salicylate Lvl <0.3 2.8 - 30.0 mg/dL  Acetaminophen level     Status: None   Collection Time: 01/16/16  2:28 AM  Result Value Ref Range   Acetaminophen (Tylenol), Serum 10 10 - 30 ug/mL    Comment:        THERAPEUTIC CONCENTRATIONS VARY SIGNIFICANTLY. A RANGE OF 10-30 ug/mL MAY BE AN EFFECTIVE CONCENTRATION FOR MANY PATIENTS. HOWEVER, SOME ARE BEST TREATED AT CONCENTRATIONS OUTSIDE THIS RANGE. ACETAMINOPHEN CONCENTRATIONS >150 ug/mL AT 4 HOURS AFTER INGESTION AND >50 ug/mL AT 12 HOURS AFTER INGESTION ARE OFTEN ASSOCIATED WITH TOXIC REACTIONS.   cbc     Status: None   Collection Time: 01/16/16  2:28 AM  Result Value Ref Range   WBC 8.7 3.6 - 11.0 K/uL   RBC 4.25 3.80 - 5.20 MIL/uL   Hemoglobin 13.6 12.0 - 16.0 g/dL   HCT 39.3 35.0 - 47.0 %   MCV 92.4 80.0 - 100.0 fL   MCH 31.9 26.0 - 34.0 pg   MCHC 34.5 32.0 - 36.0 g/dL   RDW 12.6 11.5 - 14.5 %   Platelets 268 150 - 440 K/uL  Urine Drug Screen, Qualitative     Status: Abnormal   Collection Time: 01/16/16  2:28 AM  Result Value Ref Range   Tricyclic, Ur Screen POSITIVE (A) NONE DETECTED   Amphetamines, Ur Screen NONE DETECTED NONE DETECTED   MDMA (Ecstasy)Ur Screen NONE DETECTED NONE DETECTED   Cocaine Metabolite,Ur [place] NONE DETECTED NONE DETECTED   Opiate, Ur Screen POSITIVE (A) NONE DETECTED   Phencyclidine (PCP) Ur S NONE DETECTED NONE DETECTED   Cannabinoid 50 Ng, Ur [place] NONE DETECTED NONE DETECTED   Barbiturates, Ur Screen NONE DETECTED NONE  DETECTED   Benzodiazepine, Ur Scrn NONE DETECTED NONE DETECTED   Methadone Scn, Ur NONE DETECTED NONE DETECTED    Comment: (NOTE) 546  Tricyclics, urine               Cutoff 1000 ng/mL 200  Amphetamines, urine             Cutoff 1000 ng/mL 300  MDMA (Ecstasy), urine  Cutoff 500 ng/mL 400  Cocaine Metabolite, urine       Cutoff 300 ng/mL 500  Opiate, urine                   Cutoff 300 ng/mL 600  Phencyclidine (PCP), urine      Cutoff 25 ng/mL 700  Cannabinoid, urine              Cutoff 50 ng/mL 800  Barbiturates, urine             Cutoff 200 ng/mL 900  Benzodiazepine, urine           Cutoff 200 ng/mL 1000 Methadone, urine                Cutoff 300 ng/mL 1100 1200 The urine drug screen provides only a preliminary, unconfirmed 1300 analytical test result and should not be used for non-medical 1400 purposes. Clinical consideration and professional judgment should 1500 be applied to any positive drug screen result due to possible 1600 interfering substances. A more specific alternate chemical method 1700 must be used in order to obtain a confirmed analytical result.  1800 Gas chromato graphy / mass spectrometry (GC/MS) is the preferred 1900 confirmatory method.   Acetaminophen level     Status: Abnormal   Collection Time: 01/16/16  7:35 AM  Result Value Ref Range   Acetaminophen (Tylenol), Serum <10 (L) 10 - 30 ug/mL    Comment:        THERAPEUTIC CONCENTRATIONS VARY SIGNIFICANTLY. A RANGE OF 10-30 ug/mL MAY BE AN EFFECTIVE CONCENTRATION FOR MANY PATIENTS. HOWEVER, SOME ARE BEST TREATED AT CONCENTRATIONS OUTSIDE THIS RANGE. ACETAMINOPHEN CONCENTRATIONS >150 ug/mL AT 4 HOURS AFTER INGESTION AND >50 ug/mL AT 12 HOURS AFTER INGESTION ARE OFTEN ASSOCIATED WITH TOXIC REACTIONS.   Salicylate level     Status: None   Collection Time: 01/16/16  7:35 AM  Result Value Ref Range   Salicylate Lvl <2.4 2.8 - 30.0 mg/dL    Current Facility-Administered Medications  Medication  Dose Route Frequency Provider Last Rate Last Dose  . hydrOXYzine (ATARAX/VISTARIL) 25 MG tablet           . LORazepam (ATIVAN) tablet 0-4 mg  0-4 mg Oral Q6H Paulette Blanch, MD   0 mg at 01/16/16 0814  . mirtazapine (REMERON) tablet 15 mg  15 mg Oral QHS Ponciano Ort, MD   15 mg at 01/16/16 2030  . thiamine (VITAMIN B-1) tablet 100 mg  100 mg Oral Daily Paulette Blanch, MD   100 mg at 01/17/16 1026   Current Outpatient Prescriptions  Medication Sig Dispense Refill  . cyclobenzaprine (FLEXERIL) 5 MG tablet Take 1 tablet (5 mg total) by mouth 3 (three) times daily as needed for muscle spasms. 15 tablet 0  . gabapentin (NEURONTIN) 300 MG capsule Take 3 capsules (900 mg total) by mouth 3 (three) times daily. 120 capsule 0  . hydrOXYzine (VISTARIL) 25 MG capsule Take 1 capsule (25 mg total) by mouth 3 (three) times daily as needed. 30 capsule 0  . lidocaine (LIDODERM) 5 % Place 2 patches onto the skin daily. Remove & Discard patch within 12 hours or as directed by MD 60 patch 0  . meloxicam (MOBIC) 7.5 MG tablet Take 1 tablet (7.5 mg total) by mouth 2 (two) times daily with a meal. 60 tablet 0  . mirtazapine (REMERON) 15 MG tablet Take 1 tablet (15 mg total) by mouth at bedtime. 30 tablet 0  . prazosin (MINIPRESS) 2  MG capsule Take 1 capsule (2 mg total) by mouth at bedtime. 30 capsule 0  . venlafaxine XR (EFFEXOR-XR) 150 MG 24 hr capsule Take 2 capsules (300 mg total) by mouth daily with breakfast. 30 capsule 0  . nitrofurantoin, macrocrystal-monohydrate, (MACROBID) 100 MG capsule Take 1 capsule (100 mg total) by mouth every 12 (twelve) hours. (Patient not taking: Reported on 01/16/2016) 3 capsule 0    Musculoskeletal: Strength & Muscle Tone: decreased Gait & Station: unable to stand Patient leans: N/A  Psychiatric Specialty Exam: Physical Exam  Nursing note and vitals reviewed. Constitutional: She appears well-developed and well-nourished.  HENT:  Head: Normocephalic and atraumatic.  Eyes:  Conjunctivae are normal. Pupils are equal, round, and reactive to light.  Neck: Normal range of motion.  Cardiovascular: Normal heart sounds.   Respiratory: Effort normal. No respiratory distress.  GI: Soft.  Musculoskeletal: Normal range of motion.  Neurological: She is alert.  Skin: Skin is warm and dry.  Psychiatric: Her affect is blunt. Her speech is delayed. She is withdrawn. Cognition and memory are impaired. She expresses impulsivity. She exhibits a depressed mood. She expresses suicidal ideation.    Review of Systems  Constitutional: Negative.   HENT: Negative.   Eyes: Negative.   Respiratory: Negative.   Cardiovascular: Negative.   Gastrointestinal: Negative.   Musculoskeletal: Negative.   Skin: Negative.   Neurological: Negative.   Psychiatric/Behavioral: Positive for depression, suicidal ideas, memory loss and substance abuse. Negative for hallucinations. The patient has insomnia. The patient is not nervous/anxious.     Blood pressure 141/96, pulse 91, temperature 98.4 F (36.9 C), temperature source Oral, resp. rate 20, height '5\' 8"'  (1.727 m), weight 105.235 kg (232 lb), SpO2 90 %.Body mass index is 35.28 kg/(m^2).  General Appearance: Disheveled  Eye Contact:  Absent  Speech:  Garbled  Volume:  Decreased  Mood:  Depressed  Affect:  Blunt  Thought Process:  Goal Directed  Orientation:  Full (Time, Place, and Person)  Thought Content:  Rumination  Suicidal Thoughts:  Yes.  with intent/plan  Homicidal Thoughts:  No  Memory:  Immediate;   Fair Recent;   Poor Remote;   Fair  Judgement:  Impaired  Insight:  Shallow  Psychomotor Activity:  Decreased  Concentration:  Concentration: Fair  Recall:  [employer] of Knowledge:  Fair  Language:  Fair  Akathisia:  No  Handed:  Right  AIMS (if indicated):     Assets:  Physical Health  ADL's:  Intact  Cognition:  WNL  Sleep:        Treatment Plan Summary: Plan No change to current treatment plan. We are awaiting a  decision from the alcohol and drug abuse treatment center. If that is not available we will have to readdress the plan with patient as she is really not likely to benefit from inpatient psychiatric hospitalization especially given the intensity of her chronic substance abuse problems. Continue current medicine for now. Reviewed plan with patient. Case discussed with TTS.  Disposition: Supportive therapy provided about ongoing stressors. Discussed crisis plan, support from social network, calling 911, coming to the Emergency Department, and calling Suicide Hotline.  Alethia Berthold, MD 01/17/2016 3:55 PM

## 2016-01-17 NOTE — ED Notes (Signed)
Pt out to hallway reporting that she is unable to sleep. MD notified and verbal order received for 50mg  of PO benadryl.

## 2016-01-18 MED ORDER — ONDANSETRON 8 MG PO TBDP
4.0000 mg | ORAL_TABLET | Freq: Three times a day (TID) | ORAL | Status: DC | PRN
  Administered 2016-01-19: 4 mg via ORAL
  Filled 2016-01-18: qty 0.5

## 2016-01-18 MED ORDER — ONDANSETRON HCL 4 MG PO TABS
ORAL_TABLET | ORAL | Status: AC
  Filled 2016-01-18: qty 1

## 2016-01-18 MED ORDER — LORAZEPAM 1 MG PO TABS
1.0000 mg | ORAL_TABLET | Freq: Four times a day (QID) | ORAL | Status: DC | PRN
  Administered 2016-01-18 (×2): 1 mg via ORAL
  Filled 2016-01-18 (×3): qty 1

## 2016-01-18 MED ORDER — ONDANSETRON HCL 4 MG PO TABS
ORAL_TABLET | ORAL | Status: AC
  Administered 2016-01-18: 4 mg
  Filled 2016-01-18: qty 1

## 2016-01-18 NOTE — ED Notes (Signed)
Patient sitting in dayroom watching television with peer in no apparent distress. Patient is calm and cooperative and denies SI/HI. Will continue to monitor Q15 minutes and maintain observation by security camera for safety.

## 2016-01-18 NOTE — ED Notes (Signed)
Snacks given at this time

## 2016-01-18 NOTE — ED Notes (Signed)
Patient resting quietly in room. No noted distress or abnormal behaviors noted. Will continue 15 minute checks and observation by security camera for safety. 

## 2016-01-18 NOTE — ED Notes (Addendum)
Patient complained of nausea, sweatiness, and anxiety.  Per physician orders, patient was administered 1mg  of ativan and 4mg  of zofran. Additionally, patient states that she would like to go back to the boarding house tomorrow with her peer support advisor, stating that this would be the safest plan for her at this time. [facility] referral is still pending. No signs of distress noted at this time. Maintained on 15 minute checks and observation by security camera for safety.

## 2016-01-18 NOTE — ED Provider Notes (Signed)
Progress note  01/18/2016 7:53 AM  Patient was feeling like she was having withdrawal symptoms, and feeling extremely anxious and nauseous. Patient was written to have Ativan every 6 hours as needed as well as Zofran for her nausea when necessary.  Leona CarryLinda M Ivadell Gaul, MD 01/18/16 [phone]

## 2016-01-18 NOTE — ED Notes (Signed)
Patient asleep in room. No noted distress or abnormal behavior. Will continue 15 minute checks and observation by security cameras for safety. 

## 2016-01-18 NOTE — ED Provider Notes (Signed)
-----------------------------------------   7:12 AM on 01/18/2016 -----------------------------------------   Blood pressure 120/82, pulse 84, temperature 98.4 F (36.9 C), temperature source Oral, resp. rate 17, height 5\' 8"  (1.727 m), weight 232 lb (105.235 kg), SpO2 97 %.  The patient had no acute events since last update.  Calm and cooperative at this time.  Disposition is pending per Psychiatry/Behavioral Medicine team recommendations.     Gayla DossEryka A Saralee Bolick, MD 01/18/16 [phone]

## 2016-01-18 NOTE — ED Notes (Signed)
Patient asleep in room. No noted distress or abnormal behavior. Will continue 15 minute checks and observation by security cameras for safety.-p

## 2016-01-18 NOTE — ED Notes (Signed)
Patient took a shower and is now socializing in the dayroom. No signs of distress noted at this time. Maintained on 15 minute checks and observation by security camera for safety.

## 2016-01-18 NOTE — ED Notes (Signed)
Patient complaining of anxiety. Lorazepam 1mg  administered per order. Will continue to monitor.

## 2016-01-18 NOTE — ED Notes (Signed)
Patient currently denies SI/HI/AVH and pain. Patient states that she would like to return home to her boarding house because she has only paid up until the end of the month and will not be able [facility] Patient says that she wants to attend her peer support groups and use her outpatient resources as they were intended instead of drinking. Patient at this time is calm and cooperative. No signs of acute distress at this time. Maintained on 15 minute checks and observation by security camera for safety.

## 2016-01-18 NOTE — Progress Notes (Signed)
[facility]athy at [facility] called to state that referral is being reviewed. Asked for authorization number [id number]), which Clinical research associatewriter provided. Sandy MessierKathy states [facility] will call back when more information is needed.

## 2016-01-18 NOTE — ED Notes (Signed)
ENVIRONMENTAL ASSESSMENT Potentially harmful objects out of patient reach: Yes Personal belongings secured: Yes Patient dressed in hospital provided attire only: Yes Plastic bags out of patient reach: Yes Patient care equipment (cords, cables, call bells, lines, and drains) shortened, removed, or accounted for: Yes Equipment and supplies removed from bottom of stretcher: Yes Potentially toxic materials out of patient reach: Yes Sharps container removed or out of patient reach: Yes  Patient currently in room sleeping. No signs of distress noted at this time. Maintained on 15 minute checks and observation by security camera for safety.  
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
## 2016-01-19 MED ORDER — ONDANSETRON 4 MG PO TBDP
ORAL_TABLET | ORAL | Status: AC
  Filled 2016-01-19: qty 1

## 2016-01-19 MED ORDER — ONDANSETRON HCL 4 MG PO TABS
ORAL_TABLET | ORAL | Status: AC
  Filled 2016-01-19: qty 1

## 2016-01-19 MED ORDER — BISMUTH SUBSALICYLATE 262 MG/15ML PO SUSP
30.0000 mL | Freq: Once | ORAL | Status: AC
  Administered 2016-01-19: 30 mL via ORAL
  Filled 2016-01-19: qty 118

## 2016-01-19 NOTE — ED Notes (Signed)
Patient complaining of nausea, no vomiting. PRN given per MD order.

## 2016-01-19 NOTE — ED Notes (Signed)
Patient requesting to be discharged. She was reminded that she is under commitment papers and needs to have these rescinded by MD. She is still experiencing s/s opiate withdrawal. Will continue to monitor and medicate with PRNs per MD order. Maintained on 15 minute checks and observation by security camera for safety.

## 2016-01-19 NOTE — ED Notes (Signed)
Patient asleep in room. No noted distress or abnormal behavior. Will continue 15 minute checks and observation by security cameras for safety. 

## 2016-01-19 NOTE — ED Notes (Signed)
Patient sitting in dayroom in no apparent distress. Still requesting to be discharged.

## 2016-01-19 NOTE — ED Provider Notes (Addendum)
-----------------------------------------   6:51 PM on 01/19/2016 -----------------------------------------  Patient cleared by psychiatry for discharge, has no ongoing complaints vital signs stable, medically cleared prior to my arrival with discharge. Psychiatry feels no indication for IVC patient has no SI or HI.  Jeanmarie PlantJames A McShane, MD 01/19/16 1851  Jeanmarie PlantJames A McShane, MD 01/19/16 [phone]

## 2016-01-19 NOTE — Discharge Instructions (Signed)

## 2016-01-19 NOTE — ED Notes (Signed)
Patient resting quietly in room. No noted distress or abnormal behaviors noted. Will continue 15 minute checks and observation by security camera for safety. 

## 2016-01-19 NOTE — ED Notes (Signed)
Patient discharged ambulatory to self. She denies SI or HI. Discharge instructions reviewed with staff, she verbalizes understanding. Patient received copy of DC plan and all personal belongings.

## 2016-01-19 NOTE — ED Notes (Signed)
Supper tray given to patient.

## 2016-01-19 NOTE — ED Provider Notes (Signed)
-----------------------------------------   7:25 AM on 01/19/2016 -----------------------------------------   Blood pressure 129/88, pulse 91, temperature 98.3 F (36.8 C), temperature source Oral, resp. rate 17, height 5\' 8"  (1.727 m), weight 232 lb (105.235 kg), SpO2 98 %.  The patient had no acute events since last update.  Calm and cooperative at this time.  Disposition is pending per Psychiatry/Behavioral Medicine team recommendations.     Jennye MoccasinBrian S Taiki Buckwalter, MD 01/19/16 [phone]

## 2016-01-19 NOTE — ED Notes (Signed)

## 2016-01-19 NOTE — Consult Note (Signed)
[facility]   Reason for Consult:  Follow-up for this 40 year old woman with a history of borderline personality disorder PTSD multiple drug abuse suicidal ideation. Referring Physician:  Alphonzo LemmingsMcShane Patient Identification: Sandy Mason MRN:  [id number] Principal Diagnosis: Major depressive disorder, recurrent episode, moderate (HCC) Diagnosis:   Patient Active Problem List   Diagnosis Date Noted  . Suicidal behavior [F48.9] 01/16/2016  . Alcohol intoxication (HCC) [F10.129]   . Severe episode of recurrent major depressive disorder, without psychotic features (HCC) [F33.2]   . Malingering [Z76.5] 12/16/2015  . Major depressive disorder, recurrent episode, moderate (HCC) [F33.1] 12/12/2015  . Closed pelvic fracture (HCC) [S32.9XXA] 10/31/2015  . Tobacco abuse [Z72.0] 10/31/2015  . Tobacco use disorder [F17.200] 03/28/2015  . PTSD (post-traumatic stress disorder) [F43.10] 03/28/2015  . Stimulant use disorder (cocaine) [F15.90] 03/28/2015  . Sedative, hypnotic or anxiolytic use disorder, severe, dependence (HCC) [F13.20] 03/27/2015  . Opioid use disorder, severe, dependence (HCC) [F11.20] 12/20/2014  . Alcohol use disorder severe. [F10.20] 12/18/2014  . Borderline personality disorder [F60.3] 12/18/2014    Total Time spent with patient: 30 minutes  Subjective:   Sandy Mason is a 40 y.o. female patient admitted with "I'm finally feeling optimistic now".  HPI:  This a follow-up note for this patient who has been in the emergency room for several days because of ongoing suicidal ideation. As documented previously the psychiatric services team feels that the patient would not benefit from treatment on our unit. She had continued to voice suicidal ideation. There had been a tentative plan to try to send her to the alcohol and drug abuse treatment facility but we have been waiting days for any kind of word from them. Today the patient says that her mood is finally  feeling better. She has established a plan to have a new place to live. She is feeling committed to staying away from alcohol and drugs. She plans to follow-up with [facility]. [facility] and [facility]. Patient is requesting discharge.  Past Psychiatric History: As documented previously she has a long history of behavior disorder a lot of which is the result of substance abuse problems but a significant amount of which is also the result of PTSD personality disorder chronic depression. Multiple hospitalizations. History of suicide attempts. Frequent noncompliance with outpatient treatment.  Risk to Self: Suicidal Ideation: Yes-Currently Present Suicidal Intent: Yes-Currently Present Is patient at risk for suicide?: Yes Suicidal Plan?: Yes-Currently Present Specify Current Suicidal Plan: Pt reports she took an overdose of pills Access to Means: Yes Specify Access to Suicidal Means: Pt has access to precription meds and illicit drugs What has been your use of drugs/alcohol within the last 12 months?: alcohol  How many times?: 2 Other Self Harm Risks: None identified Triggers for Past Attempts: Unknown Intentional Self Injurious Behavior: None Risk to Others: Homicidal Ideation: No Thoughts of Harm to Others: No Current Homicidal Intent: No Current Homicidal Plan: No Access to Homicidal Means: No Identified Victim: None identified History of harm to others?: No Assessment of Violence: None Noted Violent Behavior Description: None identified Does patient have access to weapons?: No Criminal Charges Pending?: No Does patient have a court date: No Prior Inpatient Therapy: Prior Inpatient Therapy: Yes Prior Therapy Dates: 11/2015 Prior [facility] Reason for Treatment: PTSD Prior Outpatient Therapy: Prior Outpatient Therapy: Yes Prior Therapy Dates: current Prior Therapy Facilty/Provider(s): [facility] Reason for Treatment:  ptsd Does patient have an ACCT team?: No  Does patient have Intensive In-House Services?  : No Does patient have [facility] services? : No Does patient have P4CC services?: No  Past Medical History:  Past Medical History  Diagnosis Date  . CVA (cerebral infarction)   . Seizures (HCC)   . Depression   . Anxiety   . Suicide attempt by hanging (HCC)   . Alcohol use disorder severe. 12/18/2014  . 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 Borderline personality disorder 12/18/2014  . Opioid use disorder, severe, dependence (HCC) 12/20/2014  . 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 PTSD (post-traumatic stress disorder) 03/28/2015   No past surgical history on file. Family History:  Family History  Problem Relation Age of Onset  . Diabetes Father    Family Psychiatric  History: Family history positive for substance abuse Social History:  History  Alcohol Use  . 3.6 oz/week  . 6 Cans of beer per week    Comment: tonight 6      History  Drug Use  . Yes  . Special: Benzodiazepines, Hydrocodone    Social History   Social History  . Marital Status: Single    Spouse Name: N/A  . Number of Children: N/A  . Years of Education: N/A   Social History Main Topics  . Smoking status: Current Every Day Smoker -- 1.00 packs/day    Types: Cigarettes  . Smokeless tobacco: Not on file  . Alcohol Use: 3.6 oz/week    6 Cans of beer per week     Comment: tonight 6   . Drug Use: Yes    Special: Benzodiazepines, Hydrocodone  . Sexual Activity: Yes    Birth Control/ Protection: IUD   Other Topics Concern  . Not on file   Social History Narrative   Additional Social History:    Allergies:   Allergies  Allergen Reactions  . Depakote [Valproic Acid] Other (See Comments)    Reaction:  Seizures   . Haldol [Haloperidol Lactate] Other (See Comments)    Reaction:  Seizures   . Keflet [Cephalexin] Rash     Labs: No results found for this or any previous visit (from the past 48 hour(s)).  Current Facility-Administered Medications  Medication Dose Route Frequency Provider Last Rate Last Dose  . LORazepam (ATIVAN) tablet 1 mg  1 mg Oral Q6H PRN Leona CarryLinda M Taylor, MD   1 mg at 01/18/16 2213  . mirtazapine (REMERON) tablet 15 mg  15 mg Oral QHS Maurilio LovelyNoelle McLaurin, MD   15 mg at 01/18/16 2135  . ondansetron (ZOFRAN) 4 MG tablet           . ondansetron (ZOFRAN-ODT) disintegrating tablet 4 mg  4 mg Oral Q8H PRN Leona CarryLinda M Taylor, MD   4 mg at 01/19/16 1309  . prazosin (MINIPRESS) capsule 2 mg  2 mg Oral QHS Audery AmelJohn T Britten Seyfried, MD   2 mg at 01/18/16 2135  . thiamine (VITAMIN B-1) tablet 100 mg  100 mg Oral Daily Irean HongJade J Sung, MD   100 mg at 01/18/16 1034  . venlafaxine XR (EFFEXOR-XR) 24 hr capsule 150 mg  150 mg Oral Q breakfast Audery AmelJohn T Lan Entsminger, MD   150 mg at 01/18/16 1034   Current Outpatient Prescriptions  Medication Sig Dispense Refill  . cyclobenzaprine (FLEXERIL) 5 MG tablet Take 1 tablet (5 mg total) by mouth 3 (three) times daily as needed for muscle spasms. 15 tablet 0  . gabapentin (NEURONTIN) 300 MG capsule  Take 3 capsules (900 mg total) by mouth 3 (three) times daily. 120 capsule 0  . hydrOXYzine (VISTARIL) 25 MG capsule Take 1 capsule (25 mg total) by mouth 3 (three) times daily as needed. 30 capsule 0  . lidocaine (LIDODERM) 5 % Place 2 patches onto the skin daily. Remove & Discard patch within 12 hours or as directed by MD 60 patch 0  . meloxicam (MOBIC) 7.5 MG tablet Take 1 tablet (7.5 mg total) by mouth 2 (two) times daily with a meal. 60 tablet 0  . mirtazapine (REMERON) 15 MG tablet Take 1 tablet (15 mg total) by mouth at bedtime. 30 tablet 0  . prazosin (MINIPRESS) 2 MG capsule Take 1 capsule (2 mg total) by mouth at bedtime. 30 capsule 0  . venlafaxine XR (EFFEXOR-XR) 150 MG 24 hr capsule Take 2 capsules (300 mg total) by mouth daily with breakfast. 30 capsule 0  . nitrofurantoin,  macrocrystal-monohydrate, (MACROBID) 100 MG capsule Take 1 capsule (100 mg total) by mouth every 12 (twelve) hours. (Patient not taking: Reported on 01/16/2016) 3 capsule 0    Musculoskeletal: Strength & Muscle Tone: within normal limits Gait & Station: normal Patient leans: N/A  Psychiatric Specialty Exam: Physical Exam  Nursing note and vitals reviewed. Constitutional: She appears well-developed and well-nourished.  HENT:  Head: Normocephalic and atraumatic.  Eyes: Conjunctivae are normal. Pupils are equal, round, and reactive to light.  Neck: Normal range of motion.  Cardiovascular: Regular rhythm and normal heart sounds.   Respiratory: Effort normal. No respiratory distress.  GI: Soft.  Musculoskeletal: Normal range of motion.  Neurological: She is alert.  Skin: Skin is warm and dry.  Psychiatric: She has a normal mood and affect. Her behavior is normal. Judgment and thought content normal.    Review of Systems  Constitutional: Negative.   HENT: Negative.   Eyes: Negative.   Respiratory: Negative.   Cardiovascular: Negative.   Gastrointestinal: Negative.   Musculoskeletal: Negative.   Skin: Negative.   Neurological: Negative.   Psychiatric/Behavioral: Negative for depression, suicidal ideas, hallucinations, memory loss and substance abuse. The patient is not nervous/anxious and does not have insomnia.     Blood pressure 125/79, pulse 90, temperature 98.3 F (36.8 C), temperature source Oral, resp. rate 16, height 5\' 8"  (1.727 m), weight 105.235 kg (232 lb), SpO2 98 %.Body mass index is 35.28 kg/(m^2).  General Appearance: Casual  Eye Contact:  Good  Speech:  Clear and Coherent  Volume:  Normal  Mood:  Euthymic  Affect:  Appropriate  Thought Process:  Goal Directed  Orientation:  Full (Time, Place, and Person)  Thought Content:  Logical  Suicidal Thoughts:  No  Homicidal Thoughts:  No  Memory:  Immediate;   Good Recent;   Good Remote;   Good  Judgement:  Fair   Insight:  Fair  Psychomotor Activity:  Normal  Concentration:  Concentration: Fair  Recall:  [employer] of Knowledge:  Fair  Language:  Fair  Akathisia:  No  Handed:  Right  AIMS (if indicated):     Assets:  Communication Skills Desire for Improvement Physical Health Resilience  ADL's:  Intact  Cognition:  WNL  Sleep:        Treatment Plan Summary: Plan Patient today is presenting herself as much improved. I don't think this is a put on. I think she genuinely is feeling better. She seems relaxed and lucid. Patient is perfectly intelligent and I believe she sincerely does want to get better she  just has a very hard time following through and staying sober. It is clear to me at this point that the alcohol and drug abuse treatment center is not likely to come through with a treatment plan for her and it is clearly not specifically beneficial for her to stay here in the emergency room. Patient will be discharge. IVC discontinued. Supportive therapy and review of her plan. She will follow-up with [facility].  Disposition: Supportive therapy provided about ongoing stressors. Discussed crisis plan, support from social network, calling 911, coming to the Emergency Department, and calling Suicide Hotline.  Mordecai Rasmussen, MD 01/19/2016 5:30 PM

## 2016-01-19 NOTE — ED Notes (Signed)
Patient resting in bed. She was asked several times to take medications but did not respond to request. RN will retry later. Maintained on 15 minute checks.

## 2016-01-19 NOTE — ED Notes (Signed)
Patient still resting in bed. Meal given.

## 2016-01-19 NOTE — ED Notes (Signed)
Patient is waiting for reassessment by MD.  Maintained on 15 minute checks and observation by security camera for safety.

## 2016-01-19 NOTE — ED Notes (Signed)
Per MD recommendation, patient is to be discharged. She is currently making arrangements for ride home.  Maintained on 15 minute checks and observation by security camera for safety.

## 2016-02-16 ENCOUNTER — Encounter: Payer: Self-pay | Admitting: Emergency Medicine

## 2016-02-16 ENCOUNTER — Emergency Department
Admission: EM | Admit: 2016-02-16 | Discharge: 2016-02-16 | Disposition: A | Discharge status: Home / Self Care | Payer: Self-pay | Attending: Emergency Medicine | Admitting: Emergency Medicine

## 2016-02-16 DIAGNOSIS — F603 Borderline personality disorder: Secondary | ICD-10-CM | POA: Diagnosis present

## 2016-02-16 DIAGNOSIS — F4310 Post-traumatic stress disorder, unspecified: Secondary | ICD-10-CM

## 2016-02-16 DIAGNOSIS — Z760 Encounter for issue of repeat prescription: Secondary | ICD-10-CM | POA: Insufficient documentation

## 2016-02-16 DIAGNOSIS — F332 Major depressive disorder, recurrent severe without psychotic features: Secondary | ICD-10-CM | POA: Diagnosis present

## 2016-02-16 DIAGNOSIS — F329 Major depressive disorder, single episode, unspecified: Secondary | ICD-10-CM | POA: Insufficient documentation

## 2016-02-16 DIAGNOSIS — F17210 Nicotine dependence, cigarettes, uncomplicated: Secondary | ICD-10-CM | POA: Insufficient documentation

## 2016-02-16 MED ORDER — GABAPENTIN 300 MG PO CAPS: 900.0000 mg | ORAL_CAPSULE | Freq: Three times a day (TID) | ORAL | Status: DC

## 2016-02-16 MED ORDER — VENLAFAXINE HCL ER 150 MG PO CP24: 300.0000 mg | ORAL_CAPSULE | Freq: Every day | ORAL | Status: DC

## 2016-02-16 MED ORDER — PRAZOSIN HCL 2 MG PO CAPS: 2.0000 mg | ORAL_CAPSULE | Freq: Every day | ORAL | Status: DC

## 2016-02-16 MED ORDER — HYDROXYZINE PAMOATE 25 MG PO CAPS: 25.0000 mg | ORAL_CAPSULE | Freq: Three times a day (TID) | ORAL | Status: DC | PRN

## 2016-02-16 MED ORDER — MIRTAZAPINE 15 MG PO TABS: 15.0000 mg | ORAL_TABLET | Freq: Every day | ORAL | Status: DC

## 2016-02-16 MED ORDER — MELOXICAM 7.5 MG PO TABS: 7.5000 mg | ORAL_TABLET | Freq: Two times a day (BID) | ORAL | Status: DC

## 2016-02-16 NOTE — BH Assessment (Signed)
Received phone call from ER Provider Gavin Pound(Rhonda Summers PA-C) about the patient. Patient brought to the ER requesting refills on her psychotropic medications. The earliest appointment she can be seen by her Psychiatrist, with [facility] is September. Writer spoke with her [facility] Microbiologisteer Support Worker [phone]). He stated, the patient hasn[facility] had [facility] and haven't been with them in over a year. Her initial appointment was 02/03/2016. However, patient missed the appointment. Peer Support attempted to get her an earlier date but 04/10/2016 was the earliest. [facility], they couldn't prescribed her medications without seeing her. In order to get the refills, she will have to go to the provider that prescribed them.  Writer spoke with ER Psych MD (Dr. Toni Amendlapacs) about the situation. He agreed to write the prescription, "If that's all she needs." Writer called ER Provider back and updated her. Writer contacted the Peer Support and informed him, she will be seen by the Psychiatrist and it will be determined at that point whether or not she will be giving a prescription for the medications.

## 2016-02-16 NOTE — ED Notes (Signed)
Pt states she missed her follow up appt with [facility] and needs meds refilled. Needs effexor, gabapentin, remeron.

## 2016-02-16 NOTE — ED Notes (Signed)
Dr. Clapacs in with pt. 

## 2016-02-16 NOTE — Discharge Instructions (Signed)
Medicine Refill at the Emergency Department We have refilled your medicine today, but it is best for you to get refills through your primary health care provider's office. In the future, please plan ahead so you do not need to get refills from the emergency department. If the medicine we refilled was a maintenance medicine, you may have received only enough to get you by until you are able to see your regular health care provider.   This information is not intended to replace advice given to you by your health care provider. Make sure you discuss any questions you have with your health care provider.   Document Released: 11/02/2003 Document Revised: 08/06/2014 Document Reviewed: 10/23/2013 Elsevier Interactive Patient Education 2016 [employer]   Keep your appointment with [facility].  Continue your medications as prescribed.

## 2016-02-16 NOTE — Consult Note (Signed)
[facility] Face-to[facility]Face [facility]iat[facility] Consult   Reason for Consult:  Consult for this 40 year old woman who came to the emergency room requesting a refill on prescription medicine Referring Physician:  Paduchowski Patient Identification: Sandy LieuJennifer J Mason MRN:  [id number] Principal Diagnosis: PTSD (post-traumatic stress disorder) Diagnosis:   Patient Active Problem List   Diagnosis Date Noted  . Suicidal behavior [F48.9] 01/16/2016  . Alcohol intoxication (HCC) [F10.129]   . Severe episode of recurrent major depressive disorder, without psychotic features (HCC) [F33.2]   . Malingering [Z76.5] 12/16/2015  . Major depressive disorder, recurrent episode, moderate (HCC) [F33.1] 12/12/2015  . Closed pelvic fracture (HCC) [S32.9XXA] 10/31/2015  . Tobacco abuse [Z72.0] 10/31/2015  . Tobacco use disorder [F17.200] 03/28/2015  . PTSD (post-traumatic stress disorder) [F43.10] 03/28/2015  . Stimulant use disorder (cocaine) [F15.90] 03/28/2015  . Sedative, hypnotic or anxiolytic use disorder, severe, dependence (HCC) [F13.20] 03/27/2015  . Opioid use disorder, severe, dependence (HCC) [F11.20] 12/20/2014  . Alcohol use disorder severe. [F10.20] 12/18/2014  . Borderline personality disorder [F60.3] 12/18/2014    Total Time spent with patient: 45 minutes  Subjective:   Sandy LieuJennifer J Mason is a 40 y.o. female patient admitted with "things are going pretty well".  HPI:  Patient seen. Chart reviewed. Case reviewed with TTS and emergency room staff. 40 year old woman who has a history of chronic mental health problems including recurrent depression posttraumatic stress disorder borderline personality disorder and substance abuse. She came into the emergency room today stating that she simply needed a refill on her medicine. She was supposed to go to [facility] to see Dr. Albin FischerMoffatt but missed her appointment this month. Her next appointment is not until early September. She is running out of her medicine. Patient says her  mood has been good. Denies any depression. Denies suicidal or homicidal thoughts. She is sleeping adequately. Has not had any psychotic symptoms. Has been compliant with her medicine. Still has chronic back and pelvis pain. She says she is trying to stay off of drugs although she still drinks a little bit. [facility] and intends to continue following up there.  Substance abuse history: Long history of alcohol and drug abuse. Currently seems to be mostly staying clean.  Social history: She is living with some friends right now. Patient has had trouble over time maintaining a stable living environment. She is hoping to get disability and Medicaid restarted soon.  Medical history: Chronic pelvic pain now from a history of a fracture. Otherwise fairly stable medically  Past Psychiatric History: Recurrent presentations with depression and agitation psychotic symptoms intoxication. History of suicide attempts and aggression. History of noncompliance at times. Currently seems to be doing better staying on her medicine.  Risk to Self: Is patient at risk for suicide?: No Risk to Others:   Prior Inpatient Therapy:   Prior Outpatient Therapy:    Past Medical History:  Past Medical History  Diagnosis Date  . CVA (cerebral infarction)   . Seizures (HCC)   . Depression   . Anxiety   . Suicide attempt by hanging (HCC)   . Alcohol use disorder severe. 12/18/2014  . 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 Borderline personality disorder 12/18/2014  . Opioid use disorder, severe, dependence (HCC) 12/20/2014  . 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 PTSD (post-traumatic stress disorder) 03/28/2015   History reviewed. No pertinent past surgical history. Family History:  Family History  Problem Relation Age of Onset  .  Diabetes Father    Family Psychiatric  History: Family history is positive for  mood disorder and substance abuse Social History:  History  Alcohol Use  . 3.6 oz/week  . 6 Cans of beer per week    Comment: tonight 6      History  Drug Use  . Yes  . Special: Benzodiazepines, Hydrocodone    Social History   Social History  . Marital Status: Single    Spouse Name: N/A  . Number of Children: N/A  . Years of Education: N/A   Social History Main Topics  . Smoking status: Current Every Day Smoker -- 1.00 packs/day    Types: Cigarettes  . Smokeless tobacco: None  . Alcohol Use: 3.6 oz/week    6 Cans of beer per week     Comment: tonight 6   . Drug Use: Yes    Special: Benzodiazepines, Hydrocodone  . Sexual Activity: Yes    Birth Control/ Protection: IUD   Other Topics Concern  . None   Social History Narrative   Additional Social History:    Allergies:   Allergies  Allergen Reactions  . Depakote [Valproic Acid] Other (See Comments)    Reaction:  Seizures   . Haldol [Haloperidol Lactate] Other (See Comments)    Reaction:  Seizures   . Keflet [Cephalexin] Rash    Labs: No results found for this or any previous visit (from the past 48 hour(s)).  No current facility-administered medications for this encounter.   Current Outpatient Prescriptions  Medication Sig Dispense Refill  . cyclobenzaprine (FLEXERIL) 5 MG tablet Take 1 tablet (5 mg total) by mouth 3 (three) times daily as needed for muscle spasms. 15 tablet 0  . gabapentin (NEURONTIN) 300 MG capsule Take 3 capsules (900 mg total) by mouth 3 (three) times daily. 270 capsule 1  . hydrOXYzine (VISTARIL) 25 MG capsule Take 1 capsule (25 mg total) by mouth every 8 (eight) hours as needed for anxiety. 60 capsule 1  . lidocaine (LIDODERM) 5 % Place 2 patches onto the skin daily. Remove & Discard patch within 12 hours or as directed by MD 60 patch 0  . meloxicam (MOBIC) 7.5 MG tablet Take 1 tablet (7.5 mg total) by mouth 2 (two) times daily with a meal. 60 tablet 1  . mirtazapine (REMERON) 15 MG  tablet Take 1 tablet (15 mg total) by mouth at bedtime. 30 tablet 1  . nitrofurantoin, macrocrystal-monohydrate, (MACROBID) 100 MG capsule Take 1 capsule (100 mg total) by mouth every 12 (twelve) hours. (Patient not taking: Reported on 01/16/2016) 3 capsule 0  . prazosin (MINIPRESS) 2 MG capsule Take 1 capsule (2 mg total) by mouth at bedtime. 30 capsule 1  . venlafaxine XR (EFFEXOR-XR) 150 MG 24 hr capsule Take 2 capsules (300 mg total) by mouth daily with breakfast. 60 capsule 1    Musculoskeletal: Strength & Muscle Tone: within normal limits Gait & Station: normal Patient leans: N/A  Psychiatric Specialty Exam: Physical Exam  Nursing note and vitals reviewed. Constitutional: She appears well-developed and well-nourished.  HENT:  Head: Normocephalic and atraumatic.  Eyes: Conjunctivae are normal. Pupils are equal, round, and reactive to light.  Neck: Normal range of motion.  Cardiovascular: Regular rhythm and normal heart sounds.   Respiratory: Effort normal. No respiratory distress.  GI: Soft.  Musculoskeletal: Normal range of motion.  Neurological: She is alert.  Skin: Skin is warm and dry.  Psychiatric: She has a normal mood and affect. Her behavior is normal.  Judgment and thought content normal.    Review of Systems  Constitutional: Negative.   HENT: Negative.   Eyes: Negative.   Respiratory: Negative.   Cardiovascular: Negative.   Gastrointestinal: Negative.   Musculoskeletal: Negative.   Skin: Negative.   Neurological: Negative.   Psychiatric/Behavioral: Negative for depression, suicidal ideas, hallucinations, memory loss and substance abuse. The patient is not nervous/anxious and does not have insomnia.     Blood pressure 132/86, pulse 85, temperature 98.2 F (36.8 C), temperature source Oral, resp. rate 18, height  (1.727 m), weight 106.595 kg (235 lb), SpO2 99 %.Body mass index is 35.74 kg/(m^2).  General Appearance: Fairly Groomed  Eye Contact:  Good   Speech:  Clear and Coherent  Volume:  Normal  Mood:  Euthymic  Affect:  Congruent  Thought Process:  Goal Directed  Orientation:  Full (Time, Place, and Person)  Thought Content:  Logical  Suicidal Thoughts:  No  Homicidal Thoughts:  No  Memory:  Immediate;   Good Recent;   Good Remote;   Good  Judgement:  Good  Insight:  Fair  Psychomotor Activity:  Normal  Concentration:  Concentration: Good  Recall:  Good  Fund of Knowledge:  Good  Language:  Good  Akathisia:  No  Handed:  Right  AIMS (if indicated):     Assets:  Communication Skills Desire for Improvement Housing Physical Health Resilience  ADL's:  Intact  Cognition:  WNL  Sleep:        Treatment Plan Summary: Medication management and Plan I have agreed to rewrite her prescriptions for noncontrolled substances. Prescriptions done for venlafaxine, prazosin, Remeron, gabapentin and Loxitane a.m. and Vistaril. Review medication use. Supportive counseling. I put a refill on it so that she will not need to come back if she doesn't get in before September. Patient is agreeable and will continue to follow-up as previously at [facility] with outpatient treatment. Case reviewed with emergency room staff.  Disposition: Medication management and Plan See note above  Mordecai Rasmussen, MD 02/16/2016 12:54 PM

## 2016-02-16 NOTE — ED Notes (Signed)
seetriage note  States she needs her gabapentin 900 mg tid,remeron 15 mg at hs, and effexor XR 150 mg needs refilling  [facility]  And is scheduled in sept . Was told to come her for med refill

## 2016-02-16 NOTE — ED Provider Notes (Signed)
[facility] Emergency Department Provider Note  ____________________________________________  Time seen: Approximately 10:26 AM  I have reviewed the triage vital signs and the nursing notes.   HISTORY  Chief Complaint Medication Refill   HPI Sandy Mason is a 40 y.o. female is here for refills of her medication. Patient states she missed her appointment with [facility]  and is scheduled for September 12.Currently patient is out of her gabapentin 900 mg 3 times a day, Remeron 15 mg at at bedtime, Effexor XR 150 mg. Patient states she moved to the area in April and has not obtained a PCP. She states that she was told to come to the emergency room by [facility] to get her medication refilled. Patient denies any other health problems. She states that Dr. Toni Amendlapacs originally prescribed her medication.   Past Medical History  Diagnosis Date  . CVA (cerebral infarction)   . Seizures (HCC)   . Depression   . Anxiety   . Suicide attempt by hanging (HCC)   . Alcohol use disorder severe. 12/18/2014  . 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 Borderline personality disorder 12/18/2014  . Opioid use disorder, severe, dependence (HCC) 12/20/2014  . 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 PTSD (post-traumatic stress disorder) 03/28/2015    Patient Active Problem List   Diagnosis Date Noted  . Suicidal behavior 01/16/2016  . Alcohol intoxication (HCC)   . Severe episode of recurrent major depressive disorder, without psychotic features (HCC)   . Malingering 12/16/2015  . Major depressive disorder, recurrent episode, moderate (HCC) 12/12/2015  . Closed pelvic fracture (HCC) 10/31/2015  . Tobacco abuse 10/31/2015  . Tobacco use disorder 03/28/2015  . PTSD (post-traumatic stress disorder) 03/28/2015  . Stimulant use disorder (cocaine) 03/28/2015  . Sedative, hypnotic or anxiolytic use  disorder, severe, dependence (HCC) 03/27/2015  . Opioid use disorder, severe, dependence (HCC) 12/20/2014  . Alcohol use disorder severe. 12/18/2014  . Borderline personality disorder 12/18/2014    History reviewed. No pertinent past surgical history.  Current Outpatient Rx  Name  Route  Sig  Dispense  Refill  . cyclobenzaprine (FLEXERIL) 5 MG tablet   Oral   Take 1 tablet (5 mg total) by mouth 3 (three) times daily as needed for muscle spasms.   15 tablet   0   . gabapentin (NEURONTIN) 300 MG capsule   Oral   Take 3 capsules (900 mg total) by mouth 3 (three) times daily.   270 capsule   1   . hydrOXYzine (VISTARIL) 25 MG capsule   Oral   Take 1 capsule (25 mg total) by mouth every 8 (eight) hours as needed for anxiety.   60 capsule   1   . lidocaine (LIDODERM) 5 %   Transdermal   Place 2 patches onto the skin daily. Remove & Discard patch within 12 hours or as directed by MD   60 patch   0   . meloxicam (MOBIC) 7.5 MG tablet   Oral   Take 1 tablet (7.5 mg total) by mouth 2 (two) times daily with a meal.   60 tablet   1   . mirtazapine (REMERON) 15 MG tablet   Oral   Take 1 tablet (15 mg total) by mouth at bedtime.   30 tablet   1   . nitrofurantoin, macrocrystal-monohydrate, (MACROBID) 100 MG capsule   Oral   Take 1 capsule (100 mg total) by mouth every  12 (twelve) hours. Patient not taking: Reported on 01/16/2016   3 capsule   0   . prazosin (MINIPRESS) 2 MG capsule   Oral   Take 1 capsule (2 mg total) by mouth at bedtime.   30 capsule   1   . venlafaxine XR (EFFEXOR-XR) 150 MG 24 hr capsule   Oral   Take 2 capsules (300 mg total) by mouth daily with breakfast.   60 capsule   1     Allergies Depakote; Haldol; and Keflet  Family History  Problem Relation Age of Onset  . Diabetes Father     Social History Social History  Substance Use Topics  . Smoking status: Current Every Day Smoker -- 1.00 packs/day    Types: Cigarettes  . Smokeless  tobacco: None  . Alcohol Use: 3.6 oz/week    6 Cans of beer per week     Comment: tonight 6     Review of Systems Constitutional: No fever/chills Cardiovascular: Denies chest pain. Respiratory: Denies shortness of breath. Gastrointestinal: No abdominal pain.  No nausea, no vomiting.   Skin: Negative for rash. Neurological: Negative for headaches, focal weakness or numbness. Psychiatric:Positive for recurrent major depressive disorder. Positive for PTSD. Positive for borderline personality disorder. Positive for suicide attempt. Positive for opioid and alcohol use. Positive for anxiety.  10-point ROS otherwise negative.  ____________________________________________   PHYSICAL EXAM:  VITAL SIGNS: ED Triage Vitals  Enc Vitals Group     BP 02/16/16 0931 132/86 mmHg     Pulse Rate 02/16/16 0931 85     Resp 02/16/16 0931 18     Temp 02/16/16 0931 98.2 F (36.8 C)     Temp Source 02/16/16 0931 Oral     SpO2 02/16/16 0931 99 %     Weight 02/16/16 0929 235 lb (106.595 kg)     Height 02/16/16 0929  (1.727 m)     Head Cir --      Peak Flow --      Pain Score --      Pain Loc --      Pain Edu? --      Excl. in GC? --     Constitutional: Alert and oriented. Well appearing and in no acute distress. Eyes: Conjunctivae are normal. PERRL. EOMI. Head: Atraumatic. Nose: No congestion/rhinnorhea. Neck: No stridor.   Cardiovascular: Normal rate, regular rhythm. Grossly normal heart sounds.  Good peripheral circulation. Respiratory: Normal respiratory effort.  No retractions. Lungs CTAB. Musculoskeletal: Moves upper and lower extremities without any difficulty and normal gait was noted. Neurologic:  Normal speech and language. No gross focal neurologic deficits are appreciated. No gait instability. Skin:  Skin is warm, dry and intact. No rash noted. Psychiatric: Speech and behavior are normal at this time..  ____________________________________________   LABS (all labs  ordered are listed, but only abnormal results are displayed)  Labs Reviewed - No data to display  PROCEDURES  Procedure(s) performed: None  Procedures  Critical Care performed: No  ____________________________________________   INITIAL IMPRESSION / ASSESSMENT AND PLAN / ED COURSE  Pertinent labs & imaging results that were available during my care of the patient were reviewed by me and considered in my medical decision making (see chart for details).  Consult with Dr. Toni Amend for information about patient's prescriptions.  He likes to see the patient in the emergency room and renew her prescriptions. Patient was discharged with instructions to keep her next scheduled appointment with [facility]. ____________________________________________   FINAL CLINICAL  IMPRESSION(S) / ED DIAGNOSES  Final diagnoses:  Encounter for medication refill      NEW MEDICATIONS STARTED DURING THIS VISIT:  Discharge Medication List as of 02/16/2016  1:02 PM       Note:  This document was prepared using Dragon voice recognition software and may include unintentional dictation errors.    Tommi Rumps, PA-C 02/16/16 1745   Minna Antis, MD 02/19/16 [phone]

## 2016-04-03 ENCOUNTER — Inpatient Hospital Stay
Admission: EM | Admit: 2016-04-03 | Discharge: 2016-04-05 | DRG: 683 | Disposition: A | Discharge status: Home / Self Care | Payer: Medicaid Other | Attending: Internal Medicine | Admitting: Internal Medicine

## 2016-04-03 ENCOUNTER — Emergency Department: Payer: Medicaid Other

## 2016-04-03 ENCOUNTER — Encounter: Payer: Self-pay | Admitting: Emergency Medicine

## 2016-04-03 DIAGNOSIS — Z8673 Personal history of transient ischemic attack (TIA), and cerebral infarction without residual deficits: Secondary | ICD-10-CM | POA: Diagnosis not present

## 2016-04-03 DIAGNOSIS — K7010 Alcoholic hepatitis without ascites: Secondary | ICD-10-CM | POA: Diagnosis present

## 2016-04-03 DIAGNOSIS — Z79899 Other long term (current) drug therapy: Secondary | ICD-10-CM

## 2016-04-03 DIAGNOSIS — N179 Acute kidney failure, unspecified: Secondary | ICD-10-CM | POA: Diagnosis not present

## 2016-04-03 DIAGNOSIS — F17210 Nicotine dependence, cigarettes, uncomplicated: Secondary | ICD-10-CM | POA: Diagnosis present

## 2016-04-03 DIAGNOSIS — N390 Urinary tract infection, site not specified: Secondary | ICD-10-CM | POA: Diagnosis present

## 2016-04-03 DIAGNOSIS — Z9181 History of falling: Secondary | ICD-10-CM

## 2016-04-03 DIAGNOSIS — Z833 Family history of diabetes mellitus: Secondary | ICD-10-CM | POA: Diagnosis not present

## 2016-04-03 DIAGNOSIS — F1320 Sedative, hypnotic or anxiolytic dependence, uncomplicated: Secondary | ICD-10-CM | POA: Diagnosis present

## 2016-04-03 DIAGNOSIS — Z818 Family history of other mental and behavioral disorders: Secondary | ICD-10-CM | POA: Diagnosis not present

## 2016-04-03 DIAGNOSIS — F4310 Post-traumatic stress disorder, unspecified: Secondary | ICD-10-CM | POA: Diagnosis present

## 2016-04-03 DIAGNOSIS — F332 Major depressive disorder, recurrent severe without psychotic features: Secondary | ICD-10-CM | POA: Diagnosis present

## 2016-04-03 DIAGNOSIS — E860 Dehydration: Secondary | ICD-10-CM | POA: Diagnosis present

## 2016-04-03 DIAGNOSIS — R109 Unspecified abdominal pain: Secondary | ICD-10-CM

## 2016-04-03 DIAGNOSIS — R002 Palpitations: Secondary | ICD-10-CM | POA: Diagnosis present

## 2016-04-03 DIAGNOSIS — F5089 Other specified eating disorder: Secondary | ICD-10-CM

## 2016-04-03 DIAGNOSIS — Z888 Allergy status to other drugs, medicaments and biological substances status: Secondary | ICD-10-CM

## 2016-04-03 DIAGNOSIS — F331 Major depressive disorder, recurrent, moderate: Secondary | ICD-10-CM | POA: Diagnosis present

## 2016-04-03 DIAGNOSIS — F603 Borderline personality disorder: Secondary | ICD-10-CM | POA: Diagnosis present

## 2016-04-03 DIAGNOSIS — W19XXXA Unspecified fall, initial encounter: Secondary | ICD-10-CM

## 2016-04-03 LAB — SALICYLATE LEVEL: Salicylate Lvl: <4 mg/dL (ref 2.8–30.0)

## 2016-04-03 LAB — URINALYSIS COMPLETE WITH MICROSCOPIC (ARMC ONLY)
BACTERIA UA: NONE SEEN
Bilirubin Urine: NEGATIVE
Glucose, UA: NEGATIVE mg/dL
Nitrite: NEGATIVE
PH: 6 (ref 5.0–8.0)
Protein, ur: 100 mg/dL — AB
SPECIFIC GRAVITY, URINE: 1.018 (ref 1.005–1.030)

## 2016-04-03 LAB — CBC
HEMATOCRIT: 54.6 % — AB (ref 35.0–47.0)
HEMOGLOBIN: 18.7 g/dL — AB (ref 12.0–16.0)
MCH: 31.8 pg (ref 26.0–34.0)
MCHC: 34.3 g/dL (ref 32.0–36.0)
MCV: 92.9 fL (ref 80.0–100.0)
Platelets: 411 10*3/uL (ref 150–440)
RBC: 5.87 MIL/uL — AB (ref 3.80–5.20)
RDW: 13.5 % (ref 11.5–14.5)
WBC: 15.5 10*3/uL — ABNORMAL HIGH (ref 3.6–11.0)

## 2016-04-03 LAB — COMPREHENSIVE METABOLIC PANEL
ALT: 41 U/L (ref 14–54)
AST: 45 U/L — ABNORMAL HIGH (ref 15–41)
Albumin: 5 g/dL (ref 3.5–5.0)
Alkaline Phosphatase: 98 U/L (ref 38–126)
Anion gap: 22 — ABNORMAL HIGH (ref 5–15)
BUN: 18 mg/dL (ref 6–20)
CHLORIDE: 100 mmol/L — AB (ref 101–111)
CO2: 8 mmol/L — AB (ref 22–32)
CREATININE: 1.98 mg/dL — AB (ref 0.44–1.00)
Calcium: 10.4 mg/dL — ABNORMAL HIGH (ref 8.9–10.3)
GFR calc non Af Amer: 31 mL/min — ABNORMAL LOW (ref >60)
GFR, EST AFRICAN AMERICAN: 36 mL/min — AB (ref >60)
Glucose, Bld: 166 mg/dL — ABNORMAL HIGH (ref 65–99)
POTASSIUM: 3.9 mmol/L (ref 3.5–5.1)
SODIUM: 130 mmol/L — AB (ref 135–145)
Total Bilirubin: 1.8 mg/dL — ABNORMAL HIGH (ref 0.3–1.2)
Total Protein: 10.3 g/dL — ABNORMAL HIGH (ref 6.5–8.1)

## 2016-04-03 LAB — PREGNANCY, URINE: Preg Test, Ur: NEGATIVE

## 2016-04-03 LAB — LACTIC ACID, PLASMA: LACTIC ACID, VENOUS: 2.8 mmol/L — AB (ref 0.5–1.9)

## 2016-04-03 LAB — ETHANOL: Alcohol, Ethyl (B): <5 mg/dL (ref <5)

## 2016-04-03 LAB — LIPASE, BLOOD: LIPASE: 29 U/L (ref 11–51)

## 2016-04-03 LAB — TROPONIN I: Troponin I: <0.03 ng/mL (ref <0.03)

## 2016-04-03 LAB — ACETAMINOPHEN LEVEL: Acetaminophen (Tylenol), Serum: <10 ug/mL — ABNORMAL LOW (ref 10–30)

## 2016-04-03 MED ORDER — ONDANSETRON HCL 4 MG/2ML IJ SOLN
4.0000 mg | Freq: Once | INTRAMUSCULAR | Status: AC
  Administered 2016-04-03: 4 mg via INTRAVENOUS
  Filled 2016-04-03: qty 2

## 2016-04-03 MED ORDER — PRAZOSIN HCL 1 MG PO CAPS
2.0000 mg | ORAL_CAPSULE | Freq: Every day | ORAL | Status: DC
  Administered 2016-04-04: 2 mg via ORAL
  Filled 2016-04-03 (×2): qty 2

## 2016-04-03 MED ORDER — VENLAFAXINE HCL ER 75 MG PO CP24
300.0000 mg | ORAL_CAPSULE | Freq: Every day | ORAL | Status: DC
  Administered 2016-04-04 – 2016-04-05 (×2): 300 mg via ORAL
  Filled 2016-04-03 (×2): qty 4

## 2016-04-03 MED ORDER — LEVOFLOXACIN IN D5W 500 MG/100ML IV SOLN
500.0000 mg | Freq: Once | INTRAVENOUS | Status: AC
  Administered 2016-04-03: 500 mg via INTRAVENOUS
  Filled 2016-04-03 (×2): qty 100

## 2016-04-03 MED ORDER — ACETAMINOPHEN 650 MG RE SUPP: 650.0000 mg | Freq: Four times a day (QID) | RECTAL | Status: DC | PRN

## 2016-04-03 MED ORDER — MIRTAZAPINE 15 MG PO TABS
15.0000 mg | ORAL_TABLET | Freq: Every day | ORAL | Status: DC
  Administered 2016-04-04: 15 mg via ORAL
  Filled 2016-04-03 (×2): qty 1

## 2016-04-03 MED ORDER — LORAZEPAM 2 MG/ML IJ SOLN
1.0000 mg | Freq: Once | INTRAMUSCULAR | Status: AC
  Administered 2016-04-03: 1 mg via INTRAVENOUS
  Filled 2016-04-03: qty 1

## 2016-04-03 MED ORDER — LORAZEPAM 2 MG/ML IJ SOLN
2.0000 mg | Freq: Once | INTRAMUSCULAR | Status: AC
  Administered 2016-04-03: 2 mg via INTRAVENOUS
  Filled 2016-04-03: qty 1

## 2016-04-03 MED ORDER — PROMETHAZINE HCL 25 MG/ML IJ SOLN
12.5000 mg | Freq: Once | INTRAMUSCULAR | Status: AC
  Administered 2016-04-03: 12.5 mg via INTRAVENOUS
  Filled 2016-04-03 (×2): qty 1

## 2016-04-03 MED ORDER — GABAPENTIN 300 MG PO CAPS
900.0000 mg | ORAL_CAPSULE | Freq: Three times a day (TID) | ORAL | Status: DC
  Administered 2016-04-04 – 2016-04-05 (×3): 900 mg via ORAL
  Filled 2016-04-03 (×4): qty 3

## 2016-04-03 MED ORDER — HYDROXYZINE PAMOATE 25 MG PO CAPS: 25.0000 mg | ORAL_CAPSULE | Freq: Three times a day (TID) | ORAL | Status: DC | PRN

## 2016-04-03 MED ORDER — SODIUM CHLORIDE 0.9% FLUSH
3.0000 mL | Freq: Two times a day (BID) | INTRAVENOUS | Status: DC
  Administered 2016-04-05: 3 mL via INTRAVENOUS

## 2016-04-03 MED ORDER — ACETAMINOPHEN 325 MG PO TABS: 650.0000 mg | ORAL_TABLET | Freq: Four times a day (QID) | ORAL | Status: DC | PRN

## 2016-04-03 MED ORDER — HYDROXYZINE HCL 25 MG PO TABS
25.0000 mg | ORAL_TABLET | Freq: Three times a day (TID) | ORAL | Status: DC | PRN
  Filled 2016-04-03: qty 1

## 2016-04-03 MED ORDER — ONDANSETRON HCL 4 MG PO TABS
4.0000 mg | ORAL_TABLET | Freq: Four times a day (QID) | ORAL | Status: DC | PRN
  Administered 2016-04-05: 4 mg via ORAL
  Filled 2016-04-03: qty 1

## 2016-04-03 MED ORDER — SODIUM CHLORIDE 0.9 % IV BOLUS (SEPSIS)
1000.0000 mL | Freq: Once | INTRAVENOUS | Status: AC
Start: 2016-04-03 — End: 2016-04-03
  Administered 2016-04-03: 1000 mL via INTRAVENOUS

## 2016-04-03 MED ORDER — SODIUM CHLORIDE 0.9 % IV SOLN
INTRAVENOUS | Status: DC
  Administered 2016-04-03 – 2016-04-05 (×3): via INTRAVENOUS

## 2016-04-03 MED ORDER — ONDANSETRON HCL 4 MG/2ML IJ SOLN: 4.0000 mg | Freq: Four times a day (QID) | INTRAMUSCULAR | Status: DC | PRN

## 2016-04-03 NOTE — ED Triage Notes (Signed)
Pt to ED from home c/o nausea and vomiting since last week.  Pt states stressed recently with passing of friend and living situation and has had nausea and vomiting continuously along with chest pain, back pain, and SOB.  Pt reports feels similar to previous anxiety attacks and symptoms.

## 2016-04-03 NOTE — ED Notes (Addendum)
One small emesis noted. Patient states she is still nauseous, but feels better.

## 2016-04-03 NOTE — ED Provider Notes (Signed)
Time Seen: Approximately 1950 I have reviewed the triage notes  Chief Complaint: Nausea; Emesis; and Anxiety   History of Present Illness: Sandy Mason is a 40 y.o. female who states that she's had some persistent nausea and vomiting over the past week. And states one of her friends recently died and she's had poor living situation describe some bedbugs etc. She states with the vomiting she's developed some side pain and points mainly to both upper quadrants and flank pain of the abdomen along with chest pain and some shortness of breath. She states she's had a history of anxiety with similar symptoms. Review of her records shows she was here for similar back in May. Patient has a long psychiatric history and is adamant that she did not overdose on any medications. She also has history of major depressive disorder and denies any suicidal thoughts or homicidal thoughts. She denies any illicit drug usage   Past Medical History:  Diagnosis Date  . Alcohol use disorder severe. 12/18/2014  . Anxiety   . Borderline personality disorder 12/18/2014  . CVA (cerebral infarction)   . Depression   . 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 Opioid use disorder, severe, dependence (HCC) 12/20/2014  . PTSD (post-traumatic stress disorder) 03/28/2015  . 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 Seizures (HCC)   . Suicide attempt by hanging [facility](HCC)     Patient Active Problem List   Diagnosis Date Noted  . Suicidal behavior 01/16/2016  . Alcohol intoxication (HCC)   . Severe episode of recurrent major depressive disorder, without psychotic features (HCC)   . Malingering 12/16/2015  . Major depressive disorder, recurrent episode, moderate (HCC) 12/12/2015  . Closed pelvic fracture (HCC) 10/31/2015  . Tobacco abuse 10/31/2015  . Tobacco use disorder 03/28/2015  . PTSD (post-traumatic stress disorder) 03/28/2015  .  Stimulant use disorder (cocaine) 03/28/2015  . 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 Opioid use disorder, severe, dependence (HCC) 12/20/2014  . Alcohol use disorder severe. 12/18/2014  . Borderline personality disorder 12/18/2014    History reviewed. No pertinent surgical history.  History reviewed. No pertinent surgical history.  Current Outpatient Rx  . Order #: [id number] Class: Print  . Order #: [id number] Class: Print  . Order #: [id number] Class: Print  . Order #: [id number] Class: Print  . Order #: [id number] Class: Print  . Order #: [id number] Class: Print  . Order #: [id number] Class: Print  . Order #: [id number] Class: Print  . Order #: [id number] Class: Print    Allergies:  Depakote [valproic acid]; Haldol [haloperidol lactate]; and Keflet [cephalexin]  Family History: Family History  Problem Relation Age of Onset  . Diabetes Father     Social History: Social History  Substance Use Topics  . Smoking status: Current Every Day Smoker    Packs/day: 1.00    Types: Cigarettes  . Smokeless tobacco: Never Used  . Alcohol use Yes     Review of Systems:   10 point review of systems was performed and was otherwise negative:  Constitutional: No fever Eyes: No visual disturbances ENT: No sore throat, ear pain Cardiac: No chest pain Respiratory: No shortness of breath, wheezing, or stridor Abdomen: Bilateral flank pain with vomiting Endocrine: No weight loss, No night sweats Extremities: No peripheral edema, cyanosis Skin: No rashes, easy bruising Neurologic: No focal weakness, trouble with speech or swollowing Urologic: No dysuria, Hematuria, or urinary frequency No hematemesis or obvious biliary emesis. No  history of constipation or diarrhea. No recent travel  Physical Exam:  ED Triage Vitals  Enc Vitals Group     BP 04/03/16 1746 111/70     Pulse Rate 04/03/16 1746 (!) 120     Resp 04/03/16 1746 20     Temp 04/03/16  1746 97.8 F (36.6 C)     Temp Source 04/03/16 1746 Oral     SpO2 04/03/16 1746 99 %     Weight 04/03/16 1747 200 lb (90.7 kg)     Height 04/03/16 1747 5\' 8"  (1.727 m)     Head Circumference --      Peak Flow --      Pain Score 04/03/16 1753 10     Pain Loc --      Pain Edu? --      Excl. in GC? --     General: Awake , Alert , and Oriented times 3; GCS 15. Periodically dry heaving with no actual emesis. Head: Normal cephalic , atraumatic Eyes: Pupils equal , round, reactive to light Nose/Throat: No nasal drainage, patent upper airway without erythema or exudate.  Neck: Supple, Full range of motion, No anterior adenopathy or palpable thyroid masses Lungs: Clear to ascultation without wheezes , rhonchi, or rales Heart: Regular rate, regular rhythm without murmurs , gallops , or rubs Abdomen: Soft, non tender without rebound, guarding , or rigidity; bowel sounds positive and symmetric in all 4 quadrants. No organomegaly .        Extremities: 2 plus symmetric pulses. No edema, clubbing or cyanosis Neurologic: normal ambulation, Motor symmetric without deficits, sensory intact Skin: warm, dry, no rashes   Labs:   All laboratory work was reviewed including any pertinent negatives or positives listed below:  Labs Reviewed  COMPREHENSIVE METABOLIC PANEL - Abnormal; Notable for the following:       Result Value   Sodium 130 (*)    Chloride 100 (*)    CO2 8 (*)    Glucose, Bld 166 (*)    Creatinine, Ser 1.98 (*)    Calcium 10.4 (*)    Total Protein 10.3 (*)    AST 45 (*)    Total Bilirubin 1.8 (*)    GFR calc non Af Amer 31 (*)    GFR calc Af Amer 36 (*)    Anion gap 22 (*)    All other components within normal limits  CBC - Abnormal; Notable for the following:    WBC 15.5 (*)    RBC 5.87 (*)    Hemoglobin 18.7 (*)    HCT 54.6 (*)    All other components within normal limits  URINALYSIS COMPLETEWITH MICROSCOPIC ([facility] ONLY) - Abnormal; Notable for the following:    Color,  Urine AMBER (*)    APPearance CLOUDY (*)    Ketones, ur 2+ (*)    Hgb urine dipstick 2+ (*)    Protein, ur 100 (*)    Leukocytes, UA 3+ (*)    Squamous Epithelial / LPF 6-30 (*)    All other components within normal limits  LACTIC ACID, PLASMA - Abnormal; Notable for the following:    Lactic Acid, Venous 2.8 (*)    All other components within normal limits  ACETAMINOPHEN LEVEL - Abnormal; Notable for the following:    Acetaminophen (Tylenol), Serum <10 (*)    All other components within normal limits  URINE CULTURE  LIPASE, BLOOD  TROPONIN I  PREGNANCY, URINE  SALICYLATE LEVEL  BLOOD GAS, VENOUS  URINE  DRUG SCREEN, QUALITATIVE ([facility] ONLY)  ETHANOL   Review of laboratory work shows significant acidosis. Renal insufficiency EKG:  ED ECG REPORT I, Jennye Moccasin, the attending physician, personally viewed and interpreted this ECG.  Date: 04/03/2016 EKG Time: 1752 Rate: 127 Rhythm: Sinus tachycardia QRS Axis: normal Intervals: Left axis deviation ST/T Wave abnormalities: Nonspecific ST-T wave abnormality Conduction Disturbances: none Narrative Interpretation: unremarkable No obvious acute ischemic changes   Radiology: * "Dg Chest 2 View  Result Date: 04/03/2016 CLINICAL DATA:  40 year old female with chest pain EXAM: CHEST  2 VIEW COMPARISON:  Chest radiograph dated 12/10/2015 FINDINGS: Two views of the chest do not demonstrate a focal consolidation. There is no pleural effusion or pneumothorax. The cardiac silhouette is within normal limits. There is a exaggerated thoracolumbar kyphosis. No acute osseous pathology. IMPRESSION: No active cardiopulmonary disease. Electronically Signed   By: Elgie Collard M.D.   On: 04/03/2016 20:16   Ct Renal Stone Study  Result Date: 04/03/2016 CLINICAL DATA:  Nausea and vomiting since last week. Bilateral flank pain. EXAM: CT ABDOMEN AND PELVIS WITHOUT CONTRAST TECHNIQUE: Multidetector CT imaging of the abdomen and pelvis was performed  following the standard protocol without IV contrast. COMPARISON:  Pelvic CT 10/31/2015.  CT abdomen pelvis 02/03/2013. FINDINGS: Lower chest: Mild scarring. No active process. No pleural or pericardial fluid. Hepatobiliary: Normal appearance without contrast. No calcified gallstones. Pancreas: Normal Spleen: Normal Adrenals/Urinary Tract: No adrenal abnormality. No renal abnormality. No cyst, mass, stone or hydronephrosis. Stomach/Bowel: Normal Vascular/Lymphatic: Aorta and IVC are normal. No retroperitoneal mass or lymphadenopathy. Reproductive: IUD in place. Uterus and an adnexal regions otherwise unremarkable. Other: None. Musculoskeletal: Healing superior and inferior rami fractures on the left. IMPRESSION: No acute or significant finding. No cause of the presenting symptoms is identified. Healing pubic rami fractures on the left. Electronically Signed   By: Paulina Fusi M.D.   On: 04/03/2016 21:34  "  I personally reviewed the radiologic studies    ED Course:  Patient was started on IV fluid resuscitation along with anti-medic therapy. She received Zofran which had no effect and was initiated on Ativan and Phenergan which had some decrease in the frequency of her dry heaves. Patient has significant laboratory work abnormalities including an anion gap acidosis with a low bicarbonate of 8. Venous blood gas is pending at the time of dictation the lactic acid level is elevated. Bile and I felt this was unlikely to be a septic state at this point though she does have findings on her laboratory work of a urinary tract infection. Patient has urine culture pending. She was initiated on IV antibiotics and because of an hour G to Keflex was started on Levaquin. * Clinical Course     Assessment:  Persistent vomiting , possibly psychogenic Renal insufficiency Acidosis Urinary tract infection  Final Clinical Impression:  Final diagnoses:  Bilateral flank pain  Psychogenic vomiting with nausea      Plan: * Inpatient management           Jennye Moccasin, MD 04/03/16 2225

## 2016-04-04 ENCOUNTER — Inpatient Hospital Stay: Payer: Medicaid Other

## 2016-04-04 LAB — BLOOD GAS, VENOUS
ACID-BASE DEFICIT: 16.6 mmol/L — AB (ref 0.0–2.0)
Bicarbonate: 12.4 mmol/L — ABNORMAL LOW (ref 20.0–28.0)
PATIENT TEMPERATURE: 37
pCO2, Ven: 40 mmHg — ABNORMAL LOW (ref 44.0–60.0)
pH, Ven: 7.1 — CL (ref 7.250–7.430)
pO2, Ven: <31 mmHg — CL (ref 32.0–45.0)

## 2016-04-04 LAB — URINE DRUG SCREEN, QUALITATIVE (ARMC ONLY)
Amphetamines, Ur Screen: NOT DETECTED
BARBITURATES, UR SCREEN: NOT DETECTED
Benzodiazepine, Ur Scrn: NOT DETECTED
CANNABINOID 50 NG, UR ~~LOC~~: NOT DETECTED
COCAINE METABOLITE, UR ~~LOC~~: NOT DETECTED
MDMA (ECSTASY) UR SCREEN: NOT DETECTED
METHADONE SCREEN, URINE: NOT DETECTED
Opiate, Ur Screen: NOT DETECTED
Phencyclidine (PCP) Ur S: NOT DETECTED
TRICYCLIC, UR SCREEN: NOT DETECTED

## 2016-04-04 LAB — C-REACTIVE PROTEIN: CRP: 3.9 mg/dL — ABNORMAL HIGH (ref <1.0)

## 2016-04-04 LAB — BILIRUBIN, FRACTIONATED(TOT/DIR/INDIR)
BILIRUBIN DIRECT: 0.2 mg/dL (ref 0.1–0.5)
BILIRUBIN INDIRECT: 1.3 mg/dL — AB (ref 0.3–0.9)
BILIRUBIN TOTAL: 1.5 mg/dL — AB (ref 0.3–1.2)

## 2016-04-04 LAB — COMPREHENSIVE METABOLIC PANEL
ALBUMIN: 4.4 g/dL (ref 3.5–5.0)
ALK PHOS: 82 U/L (ref 38–126)
ALT: 33 U/L (ref 14–54)
AST: 29 U/L (ref 15–41)
Anion gap: 16 — ABNORMAL HIGH (ref 5–15)
BUN: 16 mg/dL (ref 6–20)
CALCIUM: 9.5 mg/dL (ref 8.9–10.3)
CO2: 12 mmol/L — AB (ref 22–32)
CREATININE: 1.08 mg/dL — AB (ref 0.44–1.00)
Chloride: 105 mmol/L (ref 101–111)
GFR calc Af Amer: >60 mL/min (ref >60)
GFR calc non Af Amer: >60 mL/min (ref >60)
GLUCOSE: 174 mg/dL — AB (ref 65–99)
Potassium: 3.6 mmol/L (ref 3.5–5.1)
SODIUM: 133 mmol/L — AB (ref 135–145)
Total Bilirubin: 0.9 mg/dL (ref 0.3–1.2)
Total Protein: 9.2 g/dL — ABNORMAL HIGH (ref 6.5–8.1)

## 2016-04-04 LAB — CK: Total CK: 42 U/L (ref 38–234)

## 2016-04-04 LAB — PHOSPHORUS: PHOSPHORUS: 4.6 mg/dL (ref 2.5–4.6)

## 2016-04-04 LAB — MAGNESIUM: MAGNESIUM: 1.8 mg/dL (ref 1.7–2.4)

## 2016-04-04 LAB — CBC
HCT: 47.3 % — ABNORMAL HIGH (ref 35.0–47.0)
HEMOGLOBIN: 16.7 g/dL — AB (ref 12.0–16.0)
MCH: 32.8 pg (ref 26.0–34.0)
MCHC: 35.3 g/dL (ref 32.0–36.0)
MCV: 92.9 fL (ref 80.0–100.0)
Platelets: 350 10*3/uL (ref 150–440)
RBC: 5.1 MIL/uL (ref 3.80–5.20)
RDW: 13.5 % (ref 11.5–14.5)
WBC: 15.5 10*3/uL — ABNORMAL HIGH (ref 3.6–11.0)

## 2016-04-04 LAB — TROPONIN I
Troponin I: <0.03 ng/mL (ref <0.03)
Troponin I: <0.03 ng/mL (ref <0.03)

## 2016-04-04 MED ORDER — MORPHINE SULFATE (PF) 2 MG/ML IV SOLN
2.0000 mg | INTRAVENOUS | Status: DC | PRN
  Administered 2016-04-04: 2 mg via INTRAVENOUS
  Filled 2016-04-04: qty 1

## 2016-04-04 MED ORDER — METOPROLOL TARTRATE 5 MG/5ML IV SOLN
2.5000 mg | Freq: Once | INTRAVENOUS | Status: AC
  Administered 2016-04-04: 2.5 mg via INTRAVENOUS
  Filled 2016-04-04: qty 5

## 2016-04-04 MED ORDER — LORAZEPAM 1 MG PO TABS: 1.0000 mg | ORAL_TABLET | Freq: Four times a day (QID) | ORAL | Status: DC | PRN

## 2016-04-04 MED ORDER — ASPIRIN 81 MG PO CHEW
81.0000 mg | CHEWABLE_TABLET | Freq: Once | ORAL | Status: AC
  Administered 2016-04-04: 81 mg via ORAL
  Filled 2016-04-04: qty 1

## 2016-04-04 MED ORDER — VITAMIN B-1 100 MG PO TABS
100.0000 mg | ORAL_TABLET | Freq: Every day | ORAL | Status: DC
  Administered 2016-04-04 – 2016-04-05 (×2): 100 mg via ORAL
  Filled 2016-04-04 (×2): qty 1

## 2016-04-04 MED ORDER — FAMOTIDINE IN NACL 20-0.9 MG/50ML-% IV SOLN
20.0000 mg | Freq: Two times a day (BID) | INTRAVENOUS | Status: DC
  Filled 2016-04-04 (×2): qty 50

## 2016-04-04 MED ORDER — LEVOFLOXACIN IN D5W 750 MG/150ML IV SOLN
750.0000 mg | Freq: Once | INTRAVENOUS | Status: AC
  Administered 2016-04-04: 750 mg via INTRAVENOUS
  Filled 2016-04-04: qty 150

## 2016-04-04 MED ORDER — LORAZEPAM 2 MG PO TABS: 0.0000 mg | ORAL_TABLET | Freq: Four times a day (QID) | ORAL | Status: DC

## 2016-04-04 MED ORDER — LORAZEPAM 2 MG/ML IJ SOLN
1.0000 mg | Freq: Four times a day (QID) | INTRAMUSCULAR | Status: DC | PRN
  Administered 2016-04-04: 1 mg via INTRAVENOUS
  Filled 2016-04-04: qty 1

## 2016-04-04 MED ORDER — METOPROLOL TARTRATE 25 MG PO TABS
12.5000 mg | ORAL_TABLET | Freq: Two times a day (BID) | ORAL | Status: DC
  Administered 2016-04-04 – 2016-04-05 (×3): 12.5 mg via ORAL
  Filled 2016-04-04 (×3): qty 1

## 2016-04-04 MED ORDER — LEVOFLOXACIN IN D5W 750 MG/150ML IV SOLN: 750.0000 mg | INTRAVENOUS | Status: DC

## 2016-04-04 MED ORDER — TRAMADOL HCL 50 MG PO TABS
50.0000 mg | ORAL_TABLET | Freq: Four times a day (QID) | ORAL | Status: DC | PRN
  Administered 2016-04-04 – 2016-04-05 (×2): 50 mg via ORAL
  Filled 2016-04-04 (×3): qty 1

## 2016-04-04 MED ORDER — LEVOFLOXACIN IN D5W 250 MG/50ML IV SOLN: 250.0000 mg | INTRAVENOUS | Status: DC

## 2016-04-04 MED ORDER — METOPROLOL TARTRATE 5 MG/5ML IV SOLN
5.0000 mg | Freq: Once | INTRAVENOUS | Status: AC
  Administered 2016-04-04: 5 mg via INTRAVENOUS
  Filled 2016-04-04: qty 5

## 2016-04-04 MED ORDER — KETOROLAC TROMETHAMINE 15 MG/ML IJ SOLN
15.0000 mg | Freq: Three times a day (TID) | INTRAMUSCULAR | Status: DC | PRN
  Administered 2016-04-04 (×2): 15 mg via INTRAVENOUS
  Filled 2016-04-04 (×2): qty 1

## 2016-04-04 MED ORDER — VITAMIN B-1 100 MG PO TABS: 100.0000 mg | ORAL_TABLET | Freq: Every day | ORAL | Status: DC

## 2016-04-04 MED ORDER — THIAMINE HCL 100 MG/ML IJ SOLN: 100.0000 mg | Freq: Every day | INTRAMUSCULAR | Status: DC

## 2016-04-04 MED ORDER — ADULT MULTIVITAMIN W/MINERALS CH
1.0000 | ORAL_TABLET | Freq: Every day | ORAL | Status: DC
  Administered 2016-04-04 – 2016-04-05 (×2): 1 via ORAL
  Filled 2016-04-04 (×2): qty 1

## 2016-04-04 MED ORDER — LORAZEPAM BOLUS VIA INFUSION: 0.2500 mg | Freq: Four times a day (QID) | INTRAVENOUS | Status: DC | PRN

## 2016-04-04 MED ORDER — LEVOFLOXACIN 500 MG PO TABS
250.0000 mg | ORAL_TABLET | Freq: Every day | ORAL | Status: DC
  Administered 2016-04-05: 250 mg via ORAL
  Filled 2016-04-04: qty 1

## 2016-04-04 MED ORDER — FOLIC ACID 1 MG PO TABS
1.0000 mg | ORAL_TABLET | Freq: Every day | ORAL | Status: DC
  Administered 2016-04-04 – 2016-04-05 (×2): 1 mg via ORAL
  Filled 2016-04-04 (×2): qty 1

## 2016-04-04 MED ORDER — LORAZEPAM 2 MG/ML IJ SOLN
0.2500 mg | Freq: Four times a day (QID) | INTRAMUSCULAR | Status: DC | PRN
  Administered 2016-04-04 – 2016-04-05 (×3): 0.25 mg via INTRAVENOUS
  Filled 2016-04-04 (×3): qty 1

## 2016-04-04 MED ORDER — ADULT MULTIVITAMIN W/MINERALS CH: 1.0000 | ORAL_TABLET | Freq: Every day | ORAL | Status: DC

## 2016-04-04 MED ORDER — PROMETHAZINE HCL 25 MG/ML IJ SOLN
25.0000 mg | Freq: Four times a day (QID) | INTRAMUSCULAR | Status: DC | PRN
  Administered 2016-04-04 (×3): 25 mg via INTRAVENOUS
  Filled 2016-04-04 (×3): qty 1

## 2016-04-04 MED ORDER — FOLIC ACID 1 MG PO TABS: 1.0000 mg | ORAL_TABLET | Freq: Every day | ORAL | Status: DC

## 2016-04-04 MED ORDER — LORAZEPAM 2 MG/ML IJ SOLN: 0.0000 mg | Freq: Two times a day (BID) | INTRAMUSCULAR | Status: DC

## 2016-04-04 MED ORDER — LORAZEPAM 2 MG PO TABS: 0.0000 mg | ORAL_TABLET | Freq: Two times a day (BID) | ORAL | Status: DC

## 2016-04-04 MED ORDER — LORAZEPAM 2 MG/ML IJ SOLN
0.0000 mg | Freq: Four times a day (QID) | INTRAMUSCULAR | Status: DC
  Administered 2016-04-04 – 2016-04-05 (×4): 1 mg via INTRAVENOUS
  Filled 2016-04-04 (×4): qty 1

## 2016-04-04 MED ORDER — LORAZEPAM 2 MG/ML IJ SOLN: 1.0000 mg | Freq: Four times a day (QID) | INTRAMUSCULAR | Status: DC | PRN

## 2016-04-04 MED ORDER — FAMOTIDINE 20 MG PO TABS
20.0000 mg | ORAL_TABLET | Freq: Every day | ORAL | Status: DC
  Administered 2016-04-04 – 2016-04-05 (×2): 20 mg via ORAL
  Filled 2016-04-04 (×2): qty 1

## 2016-04-04 NOTE — Progress Notes (Signed)
Pharmacy Antibiotic Note  Sandy Mason is a 40 y.o. female admitted on 04/03/2016 with UTI.  Pharmacy has been consulted for Levaquin dosing.  Plan: Levaquin 750 mg q 48 hours ordered.  Height: 5\' 8"  (172.7 cm) Weight: 198 lb 9.6 oz (90.1 kg) IBW/kg (Calculated) : 63.9  Temp (24hrs), Avg:97.7 F (36.5 C), Min:97.6 F (36.4 C), Max:97.8 F (36.6 C)   Recent Labs Lab 04/03/16 1801 04/03/16 2115  WBC 15.5*  --   CREATININE 1.98*  --   LATICACIDVEN  --  2.8*    Estimated Creatinine Clearance: 44.8 mL/min (by C-G formula based on SCr of 1.98 mg/dL).    Allergies  Allergen Reactions  . Depakote [Valproic Acid] Other (See Comments)    Reaction:  Seizures   . Haldol [Haloperidol Lactate] Other (See Comments)    Reaction:  Seizures   . Keflet [Cephalexin] Rash    Antimicrobials this admission: Levaquin  >>    >>   Dose adjustments this admission:   Microbiology results:  9/5 UCx: pending    9/5 UA: LE(+) NO2(-) WBC TNTC  Thank you for allowing pharmacy to be a part of this patient's care.  McBane,Matthew S 04/04/2016 12:33 AM

## 2016-04-04 NOTE — Care Management (Signed)
Patient presents from home with vomiting and diarrhea.  Says onset of symptoms happened the same day that she received some very bad news that a friend of hers had died.  Patient lives in a boarding house and is going to get evicted.  She has been planning on asking a friend if she can move in with him but has not gotten around to make the call.  She is followed by [facility] and has a Theatre stage managerpeer advisor.  She does not have a medical PCP.   Provided patient with application for [facility] for medical issues.  She does not have any concerns obtaining medications or appointment follow up for her mental illness.  There is a csw referral pending.

## 2016-04-04 NOTE — Progress Notes (Signed)
[facility] [place] [facility]   PATIENT NAME: Sandy JaffeJennifer Mason    MR#:  [id number]  DATE OF BIRTH:  January 08, 1976  SUBJECTIVE:  Feeling very anxious. No SI Palpitations HR in the 120's No vomiting. Wants to try regular diet  REVIEW OF SYSTEMS:   Review of Systems  Constitutional: Negative for chills, fever and weight loss.  HENT: Negative for ear discharge, ear pain and nosebleeds.   Eyes: Negative for blurred vision, pain and discharge.  Respiratory: Negative for sputum production, shortness of breath, wheezing and stridor.   Cardiovascular: Negative for chest pain, palpitations, orthopnea and PND.  Gastrointestinal: Negative for abdominal pain, diarrhea, nausea and vomiting.  Genitourinary: Negative for frequency and urgency.  Musculoskeletal: Negative for back pain and joint pain.  Neurological: Positive for weakness. Negative for sensory change, speech change and focal weakness.  Psychiatric/Behavioral: Negative for depression and hallucinations. The patient is nervous/anxious.    Tolerating Diet: some Tolerating PT: ambulatory  DRUG ALLERGIES:   Allergies  Allergen Reactions  . Depakote [Valproic Acid] Other (See Comments)    Reaction:  Seizures   . Haldol [Haloperidol Lactate] Other (See Comments)    Reaction:  Seizures   . Keflet [Cephalexin] Rash    VITALS:  Blood pressure 116/70, pulse (!) 121, temperature 98.7 F (37.1 C), temperature source Oral, resp. rate 16, height 5\' 8"  (1.727 m), weight 90.1 kg (198 lb 9.6 oz), SpO2 96 %.  PHYSICAL EXAMINATION:   Physical Exam  GENERAL:  40 y.o.-year-old patient lying in the bed with no acute distress.  EYES: Pupils equal, round, reactive to light and accommodation. No scleral icterus. Extraocular muscles intact.  HEENT: Head atraumatic, normocephalic. Oropharynx and nasopharynx clear.  NECK:  Supple, no jugular venous distention. No thyroid enlargement, no tenderness.  LUNGS: Normal breath  sounds bilaterally, no wheezing, rales, rhonchi. No use of accessory muscles of respiration.  CARDIOVASCULAR: S1, S2 normal. No murmurs, rubs, or gallops. Tachycardia++ ABDOMEN: Soft, nontender, nondistended. Bowel sounds present. No organomegaly or mass.  EXTREMITIES: No cyanosis, clubbing or edema b/l.    NEUROLOGIC: Cranial nerves II through XII are intact. No focal Motor or sensory deficits b/l.   PSYCHIATRIC:  patient is alert and oriented x 3.  SKIN: No obvious rash, lesion, or ulcer.   LABORATORY PANEL:  CBC  Recent Labs Lab 04/04/16 0452  WBC 15.5*  HGB 16.7*  HCT 47.3*  PLT 350    Chemistries   Recent Labs Lab 04/03/16 2328 04/04/16 0452  NA  --  133*  K  --  3.6  CL  --  105  CO2  --  12*  GLUCOSE  --  174*  BUN  --  16  CREATININE  --  1.08*  CALCIUM  --  9.5  MG 1.8  --   AST  --  29  ALT  --  33  ALKPHOS  --  82  BILITOT 1.5* 0.9   Cardiac Enzymes  Recent Labs Lab 04/04/16 1053  TROPONINI <0.03   RADIOLOGY:  Dg Chest 2 View  Result Date: 04/03/2016 CLINICAL DATA:  40 year old female with chest pain EXAM: CHEST  2 VIEW COMPARISON:  Chest radiograph dated 12/10/2015 FINDINGS: Two views of the chest do not demonstrate a focal consolidation. There is no pleural effusion or pneumothorax. The cardiac silhouette is within normal limits. There is a exaggerated thoracolumbar kyphosis. No acute osseous pathology. IMPRESSION: No active cardiopulmonary disease. Electronically Signed   By: Ceasar MonsArash  Radparvar M.D.  On: 04/03/2016 20:16   Dg Pelvis 1-2 Views  Result Date: 04/04/2016 CLINICAL DATA:  Pt fell in April and hurt her pelvis. Pain on left side still. States she never did PT. Negative preg test. EXAM: PELVIS - 1-2 VIEW COMPARISON:  None. FINDINGS: Healing fracture of the LEFT superior and inferior pubic rami. The callus formation about the fracture sites. Hips are located. No sacral fracture. IUD noted. IMPRESSION: Healing fractures of the LEFT superior and  inferior pubic rami. Electronically Signed   By: Genevive Bi M.D.   On: 04/04/2016 10:04   US Abdomen Complete  Result Date: 04/04/2016 CLINICAL DATA:  Abdominal pain. EXAM: ABDOMEN ULTRASOUND COMPLETE COMPARISON:  04/03/2016. FINDINGS: Gallbladder: No gallstones or wall thickening visualized. No sonographic Murphy sign noted by sonographer. Common bile duct: Diameter: 2.1 mm Liver: Increased echogenicity consistent with fatty infiltration and/or hepatocellular disease. No focal hepatic abnormality identified . IVC: No abnormality visualized. Pancreas: Visualized portion unremarkable. Spleen: Size and appearance within normal limits. Right Kidney: Length: 12.2 cm. Echogenicity within normal limits. No mass or hydronephrosis visualized. Left Kidney: Length: 12.1 cm. Echogenicity within normal limits. No mass or hydronephrosis visualized. Abdominal aorta: No aneurysm visualized. Other findings: None. IMPRESSION: 1. Increased echogenicity of the liver consistent with hepatocellular disease and/or fatty infiltration . 2.  No acute or focal abnormality noted. Electronically Signed   By: Maisie Fus  Register   On: 04/04/2016 10:02   Ct Renal Stone Study  Result Date: 04/03/2016 CLINICAL DATA:  Nausea and vomiting since last week. Bilateral flank pain. EXAM: CT ABDOMEN AND PELVIS WITHOUT CONTRAST TECHNIQUE: Multidetector CT imaging of the abdomen and pelvis was performed following the standard protocol without IV contrast. COMPARISON:  Pelvic CT 10/31/2015.  CT abdomen pelvis 02/03/2013. FINDINGS: Lower chest: Mild scarring. No active process. No pleural or pericardial fluid. Hepatobiliary: Normal appearance without contrast. No calcified gallstones. Pancreas: Normal Spleen: Normal Adrenals/Urinary Tract: No adrenal abnormality. No renal abnormality. No cyst, mass, stone or hydronephrosis. Stomach/Bowel: Normal Vascular/Lymphatic: Aorta and IVC are normal. No retroperitoneal mass or lymphadenopathy. Reproductive:  IUD in place. Uterus and an adnexal regions otherwise unremarkable. Other: None. Musculoskeletal: Healing superior and inferior rami fractures on the left. IMPRESSION: No acute or significant finding. No cause of the presenting symptoms is identified. Healing pubic rami fractures on the left. Electronically Signed   By: Paulina Fusi M.D.   On: 04/03/2016 21:34   ASSESSMENT AND PLAN:  40 y.o. female with a history of anxiety, depression, seizures now being admitted with: 1. AK I secondary to dehydration  -cont IV fluid rehydration  -try soft diet  2. Intractable vomiting and abdominal pain, rule out biliary colic. Bilirubins are elevated. T -USG abdomen shows hepatitis likely ETOH related  3. Possible UTI although patient is asymptomatic- -levaquin for 5 days  4. Severe Anxiety, depression and PTSD-continue regular home medications -contributing to palpitations -Psych consult for severe anxiety  5. History of alcohol use disorder-monitor for signs of withdrawal and consider CIWA protocol -pt reports not drinking for last 3 weeks   Case discussed with Care Management/Social Worker. Management plans discussed with the patient, family and they are in agreement.  CODE STATUS: Full  DVT Prophylaxis: lovenox TOTAL TIME TAKING CARE OF THIS PATIENT: 30 minutes.  >50% time spent on counselling and coordination of care  POSSIBLE D/C IN 1-2 DAYS, DEPENDING ON CLINICAL CONDITION.  Note: This dictation was prepared with Dragon dictation along with smaller phrase technology. Any transcriptional errors that result from this process are  unintentional.  Shanedra Lave M.D on 04/04/2016 at 2:44 PM  Between 7am to 6pm - Pager - [phone]  After 6pm go to [email or web address] - password [email or web address]  [facility]  Office  [phone]  CC: Primary care physician; Rosetta Posner, MD

## 2016-04-04 NOTE — Consult Note (Signed)
  Consult received. Came to see patient about 745 this evening after reviewing chart. Patient is quite well known to me from multiple previous psychiatric encounters. Patient was sound asleep when I came to see her. I spoke her name gently and she showed no sign of waking up. There didn't seem to be any need to wake her out of a deep sleep. I will follow-up with her tomorrow. No obvious change to her medication at this point.

## 2016-04-04 NOTE — H&P (Signed)
[facility] - [place] @ [facility] Admission History and Physical Sandy Mason, D.O.  ---------------------------------------------------------------------------------------------------------------------   PATIENT NAME: Sandy Mason MR#: [id number] DATE OF BIRTH: 1975-08-29 DATE OF ADMISSION: 04/03/2016 PRIMARY CARE PHYSICIAN: Rosetta Posner, MD  REQUESTING/REFERRING PHYSICIAN: ED Dr. Huel Cote  CHIEF COMPLAINT: Chief Complaint  Patient presents with  . Nausea  . Emesis  . Anxiety    HISTORY OF PRESENT ILLNESS: Sandy Mason is a 40 y.o. female with a known history of Psychiatric illness, seizures, anxiety was in a usual state of health until This past week when she experienced persistent nausea and vomiting. Patient states that last week she received bad news which cause severe anxiety. She stopped eating and drinking because of her anxiety and depression and subsequently began vomiting. Her vomitus is nonbloody nonbilious. She reports associated bilateral upper quadrant abdominal pain radiating around to the back. She states that for the most part her anxiety and depression have been well controlled on her current medication regimen and she thinks that all of this is related to her anxiety. She denies any fevers or chills or recent illness. She also denies suicidal or homicidal ideation  Otherwise there has been no change in status. Patient has been taking medication as prescribed and there has been no recent change in medication or diet.  There has been no recent illness, travel or sick contacts.    Patient denies fevers/chills, weakness, dizziness, chest pain, shortness of breath, N/V/C/D, abdominal pain, dysuria/frequency, changes in mental status.    PAST MEDICAL HISTORY: Past Medical History:  Diagnosis Date  . Alcohol use disorder severe. 12/18/2014  . Anxiety   . Borderline personality disorder 12/18/2014  . CVA (cerebral infarction)   . Depression   . Major  depressive disorder recurrent severe without psychotic features. 12/18/2014  . Opioid use disorder, severe, dependence (HCC) 12/20/2014  . PTSD (post-traumatic stress disorder) 03/28/2015  . 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 Seizures (HCC)   . Suicide attempt by hanging [facility])       PAST SURGICAL HISTORY: History reviewed. No pertinent surgical history.    SOCIAL HISTORY: Social History  Substance Use Topics  . Smoking status: Current Every Day Smoker    Packs/day: 1.00    Types: Cigarettes  . Smokeless tobacco: Never Used  . Alcohol use Yes   Patient denies illicit drug use.   FAMILY HISTORY: Family History  Problem Relation Age of Onset  . Diabetes Father      MEDICATIONS AT HOME: Prior to Admission medications   Medication Sig Start Date End Date Taking? Authorizing Provider  gabapentin (NEURONTIN) 300 MG capsule Take 3 capsules (900 mg total) by mouth 3 (three) times daily. 02/16/16   Audery Amel, MD  hydrOXYzine (VISTARIL) 25 MG capsule Take 1 capsule (25 mg total) by mouth every 8 (eight) hours as needed for anxiety. 02/16/16   Audery Amel, MD  lidocaine (LIDODERM) 5 % Place 2 patches onto the skin daily. Remove & Discard patch within 12 hours or as directed by MD 12/14/15   Jimmy Footman, MD  meloxicam (MOBIC) 7.5 MG tablet Take 1 tablet (7.5 mg total) by mouth 2 (two) times daily with a meal. 02/16/16   Audery Amel, MD  mirtazapine (REMERON) 15 MG tablet Take 1 tablet (15 mg total) by mouth at bedtime. 02/16/16   Audery Amel, MD  prazosin (MINIPRESS) 2 MG capsule Take 1 capsule (2 mg total) by mouth at bedtime.  02/16/16   Audery AmelJohn T Clapacs, MD  venlafaxine XR (EFFEXOR-XR) 150 MG 24 hr capsule Take 2 capsules (300 mg total) by mouth daily with breakfast. 02/16/16   Audery AmelJohn T Clapacs, MD      DRUG ALLERGIES: Allergies  Allergen Reactions  . Depakote [Valproic Acid] Other (See Comments)    Reaction:   Seizures   . Haldol [Haloperidol Lactate] Other (See Comments)    Reaction:  Seizures   . Keflet [Cephalexin] Rash     REVIEW OF SYSTEMS: CONSTITUTIONAL: No fever/chills, fatigue, weakness, weight gain/loss, headache EYES: No blurry or double vision. ENT: No tinnitus, postnasal drip, redness or soreness of the oropharynx. RESPIRATORY: No cough, wheeze, hemoptysis, dyspnea. CARDIOVASCULAR: No chest pain, orthopnea, palpitations, syncope. GASTROINTESTINAL: Positive nausea, vomiting, abdominal pain, No hematemesis, melena or hematochezia. GENITOURINARY: No dysuria or hematuria. ENDOCRINE: No polyuria or nocturia. No heat or cold intolerance. HEMATOLOGY: No anemia, bruising, bleeding. INTEGUMENTARY: No rashes, ulcers, lesions. MUSCULOSKELETAL: No arthritis, swelling, gout. NEUROLOGIC: No numbness, tingling, weakness or ataxia. No seizure-type activity. PSYCHIATRIC: No anxiety, depression, insomnia.  PHYSICAL EXAMINATION: VITAL SIGNS: Blood pressure 131/81, pulse (!) 115, temperature 97.6 F (36.4 C), temperature source Oral, resp. rate 16, height 5\' 8"  (1.727 m), weight 90.1 kg (198 lb 9.6 oz), SpO2 98 %.  GENERAL: 40 y.o.-year-old female patient, well-developed, well-nourished lying in the bed in no mild distress. Retching. HEENT: Head atraumatic, normocephalic. Pupils equal, round, reactive to light and accommodation. No scleral icterus. Extraocular muscles intact. Nares are patent. Oropharynx is clear. Mucus membranes moist. NECK: Supple, full range of motion. No JVD, no bruit heard. No thyroid enlargement, no tenderness, no cervical lymphadenopathy. CHEST: Normal breath sounds bilaterally. No wheezing, rales, rhonchi or crackles. No use of accessory muscles of respiration.  No reproducible chest wall tenderness.  CARDIOVASCULAR: S1, S2 normal. No murmurs, rubs, or gallops. Cap refill <2 seconds. ABDOMEN: Soft, nontender, nondistended. No rebound, guarding, rigidity. Normoactive bowel  sounds present in all four quadrants. No organomegaly or mass. EXTREMITIES: Full range of motion. No pedal edema, cyanosis, or clubbing. NEUROLOGIC: Cranial nerves II through XII are grossly intact with no focal sensorimotor deficit. Muscle strength 5/5 in all extremities. Sensation intact. Gait not checked. PSYCHIATRIC: The patient is alert and oriented x 3. Normal affect, mood, thought content. SKIN: Warm, dry, and intact without obvious rash, lesion, or ulcer.  LABORATORY PANEL:  CBC  Recent Labs Lab 04/03/16 1801  WBC 15.5*  HGB 18.7*  HCT 54.6*  PLT 411   ----------------------------------------------------------------------------------------------------------------- Chemistries  Recent Labs Lab 04/03/16 1801 04/03/16 2328  NA 130*  --   K 3.9  --   CL 100*  --   CO2 8*  --   GLUCOSE 166*  --   BUN 18  --   CREATININE 1.98*  --   CALCIUM 10.4*  --   MG  --  1.8  AST 45*  --   ALT 41  --   ALKPHOS 98  --   BILITOT 1.8* 1.5*   ------------------------------------------------------------------------------------------------------------------ Cardiac Enzymes  Recent Labs Lab 04/03/16 1801  TROPONINI <0.03   ------------------------------------------------------------------------------------------------------------------  RADIOLOGY: Dg Chest 2 View  Result Date: 04/03/2016 CLINICAL DATA:  40 year old female with chest pain EXAM: CHEST  2 VIEW COMPARISON:  Chest radiograph dated 12/10/2015 FINDINGS: Two views of the chest do not demonstrate a focal consolidation. There is no pleural effusion or pneumothorax. The cardiac silhouette is within normal limits. There is a exaggerated thoracolumbar kyphosis. No acute osseous pathology. IMPRESSION: No active cardiopulmonary disease.  Electronically Signed   By: Elgie Collard M.D.   On: 04/03/2016 20:16   Ct Renal Stone Study  Result Date: 04/03/2016 CLINICAL DATA:  Nausea and vomiting since last week. Bilateral flank  pain. EXAM: CT ABDOMEN AND PELVIS WITHOUT CONTRAST TECHNIQUE: Multidetector CT imaging of the abdomen and pelvis was performed following the standard protocol without IV contrast. COMPARISON:  Pelvic CT 10/31/2015.  CT abdomen pelvis 02/03/2013. FINDINGS: Lower chest: Mild scarring. No active process. No pleural or pericardial fluid. Hepatobiliary: Normal appearance without contrast. No calcified gallstones. Pancreas: Normal Spleen: Normal Adrenals/Urinary Tract: No adrenal abnormality. No renal abnormality. No cyst, mass, stone or hydronephrosis. Stomach/Bowel: Normal Vascular/Lymphatic: Aorta and IVC are normal. No retroperitoneal mass or lymphadenopathy. Reproductive: IUD in place. Uterus and an adnexal regions otherwise unremarkable. Other: None. Musculoskeletal: Healing superior and inferior rami fractures on the left. IMPRESSION: No acute or significant finding. No cause of the presenting symptoms is identified. Healing pubic rami fractures on the left. Electronically Signed   By: Paulina Fusi M.D.   On: 04/03/2016 21:34    EKG: Sinus tachycardia at 127 bpm with left axis deviation and nonspecific ST and T wave changes.  IMPRESSION AND PLAN:  This is a 40 y.o. female with a history of anxiety, depression, seizures now being admitted with: 1. AK I secondary to dehydration we will admit for IV fluid rehydration and recheck BMP in the a.m. We will hold any nephrotoxic drugs. We'll also check acetaminophen, salicylate, alcohol level and urine drug screen 2. Intractable vomiting and abdominal pain, rule out biliary colic. Bilirubins are elevated. Therefore we will obtain a complete abdominal ultrasound and add on amylase and lipase. Antiemetics and pain control 3. Possible UTI although patient is asymptomatic-patient received Levaquin in the emergency department. We'll continue and consider adjusting therapy as cultures return. 4. Anxiety, depression and PTSD-continue regular home medications 5.  History of alcohol use disorder-monitor for signs of withdrawal and consider CIWA protocol  Diet/Nutrition: Nothing by mouth Fluids: IV normal saline DVT Px: \SCDs and early ambulation Code Status: Full  All the records are reviewed and case discussed with ED provider. Management plans discussed with the patient and/or family who express understanding and agree with plan of care.   TOTAL TIME TAKING CARE OF THIS PATIENT: 60 minutes.   Caralee Morea D.O. on 04/04/2016 at 1:35 AM Between 7am to 6pm - Pager - [phone] After 6pm go to [email or web address] - Social research officer, government [facility] [place] [facility] Office [phone] CC: Primary care physician; Rosetta Posner, MD     Note: This dictation was prepared with Dragon dictation along with smaller phrase technology. Any transcriptional errors that result from this process are unintentional.

## 2016-04-04 NOTE — Progress Notes (Signed)
Pharmacy Antibiotic Note  Sandy Mason is a 40 y.o. female admitted on 04/03/2016 with UTI.  Pharmacy has been consulted for Levaquin dosing.  Plan: CrCL has improved. Dose has been changed to Levofloxacin 250mg  daily.   Height: 5\' 8"  (172.7 cm) Weight: 198 lb 9.6 oz (90.1 kg) IBW/kg (Calculated) : 63.9  Temp (24hrs), Avg:98.1 F (36.7 C), Min:97.6 F (36.4 C), Max:98.5 F (36.9 C)   Recent Labs Lab 04/03/16 1801 04/03/16 2115 04/04/16 0452  WBC 15.5*  --  15.5*  CREATININE 1.98*  --  1.08*  LATICACIDVEN  --  2.8*  --     Estimated Creatinine Clearance: 82.1 mL/min (by C-G formula based on SCr of 1.08 mg/dL).    Allergies  Allergen Reactions  . Depakote [Valproic Acid] Other (See Comments)    Reaction:  Seizures   . Haldol [Haloperidol Lactate] Other (See Comments)    Reaction:  Seizures   . Keflet [Cephalexin] Rash    Antimicrobials this admission: Levaquin  >>    >>   Dose adjustments this admission:   Microbiology results:  9/5 UCx: pending    9/5 UA: LE(+) NO2(-) WBC TNTC  Thank you for allowing pharmacy to be a part of this patient's care.  Alexismarie Flaim M Balen Woolum 04/04/2016 11:01 AM

## 2016-04-04 NOTE — Progress Notes (Signed)
04/03/2016 2355: Patient was transferred from the ER d/t c/o nausea and vomiting. Patient was A&O X4 and ambulate independently. She denied pain, but endorsed the constant feeling of nausea. Patient was unable to take her scheduled meds d/t actively vomiting. Dr. Emmit PomfretHugelmeyer was notified of patient vomit.   Dr. Emmit PomfretHugelmeyer at patient's bedside. Ativan and Phenergan was administered per order for anxiety and nausea.  Post Phenergan and Ativan administration patient stated her symptom reduced and indicated that she wanted to sleep.   [id number] 04/04/2016: Patient called to the nursing station that she needed help getting up from the floor. She was  Assisted up from a sitting position on the floor. Dr. Tobi BastosPyreddy was notified about patient's situation and concern for patient possibly going through withdrawal. CIWA protocol was initiated by Dr. Tobi BastosPyreddy.Patient was transferred close to the nursing station and cardiac monitor was ordered. Patient was placed on cardiac monitor.  [id number] 04/04/2016: Dr. Tobi BastosPyreddy was notified of patient's being sinus tachy in the 130s, c/o 9/10 chest pain but denied nausea. One time dose of 5mg  metoprolol was administered  per order, morphine and aspirin were given for chest and Q6 troponin was ordered. [id number] 04/04/2016: Patient HR dropped to 110-115, and stated that her pain is 2/10 and it is tolerable.

## 2016-04-04 NOTE — Progress Notes (Signed)
Patient's heart rate remains 110-120's, at rest. Dr. Allena KatzPatel notified. Ordered to start PO metoprolol 12.5mg  BID starting now. Will continue to monitor.

## 2016-04-04 NOTE — Progress Notes (Addendum)
Dr. Allena KatzPatel rounding in room. Aware of heart rate Order for 2.5mg  IV metoprolol once. Soft diet. Having ativan orders adjusted to match up with CIWA schedule. Also instructed to change IV pepcid to PO 20mg  daily.

## 2016-04-05 DIAGNOSIS — F4310 Post-traumatic stress disorder, unspecified: Secondary | ICD-10-CM

## 2016-04-05 LAB — URINE CULTURE: Culture: NO GROWTH

## 2016-04-05 MED ORDER — PROMETHAZINE HCL 12.5 MG PO TABS: 12.5000 mg | ORAL_TABLET | Freq: Four times a day (QID) | ORAL | 0 refills | Status: DC | PRN

## 2016-04-05 MED ORDER — ADULT MULTIVITAMIN W/MINERALS CH: 1.0000 | ORAL_TABLET | Freq: Every day | ORAL | 0 refills | Status: DC

## 2016-04-05 MED ORDER — LEVOFLOXACIN 250 MG PO TABS: 250.0000 mg | ORAL_TABLET | Freq: Every day | ORAL | 0 refills | Status: DC

## 2016-04-05 MED ORDER — METOPROLOL TARTRATE 25 MG PO TABS: 12.5000 mg | ORAL_TABLET | Freq: Two times a day (BID) | ORAL | 0 refills | Status: DC

## 2016-04-05 NOTE — Progress Notes (Signed)
Patient alert and oriented, was discharged from facility and was picked up by family member. Discharged instructions and prescriptions given and explained to patient, verbalized understanding. IV discontinued and tele box off patient. Was wheeled out in no distress.

## 2016-04-05 NOTE — Consult Note (Signed)
[facility] Consult   Reason for Consult:  Patient seen. Chart reviewed. Case reviewed with hospitalist and nursing. Patient with long-standing mental health problems. Concerns about her anxiety. Referring Physician:  Posey Pronto Patient Identification: Sandy Mason MRN:  [id number] Principal Diagnosis: PTSD (post-traumatic stress disorder) Diagnosis:   Patient Active Problem List   Diagnosis Date Noted  . AKI (acute kidney injury) ([place]) [N17.9] 04/03/2016  . Suicidal behavior [F48.9] 01/16/2016  . Alcohol intoxication ([place]) [F10.129]   . Severe episode of recurrent major depressive disorder, without psychotic features ([place]) [F33.2]   . Malingering [Z76.5] 12/16/2015  . Major depressive disorder, recurrent episode, moderate ([place]) [F33.1] 12/12/2015  . Closed pelvic fracture ([place]) [S32.9XXA] 10/31/2015  . Tobacco abuse [Z72.0] 10/31/2015  . Tobacco use disorder [F17.200] 03/28/2015  . PTSD (post-traumatic stress disorder) [F43.10] 03/28/2015  . Stimulant use disorder (cocaine) [F15.90] 03/28/2015  . Sedative, hypnotic or anxiolytic use disorder, severe, dependence ([place]) [F13.20] 03/27/2015  . Opioid use disorder, severe, dependence ([place]) [F11.20] 12/20/2014  . Alcohol use disorder severe. [F10.20] 12/18/2014  . Borderline personality disorder [F60.3] 12/18/2014    Total Time spent with patient: 1 hour  Subjective:   Sandy Mason is a 40 y.o. female patient admitted with "I've just been going through a lot".  HPI:  Patient interviewed. Chart reviewed. Patient well known from previous encounters. 40 year old woman with PTSD, borderline personality disorder, polysubstance abuse. Came into the hospital with what appears to be psychogenic nausea and vomiting related to anxiety. She describes multiple new stresses. She is getting evicted from her apartment for what she says are unjust and abusive reasons. Her grandmother died recently. As usual there are multitude of  social problems that confront her. Patient claims that she has been taking her medicine as prescribed has been largely avoiding drinking or abusing drugs and has been working with her peer Chartered loss adjuster. She complains that she started having nausea and vomiting as soon as she found out she was being evicted and she believes that it's related to anxiety. She denies any suicidal thoughts or psychosis.  Social history: Patient has been living in an apartment but says that she is being evicted but plans to go live in a different apartment soon. She feels positive overall about the move although the circumstances upset her.  Medical history: Patient has a history of thoughts of somatic symptoms often related to substance abuse in the past. GI complaints this time.  Substance abuse history: Extensive history of abuse of multiple substances including opiates, antianxiety medicines and alcohol  Past Psychiatric History: Patient has a history of multiple hospitalizations, mood instability, diagnoses of PTSD borderline personality disorder and possible bipolar disorder. Multiple hospitalizations and emergency room visits in the past.  Risk to Self: Is patient at risk for suicide?: No Risk to Others:   Prior Inpatient Therapy:   Prior Outpatient Therapy:    Past Medical History:  Past Medical History:  Diagnosis Date  . Alcohol use disorder severe. 12/18/2014  . Anxiety   . Borderline personality disorder 12/18/2014  . CVA (cerebral infarction)   . Depression   . 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 Opioid use disorder, severe, dependence ([place]) 12/20/2014  . PTSD (post-traumatic stress disorder) 03/28/2015  . Sedative, hypnotic or anxiolytic use disorder, severe, dependence ([place]) 03/27/2015  . Seizures ([place])   . Suicide attempt by hanging [facility])    History reviewed. No pertinent surgical history. Family History:  Family History  Problem  Relation Age  of Onset  . Diabetes Father    Family Psychiatric  History: Family history of anxiety and mood instability Social History:  History  Alcohol Use  . Yes     History  Drug Use  . Types: Benzodiazepines, Hydrocodone    Social History   Social History  . Marital status: Single    Spouse name: N/A  . Number of children: N/A  . Years of education: N/A   Social History Main Topics  . Smoking status: Current Every Day Smoker    Packs/day: 1.00    Types: Cigarettes  . Smokeless tobacco: Never Used  . Alcohol use Yes  . Drug use:     Types: Benzodiazepines, Hydrocodone  . Sexual activity: Yes    Birth control/ protection: IUD   Other Topics Concern  . None   Social History Narrative  . None   Additional Social History:    Allergies:   Allergies  Allergen Reactions  . Depakote [Valproic Acid] Other (See Comments)    Reaction:  Seizures   . Haldol [Haloperidol Lactate] Other (See Comments)    Reaction:  Seizures   . Keflet [Cephalexin] Rash    Labs:  Results for orders placed or performed during the hospital encounter of 04/03/16 (from the past 48 hour(s))  Lipase, blood     Status: None   Collection Time: 04/03/16  6:01 PM  Result Value Ref Range   Lipase 29 11 - 51 U/L  Comprehensive metabolic panel     Status: Abnormal   Collection Time: 04/03/16  6:01 PM  Result Value Ref Range   Sodium 130 (L) 135 - 145 mmol/L    Comment: RESULTS VERIFIED BY REPEAT TESTING SDR   Potassium 3.9 3.5 - 5.1 mmol/L   Chloride 100 (L) 101 - 111 mmol/L   CO2 8 (L) 22 - 32 mmol/L   Glucose, Bld 166 (H) 65 - 99 mg/dL   BUN 18 6 - 20 mg/dL   Creatinine, Ser 1.98 (H) 0.44 - 1.00 mg/dL   Calcium 10.4 (H) 8.9 - 10.3 mg/dL   Total Protein 10.3 (H) 6.5 - 8.1 g/dL   Albumin 5.0 3.5 - 5.0 g/dL   AST 45 (H) 15 - 41 U/L   ALT 41 14 - 54 U/L   Alkaline Phosphatase 98 38 - 126 U/L   Total Bilirubin 1.8 (H) 0.3 - 1.2 mg/dL   GFR calc non Af Amer 31 (L) >60 mL/min   GFR calc Af Amer 36  (L) >60 mL/min    Comment: (NOTE) The eGFR has been calculated using the CKD EPI equation. This calculation has not been validated in all clinical situations. eGFR's persistently <60 mL/min signify possible Chronic Kidney Disease.    Anion gap 22 (H) 5 - 15  CBC     Status: Abnormal   Collection Time: 04/03/16  6:01 PM  Result Value Ref Range   WBC 15.5 (H) 3.6 - 11.0 K/uL   RBC 5.87 (H) 3.80 - 5.20 MIL/uL   Hemoglobin 18.7 (H) 12.0 - 16.0 g/dL   HCT 54.6 (H) 35.0 - 47.0 %   MCV 92.9 80.0 - 100.0 fL   MCH 31.8 26.0 - 34.0 pg   MCHC 34.3 32.0 - 36.0 g/dL   RDW 13.5 11.5 - 14.5 %   Platelets 411 150 - 440 K/uL  Urinalysis complete, with microscopic     Status: Abnormal   Collection Time: 04/03/16  6:01 PM  Result Value Ref Range  Color, Urine AMBER (A) YELLOW   APPearance CLOUDY (A) CLEAR   Glucose, UA NEGATIVE NEGATIVE mg/dL   Bilirubin Urine NEGATIVE NEGATIVE   Ketones, ur 2+ (A) NEGATIVE mg/dL   Specific Gravity, Urine 1.018 1.005 - 1.030   Hgb urine dipstick 2+ (A) NEGATIVE   pH 6.0 5.0 - 8.0   Protein, ur 100 (A) NEGATIVE mg/dL   Nitrite NEGATIVE NEGATIVE   Leukocytes, UA 3+ (A) NEGATIVE   RBC / HPF 6-30 0 - 5 RBC/hpf   WBC, UA TOO NUMEROUS TO COUNT 0 - 5 WBC/hpf   Bacteria, UA NONE SEEN NONE SEEN   Squamous Epithelial / LPF 6-30 (A) NONE SEEN   Mucous PRESENT    Hyaline Casts, UA PRESENT   Urine culture     Status: None   Collection Time: 04/03/16  6:01 PM  Result Value Ref Range   Specimen Description URINE, CLEAN CATCH    Special Requests NONE    Culture NO GROWTH Performed at [facility]     Report Status 04/05/2016 FINAL   Troponin I     Status: None   Collection Time: 04/03/16  6:01 PM  Result Value Ref Range   Troponin I <0.03 <0.03 ng/mL  Pregnancy, urine     Status: None   Collection Time: 04/03/16  6:01 PM  Result Value Ref Range   Preg Test, Ur NEGATIVE NEGATIVE  Acetaminophen level     Status: Abnormal   Collection Time: 04/03/16  6:01  PM  Result Value Ref Range   Acetaminophen (Tylenol), Serum <10 (L) 10 - 30 ug/mL    Comment:        THERAPEUTIC CONCENTRATIONS VARY SIGNIFICANTLY. A RANGE OF 10-30 ug/mL MAY BE AN EFFECTIVE CONCENTRATION FOR MANY PATIENTS. HOWEVER, SOME ARE BEST TREATED AT CONCENTRATIONS OUTSIDE THIS RANGE. ACETAMINOPHEN CONCENTRATIONS >150 ug/mL AT 4 HOURS AFTER INGESTION AND >50 ug/mL AT 12 HOURS AFTER INGESTION ARE OFTEN ASSOCIATED WITH TOXIC REACTIONS.   Salicylate level     Status: None   Collection Time: 04/03/16  6:01 PM  Result Value Ref Range   Salicylate Lvl <0.9 2.8 - 30.0 mg/dL  Urine Drug Screen, Qualitative ([facility] only)     Status: None   Collection Time: 04/03/16  6:01 PM  Result Value Ref Range   Tricyclic, Ur Screen NONE DETECTED NONE DETECTED   Amphetamines, Ur Screen NONE DETECTED NONE DETECTED   MDMA (Ecstasy)Ur Screen NONE DETECTED NONE DETECTED   Cocaine Metabolite,Ur [place] NONE DETECTED NONE DETECTED   Opiate, Ur Screen NONE DETECTED NONE DETECTED   Phencyclidine (PCP) Ur S NONE DETECTED NONE DETECTED   Cannabinoid 50 Ng, Ur [place] NONE DETECTED NONE DETECTED   Barbiturates, Ur Screen NONE DETECTED NONE DETECTED   Benzodiazepine, Ur Scrn NONE DETECTED NONE DETECTED   Methadone Scn, Ur NONE DETECTED NONE DETECTED    Comment: (NOTE) 470  Tricyclics, urine               Cutoff 1000 ng/mL 200  Amphetamines, urine             Cutoff 1000 ng/mL 300  MDMA (Ecstasy), urine           Cutoff 500 ng/mL 400  Cocaine Metabolite, urine       Cutoff 300 ng/mL 500  Opiate, urine                   Cutoff 300 ng/mL 600  Phencyclidine (PCP), urine      Cutoff  25 ng/mL 700  Cannabinoid, urine              Cutoff 50 ng/mL 800  Barbiturates, urine             Cutoff 200 ng/mL 900  Benzodiazepine, urine           Cutoff 200 ng/mL 1000 Methadone, urine                Cutoff 300 ng/mL 1100 1200 The urine drug screen provides only a preliminary, unconfirmed 1300 analytical test result and should  not be used for non-medical 1400 purposes. Clinical consideration and professional judgment should 1500 be applied to any positive drug screen result due to possible 1600 interfering substances. A more specific alternate chemical method 1700 must be used in order to obtain a confirmed analytical result.  1800 Gas chromato graphy / mass spectrometry (GC/MS) is the preferred 1900 confirmatory method.   Ethanol     Status: None   Collection Time: 04/03/16  6:01 PM  Result Value Ref Range   Alcohol, Ethyl (B) <5 <5 mg/dL    Comment:        LOWEST DETECTABLE LIMIT FOR SERUM ALCOHOL IS 5 mg/dL FOR MEDICAL PURPOSES ONLY   Lactic acid, plasma     Status: Abnormal   Collection Time: 04/03/16  9:15 PM  Result Value Ref Range   Lactic Acid, Venous 2.8 (HH) 0.5 - 1.9 mmol/L    Comment: CRITICAL RESULT CALLED TO, READ BACK BY AND VERIFIED WITH RAQUEL DAVID ON 04/03/16 AT 2154 BY TLB   Blood gas, venous     Status: Abnormal   Collection Time: 04/03/16 10:10 PM  Result Value Ref Range   pH, Ven 7.10 (LL) 7.250 - 7.430    Comment: CRITICAL RESULT CALLED TO, READ BACK BY AND VERIFIED WITH: DR.STAFFORD AT 2245 ON 04/03/16 KSL    pCO2, Ven 40 (L) 44.0 - 60.0 mmHg   pO2, Ven <31.0 (LL) 32.0 - 45.0 mmHg    Comment: CRITICAL RESULT CALLED TO, READ BACK BY AND VERIFIED WITH: DR.STAFFORD AT 2245 ON 04/03/16 KSL    Bicarbonate 12.4 (L) 20.0 - 28.0 mmol/L   Acid-base deficit 16.6 (H) 0.0 - 2.0 mmol/L   Patient temperature 37.0    Collection site VENOUS    Sample type VENOUS   Bilirubin, fractionated(tot/dir/indir)     Status: Abnormal   Collection Time: 04/03/16 11:28 PM  Result Value Ref Range   Total Bilirubin 1.5 (H) 0.3 - 1.2 mg/dL   Bilirubin, Direct 0.2 0.1 - 0.5 mg/dL   Indirect Bilirubin 1.3 (H) 0.3 - 0.9 mg/dL  C-reactive protein     Status: Abnormal   Collection Time: 04/03/16 11:28 PM  Result Value Ref Range   CRP 3.9 (H) <1.0 mg/dL    Comment: Performed at [facility]   Magnesium     Status: None   Collection Time: 04/03/16 11:28 PM  Result Value Ref Range   Magnesium 1.8 1.7 - 2.4 mg/dL  Phosphorus     Status: None   Collection Time: 04/03/16 11:28 PM  Result Value Ref Range   Phosphorus 4.6 2.5 - 4.6 mg/dL  CBC     Status: Abnormal   Collection Time: 04/04/16  4:52 AM  Result Value Ref Range   WBC 15.5 (H) 3.6 - 11.0 K/uL   RBC 5.10 3.80 - 5.20 MIL/uL   Hemoglobin 16.7 (H) 12.0 - 16.0 g/dL   HCT 47.3 (H) 35.0 - 47.0 %  MCV 92.9 80.0 - 100.0 fL   MCH 32.8 26.0 - 34.0 pg   MCHC 35.3 32.0 - 36.0 g/dL   RDW 13.5 11.5 - 14.5 %   Platelets 350 150 - 440 K/uL  Comprehensive metabolic panel     Status: Abnormal   Collection Time: 04/04/16  4:52 AM  Result Value Ref Range   Sodium 133 (L) 135 - 145 mmol/L   Potassium 3.6 3.5 - 5.1 mmol/L   Chloride 105 101 - 111 mmol/L   CO2 12 (L) 22 - 32 mmol/L   Glucose, Bld 174 (H) 65 - 99 mg/dL   BUN 16 6 - 20 mg/dL   Creatinine, Ser 1.08 (H) 0.44 - 1.00 mg/dL   Calcium 9.5 8.9 - 10.3 mg/dL   Total Protein 9.2 (H) 6.5 - 8.1 g/dL   Albumin 4.4 3.5 - 5.0 g/dL   AST 29 15 - 41 U/L   ALT 33 14 - 54 U/L   Alkaline Phosphatase 82 38 - 126 U/L   Total Bilirubin 0.9 0.3 - 1.2 mg/dL   GFR calc non Af Amer >60 >60 mL/min   GFR calc Af Amer >60 >60 mL/min    Comment: (NOTE) The eGFR has been calculated using the CKD EPI equation. This calculation has not been validated in all clinical situations. eGFR's persistently <60 mL/min signify possible Chronic Kidney Disease.    Anion gap 16 (H) 5 - 15  CK     Status: None   Collection Time: 04/04/16  4:52 AM  Result Value Ref Range   Total CK 42 38 - 234 U/L  Troponin I     Status: None   Collection Time: 04/04/16  4:52 AM  Result Value Ref Range   Troponin I <0.03 <0.03 ng/mL  Troponin I     Status: None   Collection Time: 04/04/16 10:53 AM  Result Value Ref Range   Troponin I <0.03 <0.03 ng/mL  Troponin I     Status: None   Collection Time: 04/04/16  4:55  PM  Result Value Ref Range   Troponin I <0.03 <0.03 ng/mL    Current Facility-Administered Medications  Medication Dose Route Frequency Provider Last Rate Last Dose  . acetaminophen (TYLENOL) tablet 650 mg  650 mg Oral Q6H PRN Alexis Hugelmeyer, DO       Or  . acetaminophen (TYLENOL) suppository 650 mg  650 mg Rectal Q6H PRN Alexis Hugelmeyer, DO      . famotidine (PEPCID) tablet 20 mg  20 mg Oral Daily Fritzi Mandes, MD   20 mg at 04/05/16 0939  . folic acid (FOLVITE) tablet 1 mg  1 mg Oral Daily Saundra Shelling, MD   1 mg at 04/05/16 0939  . gabapentin (NEURONTIN) capsule 900 mg  900 mg Oral TID Alexis Hugelmeyer, DO   900 mg at 04/05/16 0939  . levofloxacin (LEVAQUIN) tablet 250 mg  250 mg Oral Daily Fritzi Mandes, MD   250 mg at 04/05/16 0939  . LORazepam (ATIVAN) injection 0.25 mg  0.25 mg Intravenous Q6H PRN Alexis Hugelmeyer, DO   0.25 mg at 04/05/16 0938  . metoprolol tartrate (LOPRESSOR) tablet 12.5 mg  12.5 mg Oral BID Fritzi Mandes, MD   12.5 mg at 04/05/16 0939  . mirtazapine (REMERON) tablet 15 mg  15 mg Oral QHS Alexis Hugelmeyer, DO   15 mg at 04/04/16 2148  . multivitamin with minerals tablet 1 tablet  1 tablet Oral Daily Saundra Shelling, MD   1 tablet at  04/05/16 0939  . ondansetron (ZOFRAN) tablet 4 mg  4 mg Oral Q6H PRN Alexis Hugelmeyer, DO   4 mg at 04/05/16 0939   Or  . ondansetron (ZOFRAN) injection 4 mg  4 mg Intravenous Q6H PRN Alexis Hugelmeyer, DO      . prazosin (MINIPRESS) capsule 2 mg  2 mg Oral QHS Alexis Hugelmeyer, DO   2 mg at 04/04/16 2148  . sodium chloride flush (NS) 0.9 % injection 3 mL  3 mL Intravenous Q12H Alexis Hugelmeyer, DO   3 mL at 04/05/16 0940  . thiamine (VITAMIN B-1) tablet 100 mg  100 mg Oral Daily Saundra Shelling, MD   100 mg at 04/05/16 0939  . traMADol (ULTRAM) tablet 50 mg  50 mg Oral Q6H PRN Fritzi Mandes, MD   50 mg at 04/05/16 1027  . venlafaxine XR (EFFEXOR-XR) 24 hr capsule 300 mg  300 mg Oral Q breakfast Alexis Hugelmeyer, DO   300 mg at 04/05/16  1740    Musculoskeletal: Strength & Muscle Tone: within normal limits Gait & Station: normal Patient leans: N/A  Psychiatric Specialty Exam: Physical Exam  Nursing note and vitals reviewed. Constitutional: She appears well-developed and well-nourished.  HENT:  Head: Normocephalic and atraumatic.  Eyes: Conjunctivae are normal. Pupils are equal, round, and reactive to light.  Neck: Normal range of motion.  Cardiovascular: Regular rhythm and normal heart sounds.   Respiratory: Effort normal. No respiratory distress.  GI: Soft.  Musculoskeletal: Normal range of motion.  Neurological: She is alert.  Skin: Skin is warm and dry.  Psychiatric: Her behavior is normal. Judgment and thought content normal. Her mood appears anxious.    Review of Systems  Constitutional: Negative.   HENT: Negative.   Eyes: Negative.   Respiratory: Negative.   Cardiovascular: Negative.   Gastrointestinal: Positive for nausea.  Musculoskeletal: Negative.   Skin: Negative.   Neurological: Negative.   Psychiatric/Behavioral: Negative for depression, hallucinations, memory loss, substance abuse and suicidal ideas. The patient is nervous/anxious. The patient does not have insomnia.     Blood pressure 119/86, pulse 82, temperature 98.2 F (36.8 C), temperature source Oral, resp. rate 18, height '5\' 8"'  (1.727 m), weight 90.1 kg (198 lb 9.6 oz), SpO2 96 %.Body mass index is 30.2 kg/m.  General Appearance: Casual  Eye Contact:  Good  Speech:  Normal Rate  Volume:  Normal  Mood:  Anxious  Affect:  Congruent  Thought Process:  Goal Directed  Orientation:  Full (Time, Place, and Person)  Thought Content:  Logical  Suicidal Thoughts:  No  Homicidal Thoughts:  No  Memory:  Immediate;   Fair Recent;   Fair Remote;   Fair  Judgement:  Fair  Insight:  Fair  Psychomotor Activity:  Decreased  Concentration:  Concentration: Fair  Recall:  [employer] of Knowledge:  Fair  Language:  Fair  Akathisia:  No   Handed:  Right  AIMS (if indicated):     Assets:  Communication Skills Desire for Improvement Financial Resources/Insurance Housing Resilience Social Support  ADL's:  Intact  Cognition:  WNL  Sleep:        Treatment Plan Summary: Medication management and Plan This is a 40 year old woman with chronic mental health problems who follows up at [facility]. She has a doctor, she has medicine prescribed, she has a Psychologist, clinical. She is getting about is much outpatient mental health treatment as would be available in the area. There is no indication to change any psychiatric medicine  for her. Patient is requesting being given Ativan. This is not surprising as the patient has a long history of abuse of multiple drugs with only minimal insight at times into how she misuses prescription drugs. I gently informed her that it would not be a good idea to give her any benzodiazepines. I proposed a small amount of Phenergan to help with anxiety and nausea more specifically. Patient is agreeable. 12.5 mg Phenergan every 6 hours as needed for nausea 30 pills. Case reviewed with hospitalist. No commitment in place. Patient can be discharged to her outpatient treatment.  Disposition: Patient does not meet criteria for psychiatric inpatient admission.  Alethia Berthold, MD 04/05/2016 11:55 AM

## 2016-04-05 NOTE — Progress Notes (Signed)
Pt requesting nausea and anxiety medication. Reporting emesis, when assessed by RN, found mostly clear emesis with small amounts partially digested food, <50cc. See prn CIWA score in flowsheets, prn IV ativan given per pt request. Pt stated she felt she would be able to keep down PO meds, including prn PO zofran, and took them without complication. Will continue to monitor.

## 2016-04-05 NOTE — Progress Notes (Signed)
Patient is followed by [facility] and informed RNCM that she did not need assistance with mental health services. There are no CSW needs at this time. CSW is signing off but is available if a need were to arise.  [place] Modehristina [place] Sarinana, MSW, LCSW, LCAS-A Clinical Social Worker [phone]

## 2016-04-05 NOTE — Discharge Summary (Addendum)
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 Alcohol use disorder severe. 12/18/2014  . Anxiety   . Borderline personality disorder 12/18/2014  . CVA (cerebral infarction)   . Depression   . 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 Opioid use disorder, severe, dependence (HCC) 12/20/2014  . PTSD (post-traumatic stress disorder) 03/28/2015  . 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 Seizures (HCC)   . Suicide attempt by hanging [facility])     HOSPITAL COURSE:   40 y.o.femalewith a history of anxiety, depression, seizuresnow being admitted with: 1. AKI secondary to dehydration  -improved -received IV fluid rehydration  -tolerating soft diet  2. Intractable vomiting and abdominal pain, rule out biliary colic.  -Bilirubins are elevated.  -USG abdomen shows hepatitis likely ETOH related  3. Possible UTI although patient is asymptomatic -levaquin for 5 days  4. Chronic Anxiety, depression and PTSD-continue regular home medications -contributing to palpitations -Psych consult for severe anxiety. Spoke with Dr clapacs who knows this pt who has known h/o drug abise(benzos) and chronic anxiety. -no meds change recommended.  -I will NOT prescribe any Benzodiazepines at d/c  5. History of alcohol use disorder-monitor for signs of withdrawal   -no s/o ETOH WD -pt reports not drinking for last 3 weeks -her symptoms are due to chronic anxiety --added low dose BB for tachycardia. HR improved. Now in the 90's  Will d/c home after seen by dr clapacs today Pt will f/u with Dr Carman Ching (psychiatry)  Addendum: Added phenergan 12.5mg  q6 prn nausea at discharge. Script done. Discussed with patient. CONSULTS OBTAINED:  Treatment Team:  Audery Amel, MD  DRUG ALLERGIES:   Allergies  Allergen Reactions  . Depakote [Valproic Acid] Other (See Comments)    Reaction:  Seizures   . Haldol [Haloperidol Lactate] Other (See Comments)    Reaction:  Seizures   . Keflet [Cephalexin] Rash    DISCHARGE MEDICATIONS:   Current Discharge Medication List    START taking these medications   Details  levofloxacin (LEVAQUIN) 250 MG tablet Take 1 tablet (250 mg total) by mouth daily. Qty: 2 tablet, Refills: 0    metoprolol tartrate (LOPRESSOR) 25 MG tablet Take 0.5 tablets (12.5 mg total) by mouth 2 (two) times daily. Qty: 60 tablet, Refills: 0    Multiple Vitamin (MULTIVITAMIN WITH MINERALS) TABS tablet Take 1 tablet by mouth daily. Qty: 30 tablet, Refills: 0    promethazine (PHENERGAN) 12.5 MG tablet Take 1 tablet (12.5 mg total) by mouth every 6 (six) hours as needed for nausea or vomiting. Qty: 30 tablet, Refills: 0      CONTINUE these medications which have NOT CHANGED   Details  gabapentin (NEURONTIN) 300 MG capsule Take 3 capsules (900 mg total) by mouth 3 (three) times daily. Qty: 270 capsule, Refills: 1    hydrOXYzine (VISTARIL) 25 MG capsule Take 1 capsule (25 mg total) by mouth every 8 (eight) hours as needed for anxiety.  Qty: 60 capsule, Refills: 1    lidocaine (LIDODERM) 5 % Place 2 patches onto the skin daily. Remove & Discard patch within 12 hours or as directed by MD Qty: 60 patch, Refills: 0    meloxicam (MOBIC) 7.5 MG tablet Take 1 tablet (7.5 mg total) by mouth 2 (two) times daily with a meal. Qty: 60 tablet,  Refills: 1    mirtazapine (REMERON) 15 MG tablet Take 1 tablet (15 mg total) by mouth at bedtime. Qty: 30 tablet, Refills: 1    prazosin (MINIPRESS) 2 MG capsule Take 1 capsule (2 mg total) by mouth at bedtime. Qty: 30 capsule, Refills: 1    venlafaxine XR (EFFEXOR-XR) 150 MG 24 hr capsule Take 2 capsules (300 mg total) by mouth daily with breakfast. Qty: 60 capsule, Refills: 1        If you experience worsening of your admission symptoms, develop shortness of breath, life threatening emergency, suicidal or homicidal thoughts you must seek medical attention immediately by calling 911 or calling your MD immediately  if symptoms less severe.  You Must read complete instructions/literature along with all the possible adverse reactions/side effects for all the Medicines you take and that have been prescribed to you. Take any new Medicines after you have completely understood and accept all the possible adverse reactions/side effects.   Please note  You were cared for by a hospitalist during your hospital stay. If you have any questions about your discharge medications or the care you received while you were in the hospital after you are discharged, you can call the unit and asked to speak with the hospitalist on call if the hospitalist that took care of you is not available. Once you are discharged, your primary care physician will handle any further medical issues. Please note that NO REFILLS for any discharge medications will be authorized once you are discharged, as it is imperative that you return to your primary care physician (or establish a relationship with a primary care physician if you do not have one) for your aftercare needs so that they can reassess your need for medications and monitor your lab values. Today   SUBJECTIVE   Can I have ativan at discharge? No more vomting  VITAL SIGNS:  Blood pressure (!) 138/91, pulse 100, temperature 98.3 F (36.8 C), temperature source Oral,  resp. rate 18, height 5\' 8"  (1.727 m), weight 90.1 kg (198 lb 9.6 oz), SpO2 95 %.  I/O:    Intake/Output Summary (Last 24 hours) at 04/05/16 1043 Last data filed at 04/05/16 0945  Gross per 24 hour  Intake             2365 ml  Output              950 ml  Net             1415 ml    PHYSICAL EXAMINATION:  GENERAL:  40 y.o.-year-old patient lying in the bed with no acute distress.  EYES: Pupils equal, round, reactive to light and accommodation. No scleral icterus. Extraocular muscles intact.  HEENT: Head atraumatic, normocephalic. Oropharynx and nasopharynx clear.  NECK:  Supple, no jugular venous distention. No thyroid enlargement, no tenderness.  LUNGS: Normal breath sounds bilaterally, no wheezing, rales,rhonchi or crepitation. No use of accessory muscles of respiration.  CARDIOVASCULAR: S1, S2 normal. No murmurs, rubs, or gallops.  ABDOMEN: Soft, non-tender, non-distended. Bowel sounds present. No organomegaly or mass.  EXTREMITIES: No pedal edema, cyanosis, or clubbing.  NEUROLOGIC: Cranial  nerves II through XII are intact. Muscle strength 5/5 in all extremities. Sensation intact. Gait not checked.  PSYCHIATRIC: The patient is alert and oriented x 3.  SKIN: No obvious rash, lesion, or ulcer.   DATA REVIEW:   CBC   Recent Labs Lab 04/04/16 0452  WBC 15.5*  HGB 16.7*  HCT 47.3*  PLT 350    Chemistries   Recent Labs Lab 04/03/16 2328 04/04/16 0452  NA  --  133*  K  --  3.6  CL  --  105  CO2  --  12*  GLUCOSE  --  174*  BUN  --  16  CREATININE  --  1.08*  CALCIUM  --  9.5  MG 1.8  --   AST  --  29  ALT  --  33  ALKPHOS  --  82  BILITOT 1.5* 0.9    Microbiology Results   Recent Results (from the past 240 hour(s))  Urine culture     Status: None   Collection Time: 04/03/16  6:01 PM  Result Value Ref Range Status   Specimen Description URINE, CLEAN CATCH  Final   Special Requests NONE  Final   Culture NO GROWTH Performed at [facility]   Final    Report Status 04/05/2016 FINAL  Final    RADIOLOGY:  Dg Chest 2 View  Result Date: 04/03/2016 CLINICAL DATA:  40 year old female with chest pain EXAM: CHEST  2 VIEW COMPARISON:  Chest radiograph dated 12/10/2015 FINDINGS: Two views of the chest do not demonstrate a focal consolidation. There is no pleural effusion or pneumothorax. The cardiac silhouette is within normal limits. There is a exaggerated thoracolumbar kyphosis. No acute osseous pathology. IMPRESSION: No active cardiopulmonary disease. Electronically Signed   By: Elgie Collard M.D.   On: 04/03/2016 20:16   Dg Pelvis 1-2 Views  Result Date: 04/04/2016 CLINICAL DATA:  Pt fell in April and hurt her pelvis. Pain on left side still. States she never did PT. Negative preg test. EXAM: PELVIS - 1-2 VIEW COMPARISON:  None. FINDINGS: Healing fracture of the LEFT superior and inferior pubic rami. The callus formation about the fracture sites. Hips are located. No sacral fracture. IUD noted. IMPRESSION: Healing fractures of the LEFT superior and inferior pubic rami. Electronically Signed   By: Genevive Bi M.D.   On: 04/04/2016 10:04   US Abdomen Complete  Result Date: 04/04/2016 CLINICAL DATA:  Abdominal pain. EXAM: ABDOMEN ULTRASOUND COMPLETE COMPARISON:  04/03/2016. FINDINGS: Gallbladder: No gallstones or wall thickening visualized. No sonographic Murphy sign noted by sonographer. Common bile duct: Diameter: 2.1 mm Liver: Increased echogenicity consistent with fatty infiltration and/or hepatocellular disease. No focal hepatic abnormality identified . IVC: No abnormality visualized. Pancreas: Visualized portion unremarkable. Spleen: Size and appearance within normal limits. Right Kidney: Length: 12.2 cm. Echogenicity within normal limits. No mass or hydronephrosis visualized. Left Kidney: Length: 12.1 cm. Echogenicity within normal limits. No mass or hydronephrosis visualized. Abdominal aorta: No aneurysm visualized. Other findings: None.  IMPRESSION: 1. Increased echogenicity of the liver consistent with hepatocellular disease and/or fatty infiltration . 2.  No acute or focal abnormality noted. Electronically Signed   By: Maisie Fus  Register   On: 04/04/2016 10:02   Ct Renal Stone Study  Result Date: 04/03/2016 CLINICAL DATA:  Nausea and vomiting since last week. Bilateral flank pain. EXAM: CT ABDOMEN AND PELVIS WITHOUT CONTRAST TECHNIQUE: Multidetector CT imaging of the abdomen and pelvis was performed following the standard protocol without IV contrast. COMPARISON:  Pelvic CT 10/31/2015.  CT abdomen pelvis 02/03/2013. FINDINGS: Lower chest: Mild scarring. No active process. No pleural or pericardial fluid. Hepatobiliary: Normal appearance without contrast. No calcified gallstones. Pancreas: Normal Spleen: Normal Adrenals/Urinary Tract: No adrenal abnormality. No renal abnormality. No cyst, mass, stone or hydronephrosis. Stomach/Bowel: Normal Vascular/Lymphatic: Aorta and IVC are normal. No retroperitoneal mass or lymphadenopathy. Reproductive: IUD in place. Uterus and an adnexal regions otherwise unremarkable. Other: None. Musculoskeletal: Healing superior and inferior rami fractures on the left. IMPRESSION: No acute or significant finding. No cause of the presenting symptoms is identified. Healing pubic rami fractures on the left. Electronically Signed   By: Paulina FusiMark  Shogry M.D.   On: 04/03/2016 21:34     Management plans discussed with the patient, family and they are in agreement.  CODE STATUS:     Code Status Orders        Start     Ordered   04/03/16 2350  Full code  Continuous     04/03/16 2349    Code Status History    Date Active Date Inactive Code Status Order ID Comments User Context   12/25/2015  8:43 PM 01/09/2016  6:50 PM Full Code [id number]  Jeanmarie PlantJames A McShane, MD ED   12/12/2015 12:56 AM 12/12/2015 12:56 AM Full Code [id number]  Audery AmelJohn T Clapacs, MD Inpatient   12/12/2015 12:56 AM 12/16/2015  7:56 PM Full Code [id number]  Audery AmelJohn T  Clapacs, MD Inpatient   12/10/2015 10:25 AM 12/12/2015 12:56 AM Full Code [id number]  Adrian SaranSital Mody, MD Inpatient   10/31/2015  9:49 PM 11/01/2015  7:15 PM Full Code [id number]  Robley FriesAbdullahi Oseni, MD ED   03/27/2015  1:59 AM 03/29/2015  1:20 PM Full Code [id number]  Beau FannySurya K Challa, MD Inpatient   12/19/2014  6:28 PM 12/23/2014  4:30 PM Full Code [id number]  Audery AmelJohn T Clapacs, MD Inpatient   12/17/2014  9:16 PM 12/19/2014  6:28 PM Full Code [id number]  Katha HammingSnehalatha Konidena, MD Inpatient      TOTAL TIME TAKING CARE OF THIS PATIENT: 40 minutes.    Mordecai RasmussenJohn Clapacs M.D on 04/05/2016 at 10:43 AM  Between 7am to 6pm - Pager - [phone] After 6pm go to [email or web address] - password [email or web address] [facility]  [facility]  Office  [phone]  CC: Primary care physician; Rosetta PosnerOARK, JOSHUA S, MD

## 2016-04-16 ENCOUNTER — Emergency Department
Admission: EM | Admit: 2016-04-16 | Discharge: 2016-04-16 | Disposition: A | Discharge status: Home / Self Care | Payer: Medicaid Other | Attending: Emergency Medicine | Admitting: Emergency Medicine

## 2016-04-16 ENCOUNTER — Emergency Department: Payer: Medicaid Other

## 2016-04-16 DIAGNOSIS — Z79899 Other long term (current) drug therapy: Secondary | ICD-10-CM | POA: Insufficient documentation

## 2016-04-16 DIAGNOSIS — F10120 Alcohol abuse with intoxication, uncomplicated: Secondary | ICD-10-CM | POA: Diagnosis not present

## 2016-04-16 DIAGNOSIS — F10929 Alcohol use, unspecified with intoxication, unspecified: Secondary | ICD-10-CM | POA: Diagnosis present

## 2016-04-16 DIAGNOSIS — F419 Anxiety disorder, unspecified: Secondary | ICD-10-CM

## 2016-04-16 DIAGNOSIS — F331 Major depressive disorder, recurrent, moderate: Secondary | ICD-10-CM | POA: Diagnosis present

## 2016-04-16 DIAGNOSIS — F17210 Nicotine dependence, cigarettes, uncomplicated: Secondary | ICD-10-CM | POA: Insufficient documentation

## 2016-04-16 DIAGNOSIS — F1920 Other psychoactive substance dependence, uncomplicated: Secondary | ICD-10-CM | POA: Insufficient documentation

## 2016-04-16 DIAGNOSIS — F1910 Other psychoactive substance abuse, uncomplicated: Secondary | ICD-10-CM

## 2016-04-16 DIAGNOSIS — F10920 Alcohol use, unspecified with intoxication, uncomplicated: Secondary | ICD-10-CM

## 2016-04-16 DIAGNOSIS — F4310 Post-traumatic stress disorder, unspecified: Secondary | ICD-10-CM | POA: Diagnosis present

## 2016-04-16 DIAGNOSIS — F1590 Other stimulant use, unspecified, uncomplicated: Secondary | ICD-10-CM | POA: Diagnosis present

## 2016-04-16 DIAGNOSIS — F603 Borderline personality disorder: Secondary | ICD-10-CM | POA: Diagnosis present

## 2016-04-16 LAB — CBC WITH DIFFERENTIAL/PLATELET
BASOS ABS: 0.1 10*3/uL (ref 0–0.1)
Basophils Relative: 1 %
EOS ABS: 0 10*3/uL (ref 0–0.7)
EOS PCT: 0 %
HCT: 42.5 % (ref 35.0–47.0)
Hemoglobin: 14.8 g/dL (ref 12.0–16.0)
LYMPHS PCT: 39 %
Lymphs Abs: 3 10*3/uL (ref 1.0–3.6)
MCH: 32.4 pg (ref 26.0–34.0)
MCHC: 34.8 g/dL (ref 32.0–36.0)
MCV: 93.2 fL (ref 80.0–100.0)
Monocytes Absolute: 0.7 10*3/uL (ref 0.2–0.9)
Monocytes Relative: 9 %
NEUTROS PCT: 51 %
Neutro Abs: 3.9 10*3/uL (ref 1.4–6.5)
PLATELETS: 369 10*3/uL (ref 150–440)
RBC: 4.56 MIL/uL (ref 3.80–5.20)
RDW: 13.2 % (ref 11.5–14.5)
WBC: 7.6 10*3/uL (ref 3.6–11.0)

## 2016-04-16 LAB — URINE DRUG SCREEN, QUALITATIVE (ARMC ONLY)
Amphetamines, Ur Screen: NOT DETECTED
BARBITURATES, UR SCREEN: NOT DETECTED
BENZODIAZEPINE, UR SCRN: NOT DETECTED
Cannabinoid 50 Ng, Ur ~~LOC~~: NOT DETECTED
Cocaine Metabolite,Ur ~~LOC~~: POSITIVE — AB
MDMA (Ecstasy)Ur Screen: NOT DETECTED
METHADONE SCREEN, URINE: NOT DETECTED
Opiate, Ur Screen: NOT DETECTED
Phencyclidine (PCP) Ur S: NOT DETECTED
TRICYCLIC, UR SCREEN: NOT DETECTED

## 2016-04-16 LAB — COMPREHENSIVE METABOLIC PANEL
ALT: 38 U/L (ref 14–54)
ANION GAP: 11 (ref 5–15)
AST: 38 U/L (ref 15–41)
Albumin: 3.7 g/dL (ref 3.5–5.0)
Alkaline Phosphatase: 70 U/L (ref 38–126)
BUN: <5 mg/dL — ABNORMAL LOW (ref 6–20)
CHLORIDE: 107 mmol/L (ref 101–111)
CO2: 22 mmol/L (ref 22–32)
CREATININE: 0.61 mg/dL (ref 0.44–1.00)
Calcium: 9.3 mg/dL (ref 8.9–10.3)
Glucose, Bld: 103 mg/dL — ABNORMAL HIGH (ref 65–99)
Potassium: 3.4 mmol/L — ABNORMAL LOW (ref 3.5–5.1)
Sodium: 140 mmol/L (ref 135–145)
Total Bilirubin: 0.2 mg/dL — ABNORMAL LOW (ref 0.3–1.2)
Total Protein: 8.2 g/dL — ABNORMAL HIGH (ref 6.5–8.1)

## 2016-04-16 LAB — ETHANOL: ALCOHOL ETHYL (B): 181 mg/dL — AB (ref <5)

## 2016-04-16 LAB — TROPONIN I: Troponin I: <0.03 ng/mL (ref <0.03)

## 2016-04-16 LAB — SALICYLATE LEVEL: Salicylate Lvl: <4 mg/dL (ref 2.8–30.0)

## 2016-04-16 LAB — ACETAMINOPHEN LEVEL

## 2016-04-16 MED ORDER — HYDROXYZINE HCL 25 MG PO TABS
25.0000 mg | ORAL_TABLET | Freq: Once | ORAL | Status: AC
  Administered 2016-04-16: 25 mg via ORAL
  Filled 2016-04-16: qty 1

## 2016-04-16 MED ORDER — LORAZEPAM 1 MG PO TABS
1.0000 mg | ORAL_TABLET | Freq: Once | ORAL | Status: AC
  Administered 2016-04-16: 1 mg via ORAL
  Filled 2016-04-16: qty 1

## 2016-04-16 MED ORDER — ACETAMINOPHEN 325 MG PO TABS
650.0000 mg | ORAL_TABLET | Freq: Once | ORAL | Status: AC
  Administered 2016-04-16: 650 mg via ORAL
  Filled 2016-04-16: qty 2

## 2016-04-16 NOTE — Consult Note (Signed)
[facility] Consult   Reason for Consult:  Consult for 40 year old woman with history of recurrent mood symptoms and behavior problems came voluntarily to the emergency room last night because of anxiety. Referring Physician:  Eual Fines Patient Identification: Sandy Mason MRN:  [id number] Principal Diagnosis: PTSD (post-traumatic stress disorder) Diagnosis:   Patient Active Problem List   Diagnosis Date Noted  . AKI (acute kidney injury) ([place]) [N17.9] 04/03/2016  . Suicidal behavior [F48.9] 01/16/2016  . Alcohol intoxication ([place]) [F10.129]   . Severe episode of recurrent major depressive disorder, without psychotic features ([place]) [F33.2]   . Malingering [Z76.5] 12/16/2015  . Major depressive disorder, recurrent episode, moderate ([place]) [F33.1] 12/12/2015  . Closed pelvic fracture ([place]) [S32.9XXA] 10/31/2015  . Tobacco abuse [Z72.0] 10/31/2015  . Tobacco use disorder [F17.200] 03/28/2015  . PTSD (post-traumatic stress disorder) [F43.10] 03/28/2015  . Stimulant use disorder (cocaine) [F15.90] 03/28/2015  . Sedative, hypnotic or anxiolytic use disorder, severe, dependence ([place]) [F13.20] 03/27/2015  . Opioid use disorder, severe, dependence ([place]) [F11.20] 12/20/2014  . Alcohol use disorder severe. [F10.20] 12/18/2014  . Borderline personality disorder [F60.3] 12/18/2014    Total Time spent with patient: 1 hour  Subjective:   Sandy Mason is a 40 y.o. female patient admitted with "I had a panic attack".  HPI:  Patient interviewed. Labs reviewed. Chart reviewed. Patient known from multiple prior encounters. 40 year old woman with long-standing mood and behavior problems. She reports that in the last week her stresses have increased. She says that she rented a room to live with someone she didn't know very well and then found out that they were a sex offender. This has triggered her and she is been more panicky and anxious and does not feel comfortable staying there.  Last night she was drinking alcohol and used some cocaine. She went to a party and ended up losing track of everyone else she knew and was wandering around outside in the middle of the night. Called 911 and had her self brought to the hospital. Continues to feel somewhat down and discouraged and hopeless with lots of negative thinking. Didn't try to kill her self and denies any suicidal ideation. Not having any psychotic symptoms. She says that she is compliant with her prescribed medication and has been going to [place] and keeping up with her peers support. She claims to only drinking alcohol about once a week and that okay and use was the first time she has done that in a long time.  Social history: Patient is complaining of being homeless right now because she doesn't feel comfortable going back to the place where she is living and is asking for assistance getting into the women's shelter. History of medical problems mostly directly related to substance abuse.  Medical history: No medical problems really active other than those that have been intermittently related to substance abuse.  Substance abuse history: Long-standing problems with abuse of alcohol and cocaine and occasionally other drugs. No history of DTs.  Past Psychiatric History: Long-standing mental health problems multiple hospitalizations. Chronic mood lability. Borderline features and probably some depressive and bipolar features as well as PTSD. Recently has been staying relatively stable when she stays on her medicine but all of her behavior problems get worse when she is abusing drugs. Does have a history of suicide threats and self injury and some aggression but mostly verbal.  Risk to Self: Is patient at risk for suicide?: Yes Risk to Others:   Prior Inpatient Therapy:  Prior Outpatient Therapy:    Past Medical History:  Past Medical History:  Diagnosis Date  . Alcohol use disorder severe. 12/18/2014  . Anxiety   . Borderline  personality disorder 12/18/2014  . CVA (cerebral infarction)   . Depression   . 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 Opioid use disorder, severe, dependence ([place]) 12/20/2014  . PTSD (post-traumatic stress disorder) 03/28/2015  . Sedative, hypnotic or anxiolytic use disorder, severe, dependence ([place]) 03/27/2015  . Seizures ([place])   . Suicide attempt by hanging ([place])    No past surgical history on file. Family History:  Family History  Problem Relation Age of Onset  . Diabetes Father    Family Psychiatric  History: Family history of anxiety substance abuse Social History:  History  Alcohol Use  . Yes     History  Drug Use  . Types: Benzodiazepines, Hydrocodone    Social History   Social History  . Marital status: Single    Spouse name: N/A  . Number of children: N/A  . Years of education: N/A   Social History Main Topics  . Smoking status: Current Every Day Smoker    Packs/day: 1.00    Types: Cigarettes  . Smokeless tobacco: Never Used  . Alcohol use Yes  . Drug use:     Types: Benzodiazepines, Hydrocodone  . Sexual activity: Yes    Birth control/ protection: IUD   Other Topics Concern  . None   Social History Narrative  . None   Additional Social History:    Allergies:   Allergies  Allergen Reactions  . Depakote [Valproic Acid] Anaphylaxis and Other (See Comments)    Reaction:  Seizures   . Haldol [Haloperidol Lactate] Anaphylaxis and Other (See Comments)    Reaction:  Seizures   . Keflet [Cephalexin] Rash    Labs:  Results for orders placed or performed during the hospital encounter of 04/16/16 (from the past 48 hour(s))  Comprehensive metabolic panel     Status: Abnormal   Collection Time: 04/16/16  3:22 AM  Result Value Ref Range   Sodium 140 135 - 145 mmol/L   Potassium 3.4 (L) 3.5 - 5.1 mmol/L   Chloride 107 101 - 111 mmol/L   CO2 22 22 - 32 mmol/L   Glucose, Bld 103 (H) 65 - 99 mg/dL   BUN <5 (L) 6  - 20 mg/dL   Creatinine, Ser 0.61 0.44 - 1.00 mg/dL   Calcium 9.3 8.9 - 10.3 mg/dL   Total Protein 8.2 (H) 6.5 - 8.1 g/dL   Albumin 3.7 3.5 - 5.0 g/dL   AST 38 15 - 41 U/L   ALT 38 14 - 54 U/L   Alkaline Phosphatase 70 38 - 126 U/L   Total Bilirubin 0.2 (L) 0.3 - 1.2 mg/dL   GFR calc non Af Amer >60 >60 mL/min   GFR calc Af Amer >60 >60 mL/min    Comment: (NOTE) The eGFR has been calculated using the CKD EPI equation. This calculation has not been validated in all clinical situations. eGFR's persistently <60 mL/min signify possible Chronic Kidney Disease.    Anion gap 11 5 - 15  Ethanol     Status: Abnormal   Collection Time: 04/16/16  3:22 AM  Result Value Ref Range   Alcohol, Ethyl (B) 181 (H) <5 mg/dL    Comment:        LOWEST DETECTABLE LIMIT FOR SERUM ALCOHOL IS 5 mg/dL FOR MEDICAL PURPOSES ONLY  CBC with Diff     Status: None   Collection Time: 04/16/16  3:22 AM  Result Value Ref Range   WBC 7.6 3.6 - 11.0 K/uL   RBC 4.56 3.80 - 5.20 MIL/uL   Hemoglobin 14.8 12.0 - 16.0 g/dL   HCT 42.5 35.0 - 47.0 %   MCV 93.2 80.0 - 100.0 fL   MCH 32.4 26.0 - 34.0 pg   MCHC 34.8 32.0 - 36.0 g/dL   RDW 13.2 11.5 - 14.5 %   Platelets 369 150 - 440 K/uL   Neutrophils Relative % 51 %   Neutro Abs 3.9 1.4 - 6.5 K/uL   Lymphocytes Relative 39 %   Lymphs Abs 3.0 1.0 - 3.6 K/uL   Monocytes Relative 9 %   Monocytes Absolute 0.7 0.2 - 0.9 K/uL   Eosinophils Relative 0 %   Eosinophils Absolute 0.0 0 - 0.7 K/uL   Basophils Relative 1 %   Basophils Absolute 0.1 0 - 0.1 K/uL  Acetaminophen level     Status: Abnormal   Collection Time: 04/16/16  3:22 AM  Result Value Ref Range   Acetaminophen (Tylenol), Serum <10 (L) 10 - 30 ug/mL    Comment:        THERAPEUTIC CONCENTRATIONS VARY SIGNIFICANTLY. A RANGE OF 10-30 ug/mL MAY BE AN EFFECTIVE CONCENTRATION FOR MANY PATIENTS. HOWEVER, SOME ARE BEST TREATED AT CONCENTRATIONS OUTSIDE THIS RANGE. ACETAMINOPHEN CONCENTRATIONS >150 ug/mL  AT 4 HOURS AFTER INGESTION AND >50 ug/mL AT 12 HOURS AFTER INGESTION ARE OFTEN ASSOCIATED WITH TOXIC REACTIONS.   Salicylate level     Status: None   Collection Time: 04/16/16  3:22 AM  Result Value Ref Range   Salicylate Lvl <6.3 2.8 - 30.0 mg/dL  Urine Drug Screen, Qualitative ([facility] only)     Status: Abnormal   Collection Time: 04/16/16  3:22 AM  Result Value Ref Range   Tricyclic, Ur Screen NONE DETECTED NONE DETECTED   Amphetamines, Ur Screen NONE DETECTED NONE DETECTED   MDMA (Ecstasy)Ur Screen NONE DETECTED NONE DETECTED   Cocaine Metabolite,Ur [place] POSITIVE (A) NONE DETECTED   Opiate, Ur Screen NONE DETECTED NONE DETECTED   Phencyclidine (PCP) Ur S NONE DETECTED NONE DETECTED   Cannabinoid 50 Ng, Ur [place] NONE DETECTED NONE DETECTED   Barbiturates, Ur Screen NONE DETECTED NONE DETECTED   Benzodiazepine, Ur Scrn NONE DETECTED NONE DETECTED   Methadone Scn, Ur NONE DETECTED NONE DETECTED    Comment: (NOTE) 817  Tricyclics, urine               Cutoff 1000 ng/mL 200  Amphetamines, urine             Cutoff 1000 ng/mL 300  MDMA (Ecstasy), urine           Cutoff 500 ng/mL 400  Cocaine Metabolite, urine       Cutoff 300 ng/mL 500  Opiate, urine                   Cutoff 300 ng/mL 600  Phencyclidine (PCP), urine      Cutoff 25 ng/mL 700  Cannabinoid, urine              Cutoff 50 ng/mL 800  Barbiturates, urine             Cutoff 200 ng/mL 900  Benzodiazepine, urine           Cutoff 200 ng/mL 1000 Methadone, urine  Cutoff 300 ng/mL 1100 1200 The urine drug screen provides only a preliminary, unconfirmed 1300 analytical test result and should not be used for non-medical 1400 purposes. Clinical consideration and professional judgment should 1500 be applied to any positive drug screen result due to possible 1600 interfering substances. A more specific alternate chemical method 1700 must be used in order to obtain a confirmed analytical result.  1800 Gas chromato graphy /  mass spectrometry (GC/MS) is the preferred 1900 confirmatory method.   Troponin I     Status: None   Collection Time: 04/16/16  3:22 AM  Result Value Ref Range   Troponin I <0.03 <0.03 ng/mL    No current facility-administered medications for this encounter.    Current Outpatient Prescriptions  Medication Sig Dispense Refill  . gabapentin (NEURONTIN) 300 MG capsule Take 3 capsules (900 mg total) by mouth 3 (three) times daily. 270 capsule 1  . hydrOXYzine (VISTARIL) 25 MG capsule Take 1 capsule (25 mg total) by mouth every 8 (eight) hours as needed for anxiety. 60 capsule 1  . lidocaine (LIDODERM) 5 % Place 2 patches onto the skin daily. Remove & Discard patch within 12 hours or as directed by MD 60 patch 0  . meloxicam (MOBIC) 7.5 MG tablet Take 1 tablet (7.5 mg total) by mouth 2 (two) times daily with a meal. 60 tablet 1  . metoprolol tartrate (LOPRESSOR) 25 MG tablet Take 0.5 tablets (12.5 mg total) by mouth 2 (two) times daily. (Patient taking differently: Take 12.5 mg by mouth every other day. ) 60 tablet 0  . mirtazapine (REMERON) 15 MG tablet Take 1 tablet (15 mg total) by mouth at bedtime. 30 tablet 1  . prazosin (MINIPRESS) 2 MG capsule Take 1 capsule (2 mg total) by mouth at bedtime. 30 capsule 1  . venlafaxine XR (EFFEXOR-XR) 150 MG 24 hr capsule Take 2 capsules (300 mg total) by mouth daily with breakfast. 60 capsule 1  . levofloxacin (LEVAQUIN) 250 MG tablet Take 1 tablet (250 mg total) by mouth daily. (Patient not taking: Reported on 04/16/2016) 2 tablet 0  . Multiple Vitamin (MULTIVITAMIN WITH MINERALS) TABS tablet Take 1 tablet by mouth daily. (Patient not taking: Reported on 04/16/2016) 30 tablet 0  . promethazine (PHENERGAN) 12.5 MG tablet Take 1 tablet (12.5 mg total) by mouth every 6 (six) hours as needed for nausea or vomiting. (Patient not taking: Reported on 04/16/2016) 30 tablet 0    Musculoskeletal: Strength & Muscle Tone: within normal limits Gait & Station:  normal Patient leans: N/A  Psychiatric Specialty Exam: Physical Exam  Nursing note and vitals reviewed. Constitutional: She appears well-developed and well-nourished.  HENT:  Head: Normocephalic and atraumatic.  Eyes: Conjunctivae are normal. Pupils are equal, round, and reactive to light.  Neck: Normal range of motion.  Cardiovascular: Regular rhythm and normal heart sounds.   Respiratory: Effort normal. No respiratory distress.  GI: Soft.  Musculoskeletal: Normal range of motion.  Neurological: She is alert.  Skin: Skin is warm and dry.  Psychiatric: Her speech is normal and behavior is normal. Thought content normal. Her mood appears anxious. Cognition and memory are normal. She expresses impulsivity.    Review of Systems  Constitutional: Negative.   HENT: Negative.   Eyes: Negative.   Respiratory: Negative.   Cardiovascular: Negative.   Gastrointestinal: Negative.   Musculoskeletal: Negative.   Skin: Negative.   Neurological: Negative.   Psychiatric/Behavioral: Positive for substance abuse. Negative for depression, hallucinations, memory loss and suicidal ideas. The patient  is nervous/anxious and has insomnia.     Blood pressure 124/80, pulse 93, temperature 98 F (36.7 C), temperature source Oral, resp. rate 20, height '5\' 8"'  (1.727 m), weight 89.8 kg (198 lb), SpO2 98 %.Body mass index is 30.11 kg/m.  General Appearance: Fairly Groomed  Eye Contact:  Fair  Speech:  Clear and Coherent  Volume:  Normal  Mood:  Anxious  Affect:  Appropriate  Thought Process:  Goal Directed  Orientation:  Full (Time, Place, and Person)  Thought Content:  Logical  Suicidal Thoughts:  No  Homicidal Thoughts:  No  Memory:  Immediate;   Good Recent;   Fair Remote;   Fair  Judgement:  Intact  Insight:  Fair  Psychomotor Activity:  Normal  Concentration:  Concentration: Fair  Recall:  [employer] of Knowledge:  Fair  Language:  Fair  Akathisia:  No  Handed:  Right  AIMS (if  indicated):     Assets:  Communication Skills Desire for Improvement Physical Health Resilience  ADL's:  Intact  Cognition:  WNL  Sleep:        Treatment Plan Summary: Plan 40 year old woman with chronic mood problems and intermittent substance abuse. On interview today she is not psychotic not agitated and not expressing suicidal thoughts. She is appropriate in her thinking and behavior. Patient has some insight into the inappropriateness of substance use. She does not require inpatient treatment. Supportive counseling completed. No new prescriptions required. Recommend that she can be discharged from the emergency room and will continue to follow-up with [facility] in the community.  Disposition: Patient does not meet criteria for psychiatric inpatient admission. Supportive therapy provided about ongoing stressors.  Alethia Berthold, MD 04/16/2016 1:33 PM

## 2016-04-16 NOTE — ED Notes (Signed)

## 2016-04-16 NOTE — ED Notes (Signed)
BEHAVIORAL HEALTH ROUNDING Patient sleeping: Yes.   Patient alert and oriented: not applicable SLEEPING Behavior appropriate: Yes.  ; If no, describe: SLEEPING Nutrition and fluids offered: No SLEEPING Toileting and hygiene offered: NoSLEEPING Sitter present: not applicable, Q 15 min safety rounds and observation. Law enforcement present: Yes ODS 

## 2016-04-16 NOTE — ED Notes (Signed)
Pt requesting something for anxiety. MD made aware.

## 2016-04-16 NOTE — ED Triage Notes (Signed)
Patient has been speaking with [employer] officer for about hour prior to triaging her. Patient will not elaborate on precipitating events tonight but states "its a lot." Denies injury.
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
## 2016-04-16 NOTE — ED Notes (Signed)
BEHAVIORAL HEALTH ROUNDING  Patient sleeping: No.  Patient alert and oriented: yes  Behavior appropriate: Yes. ; If no, describe:  Nutrition and fluids offered: Yes  Toileting and hygiene offered: Yes  Sitter present: not applicable, Q 15 min safety rounds and observation.  Law enforcement present: Yes ODS  

## 2016-04-16 NOTE — ED Provider Notes (Signed)
-----------------------------------------   4:09 PM on 04/16/2016 -----------------------------------------  Dr. Toni Amendlapacs has evaluated the patient, recommends discharge with [facility] follow-up. DC home.   Gayla DossEryka A Monroe Toure, MD 04/16/16 [phone]

## 2016-08-22 ENCOUNTER — Emergency Department
Admission: EM | Admit: 2016-08-22 | Discharge: 2016-08-22 | Disposition: A | Discharge status: Home / Self Care | Payer: Medicaid Other | Attending: Emergency Medicine | Admitting: Emergency Medicine

## 2016-08-22 ENCOUNTER — Encounter: Payer: Self-pay | Admitting: *Deleted

## 2016-08-22 ENCOUNTER — Emergency Department: Payer: Medicaid Other

## 2016-08-22 DIAGNOSIS — F17210 Nicotine dependence, cigarettes, uncomplicated: Secondary | ICD-10-CM | POA: Insufficient documentation

## 2016-08-22 DIAGNOSIS — R0981 Nasal congestion: Secondary | ICD-10-CM | POA: Insufficient documentation

## 2016-08-22 DIAGNOSIS — R05 Cough: Secondary | ICD-10-CM | POA: Insufficient documentation

## 2016-08-22 DIAGNOSIS — R6889 Other general symptoms and signs: Secondary | ICD-10-CM

## 2016-08-22 DIAGNOSIS — R1030 Lower abdominal pain, unspecified: Secondary | ICD-10-CM | POA: Diagnosis present

## 2016-08-22 LAB — CBC
HCT: 44 % (ref 35.0–47.0)
Hemoglobin: 15.6 g/dL (ref 12.0–16.0)
MCH: 33.3 pg (ref 26.0–34.0)
MCHC: 35.5 g/dL (ref 32.0–36.0)
MCV: 93.7 fL (ref 80.0–100.0)
PLATELETS: 253 10*3/uL (ref 150–440)
RBC: 4.7 MIL/uL (ref 3.80–5.20)
RDW: 12.8 % (ref 11.5–14.5)
WBC: 14.8 10*3/uL — ABNORMAL HIGH (ref 3.6–11.0)

## 2016-08-22 LAB — URINALYSIS, COMPLETE (UACMP) WITH MICROSCOPIC
Bacteria, UA: NONE SEEN
Glucose, UA: NEGATIVE mg/dL
Ketones, ur: 20 mg/dL — AB
Nitrite: NEGATIVE
Protein, ur: 100 mg/dL — AB
SPECIFIC GRAVITY, URINE: 1.027 (ref 1.005–1.030)
pH: 5 (ref 5.0–8.0)

## 2016-08-22 LAB — PREGNANCY, URINE: PREG TEST UR: NEGATIVE

## 2016-08-22 MED ORDER — NITROFURANTOIN MONOHYD MACRO 100 MG PO CAPS: 100.0000 mg | ORAL_CAPSULE | Freq: Two times a day (BID) | ORAL | 0 refills | Status: AC

## 2016-08-22 MED ORDER — KETOROLAC TROMETHAMINE 30 MG/ML IJ SOLN
30.0000 mg | Freq: Once | INTRAMUSCULAR | Status: AC
  Administered 2016-08-22: 30 mg via INTRAVENOUS
  Filled 2016-08-22: qty 1

## 2016-08-22 MED ORDER — SODIUM CHLORIDE 0.9 % IV BOLUS (SEPSIS)
1000.0000 mL | Freq: Once | INTRAVENOUS | Status: AC
  Administered 2016-08-22: 1000 mL via INTRAVENOUS

## 2016-08-22 MED ORDER — ONDANSETRON HCL 4 MG PO TABS: 4.0000 mg | ORAL_TABLET | Freq: Three times a day (TID) | ORAL | 0 refills | Status: DC | PRN

## 2016-08-22 MED ORDER — DICYCLOMINE HCL 20 MG PO TABS: 20.0000 mg | ORAL_TABLET | Freq: Three times a day (TID) | ORAL | 0 refills | Status: DC | PRN

## 2016-08-22 MED ORDER — ONDANSETRON HCL 4 MG/2ML IJ SOLN
4.0000 mg | Freq: Once | INTRAMUSCULAR | Status: AC
  Administered 2016-08-22: 4 mg via INTRAVENOUS
  Filled 2016-08-22: qty 2

## 2016-08-22 MED ORDER — DICYCLOMINE HCL 10 MG/ML IM SOLN
20.0000 mg | Freq: Once | INTRAMUSCULAR | Status: AC
  Administered 2016-08-22: 20 mg via INTRAMUSCULAR
  Filled 2016-08-22: qty 2

## 2016-08-22 NOTE — ED Triage Notes (Addendum)
States body aches, congestion and cough for 5 days, pt also states she has not had a period in 10 years and suddenly began to have vaginal bleeding

## 2016-08-22 NOTE — Discharge Instructions (Signed)
Please seek medical attention for any high fevers, chest pain, shortness of breath, change in behavior, persistent vomiting, bloody stool or any other new or concerning symptoms.  

## 2016-08-22 NOTE — ED Provider Notes (Signed)
[facility] Emergency Department Provider Note   ____________________________________________   I have reviewed the triage vital signs and the nursing notes.   HISTORY  Chief Complaint Cough; Nasal Congestion; Generalized Body Aches; and Vaginal Bleeding   History limited by: Not Limited   HPI Sandy Mason is a 41 y.o. female who presents to the emergency department today with primary concern for generalized body aches. The patient states that she has also had cough, some congestion. This is been going on for the past 3 days. The symptoms have been fairly constant. She has had associated nausea and vomiting has not been able to keep down any over-the-counter medications. In addition the patient states she has had some vaginal bleeding and lower abdominal cramping. She states it is been a number of years since her last period. Has not had any measured fevers but feels like she has had chills.   Past Medical History:  Diagnosis Date  . Alcohol use disorder severe. 12/18/2014  . Anxiety   . Borderline personality disorder 12/18/2014  . CVA (cerebral infarction)   . Depression   . 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 Opioid use disorder, severe, dependence (HCC) 12/20/2014  . PTSD (post-traumatic stress disorder) 03/28/2015  . 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 Seizures (HCC)   . Suicide attempt by hanging [facility])     Patient Active Problem List   Diagnosis Date Noted  . AKI (acute kidney injury) (HCC) 04/03/2016  . Suicidal behavior 01/16/2016  . Alcohol intoxication (HCC)   . Severe episode of recurrent major depressive disorder, without psychotic features (HCC)   . Malingering 12/16/2015  . Major depressive disorder, recurrent episode, moderate (HCC) 12/12/2015  . Closed pelvic fracture (HCC) 10/31/2015  . Tobacco abuse 10/31/2015  . Tobacco use disorder  03/28/2015  . PTSD (post-traumatic stress disorder) 03/28/2015  . Stimulant use disorder (cocaine) 03/28/2015  . 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 Opioid use disorder, severe, dependence (HCC) 12/20/2014  . Alcohol use disorder severe. 12/18/2014  . Borderline personality disorder 12/18/2014    History reviewed. No pertinent surgical history.  Prior to Admission medications   Medication Sig Start Date End Date Taking? Authorizing Provider  gabapentin (NEURONTIN) 300 MG capsule Take 3 capsules (900 mg total) by mouth 3 (three) times daily. 02/16/16   Audery Amel, MD  hydrOXYzine (VISTARIL) 25 MG capsule Take 1 capsule (25 mg total) by mouth every 8 (eight) hours as needed for anxiety. 02/16/16   Audery Amel, MD  levofloxacin (LEVAQUIN) 250 MG tablet Take 1 tablet (250 mg total) by mouth daily. Patient not taking: Reported on 04/16/2016 04/05/16   Enedina Finner, MD  lidocaine (LIDODERM) 5 % Place 2 patches onto the skin daily. Remove & Discard patch within 12 hours or as directed by MD 12/14/15   Jimmy Footman, MD  meloxicam (MOBIC) 7.5 MG tablet Take 1 tablet (7.5 mg total) by mouth 2 (two) times daily with a meal. 02/16/16   Audery Amel, MD  metoprolol tartrate (LOPRESSOR) 25 MG tablet Take 0.5 tablets (12.5 mg total) by mouth 2 (two) times daily. Patient taking differently: Take 12.5 mg by mouth every other day.  04/05/16   Enedina Finner, MD  mirtazapine (REMERON) 15 MG tablet Take 1 tablet (15 mg total) by mouth at bedtime. 02/16/16   Audery Amel, MD  Multiple Vitamin (MULTIVITAMIN WITH MINERALS) TABS  tablet Take 1 tablet by mouth daily. Patient not taking: Reported on 04/16/2016 04/05/16   Enedina Finner, MD  prazosin (MINIPRESS) 2 MG capsule Take 1 capsule (2 mg total) by mouth at bedtime. 02/16/16   Audery Amel, MD  promethazine (PHENERGAN) 12.5 MG tablet Take 1 tablet (12.5 mg total) by mouth every 6 (six) hours as needed for  nausea or vomiting. Patient not taking: Reported on 04/16/2016 04/05/16   Audery Amel, MD  venlafaxine XR (EFFEXOR-XR) 150 MG 24 hr capsule Take 2 capsules (300 mg total) by mouth daily with breakfast. 02/16/16   Audery Amel, MD    Allergies Depakote [valproic acid]; Haldol [haloperidol lactate]; and Keflet [cephalexin]  Family History  Problem Relation Age of Onset  . Diabetes Father     Social History Social History  Substance Use Topics  . Smoking status: Current Every Day Smoker    Packs/day: 1.00    Types: Cigarettes  . Smokeless tobacco: Never Used  . Alcohol use Yes    Review of Systems  Constitutional: Positive for chills. Cardiovascular: Negative for chest pain. Respiratory: Positive for cough. Gastrointestinal: Positive for lower abdominal cramping, vomiting and diarrhea. Genitourinary: Negative for dysuria. Musculoskeletal: Negative for back pain. Skin: Negative for rash. Neurological: Negative for headaches, focal weakness or numbness.  10-point ROS otherwise negative.  ____________________________________________   PHYSICAL EXAM:  VITAL SIGNS: ED Triage Vitals  Enc Vitals Group     BP 08/22/16 1536 115/77     Pulse Rate 08/22/16 1536 (!) 119     Resp 08/22/16 1536 18     Temp 08/22/16 1536 98.5 F (36.9 C)     Temp Source 08/22/16 1536 Oral     SpO2 08/22/16 1536 97 %     Weight 08/22/16 1536 198 lb 9.6 oz (90.1 kg)     Height 08/22/16 1536 5\' 8"  (1.727 m)     Head Circumference --      Peak Flow --      Pain Score 08/22/16 1517 6     Pain Loc --      Pain Edu? --      Excl. in GC? --      Constitutional: Alert and oriented. Well appearing and in no distress. Eyes: Conjunctivae are normal. Normal extraocular movements. ENT   Head: Normocephalic and atraumatic.   Nose: No congestion/rhinnorhea.   Mouth/Throat: Mucous membranes are moist.   Neck: No stridor. Hematological/Lymphatic/Immunilogical: No cervical  lymphadenopathy. Cardiovascular: Normal rate, regular rhythm.  No murmurs, rubs, or gallops.  Respiratory: Normal respiratory effort without tachypnea nor retractions. Breath sounds are clear and equal bilaterally. No wheezes/rales/rhonchi. Gastrointestinal: Soft and non tender. No rebound. No guarding.  Genitourinary: Deferred Musculoskeletal: Normal range of motion in all extremities. No lower extremity edema. Neurologic:  Normal speech and language. No gross focal neurologic deficits are appreciated.  Skin:  Skin is warm, dry and intact. No rash noted. Psychiatric: Mood and affect are normal. Speech and behavior are normal. Patient exhibits appropriate insight and judgment.  ____________________________________________    LABS (pertinent positives/negatives)  Labs Reviewed  CBC - Abnormal; Notable for the following:       Result Value   WBC 14.8 (*)    All other components within normal limits  URINALYSIS, COMPLETE (UACMP) WITH MICROSCOPIC - Abnormal; Notable for the following:    Color, Urine AMBER (*)    APPearance HAZY (*)    Hgb urine dipstick SMALL (*)    Bilirubin Urine SMALL (*)  Ketones, ur 20 (*)    Protein, ur 100 (*)    Leukocytes, UA TRACE (*)    Squamous Epithelial / LPF 6-30 (*)    All other components within normal limits  PREGNANCY, URINE     ____________________________________________   EKG  None  ____________________________________________    RADIOLOGY  CXR IMPRESSION:  No active cardiopulmonary disease. No significant change.      ____________________________________________   PROCEDURES  Procedures  ____________________________________________   INITIAL IMPRESSION / ASSESSMENT AND PLAN / ED COURSE  Pertinent labs & imaging results that were available during my care of the patient were reviewed by me and considered in my medical decision making (see chart for details).  Patient presented to the emergency department today  with multiple complaints. Most of her complaints are likely related to a viral illness. No abdominal tenderness. Patient's urine did have some white blood cells, also had some leukocytes. This point I think while UTI less likely would be worth treating with antibiotics. I discussed this with the patient. Discussed return precautions. Will discharge home with antibiotics.  ____________________________________________   FINAL CLINICAL IMPRESSION(S) / ED DIAGNOSES  Final diagnoses:  Flu-like symptoms     Note: This dictation was prepared with Dragon dictation. Any transcriptional errors that result from this process are unintentional     Phineas SemenGraydon Yelina Sarratt, MD 08/22/16 [id number]

## 2016-08-22 NOTE — ED Notes (Signed)
Pt c/o cough, subjective fever with chills/sweats, cough, and body aches. Reports had the flu earlier in the year but was never tested and now thinks has the flu again.

## 2016-08-23 LAB — URINE CULTURE

## 2017-01-28 ENCOUNTER — Emergency Department
Admission: EM | Admit: 2017-01-28 | Discharge: 2017-01-28 | Disposition: A | Discharge status: Home / Self Care | Payer: Medicaid Other | Attending: Emergency Medicine | Admitting: Emergency Medicine

## 2017-01-28 ENCOUNTER — Encounter: Payer: Self-pay | Admitting: Emergency Medicine

## 2017-01-28 DIAGNOSIS — I639 Cerebral infarction, unspecified: Secondary | ICD-10-CM | POA: Diagnosis not present

## 2017-01-28 DIAGNOSIS — H9319 Tinnitus, unspecified ear: Secondary | ICD-10-CM | POA: Diagnosis not present

## 2017-01-28 DIAGNOSIS — R110 Nausea: Secondary | ICD-10-CM | POA: Insufficient documentation

## 2017-01-28 DIAGNOSIS — R42 Dizziness and giddiness: Secondary | ICD-10-CM | POA: Diagnosis not present

## 2017-01-28 DIAGNOSIS — M2540 Effusion, unspecified joint: Secondary | ICD-10-CM | POA: Diagnosis not present

## 2017-01-28 DIAGNOSIS — F17210 Nicotine dependence, cigarettes, uncomplicated: Secondary | ICD-10-CM | POA: Diagnosis not present

## 2017-01-28 LAB — COMPREHENSIVE METABOLIC PANEL
ALBUMIN: 4.2 g/dL (ref 3.5–5.0)
ALT: 21 U/L (ref 14–54)
AST: 30 U/L (ref 15–41)
Alkaline Phosphatase: 77 U/L (ref 38–126)
Anion gap: 6 (ref 5–15)
BILIRUBIN TOTAL: 0.5 mg/dL (ref 0.3–1.2)
BUN: 17 mg/dL (ref 6–20)
CALCIUM: 9.3 mg/dL (ref 8.9–10.3)
CO2: 26 mmol/L (ref 22–32)
CREATININE: 0.84 mg/dL (ref 0.44–1.00)
Chloride: 103 mmol/L (ref 101–111)
GFR calc Af Amer: >60 mL/min (ref >60)
GLUCOSE: 105 mg/dL — AB (ref 65–99)
POTASSIUM: 3.1 mmol/L — AB (ref 3.5–5.1)
Sodium: 135 mmol/L (ref 135–145)
TOTAL PROTEIN: 8.7 g/dL — AB (ref 6.5–8.1)

## 2017-01-28 LAB — CBC WITH DIFFERENTIAL/PLATELET
BASOS ABS: 0.1 10*3/uL (ref 0–0.1)
Basophils Relative: 1 %
Eosinophils Absolute: 0 10*3/uL (ref 0–0.7)
Eosinophils Relative: 0 %
HEMATOCRIT: 45.9 % (ref 35.0–47.0)
HEMOGLOBIN: 15.6 g/dL (ref 12.0–16.0)
LYMPHS PCT: 33 %
Lymphs Abs: 2.7 10*3/uL (ref 1.0–3.6)
MCH: 32.5 pg (ref 26.0–34.0)
MCHC: 34 g/dL (ref 32.0–36.0)
MCV: 95.7 fL (ref 80.0–100.0)
MONO ABS: 0.6 10*3/uL (ref 0.2–0.9)
MONOS PCT: 7 %
NEUTROS ABS: 4.9 10*3/uL (ref 1.4–6.5)
NEUTROS PCT: 59 %
Platelets: 309 10*3/uL (ref 150–440)
RBC: 4.8 MIL/uL (ref 3.80–5.20)
RDW: 13.3 % (ref 11.5–14.5)
WBC: 8.2 10*3/uL (ref 3.6–11.0)

## 2017-01-28 LAB — POCT PREGNANCY, URINE: PREG TEST UR: NEGATIVE

## 2017-01-28 LAB — URINALYSIS, COMPLETE (UACMP) WITH MICROSCOPIC
BACTERIA UA: NONE SEEN
BILIRUBIN URINE: NEGATIVE
Glucose, UA: NEGATIVE mg/dL
Hgb urine dipstick: NEGATIVE
Ketones, ur: 5 mg/dL — AB
Nitrite: NEGATIVE
PH: 5 (ref 5.0–8.0)
Protein, ur: 30 mg/dL — AB
SPECIFIC GRAVITY, URINE: 1.031 — AB (ref 1.005–1.030)

## 2017-01-28 LAB — TROPONIN I

## 2017-01-28 MED ORDER — ONDANSETRON 4 MG PO TBDP
4.0000 mg | ORAL_TABLET | Freq: Once | ORAL | Status: AC
  Administered 2017-01-28: 4 mg via ORAL

## 2017-01-28 MED ORDER — ONDANSETRON 4 MG PO TBDP: 4.0000 mg | ORAL_TABLET | Freq: Three times a day (TID) | ORAL | 0 refills | Status: DC | PRN

## 2017-01-28 MED ORDER — ONDANSETRON 4 MG PO TBDP
ORAL_TABLET | ORAL | Status: AC
  Administered 2017-01-28: 4 mg via ORAL
  Filled 2017-01-28: qty 1

## 2017-01-28 MED ORDER — SULFAMETHOXAZOLE-TRIMETHOPRIM 800-160 MG PO TABS: 1.0000 | ORAL_TABLET | Freq: Two times a day (BID) | ORAL | 0 refills | Status: DC

## 2017-01-28 NOTE — ED Provider Notes (Signed)
[facility] Emergency Department Provider Note       Time seen: ----------------------------------------- 5:08 PM on 01/28/2017 -----------------------------------------     I have reviewed the triage vital signs and the nursing notes.   HISTORY   Chief Complaint Dizziness; Joint Swelling; Tinnitus; and Nausea    HPI Sandy Mason is a 41 y.o. female who presents to the ED for bilateral ankle swelling, ear ringing and pain, nausea and vomiting, dizziness and general malaise since starting meloxicam. Patient states symptoms are specifically worse on Saturday. She does feel somewhat anxious, denies any history of alcohol or drug use. She denies fevers, chills or other complaints.   Past Medical History:  Diagnosis Date  . Alcohol use disorder severe. 12/18/2014  . Anxiety   . Borderline personality disorder 12/18/2014  . CVA (cerebral infarction)   . Depression   . 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 Opioid use disorder, severe, dependence (HCC) 12/20/2014  . PTSD (post-traumatic stress disorder) 03/28/2015  . 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 Seizures (HCC)   . Suicide attempt by hanging [facility](HCC)     Patient Active Problem List   Diagnosis Date Noted  . AKI (acute kidney injury) (HCC) 04/03/2016  . Suicidal behavior 01/16/2016  . Alcohol intoxication (HCC)   . Severe episode of recurrent major depressive disorder, without psychotic features (HCC)   . Malingering 12/16/2015  . Major depressive disorder, recurrent episode, moderate (HCC) 12/12/2015  . Closed pelvic fracture (HCC) 10/31/2015  . Tobacco abuse 10/31/2015  . Tobacco use disorder 03/28/2015  . PTSD (post-traumatic stress disorder) 03/28/2015  . Stimulant use disorder (cocaine) 03/28/2015  . 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 Opioid use  disorder, severe, dependence (HCC) 12/20/2014  . Alcohol use disorder severe. 12/18/2014  . Borderline personality disorder 12/18/2014    History reviewed. No pertinent surgical history.  Allergies Depakote [valproic acid]; Haldol [haloperidol lactate]; and Keflet [cephalexin]  Social History Social History  Substance Use Topics  . Smoking status: Current Every Day Smoker    Packs/day: 1.00    Types: Cigarettes  . Smokeless tobacco: Never Used  . Alcohol use Yes    Review of Systems Constitutional: Negative for fever. Eyes: Negative for vision changes ENT:  Negative for congestion, sore throat Cardiovascular: Negative for chest pain. Respiratory: Negative for shortness of breath. Gastrointestinal: Negative for abdominal pain, positive for nausea or vomiting Genitourinary: Negative for dysuria. Musculoskeletal: Negative for back pain. Positive for swelling Skin: Negative for rash. Neurological: Negative for headaches, focal weakness or numbness. Positive for dizziness  All systems negative/normal/unremarkable except as stated in the HPI  ____________________________________________   PHYSICAL EXAM:  VITAL SIGNS: ED Triage Vitals  Enc Vitals Group     BP 01/28/17 1455 (!) 136/104     Pulse Rate 01/28/17 1455 95     Resp 01/28/17 1455 18     Temp --      Temp Source 01/28/17 1455 Oral     SpO2 01/28/17 1455 98 %     Weight 01/28/17 1456 200 lb (90.7 kg)     Height 01/28/17 1456 5\' 8"  (1.727 m)     Head Circumference --      Peak Flow --      Pain Score 01/28/17 1455 0     Pain Loc --      Pain Edu? --      Excl. in  GC? --    Constitutional: Alert and oriented. Well appearing and in no distress. Eyes: Conjunctivae are normal. Normal extraocular movements. ENT   Head: Normocephalic and atraumatic.   Nose: No congestion/rhinnorhea.   Mouth/Throat: Mucous membranes are moist.   Neck: No stridor. Cardiovascular: Normal rate, regular rhythm. No  murmurs, rubs, or gallops. Respiratory: Normal respiratory effort without tachypnea nor retractions. Breath sounds are clear and equal bilaterally. No wheezes/rales/rhonchi. Gastrointestinal: Soft and nontender. Normal bowel sounds Musculoskeletal: Nontender with normal range of motion in extremities. No lower extremity tenderness nor edema. Neurologic:  Normal speech and language. No gross focal neurologic deficits are appreciated.  Skin:  Skin is warm, dry and intact. No rash noted. Psychiatric: Mood and affect are normal. Speech and behavior are normal.  ____________________________________________  EKG: Interpreted by me. Sinus rhythm rate 88 bpm, normal. We'll, normal QRS, normal QT.  ____________________________________________  ED COURSE:  Pertinent labs & imaging results that were available during my care of the patient were reviewed by me and considered in my medical decision making (see chart for details). Patient presents for dizziness, we will assess with labs and imaging as indicated.   Procedures ____________________________________________   LABS (pertinent positives/negatives)  Labs Reviewed  COMPREHENSIVE METABOLIC PANEL - Abnormal; Notable for the following:       Result Value   Potassium 3.1 (*)    Glucose, Bld 105 (*)    Total Protein 8.7 (*)    All other components within normal limits  URINALYSIS, COMPLETE (UACMP) WITH MICROSCOPIC - Abnormal; Notable for the following:    Color, Urine YELLOW (*)    APPearance HAZY (*)    Specific Gravity, Urine 1.031 (*)    Ketones, ur 5 (*)    Protein, ur 30 (*)    Leukocytes, UA LARGE (*)    Squamous Epithelial / LPF 0-5 (*)    All other components within normal limits  CBC WITH DIFFERENTIAL/PLATELET  TROPONIN I  POCT PREGNANCY, URINE  POC URINE PREG, ED   ____________________________________________  FINAL ASSESSMENT AND PLAN  Dizziness  Plan: Patient's labs were dictated above. Patient had presented for  dizziness which is likely multifactorial. She appears mildly dehydrated and encouraged to increase oral intake. She may have a urinary tract infection as well and will be prescribed an antibiotic. I will prescribe nausea medicine and advised her to stop taking Mobic.   Emily Filbert, MD   Note: This note was generated in part or whole with voice recognition software. Voice recognition is usually quite accurate but there are transcription errors that can and very often do occur. I apologize for any typographical errors that were not detected and corrected.     Emily Filbert, MD 01/28/17 1710

## 2017-01-28 NOTE — ED Triage Notes (Signed)
Pt to ed with c/o bilat ankles swelling, ear ringing and pain, nausea and vomiting, dizziness and general malaise since starting meloxicam, specifically worse on Saturday.  Pt states feels a little better today but feels anxious today.

## 2017-02-28 ENCOUNTER — Telehealth: Payer: Self-pay | Admitting: Pharmacy Technician

## 2017-02-28 NOTE — Telephone Encounter (Signed)
Patient failed to provide 2018 proof of income.  No additional medication assistance will be provided by [facility] without the required proof of income documentation.  Patient notified by letter.  Marnette Perkins J. Avalene Sealy Care Manager [facility] 

## 2017-03-03 DIAGNOSIS — J309 Allergic rhinitis, unspecified: Secondary | ICD-10-CM | POA: Insufficient documentation

## 2017-03-03 DIAGNOSIS — M25552 Pain in left hip: Secondary | ICD-10-CM | POA: Insufficient documentation

## 2017-03-19 ENCOUNTER — Emergency Department
Admission: EM | Admit: 2017-03-19 | Discharge: 2017-03-19 | Disposition: A | Discharge status: Home / Self Care | Payer: Medicaid Other | Attending: Student in an Organized Health Care Education/Training Program | Admitting: Student in an Organized Health Care Education/Training Program

## 2017-03-19 ENCOUNTER — Emergency Department: Payer: Medicaid Other

## 2017-03-19 ENCOUNTER — Encounter: Payer: Self-pay | Admitting: Emergency Medicine

## 2017-03-19 DIAGNOSIS — R0602 Shortness of breath: Secondary | ICD-10-CM | POA: Diagnosis not present

## 2017-03-19 DIAGNOSIS — J40 Bronchitis, not specified as acute or chronic: Secondary | ICD-10-CM | POA: Diagnosis not present

## 2017-03-19 DIAGNOSIS — Z79899 Other long term (current) drug therapy: Secondary | ICD-10-CM | POA: Diagnosis not present

## 2017-03-19 DIAGNOSIS — R079 Chest pain, unspecified: Secondary | ICD-10-CM | POA: Diagnosis not present

## 2017-03-19 DIAGNOSIS — F17210 Nicotine dependence, cigarettes, uncomplicated: Secondary | ICD-10-CM | POA: Diagnosis not present

## 2017-03-19 LAB — CBC
HEMATOCRIT: 36.7 % (ref 35.0–47.0)
Hemoglobin: 12.5 g/dL (ref 12.0–16.0)
MCH: 32.5 pg (ref 26.0–34.0)
MCHC: 33.9 g/dL (ref 32.0–36.0)
MCV: 95.9 fL (ref 80.0–100.0)
Platelets: 267 10*3/uL (ref 150–440)
RBC: 3.83 MIL/uL (ref 3.80–5.20)
RDW: 12.7 % (ref 11.5–14.5)
WBC: 19.6 10*3/uL — ABNORMAL HIGH (ref 3.6–11.0)

## 2017-03-19 LAB — BASIC METABOLIC PANEL
Anion gap: 7 (ref 5–15)
BUN: 10 mg/dL (ref 6–20)
CALCIUM: 8.8 mg/dL — AB (ref 8.9–10.3)
CO2: 22 mmol/L (ref 22–32)
CREATININE: 0.56 mg/dL (ref 0.44–1.00)
Chloride: 109 mmol/L (ref 101–111)
GFR calc Af Amer: >60 mL/min (ref >60)
GLUCOSE: 106 mg/dL — AB (ref 65–99)
Potassium: 3.9 mmol/L (ref 3.5–5.1)
Sodium: 138 mmol/L (ref 135–145)

## 2017-03-19 LAB — TROPONIN I

## 2017-03-19 LAB — POCT PREGNANCY, URINE: PREG TEST UR: NEGATIVE

## 2017-03-19 LAB — FIBRIN DERIVATIVES D-DIMER (ARMC ONLY): FIBRIN DERIVATIVES D-DIMER (ARMC): 297.12 (ref 0.00–499.00)

## 2017-03-19 MED ORDER — ALBUTEROL SULFATE HFA 108 (90 BASE) MCG/ACT IN AERS: 2.0000 | INHALATION_SPRAY | Freq: Four times a day (QID) | RESPIRATORY_TRACT | 2 refills | Status: DC | PRN

## 2017-03-19 MED ORDER — DOXYCYCLINE HYCLATE 50 MG PO CAPS: 100.0000 mg | ORAL_CAPSULE | Freq: Two times a day (BID) | ORAL | 0 refills | Status: AC

## 2017-03-19 MED ORDER — ALBUTEROL SULFATE (2.5 MG/3ML) 0.083% IN NEBU
2.5000 mg | INHALATION_SOLUTION | Freq: Once | RESPIRATORY_TRACT | Status: AC
  Administered 2017-03-19: 2.5 mg via RESPIRATORY_TRACT
  Filled 2017-03-19: qty 3

## 2017-03-19 MED ORDER — DIAZEPAM 5 MG PO TABS: 5.0000 mg | ORAL_TABLET | Freq: Every day | ORAL | 0 refills | Status: DC

## 2017-03-19 MED ORDER — LORAZEPAM 1 MG PO TABS
1.0000 mg | ORAL_TABLET | Freq: Once | ORAL | Status: AC
  Administered 2017-03-19: 1 mg via ORAL
  Filled 2017-03-19: qty 1

## 2017-03-19 NOTE — ED Notes (Signed)
Report from ashley, rn.  

## 2017-03-19 NOTE — ED Provider Notes (Signed)
[facility] Emergency Department Provider Note    First MD Initiated Contact with Patient 03/19/17 1807     (approximate)  I have reviewed the triage vital signs and the nursing notes.   HISTORY  Chief Complaint Panic Attack    HPI Sandy Mason is a 41 y.o. female presents with chief complaint of one day of chest pain is pleuritic in nature shortness of breath. No change in symptoms with exertion. No measured fevers. Does smoke. Does have a history of bronchitis. No history of DVT. History of high blood pressure, high cholesterol or diabetes. Patient states that she initially went [facility] she felt like she was having a panic attack for this and feels a little different from her panic attacks previously. States that she has been out of her Effexor for the past 2 weeks and just restarted it. Denies any SI or HI.   Past Medical History:  Diagnosis Date  . Alcohol use disorder severe. 12/18/2014  . Anxiety   . Borderline personality disorder 12/18/2014  . CVA (cerebral infarction)   . Depression   . 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 Opioid use disorder, severe, dependence (HCC) 12/20/2014  . PTSD (post-traumatic stress disorder) 03/28/2015  . 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 Seizures (HCC)   . Suicide attempt by hanging [facility])    Family History  Problem Relation Age of Onset  . Diabetes Father    History reviewed. No pertinent surgical history. Patient Active Problem List   Diagnosis Date Noted  . AKI (acute kidney injury) (HCC) 04/03/2016  . Suicidal behavior 01/16/2016  . Alcohol intoxication (HCC)   . Severe episode of recurrent major depressive disorder, without psychotic features (HCC)   . Malingering 12/16/2015  . Major depressive disorder, recurrent episode, moderate (HCC) 12/12/2015  . Closed pelvic fracture (HCC) 10/31/2015  . Tobacco abuse  10/31/2015  . Tobacco use disorder 03/28/2015  . PTSD (post-traumatic stress disorder) 03/28/2015  . Stimulant use disorder (cocaine) 03/28/2015  . 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 Opioid use disorder, severe, dependence (HCC) 12/20/2014  . Alcohol use disorder severe. 12/18/2014  . Borderline personality disorder 12/18/2014      Prior to Admission medications   Medication Sig Start Date End Date Taking? Authorizing Provider  albuterol (PROVENTIL HFA;VENTOLIN HFA) 108 (90 Base) MCG/ACT inhaler Inhale 2 puffs into the lungs every 6 (six) hours as needed for wheezing or shortness of breath. 03/19/17   Willy Eddy, MD  diazepam (VALIUM) 5 MG tablet Take 1 tablet (5 mg total) by mouth at bedtime. 03/19/17 03/19/18  Willy Eddy, MD  dicyclomine (BENTYL) 20 MG tablet Take 1 tablet (20 mg total) by mouth 3 (three) times daily as needed (abdominal pain). 08/22/16   Phineas Semen, MD  doxycycline (VIBRAMYCIN) 50 MG capsule Take 2 capsules (100 mg total) by mouth 2 (two) times daily. 03/19/17 03/26/17  Willy Eddy, MD  gabapentin (NEURONTIN) 300 MG capsule Take 3 capsules (900 mg total) by mouth 3 (three) times daily. 02/16/16   Clapacs, Jackquline Denmark, MD  hydrOXYzine (VISTARIL) 25 MG capsule Take 1 capsule (25 mg total) by mouth every 8 (eight) hours as needed for anxiety. 02/16/16   Clapacs, Jackquline Denmark, MD  levofloxacin (LEVAQUIN) 250 MG tablet Take 1 tablet (250 mg total) by mouth daily. Patient not taking: Reported on 04/16/2016 04/05/16   Enedina Finner, MD  lidocaine (LIDODERM)  5 % Place 2 patches onto the skin daily. Remove & Discard patch within 12 hours or as directed by MD 12/14/15   Jimmy Footman, MD  meloxicam (MOBIC) 7.5 MG tablet Take 1 tablet (7.5 mg total) by mouth 2 (two) times daily with a meal. 02/16/16   Clapacs, Jackquline Denmark, MD  metoprolol tartrate (LOPRESSOR) 25 MG tablet Take 0.5 tablets (12.5 mg total) by mouth 2 (two) times  daily. Patient taking differently: Take 12.5 mg by mouth every other day.  04/05/16   Enedina Finner, MD  mirtazapine (REMERON) 15 MG tablet Take 1 tablet (15 mg total) by mouth at bedtime. 02/16/16   Clapacs, Jackquline Denmark, MD  Multiple Vitamin (MULTIVITAMIN WITH MINERALS) TABS tablet Take 1 tablet by mouth daily. Patient not taking: Reported on 04/16/2016 04/05/16   Enedina Finner, MD  ondansetron (ZOFRAN ODT) 4 MG disintegrating tablet Take 1 tablet (4 mg total) by mouth every 8 (eight) hours as needed for nausea or vomiting. 01/28/17   Emily Filbert, MD  ondansetron (ZOFRAN) 4 MG tablet Take 1 tablet (4 mg total) by mouth every 8 (eight) hours as needed for nausea or vomiting. 08/22/16   Phineas Semen, MD  prazosin (MINIPRESS) 2 MG capsule Take 1 capsule (2 mg total) by mouth at bedtime. 02/16/16   Clapacs, Jackquline Denmark, MD  promethazine (PHENERGAN) 12.5 MG tablet Take 1 tablet (12.5 mg total) by mouth every 6 (six) hours as needed for nausea or vomiting. Patient not taking: Reported on 04/16/2016 04/05/16   Clapacs, Jackquline Denmark, MD  sulfamethoxazole-trimethoprim (BACTRIM DS) 800-160 MG tablet Take 1 tablet by mouth 2 (two) times daily. 01/28/17   Emily Filbert, MD  venlafaxine XR (EFFEXOR-XR) 150 MG 24 hr capsule Take 2 capsules (300 mg total) by mouth daily with breakfast. 02/16/16   Clapacs, Jackquline Denmark, MD    Allergies Depakote [valproic acid]; Haldol [haloperidol lactate]; and Keflet [cephalexin]    Social History Social History  Substance Use Topics  . Smoking status: Current Every Day Smoker    Packs/day: 1.00    Types: Cigarettes  . Smokeless tobacco: Never Used  . Alcohol use Yes    Review of Systems Patient denies headaches, rhinorrhea, blurry vision, numbness, shortness of breath, chest pain, edema, cough, abdominal pain, nausea, vomiting, diarrhea, dysuria, fevers, rashes or hallucinations unless otherwise stated above in HPI. ____________________________________________   PHYSICAL  EXAM:  VITAL SIGNS: Vitals:   03/19/17 1930 03/19/17 2000  BP: 111/77 116/84  Pulse: 88 87  Resp:    Temp:    SpO2: 95% 97%    Constitutional: Alert and oriented. Well appearing and in no acute distress. Eyes: Conjunctivae are normal.  Head: Atraumatic. Nose: No congestion/rhinnorhea. Mouth/Throat: Mucous membranes are moist.   Neck: No stridor. Painless ROM.  Cardiovascular: Normal rate, regular rhythm. Grossly normal heart sounds.  Good peripheral circulation. Respiratory: Normal respiratory effort.  No retractions. Lungs with coarse bibasilar breath sounds. Gastrointestinal: Soft and nontender. No distention. No abdominal bruits. No CVA tenderness. Genitourinary:  Musculoskeletal: No lower extremity tenderness nor edema.  No joint effusions. Neurologic:  Normal speech and language. No gross focal neurologic deficits are appreciated. No facial droop Skin:  Skin is warm, dry and intact. No rash noted. Psychiatric  Mildly anxious, organized thought process, no SI or HI ____________________________________________   LABS (all labs ordered are listed, but only abnormal results are displayed)  Results for orders placed or performed during the hospital encounter of 03/19/17 (from the past 24 hour(s))  Basic metabolic panel     Status: Abnormal   Collection Time: 03/19/17  5:29 PM  Result Value Ref Range   Sodium 138 135 - 145 mmol/L   Potassium 3.9 3.5 - 5.1 mmol/L   Chloride 109 101 - 111 mmol/L   CO2 22 22 - 32 mmol/L   Glucose, Bld 106 (H) 65 - 99 mg/dL   BUN 10 6 - 20 mg/dL   Creatinine, Ser 5.62 0.44 - 1.00 mg/dL   Calcium 8.8 (L) 8.9 - 10.3 mg/dL   GFR calc non Af Amer >60 >60 mL/min   GFR calc Af Amer >60 >60 mL/min   Anion gap 7 5 - 15  CBC     Status: Abnormal   Collection Time: 03/19/17  5:29 PM  Result Value Ref Range   WBC 19.6 (H) 3.6 - 11.0 K/uL   RBC 3.83 3.80 - 5.20 MIL/uL   Hemoglobin 12.5 12.0 - 16.0 g/dL   HCT 13.0 86.5 - 78.4 %   MCV 95.9 80.0 -  100.0 fL   MCH 32.5 26.0 - 34.0 pg   MCHC 33.9 32.0 - 36.0 g/dL   RDW 69.6 29.5 - 28.4 %   Platelets 267 150 - 440 K/uL  Troponin I     Status: None   Collection Time: 03/19/17  5:29 PM  Result Value Ref Range   Troponin I <0.03 <0.03 ng/mL  Fibrin derivatives D-Dimer ([facility] only)     Status: None   Collection Time: 03/19/17  6:25 PM  Result Value Ref Range   Fibrin derivatives D-dimer ([facility]) 297.12 0.00 - 499.00  Pregnancy, urine POC     Status: None   Collection Time: 03/19/17  6:38 PM  Result Value Ref Range   Preg Test, Ur NEGATIVE NEGATIVE   ____________________________________________  EKG My review and personal interpretation at Time: 17:34   Indication: chest pain  Rate: 85  Rhythm: sinus Axis: normal  Other: no stemi, no st elevations or depressions ____________________________________________  RADIOLOGY   ____________________________________________   PROCEDURES  Procedure(s) performed:  Procedures    Critical Care performed: no ____________________________________________   INITIAL IMPRESSION / ASSESSMENT AND PLAN / ED COURSE  Pertinent labs & imaging results that were available during my care of the patient were reviewed by me and considered in my medical decision making (see chart for details).  DDX: ACS, pericarditis, esophagitis, boerhaaves, pe, dissection, pna, bronchitis, costochondritis   AARON BOEH is a 41 y.o. who presents to the ED with chief complaint of chest pain.  Patient is AFVSS in ED. Exam as above. Given current presentation have considered the above differential.  EKG shows no evidence of acute ischemia. Troponin is negative after one day of symptoms. Do not feel this clinically consistent with ACS. She is low risk by well's. D-dimer sent to further risk stratify which is negative. Patient is a smoker and based on her symptoms I do feel this is most likely bronchitis. Will start on doxycycline as well as albuterol treatments.  Patient stable for outpatient follow-up.      ____________________________________________   FINAL CLINICAL IMPRESSION(S) / ED DIAGNOSES  Final diagnoses:  Chest pain, unspecified type  Bronchitis      NEW MEDICATIONS STARTED DURING THIS VISIT:  Discharge Medication List as of 03/19/2017  7:15 PM    START taking these medications   Details  albuterol (PROVENTIL HFA;VENTOLIN HFA) 108 (90 Base) MCG/ACT inhaler Inhale 2 puffs into the lungs every 6 (six) hours as needed  for wheezing or shortness of breath., Starting Tue 03/19/2017, Print    doxycycline (VIBRAMYCIN) 50 MG capsule Take 2 capsules (100 mg total) by mouth 2 (two) times daily., Starting Tue 03/19/2017, Until Tue 03/26/2017, Print         Note:  This document was prepared using Dragon voice recognition software and may include unintentional dictation errors.    Willy Eddy, MD 03/19/17 2202

## 2017-03-19 NOTE — ED Triage Notes (Addendum)
Arrives with [facility].  Was at [facility] today with c/o anxiety.  Has recently been out of anxiety medications, effexor.  Patient c/o worsening chest pain today.  Describes pain as pressure.  Patient currently in Group Home.  Working toward getting another job and own apartment.  Also has many other stressors.

## 2017-03-19 NOTE — ED Notes (Signed)
Pt awoken from sleep to administer albuterol. Pt states "i feel so bad, i'm so anxious". Pt informed she will need a ride home after ativan administration. Pt verbalizes understanding.

## 2017-03-19 NOTE — ED Notes (Signed)
Pt asleep, resps unlabored.  

## 2017-04-08 ENCOUNTER — Inpatient Hospital Stay
Admission: EM | Admit: 2017-04-08 | Discharge: 2017-04-11 | DRG: 918 | Disposition: A | Discharge status: Other Acute Inpatient Hospital | Payer: Medicaid Other | Attending: Internal Medicine | Admitting: Internal Medicine

## 2017-04-08 ENCOUNTER — Encounter: Payer: Self-pay | Admitting: Emergency Medicine

## 2017-04-08 DIAGNOSIS — F4310 Post-traumatic stress disorder, unspecified: Secondary | ICD-10-CM | POA: Diagnosis present

## 2017-04-08 DIAGNOSIS — Z818 Family history of other mental and behavioral disorders: Secondary | ICD-10-CM

## 2017-04-08 DIAGNOSIS — Z975 Presence of (intrauterine) contraceptive device: Secondary | ICD-10-CM

## 2017-04-08 DIAGNOSIS — T39091A Poisoning by salicylates, accidental (unintentional), initial encounter: Secondary | ICD-10-CM | POA: Diagnosis present

## 2017-04-08 DIAGNOSIS — H9319 Tinnitus, unspecified ear: Secondary | ICD-10-CM | POA: Diagnosis present

## 2017-04-08 DIAGNOSIS — R45851 Suicidal ideations: Secondary | ICD-10-CM

## 2017-04-08 DIAGNOSIS — F10920 Alcohol use, unspecified with intoxication, uncomplicated: Secondary | ICD-10-CM

## 2017-04-08 DIAGNOSIS — Z791 Long term (current) use of non-steroidal anti-inflammatories (NSAID): Secondary | ICD-10-CM

## 2017-04-08 DIAGNOSIS — Z833 Family history of diabetes mellitus: Secondary | ICD-10-CM

## 2017-04-08 DIAGNOSIS — F332 Major depressive disorder, recurrent severe without psychotic features: Secondary | ICD-10-CM | POA: Diagnosis present

## 2017-04-08 DIAGNOSIS — Y92009 Unspecified place in unspecified non-institutional (private) residence as the place of occurrence of the external cause: Secondary | ICD-10-CM

## 2017-04-08 DIAGNOSIS — F10120 Alcohol abuse with intoxication, uncomplicated: Secondary | ICD-10-CM | POA: Diagnosis present

## 2017-04-08 DIAGNOSIS — T39092A Poisoning by salicylates, intentional self-harm, initial encounter: Principal | ICD-10-CM | POA: Diagnosis present

## 2017-04-08 DIAGNOSIS — T39094A Poisoning by salicylates, undetermined, initial encounter: Secondary | ICD-10-CM

## 2017-04-08 DIAGNOSIS — Z8673 Personal history of transient ischemic attack (TIA), and cerebral infarction without residual deficits: Secondary | ICD-10-CM

## 2017-04-08 DIAGNOSIS — Z915 Personal history of self-harm: Secondary | ICD-10-CM

## 2017-04-08 DIAGNOSIS — F17210 Nicotine dependence, cigarettes, uncomplicated: Secondary | ICD-10-CM | POA: Diagnosis present

## 2017-04-08 DIAGNOSIS — F603 Borderline personality disorder: Secondary | ICD-10-CM | POA: Diagnosis present

## 2017-04-08 DIAGNOSIS — Z888 Allergy status to other drugs, medicaments and biological substances status: Secondary | ICD-10-CM

## 2017-04-08 DIAGNOSIS — F1020 Alcohol dependence, uncomplicated: Secondary | ICD-10-CM | POA: Diagnosis present

## 2017-04-08 DIAGNOSIS — Z79899 Other long term (current) drug therapy: Secondary | ICD-10-CM

## 2017-04-08 DIAGNOSIS — R569 Unspecified convulsions: Secondary | ICD-10-CM | POA: Diagnosis present

## 2017-04-08 NOTE — ED Notes (Signed)
Pt changed out into paper scrubs, all placed into personal bag and labeled with names and MRN.

## 2017-04-08 NOTE — ED Triage Notes (Signed)
Pt arrived to ED via EMS from home. Per EMS pt has been off of mental health medication x5 weeks, today pt has been drinking alcohol since 1100 and tonight pt had SI thoughts. Pt is calm and cooperative with unsteady gait. Pt denies SI and HI currently.

## 2017-04-09 DIAGNOSIS — F332 Major depressive disorder, recurrent severe without psychotic features: Secondary | ICD-10-CM | POA: Diagnosis present

## 2017-04-09 DIAGNOSIS — F10129 Alcohol abuse with intoxication, unspecified: Secondary | ICD-10-CM | POA: Diagnosis not present

## 2017-04-09 DIAGNOSIS — Z888 Allergy status to other drugs, medicaments and biological substances status: Secondary | ICD-10-CM | POA: Diagnosis not present

## 2017-04-09 DIAGNOSIS — Y92009 Unspecified place in unspecified non-institutional (private) residence as the place of occurrence of the external cause: Secondary | ICD-10-CM | POA: Diagnosis not present

## 2017-04-09 DIAGNOSIS — Z818 Family history of other mental and behavioral disorders: Secondary | ICD-10-CM | POA: Diagnosis not present

## 2017-04-09 DIAGNOSIS — Z833 Family history of diabetes mellitus: Secondary | ICD-10-CM | POA: Diagnosis not present

## 2017-04-09 DIAGNOSIS — R569 Unspecified convulsions: Secondary | ICD-10-CM | POA: Diagnosis present

## 2017-04-09 DIAGNOSIS — F10120 Alcohol abuse with intoxication, uncomplicated: Secondary | ICD-10-CM | POA: Diagnosis present

## 2017-04-09 DIAGNOSIS — T39092D Poisoning by salicylates, intentional self-harm, subsequent encounter: Secondary | ICD-10-CM

## 2017-04-09 DIAGNOSIS — Z8673 Personal history of transient ischemic attack (TIA), and cerebral infarction without residual deficits: Secondary | ICD-10-CM | POA: Diagnosis not present

## 2017-04-09 DIAGNOSIS — F17210 Nicotine dependence, cigarettes, uncomplicated: Secondary | ICD-10-CM | POA: Diagnosis present

## 2017-04-09 DIAGNOSIS — Z915 Personal history of self-harm: Secondary | ICD-10-CM | POA: Diagnosis not present

## 2017-04-09 DIAGNOSIS — Z791 Long term (current) use of non-steroidal anti-inflammatories (NSAID): Secondary | ICD-10-CM | POA: Diagnosis not present

## 2017-04-09 DIAGNOSIS — R45851 Suicidal ideations: Secondary | ICD-10-CM | POA: Diagnosis not present

## 2017-04-09 DIAGNOSIS — F4310 Post-traumatic stress disorder, unspecified: Secondary | ICD-10-CM | POA: Diagnosis present

## 2017-04-09 DIAGNOSIS — T39092A Poisoning by salicylates, intentional self-harm, initial encounter: Secondary | ICD-10-CM | POA: Diagnosis not present

## 2017-04-09 DIAGNOSIS — F603 Borderline personality disorder: Secondary | ICD-10-CM | POA: Diagnosis present

## 2017-04-09 DIAGNOSIS — Z79899 Other long term (current) drug therapy: Secondary | ICD-10-CM | POA: Diagnosis not present

## 2017-04-09 DIAGNOSIS — H9319 Tinnitus, unspecified ear: Secondary | ICD-10-CM | POA: Diagnosis present

## 2017-04-09 DIAGNOSIS — Z975 Presence of (intrauterine) contraceptive device: Secondary | ICD-10-CM | POA: Diagnosis not present

## 2017-04-09 DIAGNOSIS — T39091A Poisoning by salicylates, accidental (unintentional), initial encounter: Secondary | ICD-10-CM | POA: Diagnosis present

## 2017-04-09 LAB — URINE DRUG SCREEN, QUALITATIVE (ARMC ONLY)
Amphetamines, Ur Screen: NOT DETECTED
Barbiturates, Ur Screen: NOT DETECTED
Benzodiazepine, Ur Scrn: NOT DETECTED
CANNABINOID 50 NG, UR ~~LOC~~: NOT DETECTED
Cocaine Metabolite,Ur ~~LOC~~: NOT DETECTED
MDMA (ECSTASY) UR SCREEN: NOT DETECTED
Methadone Scn, Ur: NOT DETECTED
Opiate, Ur Screen: NOT DETECTED
PHENCYCLIDINE (PCP) UR S: NOT DETECTED
Tricyclic, Ur Screen: NOT DETECTED

## 2017-04-09 LAB — CBC WITH DIFFERENTIAL/PLATELET
BAND NEUTROPHILS: 0 %
BASOS ABS: 0 10*3/uL (ref 0–0.1)
BLASTS: 0 %
Basophils Relative: 0 %
EOS ABS: 0.2 10*3/uL (ref 0–0.7)
Eosinophils Relative: 3 %
HEMATOCRIT: 34.6 % — AB (ref 35.0–47.0)
Hemoglobin: 12 g/dL (ref 12.0–16.0)
Lymphocytes Relative: 73 %
Lymphs Abs: 4.3 10*3/uL — ABNORMAL HIGH (ref 1.0–3.6)
MCH: 31.9 pg (ref 26.0–34.0)
MCHC: 34.6 g/dL (ref 32.0–36.0)
MCV: 92.2 fL (ref 80.0–100.0)
METAMYELOCYTES PCT: 0 %
MYELOCYTES: 0 %
Monocytes Absolute: 0.2 10*3/uL (ref 0.2–0.9)
Monocytes Relative: 4 %
NEUTROS ABS: 1.2 10*3/uL — AB (ref 1.4–6.5)
Neutrophils Relative %: 20 %
Other: 0 %
PLATELETS: 282 10*3/uL (ref 150–440)
PROMYELOCYTES ABS: 0 %
RBC: 3.75 MIL/uL — AB (ref 3.80–5.20)
RDW: 13.1 % (ref 11.5–14.5)
WBC: 5.9 10*3/uL (ref 3.6–11.0)
nRBC: 0 /100 WBC

## 2017-04-09 LAB — BASIC METABOLIC PANEL
Anion gap: 9 (ref 5–15)
BUN: 11 mg/dL (ref 6–20)
CO2: 23 mmol/L (ref 22–32)
CREATININE: 0.69 mg/dL (ref 0.44–1.00)
Calcium: 8 mg/dL — ABNORMAL LOW (ref 8.9–10.3)
Chloride: 110 mmol/L (ref 101–111)
GFR calc Af Amer: >60 mL/min (ref >60)
GLUCOSE: 97 mg/dL (ref 65–99)
POTASSIUM: 3.1 mmol/L — AB (ref 3.5–5.1)
SODIUM: 142 mmol/L (ref 135–145)

## 2017-04-09 LAB — BLOOD GAS, ARTERIAL
Acid-Base Excess: 2.7 mmol/L — ABNORMAL HIGH (ref 0.0–2.0)
Bicarbonate: 25.9 mmol/L (ref 20.0–28.0)
FIO2: 0.21
O2 Saturation: 93 %
Patient temperature: 37
pCO2 arterial: 34 mmHg (ref 32.0–48.0)
pH, Arterial: 7.49 — ABNORMAL HIGH (ref 7.350–7.450)
pO2, Arterial: 61 mmHg — ABNORMAL LOW (ref 83.0–108.0)

## 2017-04-09 LAB — ETHANOL: ALCOHOL ETHYL (B): 244 mg/dL — AB (ref <5)

## 2017-04-09 LAB — COMPREHENSIVE METABOLIC PANEL
ALBUMIN: 4.1 g/dL (ref 3.5–5.0)
ALK PHOS: 49 U/L (ref 38–126)
ALT: 25 U/L (ref 14–54)
ANION GAP: 10 (ref 5–15)
AST: 31 U/L (ref 15–41)
BUN: 11 mg/dL (ref 6–20)
CHLORIDE: 111 mmol/L (ref 101–111)
CO2: 21 mmol/L — AB (ref 22–32)
Calcium: 8.4 mg/dL — ABNORMAL LOW (ref 8.9–10.3)
Creatinine, Ser: 0.79 mg/dL (ref 0.44–1.00)
GFR calc non Af Amer: >60 mL/min (ref >60)
GLUCOSE: 106 mg/dL — AB (ref 65–99)
Potassium: 3.2 mmol/L — ABNORMAL LOW (ref 3.5–5.1)
SODIUM: 142 mmol/L (ref 135–145)
Total Bilirubin: 0.3 mg/dL (ref 0.3–1.2)
Total Protein: 8 g/dL (ref 6.5–8.1)

## 2017-04-09 LAB — CBC
HCT: 39.1 % (ref 35.0–47.0)
Hemoglobin: 13.7 g/dL (ref 12.0–16.0)
MCH: 32.5 pg (ref 26.0–34.0)
MCHC: 35 g/dL (ref 32.0–36.0)
MCV: 93.1 fL (ref 80.0–100.0)
PLATELETS: 335 10*3/uL (ref 150–440)
RBC: 4.2 MIL/uL (ref 3.80–5.20)
RDW: 13.5 % (ref 11.5–14.5)
WBC: 6.3 10*3/uL (ref 3.6–11.0)

## 2017-04-09 LAB — BLOOD GAS, VENOUS
ACID-BASE DEFICIT: 1.2 mmol/L (ref 0.0–2.0)
Bicarbonate: 22.6 mmol/L (ref 20.0–28.0)
O2 SAT: 93 %
PH VEN: 7.43 (ref 7.250–7.430)
Patient temperature: 37
pCO2, Ven: 34 mmHg — ABNORMAL LOW (ref 44.0–60.0)
pO2, Ven: 65 mmHg — ABNORMAL HIGH (ref 32.0–45.0)

## 2017-04-09 LAB — GLUCOSE, CAPILLARY: Glucose-Capillary: 120 mg/dL — ABNORMAL HIGH (ref 65–99)

## 2017-04-09 LAB — ACETAMINOPHEN LEVEL: Acetaminophen (Tylenol), Serum: <10 ug/mL — ABNORMAL LOW (ref 10–30)

## 2017-04-09 LAB — MRSA PCR SCREENING: MRSA by PCR: NEGATIVE

## 2017-04-09 LAB — SALICYLATE LEVEL
SALICYLATE LVL: 29.3 mg/dL (ref 2.8–30.0)
Salicylate Lvl: 15.8 mg/dL (ref 2.8–30.0)
Salicylate Lvl: 34.4 mg/dL (ref 2.8–30.0)
Salicylate Lvl: 42.2 mg/dL (ref 2.8–30.0)

## 2017-04-09 LAB — HEMOGLOBIN A1C
Hgb A1c MFr Bld: 5.7 % — ABNORMAL HIGH (ref 4.8–5.6)
Mean Plasma Glucose: 116.89 mg/dL

## 2017-04-09 LAB — MAGNESIUM: MAGNESIUM: 2.1 mg/dL (ref 1.7–2.4)

## 2017-04-09 LAB — TSH: TSH: 2.526 u[IU]/mL (ref 0.350–4.500)

## 2017-04-09 MED ORDER — FOLIC ACID 1 MG PO TABS
1.0000 mg | ORAL_TABLET | Freq: Every day | ORAL | Status: DC
  Administered 2017-04-09 – 2017-04-10 (×2): 1 mg via ORAL
  Filled 2017-04-09 (×2): qty 1

## 2017-04-09 MED ORDER — ONDANSETRON HCL 4 MG PO TABS: 4.0000 mg | ORAL_TABLET | Freq: Four times a day (QID) | ORAL | Status: DC | PRN

## 2017-04-09 MED ORDER — POTASSIUM CHLORIDE CRYS ER 20 MEQ PO TBCR: 40.0000 meq | EXTENDED_RELEASE_TABLET | Freq: Once | ORAL | Status: DC

## 2017-04-09 MED ORDER — ENOXAPARIN SODIUM 40 MG/0.4ML ~~LOC~~ SOLN
40.0000 mg | SUBCUTANEOUS | Status: DC
  Administered 2017-04-09 (×2): 40 mg via SUBCUTANEOUS
  Filled 2017-04-09 (×2): qty 0.4

## 2017-04-09 MED ORDER — MIRTAZAPINE 15 MG PO TABS
15.0000 mg | ORAL_TABLET | Freq: Every day | ORAL | Status: DC
  Administered 2017-04-09 – 2017-04-10 (×2): 15 mg via ORAL
  Filled 2017-04-09 (×2): qty 1

## 2017-04-09 MED ORDER — POTASSIUM CHLORIDE CRYS ER 20 MEQ PO TBCR
40.0000 meq | EXTENDED_RELEASE_TABLET | ORAL | Status: AC
  Administered 2017-04-09: 40 meq via ORAL
  Filled 2017-04-09: qty 2

## 2017-04-09 MED ORDER — SODIUM BICARBONATE 8.4 % IV SOLN
INTRAVENOUS | Status: DC
  Administered 2017-04-09: 02:00:00 via INTRAVENOUS
  Filled 2017-04-09 (×4): qty 150

## 2017-04-09 MED ORDER — VITAMIN B-1 100 MG PO TABS
100.0000 mg | ORAL_TABLET | Freq: Every day | ORAL | Status: DC
  Administered 2017-04-09 – 2017-04-11 (×3): 100 mg via ORAL
  Filled 2017-04-09 (×3): qty 1

## 2017-04-09 MED ORDER — DOCUSATE SODIUM 100 MG PO CAPS
100.0000 mg | ORAL_CAPSULE | Freq: Two times a day (BID) | ORAL | Status: DC
  Administered 2017-04-09 – 2017-04-11 (×5): 100 mg via ORAL
  Filled 2017-04-09 (×5): qty 1

## 2017-04-09 MED ORDER — ADULT MULTIVITAMIN W/MINERALS CH
1.0000 | ORAL_TABLET | Freq: Every day | ORAL | Status: DC
  Administered 2017-04-09 – 2017-04-10 (×2): 1 via ORAL
  Filled 2017-04-09 (×2): qty 1

## 2017-04-09 MED ORDER — SODIUM CHLORIDE 0.9 % IV BOLUS (SEPSIS): 1000.0000 mL | Freq: Once | INTRAVENOUS | Status: DC

## 2017-04-09 MED ORDER — ONDANSETRON HCL 4 MG/2ML IJ SOLN
4.0000 mg | Freq: Four times a day (QID) | INTRAMUSCULAR | Status: DC | PRN
  Filled 2017-04-09: qty 2

## 2017-04-09 MED ORDER — GABAPENTIN 300 MG PO CAPS
900.0000 mg | ORAL_CAPSULE | Freq: Three times a day (TID) | ORAL | Status: DC
  Administered 2017-04-09 – 2017-04-11 (×7): 900 mg via ORAL
  Filled 2017-04-09 (×7): qty 3

## 2017-04-09 MED ORDER — FAMOTIDINE 20 MG PO TABS: 20.0000 mg | ORAL_TABLET | Freq: Two times a day (BID) | ORAL | Status: DC

## 2017-04-09 MED ORDER — VENLAFAXINE HCL ER 75 MG PO CP24
300.0000 mg | ORAL_CAPSULE | Freq: Every day | ORAL | Status: DC
  Administered 2017-04-09 – 2017-04-11 (×3): 300 mg via ORAL
  Filled 2017-04-09 (×3): qty 4

## 2017-04-09 MED ORDER — LORAZEPAM 2 MG/ML IJ SOLN
2.0000 mg | INTRAMUSCULAR | Status: DC | PRN
  Administered 2017-04-10 (×2): 2 mg via INTRAVENOUS
  Filled 2017-04-09 (×2): qty 1

## 2017-04-09 MED ORDER — LORAZEPAM 2 MG/ML IJ SOLN: 2.0000 mg | INTRAMUSCULAR | Status: DC | PRN

## 2017-04-09 NOTE — Progress Notes (Signed)
Report given to erwin, RN at this time.  All questions answered.  Preparing patient for transport.  Plan of care to continue.

## 2017-04-09 NOTE — Progress Notes (Signed)
Uneventful day. Patient salicylate level down to normal. Floor care status all day but no floor bed available.

## 2017-04-09 NOTE — H&P (Addendum)
Sandy Mason is an 41 y.o. female.   Chief Complaint: suicidal thoughts HPI: the patient with past medical history of alcohol abuse, major depressive disorder and seizures presents to the emergency department after voicing suicidal thoughts. The patient was found to be inebriated upon arrival. Toxicology also revealed elevated serum salicylate level. With control was contacted and recommended alkalinization of the urine which prompted the emergency department staff to call the hospitalist service for further management.  Past Medical History:  Diagnosis Date  . Alcohol use disorder severe. 12/18/2014  . Anxiety   . Borderline personality disorder 12/18/2014  . CVA (cerebral infarction)   . Depression   . 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 Opioid use disorder, severe, dependence ([place]) 12/20/2014  . PTSD (post-traumatic stress disorder) 03/28/2015  . 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 Seizures ([place])   . Suicide attempt by hanging [facility])     History reviewed. No pertinent surgical history. None  Family History  Problem Relation Age of Onset  . Diabetes Father    Social History:  reports that she has been smoking Cigarettes.  She has been smoking about 1.00 pack per day. She has never used smokeless tobacco. She reports that she drinks alcohol. She reports that she does not use drugs.  Allergies:  Allergies  Allergen Reactions  . Depakote [Valproic Acid] Anaphylaxis and Other (See Comments)    Reaction:  Seizures   . Haldol [Haloperidol Lactate] Anaphylaxis and Other (See Comments)    Reaction:  Seizures   . Keflet [Cephalexin] Rash    Medications Prior to Admission  Medication Sig Dispense Refill  . famotidine (PEPCID) 20 MG tablet Take 20 mg by mouth 2 (two) times daily.    Marland Kitchen gabapentin (NEURONTIN) 300 MG capsule Take 3 capsules (900 mg total) by mouth 3 (three) times daily.  (Patient taking differently: Take 300 mg by mouth 3 (three) times daily. ) 270 capsule 1  . mirtazapine (REMERON) 15 MG tablet Take 1 tablet (15 mg total) by mouth at bedtime. 30 tablet 1  . naproxen (NAPROSYN) 500 MG tablet Take 500 mg by mouth 2 (two) times daily with a meal.    . venlafaxine XR (EFFEXOR-XR) 150 MG 24 hr capsule Take 2 capsules (300 mg total) by mouth daily with breakfast. 60 capsule 1  . albuterol (PROVENTIL HFA;VENTOLIN HFA) 108 (90 Base) MCG/ACT inhaler Inhale 2 puffs into the lungs every 6 (six) hours as needed for wheezing or shortness of breath. 1 Inhaler 2  . diazepam (VALIUM) 5 MG tablet Take 1 tablet (5 mg total) by mouth at bedtime. (Patient not taking: Reported on 04/09/2017) 2 tablet 0  . dicyclomine (BENTYL) 20 MG tablet Take 1 tablet (20 mg total) by mouth 3 (three) times daily as needed (abdominal pain). (Patient not taking: Reported on 04/09/2017) 30 tablet 0  . hydrOXYzine (VISTARIL) 25 MG capsule Take 1 capsule (25 mg total) by mouth every 8 (eight) hours as needed for anxiety. (Patient not taking: Reported on 04/09/2017) 60 capsule 1  . levofloxacin (LEVAQUIN) 250 MG tablet Take 1 tablet (250 mg total) by mouth daily. (Patient not taking: Reported on 04/16/2016) 2 tablet 0  . lidocaine (LIDODERM) 5 % Place 2 patches onto the skin daily. Remove & Discard patch within 12 hours or as directed by MD (Patient not taking: Reported on 04/09/2017) 60 patch 0  . meloxicam (MOBIC) 7.5 MG tablet Take 1 tablet (7.5 mg  total) by mouth 2 (two) times daily with a meal. (Patient not taking: Reported on 04/09/2017) 60 tablet 1  . metoprolol tartrate (LOPRESSOR) 25 MG tablet Take 0.5 tablets (12.5 mg total) by mouth 2 (two) times daily. (Patient not taking: Reported on 04/09/2017) 60 tablet 0  . Multiple Vitamin (MULTIVITAMIN WITH MINERALS) TABS tablet Take 1 tablet by mouth daily. (Patient not taking: Reported on 04/16/2016) 30 tablet 0  . ondansetron (ZOFRAN ODT) 4 MG disintegrating tablet  Take 1 tablet (4 mg total) by mouth every 8 (eight) hours as needed for nausea or vomiting. (Patient not taking: Reported on 04/09/2017) 20 tablet 0  . ondansetron (ZOFRAN) 4 MG tablet Take 1 tablet (4 mg total) by mouth every 8 (eight) hours as needed for nausea or vomiting. (Patient not taking: Reported on 04/09/2017) 20 tablet 0  . prazosin (MINIPRESS) 2 MG capsule Take 1 capsule (2 mg total) by mouth at bedtime. (Patient not taking: Reported on 04/09/2017) 30 capsule 1  . promethazine (PHENERGAN) 12.5 MG tablet Take 1 tablet (12.5 mg total) by mouth every 6 (six) hours as needed for nausea or vomiting. (Patient not taking: Reported on 04/16/2016) 30 tablet 0  . sulfamethoxazole-trimethoprim (BACTRIM DS) 800-160 MG tablet Take 1 tablet by mouth 2 (two) times daily. (Patient not taking: Reported on 04/09/2017) 6 tablet 0    Results for orders placed or performed during the hospital encounter of 04/08/17 (from the past 48 hour(s))  Comprehensive metabolic panel     Status: Abnormal   Collection Time: 04/08/17 11:43 PM  Result Value Ref Range   Sodium 142 135 - 145 mmol/L   Potassium 3.2 (L) 3.5 - 5.1 mmol/L   Chloride 111 101 - 111 mmol/L   CO2 21 (L) 22 - 32 mmol/L   Glucose, Bld 106 (H) 65 - 99 mg/dL   BUN 11 6 - 20 mg/dL   Creatinine, Ser 0.79 0.44 - 1.00 mg/dL   Calcium 8.4 (L) 8.9 - 10.3 mg/dL   Total Protein 8.0 6.5 - 8.1 g/dL   Albumin 4.1 3.5 - 5.0 g/dL   AST 31 15 - 41 U/L   ALT 25 14 - 54 U/L   Alkaline Phosphatase 49 38 - 126 U/L   Total Bilirubin 0.3 0.3 - 1.2 mg/dL   GFR calc non Af Amer >60 >60 mL/min   GFR calc Af Amer >60 >60 mL/min    Comment: (NOTE) The eGFR has been calculated using the CKD EPI equation. This calculation has not been validated in all clinical situations. eGFR's persistently <60 mL/min signify possible Chronic Kidney Disease.    Anion gap 10 5 - 15  Ethanol     Status: Abnormal   Collection Time: 04/08/17 11:43 PM  Result Value Ref Range   Alcohol,  Ethyl (B) 244 (H) <5 mg/dL    Comment:        LOWEST DETECTABLE LIMIT FOR SERUM ALCOHOL IS 5 mg/dL FOR MEDICAL PURPOSES ONLY   Salicylate level     Status: Abnormal   Collection Time: 04/08/17 11:43 PM  Result Value Ref Range   Salicylate Lvl 95.0 (HH) 2.8 - 30.0 mg/dL    Comment: CRITICAL RESULT CALLED TO, READ BACK BY AND VERIFIED WITH VANESSA ASHLEY @ 0042 ON 04/09/2017 BY [facility]   Acetaminophen level     Status: Abnormal   Collection Time: 04/08/17 11:43 PM  Result Value Ref Range   Acetaminophen (Tylenol), Serum <10 (L) 10 - 30 ug/mL    Comment:  THERAPEUTIC CONCENTRATIONS VARY SIGNIFICANTLY. A RANGE OF 10-30 ug/mL MAY BE AN EFFECTIVE CONCENTRATION FOR MANY PATIENTS. HOWEVER, SOME ARE BEST TREATED AT CONCENTRATIONS OUTSIDE THIS RANGE. ACETAMINOPHEN CONCENTRATIONS >150 ug/mL AT 4 HOURS AFTER INGESTION AND >50 ug/mL AT 12 HOURS AFTER INGESTION ARE OFTEN ASSOCIATED WITH TOXIC REACTIONS.   cbc     Status: None   Collection Time: 04/08/17 11:43 PM  Result Value Ref Range   WBC 6.3 3.6 - 11.0 K/uL   RBC 4.20 3.80 - 5.20 MIL/uL   Hemoglobin 13.7 12.0 - 16.0 g/dL   HCT 39.1 35.0 - 47.0 %   MCV 93.1 80.0 - 100.0 fL   MCH 32.5 26.0 - 34.0 pg   MCHC 35.0 32.0 - 36.0 g/dL   RDW 13.5 11.5 - 14.5 %   Platelets 335 150 - 440 K/uL  Urine Drug Screen, Qualitative     Status: None   Collection Time: 04/08/17 11:43 PM  Result Value Ref Range   Tricyclic, Ur Screen NONE DETECTED NONE DETECTED   Amphetamines, Ur Screen NONE DETECTED NONE DETECTED   MDMA (Ecstasy)Ur Screen NONE DETECTED NONE DETECTED   Cocaine Metabolite,Ur [place] NONE DETECTED NONE DETECTED   Opiate, Ur Screen NONE DETECTED NONE DETECTED   Phencyclidine (PCP) Ur S NONE DETECTED NONE DETECTED   Cannabinoid 50 Ng, Ur [place] NONE DETECTED NONE DETECTED   Barbiturates, Ur Screen NONE DETECTED NONE DETECTED   Benzodiazepine, Ur Scrn NONE DETECTED NONE DETECTED   Methadone Scn, Ur NONE DETECTED NONE DETECTED    Comment:  (NOTE) 048  Tricyclics, urine               Cutoff 1000 ng/mL 200  Amphetamines, urine             Cutoff 1000 ng/mL 300  MDMA (Ecstasy), urine           Cutoff 500 ng/mL 400  Cocaine Metabolite, urine       Cutoff 300 ng/mL 500  Opiate, urine                   Cutoff 300 ng/mL 600  Phencyclidine (PCP), urine      Cutoff 25 ng/mL 700  Cannabinoid, urine              Cutoff 50 ng/mL 800  Barbiturates, urine             Cutoff 200 ng/mL 900  Benzodiazepine, urine           Cutoff 200 ng/mL 1000 Methadone, urine                Cutoff 300 ng/mL 1100 1200 The urine drug screen provides only a preliminary, unconfirmed 1300 analytical test result and should not be used for non-medical 1400 purposes. Clinical consideration and professional judgment should 1500 be applied to any positive drug screen result due to possible 1600 interfering substances. A more specific alternate chemical method 1700 must be used in order to obtain a confirmed analytical result.  1800 Gas chromato graphy / mass spectrometry (GC/MS) is the preferred 1900 confirmatory method.   Blood gas, venous     Status: Abnormal   Collection Time: 04/09/17  1:50 AM  Result Value Ref Range   pH, Ven 7.43 7.250 - 7.430   pCO2, Ven 34 (L) 44.0 - 60.0 mmHg   pO2, Ven 65.0 (H) 32.0 - 45.0 mmHg   Bicarbonate 22.6 20.0 - 28.0 mmol/L   Acid-base deficit 1.2 0.0 - 2.0 mmol/L  O2 Saturation 93.0 %   Patient temperature 37.0    Collection site VENOUS    Sample type VENOUS   Salicylate level     Status: Abnormal   Collection Time: 04/09/17  2:01 AM  Result Value Ref Range   Salicylate Lvl 17.5 (HH) 2.8 - 30.0 mg/dL    Comment: CRITICAL RESULT CALLED TO, READ BACK BY AND VERIFIED WITH VANESSA  ASHLEY @ 1025 ON 04/09/2017 BY [facility]F   Glucose, capillary     Status: Abnormal   Collection Time: 04/09/17  3:53 AM  Result Value Ref Range   Glucose-Capillary 120 (H) 65 - 99 mg/dL  TSH     Status: None   Collection Time: 04/09/17  4:40 AM   Result Value Ref Range   TSH 2.526 0.350 - 4.500 uIU/mL    Comment: Performed by a 3rd Generation assay with a functional sensitivity of <=0.01 uIU/mL.  Salicylate level     Status: None   Collection Time: 04/09/17  4:40 AM  Result Value Ref Range   Salicylate Lvl 85.2 2.8 - 30.0 mg/dL  Basic metabolic panel     Status: Abnormal   Collection Time: 04/09/17  4:40 AM  Result Value Ref Range   Sodium 142 135 - 145 mmol/L   Potassium 3.1 (L) 3.5 - 5.1 mmol/L   Chloride 110 101 - 111 mmol/L   CO2 23 22 - 32 mmol/L   Glucose, Bld 97 65 - 99 mg/dL   BUN 11 6 - 20 mg/dL   Creatinine, Ser 0.69 0.44 - 1.00 mg/dL   Calcium 8.0 (L) 8.9 - 10.3 mg/dL   GFR calc non Af Amer >60 >60 mL/min   GFR calc Af Amer >60 >60 mL/min    Comment: (NOTE) The eGFR has been calculated using the CKD EPI equation. This calculation has not been validated in all clinical situations. eGFR's persistently <60 mL/min signify possible Chronic Kidney Disease.    Anion gap 9 5 - 15  CBC with Differential/Platelet     Status: Abnormal   Collection Time: 04/09/17  4:40 AM  Result Value Ref Range   WBC 5.9 3.6 - 11.0 K/uL   RBC 3.75 (L) 3.80 - 5.20 MIL/uL   Hemoglobin 12.0 12.0 - 16.0 g/dL   HCT 34.6 (L) 35.0 - 47.0 %   MCV 92.2 80.0 - 100.0 fL   MCH 31.9 26.0 - 34.0 pg   MCHC 34.6 32.0 - 36.0 g/dL   RDW 13.1 11.5 - 14.5 %   Platelets 282 150 - 440 K/uL   Neutrophils Relative % 20 %   Lymphocytes Relative 73 %   Monocytes Relative 4 %   Eosinophils Relative 3 %   Basophils Relative 0 %   Band Neutrophils 0 %   Metamyelocytes Relative 0 %   Myelocytes 0 %   Promyelocytes Absolute 0 %   Blasts 0 %   nRBC 0 0 /100 WBC   Other 0 %   Neutro Abs 1.2 (L) 1.4 - 6.5 K/uL   Lymphs Abs 4.3 (H) 1.0 - 3.6 K/uL   Monocytes Absolute 0.2 0.2 - 0.9 K/uL   Eosinophils Absolute 0.2 0 - 0.7 K/uL   Basophils Absolute 0.0 0 - 0.1 K/uL   Smear Review MORPHOLOGY UNREMARKABLE   MRSA PCR Screening     Status: None   Collection  Time: 04/09/17  4:41 AM  Result Value Ref Range   MRSA by PCR NEGATIVE NEGATIVE    Comment:  The GeneXpert MRSA Assay (FDA approved for NASAL specimens only), is one component of a comprehensive MRSA colonization surveillance program. It is not intended to diagnose MRSA infection nor to guide or monitor treatment for MRSA infections.   Blood gas, arterial     Status: Abnormal   Collection Time: 04/09/17  5:04 AM  Result Value Ref Range   FIO2 0.21    Delivery systems ROOM AIR    pH, Arterial 7.49 (H) 7.350 - 7.450   pCO2 arterial 34 32.0 - 48.0 mmHg   pO2, Arterial 61 (L) 83.0 - 108.0 mmHg   Bicarbonate 25.9 20.0 - 28.0 mmol/L   Acid-Base Excess 2.7 (H) 0.0 - 2.0 mmol/L   O2 Saturation 93.0 %   Patient temperature 37.0    Collection site RIGHT BRACHIAL    Sample type ARTERIAL DRAW    Allens test (pass/fail) PASS PASS   No results found.  Review of Systems  Constitutional: Negative for chills and fever.  HENT: Positive for tinnitus. Negative for sore throat.   Eyes: Negative for blurred vision and redness.  Respiratory: Negative for cough and shortness of breath.   Cardiovascular: Negative for chest pain, palpitations, orthopnea and PND.  Gastrointestinal: Negative for abdominal pain, diarrhea, nausea and vomiting.  Genitourinary: Negative for dysuria, frequency and urgency.  Musculoskeletal: Negative for joint pain and myalgias.  Skin: Negative for rash.       No lesions  Neurological: Positive for dizziness. Negative for speech change, focal weakness and weakness.  Endo/Heme/Allergies: Does not bruise/bleed easily.       No temperature intolerance  Psychiatric/Behavioral: Negative for depression and suicidal ideas.    Blood pressure 100/67, pulse 83, temperature 98.1 F (36.7 C), temperature source Oral, resp. rate 18, height _0  (1.727 m), weight 93.1 kg (205 lb 4 oz), SpO2 93 %. Physical Exam  Vitals reviewed. Constitutional: She is oriented to person,  place, and time. She appears well-developed and well-nourished. No distress.  HENT:  Head: Normocephalic and atraumatic.  Mouth/Throat: Oropharynx is clear and moist.  Eyes: Pupils are equal, round, and reactive to light. Conjunctivae and EOM are normal. No scleral icterus.  Neck: Normal range of motion. Neck supple. No JVD present. No tracheal deviation present. No thyromegaly present.  Cardiovascular: Normal rate, regular rhythm and normal heart sounds.  Exam reveals no gallop and no friction rub.   No murmur heard. Respiratory: Effort normal and breath sounds normal.  GI: Soft. Bowel sounds are normal. She exhibits no distension. There is no tenderness.  Genitourinary:  Genitourinary Comments: Deferred  Musculoskeletal: Normal range of motion. She exhibits no edema.  Lymphadenopathy:    She has no cervical adenopathy.  Neurological: She is alert and oriented to person, place, and time. No cranial nerve deficit. She exhibits normal muscle tone.  Skin: Skin is warm and dry. No rash noted. No erythema.  Psychiatric: She has a normal mood and affect. Her behavior is normal. Judgment and thought content normal.     Assessment/Plan This is a 41 year old female admitted for salicylate toxicity. 1. Salicylate overdose: Continue bicarbonate drip. Follow ABGs. The patient has tinnitus but no respiratory distress. 2. Alcohol abuse: CIWA scale 3. Suicidal ideation: The patient is voluntarily committed and will be discharged to behavioral health once she is medically stable. 4. Major depressive episode:continue Remeron and venlafaxine 5. DVT prophylaxis: Lovenox 6. GI prophylaxis: None The patient is a full code. Time spent on admission orders and critical care approximately 45 minutes. Discussed with [facility]  telemedicine  Harrie Foreman, MD 04/09/2017, 6:15 AM

## 2017-04-09 NOTE — Care Management (Signed)
Patient is under involuntary commitment for suicide ideation. Receiving treatment for salicylate toxicity. Anticipate discharge to behavioral med when medically stable.

## 2017-04-09 NOTE — ED Notes (Signed)
Critical lab result read to Zenda AlpersWebster, MD.

## 2017-04-09 NOTE — ED Provider Notes (Signed)
[facility] Emergency Department Provider Note   ____________________________________________   First MD Initiated Contact with Patient 04/08/17 2346     (approximate)  I have reviewed the triage vital signs and the nursing notes.   HISTORY  Chief Complaint Suicidal    HPI Sandy Mason is a 41 y.o. female Who comes into the hospital today with some suicidal ideation. The patient states that May 15 his anniversary for her not being hospitalized for mental health for a year. The patient reports though that sometimes outpatient therapy she receives is not enough. She reports that people canceled too much and are not always there for her. She reports that she could only be there for herself. The patient reports that there is a lot of stuff going on. She feels that this cast in her life and is a lot of little things that are making her feel this way. She states that she has been working and trying to take care of herself but it's too much. She reports that she hasn't had peer supportin the last 3 months and she is been off of her medications for 5 weeks. She reports that she was getting them for medication management but then when she got Medicaid she started getting them from her job. The patient did have some chest tightness and shortness of breath and was seen here recently for the same. She states that she has been drinking today but not doing any drugs. The patient called the ambulance as she was concerned she might try to kill herself that she didn't trust herself to be home alone.   Past Medical History:  Diagnosis Date  . Alcohol use disorder severe. 12/18/2014  . Anxiety   . Borderline personality disorder 12/18/2014  . CVA (cerebral infarction)   . Depression   . 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 Opioid use disorder, severe, dependence (HCC) 12/20/2014  . PTSD (post-traumatic stress disorder) 03/28/2015    . 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 Seizures (HCC)   . Suicide attempt by hanging [facility](HCC)     Patient Active Problem List   Diagnosis Date Noted  . Salicylate overdose 04/09/2017  . AKI (acute kidney injury) (HCC) 04/03/2016  . Suicidal behavior 01/16/2016  . Alcohol intoxication (HCC)   . Severe episode of recurrent major depressive disorder, without psychotic features (HCC)   . Malingering 12/16/2015  . Major depressive disorder, recurrent episode, moderate (HCC) 12/12/2015  . Closed pelvic fracture (HCC) 10/31/2015  . Tobacco abuse 10/31/2015  . Tobacco use disorder 03/28/2015  . PTSD (post-traumatic stress disorder) 03/28/2015  . Stimulant use disorder (cocaine) 03/28/2015  . 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 Opioid use disorder, severe, dependence (HCC) 12/20/2014  . Alcohol use disorder severe. 12/18/2014  . Borderline personality disorder 12/18/2014    History reviewed. No pertinent surgical history.  Prior to Admission medications   Medication Sig Start Date End Date Taking? Authorizing Provider  famotidine (PEPCID) 20 MG tablet Take 20 mg by mouth 2 (two) times daily.   Yes [provider]  gabapentin (NEURONTIN) 300 MG capsule Take 3 capsules (900 mg total) by mouth 3 (three) times daily. Patient taking differently: Take 300 mg by mouth 3 (three) times daily.  02/16/16  Yes Clapacs, Jackquline DenmarkJohn T, MD  mirtazapine (REMERON) 15 MG tablet Take 1 tablet (15 mg total) by mouth at bedtime. 02/16/16  Yes  Clapacs, Jackquline Denmark, MD  naproxen (NAPROSYN) 500 MG tablet Take 500 mg by mouth 2 (two) times daily with a meal.   Yes [provider]  venlafaxine XR (EFFEXOR-XR) 150 MG 24 hr capsule Take 2 capsules (300 mg total) by mouth daily with breakfast. 02/16/16  Yes Clapacs, Jackquline Denmark, MD  albuterol (PROVENTIL HFA;VENTOLIN HFA) 108 (90 Base) MCG/ACT inhaler Inhale 2 puffs into the  lungs every 6 (six) hours as needed for wheezing or shortness of breath. 03/19/17   Willy Eddy, MD  diazepam (VALIUM) 5 MG tablet Take 1 tablet (5 mg total) by mouth at bedtime. Patient not taking: Reported on 04/09/2017 03/19/17 03/19/18  Willy Eddy, MD  dicyclomine (BENTYL) 20 MG tablet Take 1 tablet (20 mg total) by mouth 3 (three) times daily as needed (abdominal pain). Patient not taking: Reported on 04/09/2017 08/22/16   Phineas Semen, MD  hydrOXYzine (VISTARIL) 25 MG capsule Take 1 capsule (25 mg total) by mouth every 8 (eight) hours as needed for anxiety. Patient not taking: Reported on 04/09/2017 02/16/16   Clapacs, Jackquline Denmark, MD  levofloxacin (LEVAQUIN) 250 MG tablet Take 1 tablet (250 mg total) by mouth daily. Patient not taking: Reported on 04/16/2016 04/05/16   Enedina Finner, MD  lidocaine (LIDODERM) 5 % Place 2 patches onto the skin daily. Remove & Discard patch within 12 hours or as directed by MD Patient not taking: Reported on 04/09/2017 12/14/15   Jimmy Footman, MD  meloxicam (MOBIC) 7.5 MG tablet Take 1 tablet (7.5 mg total) by mouth 2 (two) times daily with a meal. Patient not taking: Reported on 04/09/2017 02/16/16   Clapacs, Jackquline Denmark, MD  metoprolol tartrate (LOPRESSOR) 25 MG tablet Take 0.5 tablets (12.5 mg total) by mouth 2 (two) times daily. Patient not taking: Reported on 04/09/2017 04/05/16   Enedina Finner, MD  Multiple Vitamin (MULTIVITAMIN WITH MINERALS) TABS tablet Take 1 tablet by mouth daily. Patient not taking: Reported on 04/16/2016 04/05/16   Enedina Finner, MD  ondansetron (ZOFRAN ODT) 4 MG disintegrating tablet Take 1 tablet (4 mg total) by mouth every 8 (eight) hours as needed for nausea or vomiting. Patient not taking: Reported on 04/09/2017 01/28/17   Emily Filbert, MD  ondansetron (ZOFRAN) 4 MG tablet Take 1 tablet (4 mg total) by mouth every 8 (eight) hours as needed for nausea or vomiting. Patient not taking: Reported on 04/09/2017 08/22/16   Phineas Semen, MD  prazosin (MINIPRESS) 2 MG capsule Take 1 capsule (2 mg total) by mouth at bedtime. Patient not taking: Reported on 04/09/2017 02/16/16   Clapacs, Jackquline Denmark, MD  promethazine (PHENERGAN) 12.5 MG tablet Take 1 tablet (12.5 mg total) by mouth every 6 (six) hours as needed for nausea or vomiting. Patient not taking: Reported on 04/16/2016 04/05/16   Clapacs, Jackquline Denmark, MD  sulfamethoxazole-trimethoprim (BACTRIM DS) 800-160 MG tablet Take 1 tablet by mouth 2 (two) times daily. Patient not taking: Reported on 04/09/2017 01/28/17   Emily Filbert, MD    Allergies Depakote [valproic acid]; Haldol [haloperidol lactate]; and Keflet [cephalexin]  Family History  Problem Relation Age of Onset  . Diabetes Father     Social History Social History  Substance Use Topics  . Smoking status: Current Every Day Smoker    Packs/day: 1.00    Types: Cigarettes  . Smokeless tobacco: Never Used  . Alcohol use Yes    Review of Systems  Constitutional: No fever/chills Eyes: No visual changes. ENT: No sore throat. Cardiovascular: Denies chest  pain. Respiratory: Denies shortness of breath. Gastrointestinal: No abdominal pain.  No nausea, no vomiting.  No diarrhea.  No constipation. Genitourinary: Negative for dysuria. Musculoskeletal: Negative for back pain. Skin: Negative for rash. Neurological: Negative for headaches, focal weakness or numbness. psych: Alcohol intoxication and suicidal ideation  ____________________________________________   PHYSICAL EXAM:  VITAL SIGNS: ED Triage Vitals  Enc Vitals Group     BP 04/09/17 0008 122/84     Pulse Rate 04/09/17 0008 100     Resp 04/09/17 0008 17     Temp 04/09/17 0008 97.8 F (36.6 C)     Temp Source 04/09/17 0008 Oral     SpO2 04/09/17 0008 98 %     Weight 04/08/17 2341 235 lb (106.6 kg)     Height --      Head Circumference --      Peak Flow --      Pain Score --      Pain Loc --      Pain Edu? --      Excl. in GC? --      Constitutional: Alert and oriented. tearfuland in mild distress. Eyes: Conjunctivae are normal. PERRL. EOMI. Head: Atraumatic. Nose: No congestion/rhinnorhea. Mouth/Throat: Mucous membranes are moist.  Oropharynx non-erythematous. Cardiovascular: Normal rate, regular rhythm. Grossly normal heart sounds.  Good peripheral circulation. Respiratory: Normal respiratory effort.  No retractions. Lungs CTAB. Gastrointestinal: Soft and nontender. No distention. positive bowel sounds Musculoskeletal: No lower extremity tenderness nor edema.   Neurologic:  Normal speech and language.  Skin:  Skin is warm, dry and intact.  Psychiatric: Mood and affect are normal.   ____________________________________________   LABS (all labs ordered are listed, but only abnormal results are displayed)  Labs Reviewed  COMPREHENSIVE METABOLIC PANEL - Abnormal; Notable for the following:       Result Value   Potassium 3.2 (*)    CO2 21 (*)    Glucose, Bld 106 (*)    Calcium 8.4 (*)    All other components within normal limits  ETHANOL - Abnormal; Notable for the following:    Alcohol, Ethyl (B) 244 (*)    All other components within normal limits  SALICYLATE LEVEL - Abnormal; Notable for the following:    Salicylate Lvl 42.2 (*)    All other components within normal limits  ACETAMINOPHEN LEVEL - Abnormal; Notable for the following:    Acetaminophen (Tylenol), Serum <10 (*)    All other components within normal limits  BLOOD GAS, VENOUS - Abnormal; Notable for the following:    pCO2, Ven 34 (*)    pO2, Ven 65.0 (*)    All other components within normal limits  SALICYLATE LEVEL - Abnormal; Notable for the following:    Salicylate Lvl 34.4 (*)    All other components within normal limits  CBC  URINE DRUG SCREEN, QUALITATIVE ([facility] ONLY)  POC URINE PREG, ED   ____________________________________________  EKG  ED ECG REPORT I, Rebecka Apley, the attending physician, personally viewed and  interpreted this ECG.   Date: 04/09/2017  EKG Time: 202  Rate: 84  Rhythm: normal sinus rhythm  Axis: normal  Intervals:none  ST&T Change: none  ____________________________________________  RADIOLOGY  No results found.  ____________________________________________   PROCEDURES  Procedure(s) performed: None  Procedures  Critical Care performed: No  ____________________________________________   INITIAL IMPRESSION / ASSESSMENT AND PLAN / ED COURSE  Pertinent labs & imaging results that were available during my care of the patient were reviewed  by me and considered in my medical decision making (see chart for details).  this is a 41 year old female who comes into the hospital today with some suicidal thoughts. The patient was drinking today but denies taking any other medication. We did check some blood work and the patient's salicylate level was 42.2. I contacted poison control and they recommended alkalinizing the patient's urine with a bicarb drip although she is not having any symptoms at this time. The patient did become upset a few other times but has been otherwise resting. I will admit the patient to the hospitalist service for salicylate overdose and alcohol intoxication. She will then be evaluated and seen by psych.      ____________________________________________   FINAL CLINICAL IMPRESSION(S) / ED DIAGNOSES  Final diagnoses:  Salicylate overdose, undetermined intent, initial encounter  Suicidal ideation  Alcoholic intoxication without complication (HCC)      NEW MEDICATIONS STARTED DURING THIS VISIT:  New Prescriptions   No medications on file     Note:  This document was prepared using Dragon voice recognition software and may include unintentional dictation errors.    Rebecka Apley, MD 04/09/17 [phone]

## 2017-04-09 NOTE — Consult Note (Signed)
Name: Sandy Mason MRN: [id number] DOB: 1975/10/03    ADMISSION DATE:  04/08/2017 CONSULTATION DATE: 04/08/2017  REFERRING MD : Dr. Sheryle Hail   CHIEF COMPLAINT: Suicidal Ideation   BRIEF PATIENT DESCRIPTION:  41 yo female admitted 09/11 with suicidal ideation, elevated salicylate level, and alcohol intoxication   SIGNIFICANT EVENTS  09/10-Pt admitted to stepdown unit   STUDIES:  None   HISTORY OF PRESENT ILLNESS:   This is a 41 yo female with a PMH of Suicide Attempt by hanging, Seizures, PTSD, Substance Abuse, Major Depressive Disorder, CVA, Borderline Personality Disorder, Anxiety, and ETOH Abuse.  She presented to [facility] ER via EMS from home 09/10 with suicidal ideation.  Per ER notes the pt has been off her mental health medications x5 weeks and has been drinking alcohol since 11:00 am on 09/10, she had 4 shots of Patron and 6 [employer]. The pt also states she took 4 Bayer tablets for chronic pain the morning of 09/10, however she denies taking the medication in an attempt to harm herself.  She is currently in a custody battle, which prompted suicidal ideation.  In the ER lab results revealed K+ 3.2, salicylate level 42.2, alcohol ethyl 244, and urine drug screen negative.  Poison control contacted by ER physician with recommendation of sodium bicarb drip to alkalinize the pts urine.  She was subsequently involuntarily committed and admitted by hospitalist team to the stepdown unit for further workup and treatment PCCM consulted.  PAST MEDICAL HISTORY :   has a past medical history of Alcohol use disorder severe. (12/18/2014); Anxiety; Borderline personality disorder (12/18/2014); CVA (cerebral infarction); Depression; Major depressive disorder recurrent severe without psychotic features. (12/18/2014); Opioid use disorder, severe, dependence (HCC) (12/20/2014); PTSD (post-traumatic stress disorder) (03/28/2015); Sedative, hypnotic or anxiolytic use disorder, severe, dependence (HCC)  (03/27/2015); Seizures (HCC); and Suicide attempt by hanging (HCC).  has no past surgical history on file. Prior to Admission medications   Medication Sig Start Date End Date Taking? Authorizing Provider  famotidine (PEPCID) 20 MG tablet Take 20 mg by mouth 2 (two) times daily.   Yes [provider]  gabapentin (NEURONTIN) 300 MG capsule Take 3 capsules (900 mg total) by mouth 3 (three) times daily. Patient taking differently: Take 300 mg by mouth 3 (three) times daily.  02/16/16  Yes Clapacs, Jackquline Denmark, MD  mirtazapine (REMERON) 15 MG tablet Take 1 tablet (15 mg total) by mouth at bedtime. 02/16/16  Yes Clapacs, Jackquline Denmark, MD  naproxen (NAPROSYN) 500 MG tablet Take 500 mg by mouth 2 (two) times daily with a meal.   Yes [provider]  venlafaxine XR (EFFEXOR-XR) 150 MG 24 hr capsule Take 2 capsules (300 mg total) by mouth daily with breakfast. 02/16/16  Yes Clapacs, Jackquline Denmark, MD  albuterol (PROVENTIL HFA;VENTOLIN HFA) 108 (90 Base) MCG/ACT inhaler Inhale 2 puffs into the lungs every 6 (six) hours as needed for wheezing or shortness of breath. 03/19/17   Willy Eddy, MD  diazepam (VALIUM) 5 MG tablet Take 1 tablet (5 mg total) by mouth at bedtime. Patient not taking: Reported on 04/09/2017 03/19/17 03/19/18  Willy Eddy, MD  dicyclomine (BENTYL) 20 MG tablet Take 1 tablet (20 mg total) by mouth 3 (three) times daily as needed (abdominal pain). Patient not taking: Reported on 04/09/2017 08/22/16   Phineas Semen, MD  hydrOXYzine (VISTARIL) 25 MG capsule Take 1 capsule (25 mg total) by mouth every 8 (eight) hours as needed for anxiety. Patient not taking: Reported on 04/09/2017 02/16/16  Clapacs, Jackquline Denmark, MD  levofloxacin (LEVAQUIN) 250 MG tablet Take 1 tablet (250 mg total) by mouth daily. Patient not taking: Reported on 04/16/2016 04/05/16   Enedina Finner, MD  lidocaine (LIDODERM) 5 % Place 2 patches onto the skin daily. Remove & Discard patch within 12 hours or as directed by MD Patient  not taking: Reported on 04/09/2017 12/14/15   Jimmy Footman, MD  meloxicam (MOBIC) 7.5 MG tablet Take 1 tablet (7.5 mg total) by mouth 2 (two) times daily with a meal. Patient not taking: Reported on 04/09/2017 02/16/16   Clapacs, Jackquline Denmark, MD  metoprolol tartrate (LOPRESSOR) 25 MG tablet Take 0.5 tablets (12.5 mg total) by mouth 2 (two) times daily. Patient not taking: Reported on 04/09/2017 04/05/16   Enedina Finner, MD  Multiple Vitamin (MULTIVITAMIN WITH MINERALS) TABS tablet Take 1 tablet by mouth daily. Patient not taking: Reported on 04/16/2016 04/05/16   Enedina Finner, MD  ondansetron (ZOFRAN ODT) 4 MG disintegrating tablet Take 1 tablet (4 mg total) by mouth every 8 (eight) hours as needed for nausea or vomiting. Patient not taking: Reported on 04/09/2017 01/28/17   Emily Filbert, MD  ondansetron (ZOFRAN) 4 MG tablet Take 1 tablet (4 mg total) by mouth every 8 (eight) hours as needed for nausea or vomiting. Patient not taking: Reported on 04/09/2017 08/22/16   Phineas Semen, MD  prazosin (MINIPRESS) 2 MG capsule Take 1 capsule (2 mg total) by mouth at bedtime. Patient not taking: Reported on 04/09/2017 02/16/16   Clapacs, Jackquline Denmark, MD  promethazine (PHENERGAN) 12.5 MG tablet Take 1 tablet (12.5 mg total) by mouth every 6 (six) hours as needed for nausea or vomiting. Patient not taking: Reported on 04/16/2016 04/05/16   Clapacs, Jackquline Denmark, MD  sulfamethoxazole-trimethoprim (BACTRIM DS) 800-160 MG tablet Take 1 tablet by mouth 2 (two) times daily. Patient not taking: Reported on 04/09/2017 01/28/17   Emily Filbert, MD   Allergies  Allergen Reactions  . Depakote [Valproic Acid] Anaphylaxis and Other (See Comments)    Reaction:  Seizures   . Haldol [Haloperidol Lactate] Anaphylaxis and Other (See Comments)    Reaction:  Seizures   . Keflet [Cephalexin] Rash    FAMILY HISTORY:  family history includes Diabetes in her father. SOCIAL HISTORY:  reports that she has been smoking Cigarettes.   She has been smoking about 1.00 pack per day. She has never used smokeless tobacco. She reports that she drinks alcohol. She reports that she does not use drugs.  REVIEW OF SYSTEMS: Positives in BOLD  Constitutional: suicidal ideation, fever, chills, weight loss, malaise/fatigue and diaphoresis.  HENT: Negative for hearing loss, ear pain, nosebleeds, congestion, sore throat, neck pain, tinnitus and ear discharge.   Eyes: Negative for blurred vision, double vision, photophobia, pain, discharge and redness.  Respiratory: Negative for cough, hemoptysis, sputum production, shortness of breath, wheezing and stridor.   Cardiovascular: Negative for chest pain, palpitations, orthopnea, claudication, leg swelling and PND.  Gastrointestinal: Negative for heartburn, nausea, vomiting, abdominal pain, diarrhea, constipation, blood in stool and melena.  Genitourinary: Negative for dysuria, urgency, frequency, hematuria and flank pain.  Musculoskeletal: Negative for myalgias, back pain, joint pain and falls.  Skin: Negative for itching and rash.  Neurological: Negative for dizziness, tingling, tremors, sensory change, speech change, focal weakness, seizures, loss of consciousness, weakness and headaches.  Endo/Heme/Allergies: Negative for environmental allergies and polydipsia. Does not bruise/bleed easily.  SUBJECTIVE:  Pt currently does not have suicidal or homicidal ideation.  She states she still  feels "drunk"  VITAL SIGNS: Temp:  [97.8 F (36.6 C)] 97.8 F (36.6 C) (09/11 0008) Pulse Rate:  [100] 100 (09/11 0008) Resp:  [17] 17 (09/11 0008) BP: (122)/(84) 122/84 (09/11 0008) SpO2:  [98 %] 98 % (09/11 0008) Weight:  [106.6 kg (235 lb)] 106.6 kg (235 lb) (09/10 2341)  PHYSICAL EXAMINATION: General: well developed, well nourished Hispanic female, NAD  Neuro: alert and oriented, follows commands  HEENT: supple, no JVD Cardiovascular: s1s2, rrr, no M/R/G Lungs: clear throughout, even, non  labored Abdomen: +BS x4, soft, non tender, non distended  Musculoskeletal: normal bulk and tone, no edema  Skin: intact no rashes or lesions    Recent Labs Lab 04/08/17 2343  NA 142  K 3.2*  CL 111  CO2 21*  BUN 11  CREATININE 0.79  GLUCOSE 106*    Recent Labs Lab 04/08/17 2343  HGB 13.7  HCT 39.1  WBC 6.3  PLT 335   No results found.  ASSESSMENT / PLAN: Suicidal Ideation (Involuntarily Committed)  Elevated salicylate level  Hypokalemia  ETOH Intoxication Medication Noncompliance  P: Supplemental O2 to maintain O2 sats >92% Continuous telemetry monitoring Sitter at bedside at all times Suicide precautions Recheck salicylate level  ABG's q3hrs for now  Trend BMP's Replace electrolytes as indicated Monitor UOP Psychiatry consulted appreciate input  Continue Sodium Bicarb gtt for now  TSH pending  CIWA protocol Will start thiamine, folic acid, and mvi  Sonda Rumbleana Blakeney, AGNP  Pulmonary/Critical Care Pager [phone] (please enter 7 [phone]) PCCM Consult Pager [phone] (please enter 7 [phone])   PCCM ATTENDING ATTESTATION:  I have evaluated patient with the APP Blakeney, reviewed database in its entirety and discussed care plan in detail. In addition, this patient was discussed on multidisciplinary rounds.   There have been no major sequelae from this apparent intentional OD. She does not require ICU/SDU level of care. Will transfer and sign off  Billy Fischeravid Lamount Bankson, MD PCCM service Mobile [phone] Pager [phone] 04/09/2017 8:19 AM

## 2017-04-09 NOTE — Progress Notes (Signed)
[employer] - [place] at [facility]                                                                                                                                                                                  Patient Demographics   Sandy Mason, is a 41 y.o. female, DOB - October 07, 1975, NFA:[id number]  Admit date - 04/08/2017   Admitting Physician Arnaldo Natal, MD  Outpatient Primary MD for the patient is [place], [facility]   LOS - 0  Subjective: Patient admitted with salicylate overdose currently drowsy    Review of Systems:   CONSTITUTIONAL: Npatient drowsy  Vitals:   Vitals:   04/09/17 0600 04/09/17 0700 04/09/17 0800 04/09/17 0900  BP: 102/68 (!) 91/57 (!) 94/59 100/76  Pulse: 77 76 76 78  Resp: (!) 26 (!) 24 (!) 21 17  Temp:   98.1 F (36.7 C)   TempSrc:      SpO2: 92% 96% 90% 95%  Weight:      Height:        Wt Readings from Last 3 Encounters:  04/09/17 205 lb 4 oz (93.1 kg)  03/19/17 235 lb (106.6 kg)  01/28/17 200 lb (90.7 kg)     Intake/Output Summary (Last 24 hours) at 04/09/17 1508 Last data filed at 04/09/17 0931  Gross per 24 hour  Intake           936.67 ml  Output              300 ml  Net           636.67 ml    Physical Exam:   GENERAL: Pleasant-appearing in no apparent distress.  HEAD, EYES, EARS, NOSE AND THROAT: Atraumatic, normocephalic. Extraocular muscles are intact. Pupils equal and reactive to light. Sclerae anicteric. No conjunctival injection. No oro-pharyngeal erythema.  NECK: Supple. There is no jugular venous distention. No bruits, no lymphadenopathy, no thyromegaly.  HEART: Regular rate and rhythm,. No murmurs, no rubs, no clicks.  LUNGS: Clear to auscultation bilaterally. No rales or rhonchi. No wheezes.  ABDOMEN: Soft, flat, nontender, nondistended. Has good bowel sounds. No hepatosplenomegaly appreciated.  EXTREMITIES: No evidence of any cyanosis, clubbing, or peripheral edema.  +2 pedal and  radial pulses bilaterally.  NEUROLOGIC: drowsy  SKIN: Moist and warm with no rashes appreciated.  Psych: Not anxious, depressed LN: No inguinal LN enlargement    Antibiotics   Anti-infectives    None      Medications   Scheduled Meds: . docusate sodium  100 mg Oral BID  . enoxaparin (LOVENOX) injection  40 mg Subcutaneous Q24H  . folic acid  1 mg Oral Daily  .  gabapentin  900 mg Oral TID  . mirtazapine  15 mg Oral QHS  . multivitamin with minerals  1 tablet Oral Daily  . potassium chloride  40 mEq Oral Once  . thiamine  100 mg Oral Daily  . venlafaxine XR  300 mg Oral Q breakfast   Continuous Infusions: PRN Meds:.LORazepam, [DISCONTINUED] ondansetron **OR** ondansetron (ZOFRAN) IV   Data Review:   Micro Results Recent Results (from the past 240 hour(s))  MRSA PCR Screening     Status: None   Collection Time: 04/09/17  4:41 AM  Result Value Ref Range Status   MRSA by PCR NEGATIVE NEGATIVE Final    Comment:        The GeneXpert MRSA Assay (FDA approved for NASAL specimens only), is one component of a comprehensive MRSA colonization surveillance program. It is not intended to diagnose MRSA infection nor to guide or monitor treatment for MRSA infections.     Radiology Reports Dg Chest 2 View  Result Date: 03/19/2017 CLINICAL DATA:  Chest pain beginning today.  Anxiety. EXAM: CHEST  2 VIEW COMPARISON:  08/22/2016 FINDINGS: The heart size and mediastinal contours are within normal limits. New asymmetric opacity is seen in the left lower lung with partial silhouetting of the left heart border, suspicious for pneumonia. Right lung is clear. No evidence of pleural effusion. IMPRESSION: Asymmetric opacity in lingula, suspicious for pneumonia. Recommend clinical correlation; consider followup PA and lateral chest X-ray in 3-4 weeks following trial of antibiotic therapy to ensure resolution . Electronically Signed   By: Myles Rosenthal M.D.   On: 03/19/2017 18:20      CBC  Recent Labs Lab 04/08/17 2343 04/09/17 0440  WBC 6.3 5.9  HGB 13.7 12.0  HCT 39.1 34.6*  PLT 335 282  MCV 93.1 92.2  MCH 32.5 31.9  MCHC 35.0 34.6  RDW 13.5 13.1  LYMPHSABS  --  4.3*  MONOABS  --  0.2  EOSABS  --  0.2  BASOSABS  --  0.0    Chemistries   Recent Labs Lab 04/08/17 2343 04/09/17 0440  NA 142 142  K 3.2* 3.1*  CL 111 110  CO2 21* 23  GLUCOSE 106* 97  BUN 11 11  CREATININE 0.79 0.69  CALCIUM 8.4* 8.0*  MG  --  2.1  AST 31  --   ALT 25  --   ALKPHOS 49  --   BILITOT 0.3  --    ------------------------------------------------------------------------------------------------------------------ estimated creatinine clearance is 111.6 mL/min (by C-G formula based on SCr of 0.69 mg/dL). ------------------------------------------------------------------------------------------------------------------  Recent Labs  04/09/17 0440  HGBA1C 5.7*   ------------------------------------------------------------------------------------------------------------------ No results for input(s): CHOL, HDL, LDLCALC, TRIG, CHOLHDL, LDLDIRECT in the last 72 hours. ------------------------------------------------------------------------------------------------------------------  Recent Labs  04/09/17 0440  TSH 2.526   ------------------------------------------------------------------------------------------------------------------ No results for input(s): VITAMINB12, FOLATE, FERRITIN, TIBC, IRON, RETICCTPCT in the last 72 hours.  Coagulation profile No results for input(s): INR, PROTIME in the last 168 hours.  No results for input(s): DDIMER in the last 72 hours.  Cardiac Enzymes No results for input(s): CKMB, TROPONINI, MYOGLOBIN in the last 168 hours.  Invalid input(s): CK ------------------------------------------------------------------------------------------------------------------ Invalid input(s): POCBNP    Assessment & Plan   This is a  41 year old female admitted for salicylate toxicity. 1. Salicylate overdose: Continue bicarbonate drip. Follow ABGs. The patient has tinnitus but no respiratory distress.he repeat levels tomorrow 2. Alcohol abuse: CIWA scale 3. Suicidal ideation: The patient is voluntarily committed and pych eval 4. Major depressive episode:continue Remeron and  venlafaxin neeed psychiatric evaluation 5. DVT prophylaxis: Lovenox 6. GI prophylaxis: None      Code Status Orders        Start     Ordered   04/09/17 0354  Full code  Continuous     04/09/17 0353    Code Status History    Date Active Date Inactive Code Status Order ID Comments User Context   04/03/2016 11:49 PM 04/05/2016  5:33 PM Full Code [id number]  Hugelmeyer, Jon GillsAlexis, DO Inpatient   12/25/2015  8:43 PM 01/09/2016  6:50 PM Full Code [id number]  Jeanmarie PlantMcShane, James A, MD ED   12/12/2015 12:56 AM 12/12/2015 12:56 AM Full Code [id number]  Audery Amellapacs, John T, MD Inpatient   12/12/2015 12:56 AM 12/16/2015  7:56 PM Full Code [id number]  Audery Amellapacs, John T, MD Inpatient   12/10/2015 10:25 AM 12/12/2015 12:56 AM Full Code [id number]  Adrian SaranMody, Sital, MD Inpatient   10/31/2015  9:49 PM 11/01/2015  7:15 PM Full Code [id number]  Robley Friesseni, Abdullahi, MD ED   03/27/2015  1:59 AM 03/29/2015  1:20 PM Full Code [id number]  Beau Fannyhalla, Surya K, MD Inpatient   12/19/2014  6:28 PM 12/23/2014  4:30 PM Full Code [id number]  Audery Amellapacs, John T, MD Inpatient   12/17/2014  9:16 PM 12/19/2014  6:28 PM Full Code [id number]  Katha HammingKonidena, Snehalatha, MD Inpatient           Consults  Ccm, pych   DVT Prophylaxis  Lovenox    Lab Results  Component Value Date   PLT 282 04/09/2017     Time Spent in minutes  35min Greater than 50% of time spent in care coordination and counseling patient regarding the condition and plan of care.   Auburn BilberryPATEL, Teila Skalsky M.D on 04/09/2017 at 3:08 PM  Between 7am to 6pm - Pager - [phone]  After 6pm go to [email or web address] - password [email or web address] [facility]  [facility]   Office   [phone]

## 2017-04-09 NOTE — Progress Notes (Signed)
Report received from Bellevuemyra, Charity fundraiserN.  In to check on pt.  Pt in bed, bed locked and in low position.  No complaints at this time, sitter at bedside.  Will continue to monitor.

## 2017-04-10 DIAGNOSIS — F332 Major depressive disorder, recurrent severe without psychotic features: Secondary | ICD-10-CM

## 2017-04-10 MED ORDER — FOLIC ACID 1 MG PO TABS
1.0000 mg | ORAL_TABLET | Freq: Every day | ORAL | Status: DC
  Administered 2017-04-10 – 2017-04-11 (×2): 1 mg via ORAL
  Filled 2017-04-10 (×2): qty 1

## 2017-04-10 MED ORDER — LORAZEPAM 2 MG/ML IJ SOLN
1.0000 mg | Freq: Four times a day (QID) | INTRAMUSCULAR | Status: DC | PRN
  Administered 2017-04-10: 1 mg via INTRAVENOUS
  Filled 2017-04-10: qty 1

## 2017-04-10 MED ORDER — ADULT MULTIVITAMIN W/MINERALS CH
1.0000 | ORAL_TABLET | Freq: Every day | ORAL | Status: DC
  Administered 2017-04-10 – 2017-04-11 (×2): 1 via ORAL
  Filled 2017-04-10 (×3): qty 1

## 2017-04-10 MED ORDER — PRAZOSIN HCL 2 MG PO CAPS
2.0000 mg | ORAL_CAPSULE | Freq: Every day | ORAL | Status: DC
  Administered 2017-04-10: 2 mg via ORAL
  Filled 2017-04-10: qty 1

## 2017-04-10 MED ORDER — ENOXAPARIN SODIUM 40 MG/0.4ML ~~LOC~~ SOLN
40.0000 mg | SUBCUTANEOUS | Status: DC
  Administered 2017-04-10: 40 mg via SUBCUTANEOUS
  Filled 2017-04-10: qty 0.4

## 2017-04-10 MED ORDER — LORAZEPAM 1 MG PO TABS
1.0000 mg | ORAL_TABLET | Freq: Four times a day (QID) | ORAL | Status: DC | PRN
  Administered 2017-04-11: 09:00:00 1 mg via ORAL
  Filled 2017-04-10: qty 1

## 2017-04-10 NOTE — Progress Notes (Addendum)
[employer] - [place] at [facility]                                                                                                                                                                                  Patient Demographics   Sandy Mason, is a 41 y.o. female, DOB - 02-Dec-1975, OZD:[id number]  Admit date - 04/08/2017   Admitting Physician Arnaldo Natal, MD  Outpatient Primary MD for the patient is [place], [facility]   LOS - 1  Subjective: Currently awake. States that she's been feeling anxious. She had told the nurse that she was looking for things in the room to kill herself    Review of Systems:   CONSTITUTIONAL: No documented fever. No fatigue, weakness. No weight gain, no weight loss.  EYES: No blurry or double vision.  ENT: No tinnitus. No postnasal drip. No redness of the oropharynx.  RESPIRATORY: No cough, no wheeze, no hemoptysis. No dyspnea.  CARDIOVASCULAR: No chest pain. No orthopnea. No palpitations. No syncope.  GASTROINTESTINAL: No nausea, no vomiting or diarrhea. No abdominal pain. No melena or hematochezia.  GENITOURINARY:  No urgency. No frequency. No dysuria. No hematuria. No obstructive symptoms. No discharge. No pain. No significant abnormal bleeding ENDOCRINE: No polyuria or nocturia. No heat or cold intolerance.  HEMATOLOGY: No anemia. No bruising. No bleeding. No purpura. No petechiae INTEGUMENTARY: No rashes. No lesions.  MUSCULOSKELETAL: No arthritis. No swelling. No gout.  NEUROLOGIC: No numbness, tingling, or ataxia. No seizure-type activity.  PSYCHIATRIC:  Positive anxiety positive depression positive suicidal ideation     Vitals:   Vitals:   04/09/17 1900 04/09/17 2015 04/09/17 2049 04/10/17 0458  BP: 135/79 132/81 132/81 121/81  Pulse: 78 63 65 62  Resp: (!) Temp:  97.7 F (36.5 C) 97.7 F (36.5 C) 98.7 F (37.1 C)  TempSrc:  Oral Oral Oral  SpO2: 92% 99% 97% 96%  Weight:  207 lb (93.9  kg)    Height:   (1.676 m)      Wt Readings from Last 3 Encounters:  04/09/17 207 lb (93.9 kg)  03/19/17 235 lb (106.6 kg)  01/28/17 200 lb (90.7 kg)     Intake/Output Summary (Last 24 hours) at 04/10/17 1331 Last data filed at 04/10/17 1245  Gross per 24 hour  Intake             1080 ml  Output                0 ml  Net             1080 ml    Physical Exam:  GENERAL: Pleasant-appearing in no apparent distress.  HEAD, EYES, EARS, NOSE AND THROAT: Atraumatic, normocephalic. Extraocular muscles are intact. Pupils equal and reactive to light. Sclerae anicteric. No conjunctival injection. No oro-pharyngeal erythema.  NECK: Supple. There is no jugular venous distention. No bruits, no lymphadenopathy, no thyromegaly.  HEART: Regular rate and rhythm,. No murmurs, no rubs, no clicks.  LUNGS: Clear to auscultation bilaterally. No rales or rhonchi. No wheezes.  ABDOMEN: Soft, flat, nontender, nondistended. Has good bowel sounds. No hepatosplenomegaly appreciated.  EXTREMITIES: No evidence of any cyanosis, clubbing, or peripheral edema.  +2 pedal and radial pulses bilaterally.  NEUROLOGIC: drowsy  SKIN: Moist and warm with no rashes appreciated.  Psych: depressed LN: No inguinal LN enlargement    Antibiotics   Anti-infectives    None      Medications   Scheduled Meds: . docusate sodium  100 mg Oral BID  . enoxaparin (LOVENOX) injection  40 mg Subcutaneous Q24H  . folic acid  1 mg Oral Daily  . gabapentin  900 mg Oral TID  . mirtazapine  15 mg Oral QHS  . multivitamin with minerals  1 tablet Oral Daily  . potassium chloride  40 mEq Oral Once  . thiamine  100 mg Oral Daily  . venlafaxine XR  300 mg Oral Q breakfast   Continuous Infusions: PRN Meds:.LORazepam, [DISCONTINUED] ondansetron **OR** ondansetron (ZOFRAN) IV   Data Review:   Micro Results Recent Results (from the past 240 hour(s))  MRSA PCR Screening     Status: None   Collection Time: 04/09/17  4:41 AM   Result Value Ref Range Status   MRSA by PCR NEGATIVE NEGATIVE Final    Comment:        The GeneXpert MRSA Assay (FDA approved for NASAL specimens only), is one component of a comprehensive MRSA colonization surveillance program. It is not intended to diagnose MRSA infection nor to guide or monitor treatment for MRSA infections.     Radiology Reports Dg Chest 2 View  Result Date: 03/19/2017 CLINICAL DATA:  Chest pain beginning today.  Anxiety. EXAM: CHEST  2 VIEW COMPARISON:  08/22/2016 FINDINGS: The heart size and mediastinal contours are within normal limits. New asymmetric opacity is seen in the left lower lung with partial silhouetting of the left heart border, suspicious for pneumonia. Right lung is clear. No evidence of pleural effusion. IMPRESSION: Asymmetric opacity in lingula, suspicious for pneumonia. Recommend clinical correlation; consider followup PA and lateral chest X-ray in 3-4 weeks following trial of antibiotic therapy to ensure resolution . Electronically Signed   By: Myles RosenthalJohn  Stahl M.D.   On: 03/19/2017 18:20     CBC  Recent Labs Lab 04/08/17 2343 04/09/17 0440  WBC 6.3 5.9  HGB 13.7 12.0  HCT 39.1 34.6*  PLT 335 282  MCV 93.1 92.2  MCH 32.5 31.9  MCHC 35.0 34.6  RDW 13.5 13.1  LYMPHSABS  --  4.3*  MONOABS  --  0.2  EOSABS  --  0.2  BASOSABS  --  0.0    Chemistries   Recent Labs Lab 04/08/17 2343 04/09/17 0440  NA 142 142  K 3.2* 3.1*  CL 111 110  CO2 21* 23  GLUCOSE 106* 97  BUN 11 11  CREATININE 0.79 0.69  CALCIUM 8.4* 8.0*  MG  --  2.1  AST 31  --   ALT 25  --   ALKPHOS 49  --   BILITOT 0.3  --    ------------------------------------------------------------------------------------------------------------------ estimated creatinine clearance is 107.9  mL/min (by C-G formula based on SCr of 0.69 mg/dL). ------------------------------------------------------------------------------------------------------------------  Recent Labs   04/09/17 0440  HGBA1C 5.7*   ------------------------------------------------------------------------------------------------------------------ No results for input(s): CHOL, HDL, LDLCALC, TRIG, CHOLHDL, LDLDIRECT in the last 72 hours. ------------------------------------------------------------------------------------------------------------------  Recent Labs  04/09/17 0440  TSH 2.526   ------------------------------------------------------------------------------------------------------------------ No results for input(s): VITAMINB12, FOLATE, FERRITIN, TIBC, IRON, RETICCTPCT in the last 72 hours.  Coagulation profile No results for input(s): INR, PROTIME in the last 168 hours.  No results for input(s): DDIMER in the last 72 hours.  Cardiac Enzymes No results for input(s): CKMB, TROPONINI, MYOGLOBIN in the last 168 hours.  Invalid input(s): CK ------------------------------------------------------------------------------------------------------------------ Invalid input(s): POCBNP    Assessment & Plan   This is a 41 year old female admitted for salicylate toxicity. 1. Salicylate overdose: Status post therapy bicarbonate drip has been discontinued 2. Alcohol abuse: continue CIWA scale 3. Suicidal ideation: The patient is voluntarily committed and pych eval  currently pending 4. Major depressive episode:continue Remeron and venlafaxin neeed psychiatric evaluation 5. Nicotine abuse smoking cession provided, 4 min spent , recommended she stop smoking nicotine patch offred 6. GI prophylaxis: None      Code Status Orders        Start     Ordered   04/09/17 0354  Full code  Continuous     04/09/17 0353    Code Status History    Date Active Date Inactive Code Status Order ID Comments User Context   04/03/2016 11:49 PM 04/05/2016  5:33 PM Full Code [id number]  Hugelmeyer, Jon Gills, DO Inpatient   12/25/2015  8:43 PM 01/09/2016  6:50 PM Full Code [id number]  Jeanmarie Plant, MD  ED   12/12/2015 12:56 AM 12/12/2015 12:56 AM Full Code [id number]  Audery Amel, MD Inpatient   12/12/2015 12:56 AM 12/16/2015  7:56 PM Full Code [id number]  Audery Amel, MD Inpatient   12/10/2015 10:25 AM 12/12/2015 12:56 AM Full Code [id number]  Adrian Saran, MD Inpatient   10/31/2015  9:49 PM 11/01/2015  7:15 PM Full Code [id number]  Robley Fries, MD ED   03/27/2015  1:59 AM 03/29/2015  1:20 PM Full Code [id number]  Beau Fanny, MD Inpatient   12/19/2014  6:28 PM 12/23/2014  4:30 PM Full Code [id number]  Audery Amel, MD Inpatient   12/17/2014  9:16 PM 12/19/2014  6:28 PM Full Code [id number]  Katha Hamming, MD Inpatient           Consults  Ccm, pych   DVT Prophylaxis  Lovenox    Lab Results  Component Value Date   PLT 282 04/09/2017     Time Spent in minutes  Greater than 50% of time spent in care coordination and counseling patient regarding the condition and plan of care.   Auburn Bilberry M.D on 04/10/2017 at 1:31 PM  Between 7am to 6pm - Pager - [phone]  After 6pm go to [email or web address] - password [email or web address] [facility]  [facility]   Office  [phone]

## 2017-04-10 NOTE — Consult Note (Signed)
[facility] Consult   Reason for Consult:  Consult for 41 year old woman with a history of mood disorder and PTSD who came to the hospital with suicidal ideation Referring Physician:  Posey Pronto Patient Identification: Sandy Mason MRN:  [id number] Principal Diagnosis: Severe episode of recurrent major depressive disorder, without psychotic features [facility]) Diagnosis:   Patient Active Problem List   Diagnosis Date Noted  . Salicylate overdose [W09.811B] 04/09/2017  . AKI (acute kidney injury) ([place]) [N17.9] 04/03/2016  . Suicidal behavior [R46.89] 01/16/2016  . Alcohol intoxication ([place]) [F10.929]   . Severe episode of recurrent major depressive disorder, without psychotic features ([place]) [F33.2]   . Malingering [Z76.5] 12/16/2015  . Major depressive disorder, recurrent episode, moderate ([place]) [F33.1] 12/12/2015  . Closed pelvic fracture ([place]) [S32.9XXA] 10/31/2015  . Tobacco abuse [Z72.0] 10/31/2015  . Tobacco use disorder [F17.200] 03/28/2015  . PTSD (post-traumatic stress disorder) [F43.10] 03/28/2015  . Stimulant use disorder (cocaine) [F15.90] 03/28/2015  . Sedative, hypnotic or anxiolytic use disorder, severe, dependence ([place]) [F13.20] 03/27/2015  . Opioid use disorder, severe, dependence ([place]) [F11.20] 12/20/2014  . Alcohol use disorder severe. [F10.20] 12/18/2014  . Borderline personality disorder [F60.3] 12/18/2014    Total Time spent with patient: 1 hour  Subjective:   Sandy Mason is a 41 y.o. female patient admitted with "I've been going through a little rough time".  HPI:  Patient interviewed chart reviewed. Patient familiar from previous encounters. 41 year old woman who has a long history of mental health problems. She says for the past year she has been doing very well. She had been staying off of alcohol and drugs and had been following up with her mental health program. The last few weeks however she's been having more stresses on her mind. A lot of  this is related to her inability to ever get a visit with her daughter. She does not have any custody rights of the daughter and is at the mercy of the girl's father to allow her to see her. Patient also said she had not been getting enough hours at her job recently. In any case a couple days ago she decided that she could "treat herself" to a little alcohol. Wound up drinking at least a sixpack of beer a margarita an entire bottle of [employer] and several shots of tequila. She had a blackout. She wound up in the hospital reporting suicidal ideation. She was found to have an elevated salicylate level. It has no memory of whether she took any pills or did anything to harm herself. She admits however that recently she's been having suicidal thoughts in fact had even thought about having rope available to hang herself at her apartment. Sleep is been a little bit poor. Not having any hallucinations. She has been off of her Effexor for 5 weeks for supposedly financial reasons. Denies any other drug use besides the binge of alcohol.  Social history: Living by herself. Works at [employer]. Main goal in life is to try and regain some rights to see her daughter.  Medical history: History of seizures probably most of which have been related to alcohol although perhaps not all of them. Does have a history of a stroke at some point.  Substance abuse history: Long history of problematic alcohol abuse intermittent abuse of other drugs in the past. Recently had been staying clean and sober until the last couple days  Past Psychiatric History: Patient has a long history of mental health problems. She has had multiple hospitalizations  in the past. She is very proud of the fact that she has not been hospitalized in the last year. She has been following up with [facility] and trying to work on [place] type skills. She finds the Effexor very helpful but admits that she hasn't been taking it for several weeks. Also takes gabapentin  a low dose of mirtazapine and Minipress at night for nightmares. Diagnoses of depression and PTSD. He does have a history of serious suicide attempts and some aggression in the past.  Risk to Self: Is patient at risk for suicide?: No Risk to Others:   Prior Inpatient Therapy:   Prior Outpatient Therapy:    Past Medical History:  Past Medical History:  Diagnosis Date  . Alcohol use disorder severe. 12/18/2014  . Anxiety   . Borderline personality disorder 12/18/2014  . CVA (cerebral infarction)   . Depression   . 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 Opioid use disorder, severe, dependence ([place]) 12/20/2014  . PTSD (post-traumatic stress disorder) 03/28/2015  . Sedative, hypnotic or anxiolytic use disorder, severe, dependence ([place]) 03/27/2015  . Seizures ([place])   . Suicide attempt by hanging [facility])    History reviewed. No pertinent surgical history. Family History:  Family History  Problem Relation Age of Onset  . Diabetes Father    Family Psychiatric  History: Positive for depression Social History:  History  Alcohol Use  . Yes     History  Drug Use No    Social History   Social History  . Marital status: Single    Spouse name: N/A  . Number of children: N/A  . Years of education: N/A   Social History Main Topics  . Smoking status: Current Every Day Smoker    Packs/day: 1.00    Types: Cigarettes  . Smokeless tobacco: Never Used  . Alcohol use Yes  . Drug use: No  . Sexual activity: Yes    Birth control/ protection: IUD   Other Topics Concern  . None   Social History Narrative  . None   Additional Social History:    Allergies:   Allergies  Allergen Reactions  . Depakote [Valproic Acid] Anaphylaxis and Other (See Comments)    Reaction:  Seizures   . Haldol [Haloperidol Lactate] Anaphylaxis and Other (See Comments)    Reaction:  Seizures   . Keflet [Cephalexin] Rash    Labs:  Results for orders placed or  performed during the hospital encounter of 04/08/17 (from the past 48 hour(s))  Comprehensive metabolic panel     Status: Abnormal   Collection Time: 04/08/17 11:43 PM  Result Value Ref Range   Sodium 142 135 - 145 mmol/L   Potassium 3.2 (L) 3.5 - 5.1 mmol/L   Chloride 111 101 - 111 mmol/L   CO2 21 (L) 22 - 32 mmol/L   Glucose, Bld 106 (H) 65 - 99 mg/dL   BUN 11 6 - 20 mg/dL   Creatinine, Ser 0.79 0.44 - 1.00 mg/dL   Calcium 8.4 (L) 8.9 - 10.3 mg/dL   Total Protein 8.0 6.5 - 8.1 g/dL   Albumin 4.1 3.5 - 5.0 g/dL   AST 31 15 - 41 U/L   ALT 25 14 - 54 U/L   Alkaline Phosphatase 49 38 - 126 U/L   Total Bilirubin 0.3 0.3 - 1.2 mg/dL   GFR calc non Af Amer >60 >60 mL/min   GFR calc Af Amer >60 >60 mL/min    Comment: (NOTE) The  eGFR has been calculated using the CKD EPI equation. This calculation has not been validated in all clinical situations. eGFR's persistently <60 mL/min signify possible Chronic Kidney Disease.    Anion gap 10 5 - 15  Ethanol     Status: Abnormal   Collection Time: 04/08/17 11:43 PM  Result Value Ref Range   Alcohol, Ethyl (B) 244 (H) <5 mg/dL    Comment:        LOWEST DETECTABLE LIMIT FOR SERUM ALCOHOL IS 5 mg/dL FOR MEDICAL PURPOSES ONLY   Salicylate level     Status: Abnormal   Collection Time: 04/08/17 11:43 PM  Result Value Ref Range   Salicylate Lvl 74.1 (HH) 2.8 - 30.0 mg/dL    Comment: CRITICAL RESULT CALLED TO, READ BACK BY AND VERIFIED WITH VANESSA ASHLEY @ 0042 ON 04/09/2017 BY CAF   Acetaminophen level     Status: Abnormal   Collection Time: 04/08/17 11:43 PM  Result Value Ref Range   Acetaminophen (Tylenol), Serum <10 (L) 10 - 30 ug/mL    Comment:        THERAPEUTIC CONCENTRATIONS VARY SIGNIFICANTLY. A RANGE OF 10-30 ug/mL MAY BE AN EFFECTIVE CONCENTRATION FOR MANY PATIENTS. HOWEVER, SOME ARE BEST TREATED AT CONCENTRATIONS OUTSIDE THIS RANGE. ACETAMINOPHEN CONCENTRATIONS >150 ug/mL AT 4 HOURS AFTER INGESTION AND >50 ug/mL AT  12 HOURS AFTER INGESTION ARE OFTEN ASSOCIATED WITH TOXIC REACTIONS.   cbc     Status: None   Collection Time: 04/08/17 11:43 PM  Result Value Ref Range   WBC 6.3 3.6 - 11.0 K/uL   RBC 4.20 3.80 - 5.20 MIL/uL   Hemoglobin 13.7 12.0 - 16.0 g/dL   HCT 39.1 35.0 - 47.0 %   MCV 93.1 80.0 - 100.0 fL   MCH 32.5 26.0 - 34.0 pg   MCHC 35.0 32.0 - 36.0 g/dL   RDW 13.5 11.5 - 14.5 %   Platelets 335 150 - 440 K/uL  Urine Drug Screen, Qualitative     Status: None   Collection Time: 04/08/17 11:43 PM  Result Value Ref Range   Tricyclic, Ur Screen NONE DETECTED NONE DETECTED   Amphetamines, Ur Screen NONE DETECTED NONE DETECTED   MDMA (Ecstasy)Ur Screen NONE DETECTED NONE DETECTED   Cocaine Metabolite,Ur [place] NONE DETECTED NONE DETECTED   Opiate, Ur Screen NONE DETECTED NONE DETECTED   Phencyclidine (PCP) Ur S NONE DETECTED NONE DETECTED   Cannabinoid 50 Ng, Ur [place] NONE DETECTED NONE DETECTED   Barbiturates, Ur Screen NONE DETECTED NONE DETECTED   Benzodiazepine, Ur Scrn NONE DETECTED NONE DETECTED   Methadone Scn, Ur NONE DETECTED NONE DETECTED    Comment: (NOTE) 638  Tricyclics, urine               Cutoff 1000 ng/mL 200  Amphetamines, urine             Cutoff 1000 ng/mL 300  MDMA (Ecstasy), urine           Cutoff 500 ng/mL 400  Cocaine Metabolite, urine       Cutoff 300 ng/mL 500  Opiate, urine                   Cutoff 300 ng/mL 600  Phencyclidine (PCP), urine      Cutoff 25 ng/mL 700  Cannabinoid, urine              Cutoff 50 ng/mL 800  Barbiturates, urine             Cutoff  200 ng/mL 900  Benzodiazepine, urine           Cutoff 200 ng/mL 1000 Methadone, urine                Cutoff 300 ng/mL 1100 1200 The urine drug screen provides only a preliminary, unconfirmed 1300 analytical test result and should not be used for non-medical 1400 purposes. Clinical consideration and professional judgment should 1500 be applied to any positive drug screen result due to possible 1600 interfering  substances. A more specific alternate chemical method 1700 must be used in order to obtain a confirmed analytical result.  1800 Gas chromato graphy / mass spectrometry (GC/MS) is the preferred 1900 confirmatory method.   Blood gas, venous     Status: Abnormal   Collection Time: 04/09/17  1:50 AM  Result Value Ref Range   pH, Ven 7.43 7.250 - 7.430   pCO2, Ven 34 (L) 44.0 - 60.0 mmHg   pO2, Ven 65.0 (H) 32.0 - 45.0 mmHg   Bicarbonate 22.6 20.0 - 28.0 mmol/L   Acid-base deficit 1.2 0.0 - 2.0 mmol/L   O2 Saturation 93.0 %   Patient temperature 37.0    Collection site VENOUS    Sample type VENOUS   Salicylate level     Status: Abnormal   Collection Time: 04/09/17  2:01 AM  Result Value Ref Range   Salicylate Lvl 32.6 (HH) 2.8 - 30.0 mg/dL    Comment: CRITICAL RESULT CALLED TO, READ BACK BY AND VERIFIED WITH VANESSA  ASHLEY @ 7124 ON 04/09/2017 BY CAF   Glucose, capillary     Status: Abnormal   Collection Time: 04/09/17  3:53 AM  Result Value Ref Range   Glucose-Capillary 120 (H) 65 - 99 mg/dL  TSH     Status: None   Collection Time: 04/09/17  4:40 AM  Result Value Ref Range   TSH 2.526 0.350 - 4.500 uIU/mL    Comment: Performed by a 3rd Generation assay with a functional sensitivity of <=0.01 uIU/mL.  Hemoglobin A1c     Status: Abnormal   Collection Time: 04/09/17  4:40 AM  Result Value Ref Range   Hgb A1c MFr Bld 5.7 (H) 4.8 - 5.6 %    Comment: (NOTE) Pre diabetes:          5.7%-6.4% Diabetes:              >6.4% Glycemic control for   <7.0% adults with diabetes    Mean Plasma Glucose 116.89 mg/dL    Comment: Performed at [facility] [address], [place], [place] [address]  Salicylate level     Status: None   Collection Time: 04/09/17  4:40 AM  Result Value Ref Range   Salicylate Lvl 83.3 2.8 - 30.0 mg/dL  Basic metabolic panel     Status: Abnormal   Collection Time: 04/09/17  4:40 AM  Result Value Ref Range   Sodium 142 135 - 145 mmol/L   Potassium 3.1 (L)  3.5 - 5.1 mmol/L   Chloride 110 101 - 111 mmol/L   CO2 23 22 - 32 mmol/L   Glucose, Bld 97 65 - 99 mg/dL   BUN 11 6 - 20 mg/dL   Creatinine, Ser 0.69 0.44 - 1.00 mg/dL   Calcium 8.0 (L) 8.9 - 10.3 mg/dL   GFR calc non Af Amer >60 >60 mL/min   GFR calc Af Amer >60 >60 mL/min    Comment: (NOTE) The eGFR has been calculated using the CKD  EPI equation. This calculation has not been validated in all clinical situations. eGFR's persistently <60 mL/min signify possible Chronic Kidney Disease.    Anion gap 9 5 - 15  CBC with Differential/Platelet     Status: Abnormal   Collection Time: 04/09/17  4:40 AM  Result Value Ref Range   WBC 5.9 3.6 - 11.0 K/uL   RBC 3.75 (L) 3.80 - 5.20 MIL/uL   Hemoglobin 12.0 12.0 - 16.0 g/dL   HCT 34.6 (L) 35.0 - 47.0 %   MCV 92.2 80.0 - 100.0 fL   MCH 31.9 26.0 - 34.0 pg   MCHC 34.6 32.0 - 36.0 g/dL   RDW 13.1 11.5 - 14.5 %   Platelets 282 150 - 440 K/uL   Neutrophils Relative % 20 %   Lymphocytes Relative 73 %   Monocytes Relative 4 %   Eosinophils Relative 3 %   Basophils Relative 0 %   Band Neutrophils 0 %   Metamyelocytes Relative 0 %   Myelocytes 0 %   Promyelocytes Absolute 0 %   Blasts 0 %   nRBC 0 0 /100 WBC   Other 0 %   Neutro Abs 1.2 (L) 1.4 - 6.5 K/uL   Lymphs Abs 4.3 (H) 1.0 - 3.6 K/uL   Monocytes Absolute 0.2 0.2 - 0.9 K/uL   Eosinophils Absolute 0.2 0 - 0.7 K/uL   Basophils Absolute 0.0 0 - 0.1 K/uL   Smear Review MORPHOLOGY UNREMARKABLE   Magnesium     Status: None   Collection Time: 04/09/17  4:40 AM  Result Value Ref Range   Magnesium 2.1 1.7 - 2.4 mg/dL  MRSA PCR Screening     Status: None   Collection Time: 04/09/17  4:41 AM  Result Value Ref Range   MRSA by PCR NEGATIVE NEGATIVE    Comment:        The GeneXpert MRSA Assay (FDA approved for NASAL specimens only), is one component of a comprehensive MRSA colonization surveillance program. It is not intended to diagnose MRSA infection nor to guide or monitor  treatment for MRSA infections.   Blood gas, arterial     Status: Abnormal   Collection Time: 04/09/17  5:04 AM  Result Value Ref Range   FIO2 0.21    Delivery systems ROOM AIR    pH, Arterial 7.49 (H) 7.350 - 7.450   pCO2 arterial 34 32.0 - 48.0 mmHg   pO2, Arterial 61 (L) 83.0 - 108.0 mmHg   Bicarbonate 25.9 20.0 - 28.0 mmol/L   Acid-Base Excess 2.7 (H) 0.0 - 2.0 mmol/L   O2 Saturation 93.0 %   Patient temperature 37.0    Collection site RIGHT BRACHIAL    Sample type ARTERIAL DRAW    Allens test (pass/fail) PASS PASS  Salicylate level     Status: None   Collection Time: 04/09/17 10:18 AM  Result Value Ref Range   Salicylate Lvl 76.8 2.8 - 30.0 mg/dL    Current Facility-Administered Medications  Medication Dose Route Frequency Provider Last Rate Last Dose  . docusate sodium (COLACE) capsule 100 mg  100 mg Oral BID Harrie Foreman, MD   100 mg at 04/10/17 1045  . enoxaparin (LOVENOX) injection 40 mg  40 mg Subcutaneous Q24H Harrie Foreman, MD      . folic acid (FOLVITE) tablet 1 mg  1 mg Oral Daily Awilda Bill, NP   1 mg at 04/10/17 1045  . gabapentin (NEURONTIN) capsule 900 mg  900 mg Oral  TID Harrie Foreman, MD   900 mg at 04/10/17 1631  . LORazepam (ATIVAN) injection 2-3 mg  2-3 mg Intravenous Q1H PRN Awilda Bill, NP   2 mg at 04/10/17 1428  . mirtazapine (REMERON) tablet 15 mg  15 mg Oral QHS Harrie Foreman, MD   15 mg at 04/09/17 2122  . multivitamin with minerals tablet 1 tablet  1 tablet Oral Daily Awilda Bill, NP   1 tablet at 04/10/17 1045  . ondansetron (ZOFRAN) injection 4 mg  4 mg Intravenous Q6H PRN Harrie Foreman, MD      . potassium chloride SA (K-DUR,KLOR-CON) CR tablet 40 mEq  40 mEq Oral Once Awilda Bill, NP      . prazosin (MINIPRESS) capsule 2 mg  2 mg Oral QHS Jerod Mcquain T, MD      . thiamine (VITAMIN B-1) tablet 100 mg  100 mg Oral Daily Awilda Bill, NP   100 mg at 04/10/17 1045  . venlafaxine XR (EFFEXOR-XR) 24  hr capsule 300 mg  300 mg Oral Q breakfast Harrie Foreman, MD   300 mg at 04/10/17 1044    Musculoskeletal: Strength & Muscle Tone: within normal limits Gait & Station: normal Patient leans: N/A  Psychiatric Specialty Exam: Physical Exam  Nursing note and vitals reviewed. Constitutional: She appears well-developed and well-nourished.  HENT:  Head: Normocephalic and atraumatic.  Eyes: Pupils are equal, round, and reactive to light. Conjunctivae are normal.  Neck: Normal range of motion.  Cardiovascular: Normal heart sounds.   Respiratory: Effort normal.  GI: Soft.  Musculoskeletal: Normal range of motion.  Neurological: She is alert.  Skin: Skin is warm and dry.  Psychiatric: Her speech is delayed. She is slowed. Cognition and memory are normal. She expresses impulsivity. She exhibits a depressed mood. She expresses suicidal ideation. She expresses no suicidal plans.    Review of Systems  Constitutional: Negative.   HENT: Negative.   Eyes: Negative.   Respiratory: Negative.   Cardiovascular: Negative.   Gastrointestinal: Negative.   Musculoskeletal: Negative.   Skin: Negative.   Neurological: Negative.   Psychiatric/Behavioral: Positive for depression, memory loss, substance abuse and suicidal ideas. Negative for hallucinations. The patient is nervous/anxious. The patient does not have insomnia.     Blood pressure 139/81, pulse 78, temperature 98.1 F (36.7 C), temperature source Oral, resp. rate 18, height '5\' 6"'  (1.676 m), weight 93.9 kg (207 lb), SpO2 99 %.Body mass index is 33.41 kg/m.  General Appearance: Fairly Groomed  Eye Contact:  Good  Speech:  Clear and Coherent  Volume:  Normal  Mood:  Depressed  Affect:  Congruent  Thought Process:  Goal Directed  Orientation:  Full (Time, Place, and Person)  Thought Content:  Logical  Suicidal Thoughts:  Yes.  without intent/plan  Homicidal Thoughts:  No  Memory:  Immediate;   Good Recent;   Fair Remote;   Fair   Judgement:  Fair  Insight:  Fair  Psychomotor Activity:  Normal  Concentration:  Concentration: Fair  Recall:  [employer] of Knowledge:  Fair  Language:  Fair  Akathisia:  No  Handed:  Right  AIMS (if indicated):     Assets:  Desire for Improvement Housing Physical Health Social Support  ADL's:  Intact  Cognition:  WNL  Sleep:        Treatment Plan Summary: Daily contact with patient to assess and evaluate symptoms and progress in treatment, Medication management and Plan 41 year old  woman with a long history of mental health problems who had been doing well until recently. She came in intoxicated and possibly with an overdose. Patient continues to recognize some depression and suicidal ideation and feels more comfortable with admission. Case reviewed with TTS. Continue the IVC for now and I think we can plan to try and admit her tomorrow. I've made sure she is on her usual psychiatric medicines. I will go ahead and put in transfer orders. Patient agreeable to the plan.  Disposition: Recommend psychiatric Inpatient admission when medically cleared.  Alethia Berthold, MD 04/10/2017 6:24 PM

## 2017-04-10 NOTE — Consult Note (Signed)
Psychiatry: Consult received. Consult was placed yesterday afternoon but I was not actually notified of the consult until today, at a point over 24 hours after the consult was ordered thus making it impossible that I could've done it in the allotted time. I will see the patient this afternoon.

## 2017-04-10 NOTE — Plan of Care (Signed)
Problem: Safety: Goal: Ability to remain free from injury will improve Outcome: Progressing Patient remained Involuntarily Committed with safety sitter at bedside for suicidal thoughts.  Room environment surveyed, deemed safe for patient. No fall or injury thus far. Needs attended, kept safe and comfortable.

## 2017-04-11 ENCOUNTER — Inpatient Hospital Stay
Admission: AD | Admit: 2017-04-11 | Discharge: 2017-04-16 | DRG: 885 | Disposition: A | Discharge status: Home / Self Care | Payer: Medicaid Other | Source: Intra-hospital | Attending: Psychiatry | Admitting: Psychiatry

## 2017-04-11 DIAGNOSIS — Z915 Personal history of self-harm: Secondary | ICD-10-CM | POA: Diagnosis not present

## 2017-04-11 DIAGNOSIS — E781 Pure hyperglyceridemia: Secondary | ICD-10-CM | POA: Diagnosis present

## 2017-04-11 DIAGNOSIS — R45851 Suicidal ideations: Secondary | ICD-10-CM | POA: Diagnosis present

## 2017-04-11 DIAGNOSIS — K219 Gastro-esophageal reflux disease without esophagitis: Secondary | ICD-10-CM | POA: Diagnosis present

## 2017-04-11 DIAGNOSIS — Z818 Family history of other mental and behavioral disorders: Secondary | ICD-10-CM | POA: Diagnosis not present

## 2017-04-11 DIAGNOSIS — F4010 Social phobia, unspecified: Secondary | ICD-10-CM | POA: Diagnosis present

## 2017-04-11 DIAGNOSIS — J449 Chronic obstructive pulmonary disease, unspecified: Secondary | ICD-10-CM | POA: Diagnosis present

## 2017-04-11 DIAGNOSIS — F4310 Post-traumatic stress disorder, unspecified: Secondary | ICD-10-CM | POA: Diagnosis present

## 2017-04-11 DIAGNOSIS — F1010 Alcohol abuse, uncomplicated: Secondary | ICD-10-CM | POA: Diagnosis present

## 2017-04-11 DIAGNOSIS — G43909 Migraine, unspecified, not intractable, without status migrainosus: Secondary | ICD-10-CM | POA: Diagnosis present

## 2017-04-11 DIAGNOSIS — F332 Major depressive disorder, recurrent severe without psychotic features: Secondary | ICD-10-CM | POA: Diagnosis present

## 2017-04-11 DIAGNOSIS — Z9114 Patient's other noncompliance with medication regimen: Secondary | ICD-10-CM | POA: Diagnosis not present

## 2017-04-11 DIAGNOSIS — K5900 Constipation, unspecified: Secondary | ICD-10-CM | POA: Diagnosis present

## 2017-04-11 DIAGNOSIS — F17210 Nicotine dependence, cigarettes, uncomplicated: Secondary | ICD-10-CM | POA: Diagnosis present

## 2017-04-11 DIAGNOSIS — T39091A Poisoning by salicylates, accidental (unintentional), initial encounter: Secondary | ICD-10-CM | POA: Diagnosis present

## 2017-04-11 DIAGNOSIS — F603 Borderline personality disorder: Secondary | ICD-10-CM | POA: Diagnosis present

## 2017-04-11 DIAGNOSIS — Z8673 Personal history of transient ischemic attack (TIA), and cerebral infarction without residual deficits: Secondary | ICD-10-CM

## 2017-04-11 DIAGNOSIS — F10929 Alcohol use, unspecified with intoxication, unspecified: Secondary | ICD-10-CM | POA: Diagnosis present

## 2017-04-11 DIAGNOSIS — F17200 Nicotine dependence, unspecified, uncomplicated: Secondary | ICD-10-CM | POA: Diagnosis present

## 2017-04-11 LAB — BASIC METABOLIC PANEL
Anion gap: 6 (ref 5–15)
BUN: 11 mg/dL (ref 6–20)
CALCIUM: 8.7 mg/dL — AB (ref 8.9–10.3)
CO2: 25 mmol/L (ref 22–32)
CREATININE: 0.6 mg/dL (ref 0.44–1.00)
Chloride: 109 mmol/L (ref 101–111)
GFR calc Af Amer: >60 mL/min (ref >60)
Glucose, Bld: 96 mg/dL (ref 65–99)
POTASSIUM: 3.7 mmol/L (ref 3.5–5.1)
SODIUM: 140 mmol/L (ref 135–145)

## 2017-04-11 MED ORDER — ALBUTEROL SULFATE HFA 108 (90 BASE) MCG/ACT IN AERS
2.0000 | INHALATION_SPRAY | Freq: Four times a day (QID) | RESPIRATORY_TRACT | Status: DC | PRN
  Administered 2017-04-11 – 2017-04-14 (×4): 2 via RESPIRATORY_TRACT
  Filled 2017-04-11: qty 6.7

## 2017-04-11 MED ORDER — VITAMIN B-1 100 MG PO TABS
100.0000 mg | ORAL_TABLET | Freq: Every day | ORAL | Status: DC
  Administered 2017-04-11 – 2017-04-16 (×6): 100 mg via ORAL
  Filled 2017-04-11 (×6): qty 1

## 2017-04-11 MED ORDER — ACETAMINOPHEN 325 MG PO TABS
650.0000 mg | ORAL_TABLET | Freq: Four times a day (QID) | ORAL | Status: DC | PRN
  Administered 2017-04-11 – 2017-04-16 (×11): 650 mg via ORAL
  Filled 2017-04-11 (×11): qty 2

## 2017-04-11 MED ORDER — PRAZOSIN HCL 2 MG PO CAPS
2.0000 mg | ORAL_CAPSULE | Freq: Every day | ORAL | Status: DC
  Administered 2017-04-11 – 2017-04-12 (×2): 2 mg via ORAL
  Filled 2017-04-11 (×3): qty 1

## 2017-04-11 MED ORDER — MAGNESIUM HYDROXIDE 400 MG/5ML PO SUSP: 30.0000 mL | Freq: Every day | ORAL | Status: DC | PRN

## 2017-04-11 MED ORDER — HYDROXYZINE HCL 50 MG PO TABS
50.0000 mg | ORAL_TABLET | Freq: Three times a day (TID) | ORAL | Status: DC | PRN
  Administered 2017-04-11 – 2017-04-13 (×4): 50 mg via ORAL
  Filled 2017-04-11 (×5): qty 1

## 2017-04-11 MED ORDER — ALUM & MAG HYDROXIDE-SIMETH 200-200-20 MG/5ML PO SUSP: 30.0000 mL | ORAL | Status: DC | PRN

## 2017-04-11 MED ORDER — MIRTAZAPINE 15 MG PO TABS
15.0000 mg | ORAL_TABLET | Freq: Every day | ORAL | Status: DC
  Administered 2017-04-11 – 2017-04-15 (×5): 15 mg via ORAL
  Filled 2017-04-11 (×5): qty 1

## 2017-04-11 MED ORDER — NICOTINE 21 MG/24HR TD PT24
21.0000 mg | MEDICATED_PATCH | Freq: Every day | TRANSDERMAL | Status: DC
  Administered 2017-04-11: 11:00:00 21 mg via TRANSDERMAL
  Filled 2017-04-11: qty 1

## 2017-04-11 MED ORDER — GABAPENTIN 300 MG PO CAPS
900.0000 mg | ORAL_CAPSULE | Freq: Three times a day (TID) | ORAL | Status: DC
  Administered 2017-04-11 – 2017-04-16 (×16): 900 mg via ORAL
  Filled 2017-04-11 (×17): qty 3

## 2017-04-11 MED ORDER — VENLAFAXINE HCL ER 75 MG PO CP24
300.0000 mg | ORAL_CAPSULE | Freq: Every day | ORAL | Status: DC
  Administered 2017-04-12 – 2017-04-16 (×5): 300 mg via ORAL
  Filled 2017-04-11 (×5): qty 4

## 2017-04-11 MED ORDER — DOCUSATE SODIUM 100 MG PO CAPS
100.0000 mg | ORAL_CAPSULE | Freq: Two times a day (BID) | ORAL | Status: DC
  Administered 2017-04-11 – 2017-04-16 (×10): 100 mg via ORAL
  Filled 2017-04-11 (×10): qty 1

## 2017-04-11 NOTE — Progress Notes (Signed)
[facility] accompanied by staff and security personnel. Bo McclintockBrewer,Renae Mottley S, RN

## 2017-04-11 NOTE — H&P (Signed)
Psychiatric Admission Assessment Adult  Patient Identification: GAY MONCIVAIS MRN:  [id number] Date of Evaluation:  04/11/2017 Chief Complaint:  Depression Principal Diagnosis: Major depressive disorder, recurrent severe without psychotic features ([place]) Diagnosis:   Patient Active Problem List   Diagnosis Date Noted  . Major depressive disorder, recurrent severe without psychotic features ([place]) [F33.2] 04/11/2017  . Salicylate overdose [W09.811B] 04/09/2017  . AKI (acute kidney injury) ([place]) [N17.9] 04/03/2016  . Suicidal behavior [R46.89] 01/16/2016  . Alcohol intoxication ([place]) [F10.929]   . Severe episode of recurrent major depressive disorder, without psychotic features ([place]) [F33.2]   . Malingering [Z76.5] 12/16/2015  . Major depressive disorder, recurrent episode, moderate ([place]) [F33.1] 12/12/2015  . Closed pelvic fracture ([place]) [S32.9XXA] 10/31/2015  . Tobacco abuse [Z72.0] 10/31/2015  . Tobacco use disorder [F17.200] 03/28/2015  . PTSD (post-traumatic stress disorder) [F43.10] 03/28/2015  . Stimulant use disorder (cocaine) [F15.90] 03/28/2015  . Sedative, hypnotic or anxiolytic use disorder, severe, dependence ([place]) [F13.20] 03/27/2015  . Opioid use disorder, severe, dependence ([place]) [F11.20] 12/20/2014  . Alcohol use disorder severe. [F10.20] 12/18/2014  . Borderline personality disorder [F60.3] 12/18/2014   History of Present Illness:   Identifying data. Ms. Marlette is a 41 year old female with a history of depression, mood instability, and substance use.  Chief complaint. "I don't remember."  History of present illness. Information was obtained from the patient and the chart. The patient was brought to the emergency room by EMS with unsteady gait. She denies suicidal or homicidal ideation but was discovered to have elevated level of salicylates. She was briefly admitted to ICU. The patient does not remember overdosing reports that she has been lately very upset for not  able to see her 11-year-old daughter. She made arrangement with her ex-husband to have a child for several days in July but instead the child went with her mother. She's been ruminating over 4 weeks, because he became increasingly depressed and anxious, stopped taking her medications for 5 weeks, and ended up in the hospital. At the time of overdose she was drunk. She reports that she had a-year-old sobriety and started drinking on the night of admission because she was upset with her case worker. She reached out to [facility] peers support but indifferent worker was sent to talk to her and she ended up arguing with her peer. She reports poor sleep, decreased appetite, anhedonia, feeling of guilt and hopelessness worthlessness, poor energy and concentration, social isolation crying spells, high anxiety, and relapse on alcohol. She denies psychotic symptoms or symptoms suggestive of bipolar mania. She does not drink or use alcohol except for the time of admission. The patient states his that she's been doing very well for the past year participating in [facility] treatment, working with peers support, and Dr. Randel Books. When she takes medications, they are very helpful.  Past psychiatric history. The patient has a long history of depression, mood instability, suicide attempts, and multiple psychiatric hospitalizations. She has not been hospitalized in the past year and is very proud of it. She carries a diagnosis of depression, PTSD, and borderline personality disorder.  Family psychiatric history. Other family members with depression and anxiety. Social history. She lives independently in her apartment. She supports herself as a Scientist, water quality at [employer]. Her 15-year-old daughter lives with her father and the patient has only limited opportunity to see her. Last time she saw her daughter was in January 2018.  Total Time spent with patient: 1 hour  Is the patient at risk to self? Yes.  Has the patient been a risk to self in  the past 6 months? No.  Has the patient been a risk to self within the distant past? Yes.    Is the patient a risk to others? No.  Has the patient been a risk to others in the past 6 months? No.  Has the patient been a risk to others within the distant past? No.   Prior Inpatient Therapy:   Prior Outpatient Therapy:    Alcohol Screening: 1. How often do you have a drink containing alcohol?: 4 or more times a week 2. How many drinks containing alcohol do you have on a typical day when you are drinking?: 7, 8, or 9 3. How often do you have six or more drinks on one occasion?: Never Preliminary Score: 3 4. How often during the last year have you found that you were not able to stop drinking once you had started?: Never 5. How often during the last year have you failed to do what was normally expected from you becasue of drinking?: Never 6. How often during the last year have you needed a first drink in the morning to get yourself going after a heavy drinking session?: Never 7. How often during the last year have you had a feeling of guilt of remorse after drinking?: Never 8. How often during the last year have you been unable to remember what happened the night before because you had been drinking?: Never 9. Have you or someone else been injured as a result of your drinking?: No 10. Has a relative or friend or a doctor or another health worker been concerned about your drinking or suggested you cut down?: No Alcohol Use Disorder Identification Test Final Score (AUDIT): 7 Brief Intervention: AUDIT score less than 7 or less-screening does not suggest unhealthy drinking-brief intervention not indicated Substance Abuse History in the last 12 months:  Yes.   Consequences of Substance Abuse: Negative Previous Psychotropic Medications: Yes  Psychological Evaluations: No  Past Medical History:  Past Medical History:  Diagnosis Date  . Alcohol use disorder severe. 12/18/2014  . Anxiety   .  Borderline personality disorder 12/18/2014  . CVA (cerebral infarction)   . Depression   . 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 Opioid use disorder, severe, dependence ([place]) 12/20/2014  . PTSD (post-traumatic stress disorder) 03/28/2015  . Sedative, hypnotic or anxiolytic use disorder, severe, dependence ([place]) 03/27/2015  . Seizures ([place])   . Suicide attempt by hanging [facility])    History reviewed. No pertinent surgical history. Family History:  Family History  Problem Relation Age of Onset  . Diabetes Father    Tobacco Screening: Have you used any form of tobacco in the last 30 days? (Cigarettes, Smokeless Tobacco, Cigars, and/or Pipes): Yes Tobacco use, Select all that apply: 5 or more cigarettes per day Are you interested in Tobacco Cessation Medications?: Yes, will notify MD for an order Counseled patient on smoking cessation including recognizing danger situations, developing coping skills and basic information about quitting provided: Yes Social History:  History  Alcohol Use  . Yes     History  Drug Use No    Additional Social History:                           Allergies:   Allergies  Allergen Reactions  . Depakote [Valproic Acid] Anaphylaxis and Other (See Comments)    Reaction:  Seizures   .  Haldol [Haloperidol Lactate] Anaphylaxis and Other (See Comments)    Reaction:  Seizures   . Keflet [Cephalexin] Rash   Lab Results:  Results for orders placed or performed during the hospital encounter of 04/08/17 (from the past 48 hour(s))  Basic metabolic panel     Status: Abnormal   Collection Time: 04/11/17  7:47 AM  Result Value Ref Range   Sodium 140 135 - 145 mmol/L   Potassium 3.7 3.5 - 5.1 mmol/L   Chloride 109 101 - 111 mmol/L   CO2 25 22 - 32 mmol/L   Glucose, Bld 96 65 - 99 mg/dL   BUN 11 6 - 20 mg/dL   Creatinine, Ser 0.60 0.44 - 1.00 mg/dL   Calcium 8.7 (L) 8.9 - 10.3 mg/dL   GFR calc non Af Amer >60 >60  mL/min   GFR calc Af Amer >60 >60 mL/min    Comment: (NOTE) The eGFR has been calculated using the CKD EPI equation. This calculation has not been validated in all clinical situations. eGFR's persistently <60 mL/min signify possible Chronic Kidney Disease.    Anion gap 6 5 - 15    Blood Alcohol level:  Lab Results  Component Value Date   ETH 244 (H) 04/08/2017   ETH 181 (H) 53/61/4431    Metabolic Disorder Labs:  Lab Results  Component Value Date   HGBA1C 5.7 (H) 04/09/2017   MPG 116.89 04/09/2017   Lab Results  Component Value Date   PROLACTIN 98.1 (H) 12/12/2015   Lab Results  Component Value Date   CHOL 158 12/12/2015   TRIG 175 (H) 12/12/2015   HDL 34 (L) 12/12/2015   CHOLHDL 4.6 12/12/2015   VLDL 35 12/12/2015   LDLCALC 89 12/12/2015   LDLCALC 78 12/19/2014    Current Medications: Current Facility-Administered Medications  Medication Dose Route Frequency Provider Last Rate Last Dose  . acetaminophen (TYLENOL) tablet 650 mg  650 mg Oral Q6H PRN Clapacs, Madie Reno, MD   650 mg at 04/11/17 1425  . albuterol (PROVENTIL HFA;VENTOLIN HFA) 108 (90 Base) MCG/ACT inhaler 2 puff  2 puff Inhalation Q6H PRN Emanual Lamountain B, MD   2 puff at 04/11/17 1709  . alum & mag hydroxide-simeth (MAALOX/MYLANTA) 200-200-20 MG/5ML suspension 30 mL  30 mL Oral Q4H PRN Clapacs, John T, MD      . docusate sodium (COLACE) capsule 100 mg  100 mg Oral BID Clapacs, Madie Reno, MD   100 mg at 04/11/17 1709  . gabapentin (NEURONTIN) capsule 900 mg  900 mg Oral TID Clapacs, Madie Reno, MD   900 mg at 04/11/17 1708  . hydrOXYzine (ATARAX/VISTARIL) tablet 50 mg  50 mg Oral TID PRN Clapacs, Madie Reno, MD   50 mg at 04/11/17 1711  . magnesium hydroxide (MILK OF MAGNESIA) suspension 30 mL  30 mL Oral Daily PRN Clapacs, John T, MD      . mirtazapine (REMERON) tablet 15 mg  15 mg Oral QHS Clapacs, John T, MD      . prazosin (MINIPRESS) capsule 2 mg  2 mg Oral QHS Clapacs, John T, MD      . thiamine (VITAMIN  B-1) tablet 100 mg  100 mg Oral Daily Clapacs, John T, MD   100 mg at 04/11/17 1709  . [START ON 04/12/2017] venlafaxine XR (EFFEXOR-XR) 24 hr capsule 300 mg  300 mg Oral Q breakfast Clapacs, Madie Reno, MD       PTA Medications: Prescriptions Prior to Admission  Medication Sig Dispense  Refill Last Dose  . albuterol (PROVENTIL HFA;VENTOLIN HFA) 108 (90 Base) MCG/ACT inhaler Inhale 2 puffs into the lungs every 6 (six) hours as needed for wheezing or shortness of breath. 1 Inhaler 2   . dicyclomine (BENTYL) 20 MG tablet Take 1 tablet (20 mg total) by mouth 3 (three) times daily as needed (abdominal pain). (Patient not taking: Reported on 04/09/2017) 30 tablet 0 Not Taking at Unknown time  . famotidine (PEPCID) 20 MG tablet Take 20 mg by mouth 2 (two) times daily.     Marland Kitchen gabapentin (NEURONTIN) 300 MG capsule Take 3 capsules (900 mg total) by mouth 3 (three) times daily. (Patient taking differently: Take 300 mg by mouth 3 (three) times daily. ) 270 capsule 1 04/15/2016 at Unknown time  . lidocaine (LIDODERM) 5 % Place 2 patches onto the skin daily. Remove & Discard patch within 12 hours or as directed by MD (Patient not taking: Reported on 04/09/2017) 60 patch 0 Not Taking at Unknown time  . metoprolol tartrate (LOPRESSOR) 25 MG tablet Take 0.5 tablets (12.5 mg total) by mouth 2 (two) times daily. (Patient not taking: Reported on 04/09/2017) 60 tablet 0 Not Taking at Unknown time  . mirtazapine (REMERON) 15 MG tablet Take 1 tablet (15 mg total) by mouth at bedtime. 30 tablet 1 Past Month at Unknown time  . Multiple Vitamin (MULTIVITAMIN WITH MINERALS) TABS tablet Take 1 tablet by mouth daily. (Patient not taking: Reported on 04/16/2016) 30 tablet 0 Not Taking at Unknown time  . ondansetron (ZOFRAN ODT) 4 MG disintegrating tablet Take 1 tablet (4 mg total) by mouth every 8 (eight) hours as needed for nausea or vomiting. (Patient not taking: Reported on 04/09/2017) 20 tablet 0 Not Taking at Unknown time  . ondansetron  (ZOFRAN) 4 MG tablet Take 1 tablet (4 mg total) by mouth every 8 (eight) hours as needed for nausea or vomiting. (Patient not taking: Reported on 04/09/2017) 20 tablet 0 Not Taking at Unknown time  . prazosin (MINIPRESS) 2 MG capsule Take 1 capsule (2 mg total) by mouth at bedtime. (Patient not taking: Reported on 04/09/2017) 30 capsule 1 Not Taking at Unknown time  . promethazine (PHENERGAN) 12.5 MG tablet Take 1 tablet (12.5 mg total) by mouth every 6 (six) hours as needed for nausea or vomiting. (Patient not taking: Reported on 04/16/2016) 30 tablet 0 Not Taking at Unknown time  . venlafaxine XR (EFFEXOR-XR) 150 MG 24 hr capsule Take 2 capsules (300 mg total) by mouth daily with breakfast. 60 capsule 1 Past Month at Unknown time    Musculoskeletal: Strength & Muscle Tone: within normal limits Gait & Station: normal Patient leans: N/A  Psychiatric Specialty Exam:  I reviewed physical examination performed in the emergency room and agree with the findings. Physical Exam  Nursing note and vitals reviewed. Psychiatric: Her speech is normal and behavior is normal. Her mood appears anxious. Cognition and memory are normal. She exhibits a depressed mood. She expresses suicidal ideation. She expresses suicidal plans.    Review of Systems  Psychiatric/Behavioral: Positive for depression, substance abuse and suicidal ideas. The patient is nervous/anxious.   All other systems reviewed and are negative.   Blood pressure 129/77, pulse 83, temperature 98.4 F (36.9 C), temperature source Oral, resp. rate 18, height '5\' 8"'  (1.727 m), weight 96.6 kg (213 lb), SpO2 100 %.Body mass index is 32.39 kg/m.  See SRA.  Sleep:       Treatment Plan Summary: Daily contact with patient to assess and evaluate symptoms and progress in treatment and Medication management   Ms. Heiss is a 41 year old female with a history of depression admitted  after overdose on in the context of relapse on alcohol and medication noncompliance.  1. Suicidal ideation. The patient is able to contract for safety in the hospital.  2. Mood. She has been maintained on a combination of Effexor and Remeron.  3. PTSD. She is on Minipress at night for nightmares and Neurontin and Vistaril for anxiety.   4. COPD. Albuterol is available.   5. Constipation. pain. She is on Colace.   6. Metabolic syndrome monitoring. Lipid panel, TSH, hemoglobin A1c are pending.   7. EKG. Pending.  8. Disposition. She will be discharged to her apartment. She will follow up with Dr. [facility]el [facility] at [facility].   Observation Level/Precautions:  15 minute checks  Laboratory:  CBC Chemistry Profile UDS UA  Psychotherapy:    Medications:    Consultations:    Discharge Concerns:    Estimated LOS:  Other:     Physician Treatment Plan for Primary Diagnosis: Major depressive disorder, recurrent severe without psychotic features ([place]) Long Term Goal(s): Improvement in symptoms so as ready for discharge  Short Term Goals: Ability to identify changes in lifestyle to reduce recurrence of condition will improve, Ability to verbalize feelings will improve, Ability to disclose and discuss suicidal ideas, Ability to demonstrate self-control will improve, Ability to identify and develop effective coping behaviors will improve, Ability to maintain clinical measurements within normal limits will improve, Compliance with prescribed medications will improve and Ability to identify triggers associated with substance abuse/mental health issues will improve  Physician Treatment Plan for Secondary Diagnosis: Principal Problem:   Major depressive disorder, recurrent severe without psychotic features ([place]) Active Problems:   Borderline personality disorder   Tobacco use disorder   PTSD (post-traumatic stress disorder)   Alcohol intoxication ([place])   Salicylate overdose  Long Term Goal(s):  Improvement in symptoms so as ready for discharge  Short Term Goals: Ability to identify changes in lifestyle to reduce recurrence of condition will improve, Ability to demonstrate self-control will improve and Ability to identify triggers associated with substance abuse/mental health issues will improve  I certify that inpatient services furnished can reasonably be expected to improve the patient's condition.    Orson Slick, MD 9/13/20185:48 PM

## 2017-04-11 NOTE — [facility] Suicide Risk Assessment (Signed)
[facility]   Nursing information obtained from:    Demographic factors:    Current Mental Status:    Loss Factors:    Historical Factors:    Risk Reduction Factors:     Total Time spent with patient: 1 hour Principal Problem: Major depressive disorder, recurrent severe without psychotic features ([facility]) Diagnosis:   Patient Active Problem List   Diagnosis Date Noted  . Major depressive disorder, recurrent severe without psychotic features ([facility]) [F33.2] 04/11/2017  . Salicylate overdose [T39.091A] 04/09/2017  . AKI (acute kidney injury) ([facility]) [N17.9] 04/03/2016  . Suicidal behavior [R46.89] 01/16/2016  . Alcohol intoxication ([facility]) [F10.929]   . Severe episode of recurrent major depressive disorder, without psychotic features ([facility]) [F33.2]   . Malingering [Z76.5] 12/16/2015  . Major depressive disorder, recurrent episode, moderate ([facility]) [F33.1] 12/12/2015  . Closed pelvic fracture ([facility]) [S32.9XXA] 10/31/2015  . Tobacco abuse [Z72.0] 10/31/2015  . Tobacco use disorder [F17.200] 03/28/2015  . PTSD (post-traumatic stress disorder) [F43.10] 03/28/2015  . Stimulant use disorder (cocaine) [F15.90] 03/28/2015  . Sedative, hypnotic or anxiolytic use disorder, severe, dependence (HCC) [F13.20] 03/27/2015  . Opioid use disorder, severe, dependence ([facility]) [F11.20] 12/20/2014  . Alcohol use disorder severe. [F10.20] 12/18/2014  . Borderline personality disorder [F60.3] 12/18/2014   Subjective Data: overdose.  Continued Clinical Symptoms:  Alcohol Use Disorder Identification Test Final Score (AUDIT): 7 The "Alcohol Use Disorders Identification Test", Guidelines for Use in Primary Care, Second Edition.  World Science writerHealth Organization [facility](WHO). Score between 0-7:  no or low risk or alcohol related problems. Score between 8-15:  moderate risk of alcohol related problems. Score between 16-19:  high risk of alcohol related problems. Score 20 or above:  warrants further diagnostic  evaluation for alcohol dependence and treatment.   CLINICAL FACTORS:   Severe Anxiety and/or Agitation Depression:   Comorbid alcohol abuse/dependence Impulsivity Alcohol/Substance Abuse/Dependencies   Musculoskeletal: Strength & Muscle Tone: within normal limits Gait & Station: normal Patient leans: N/A  Psychiatric Specialty Exam: Physical Exam  Nursing note and vitals reviewed. Psychiatric: Her speech is normal and behavior is normal. Cognition and memory are normal. She expresses impulsivity. She exhibits a depressed mood. She expresses suicidal ideation. She expresses suicidal plans.    Review of Systems  Psychiatric/Behavioral: Positive for depression, substance abuse and suicidal ideas. The patient is nervous/anxious and has insomnia.   All other systems reviewed and are negative.   Blood pressure 129/77, pulse 83, temperature 98.4 F (36.9 C), temperature source Oral, resp. rate 18, height 5\' 8"  (1.727 m), weight 96.6 kg (213 lb), SpO2 100 %.Body mass index is 32.39 kg/m.  General Appearance: Casual  Eye Contact:  Good  Speech:  Clear and Coherent  Volume:  Normal  Mood:  Depressed and Dysphoric  Affect:  Blunt  Thought Process:  Goal Directed and Descriptions of Associations: Intact  Orientation:  Full (Time, Place, and Person)  Thought Content:  WDL  Suicidal Thoughts:  No  Homicidal Thoughts:  No  Memory:  Immediate;   Fair Recent;   Fair Remote;   Fair  Judgement:  Impaired  Insight:  Lacking and Present  Psychomotor Activity:  Psychomotor Retardation  Concentration:  Concentration: Fair and Attention Span: Fair  Recall:  FiservFair  Fund of Knowledge:  Fair  Language:  Fair  Akathisia:  No  Handed:  Right  AIMS (if indicated):     Assets:  Communication Skills Desire for Improvement Financial Resources/Insurance Housing Physical Health Resilience Vocational/Educational  ADL's:  Intact  Cognition:  WNL  Sleep:         COGNITIVE FEATURES THAT  CONTRIBUTE TO RISK:  None    SUICIDE RISK:   Moderate:  Frequent suicidal ideation with limited intensity, and duration, some specificity in terms of plans, no associated intent, good self-control, limited dysphoria/symptomatology, some risk factors present, and identifiable protective factors, including available and accessible social support.  PLAN OF CARE: Hospital admission, medication management, substance abuse counseling, discharge planning.  Sandy Mason is a 41 year old female with a history of depression admitted after overdose on in the context of relapse on alcohol and medication noncompliance.  1. Suicidal ideation. The patient is able to contract for safety in the hospital.  2. Mood. She has been maintained on a combination of Effexor and Remeron.  3. PTSD. She is on Minipress at night for nightmares and Neurontin and Vistaril for anxiety.   4. COPD. Albuterol is available.   5. Constipation. pain. She is on Colace.   6. Metabolic syndrome monitoring. Lipid panel, TSH, hemoglobin A1c are pending.   7. EKG. Pending.  8. Disposition. She will be discharged to her apartment. She will follow up with Dr. Suzie Portela at [facility].  I certify that inpatient services furnished can reasonably be expected to improve the patient's condition.   Kristine Linea, MD 04/11/2017, 5:33 PM

## 2017-04-11 NOTE — Progress Notes (Signed)
Patient pleasant and cooperative during admission assessment. Patient denies SI/HI at this time. Patient denies AVH. Patient informed of fall risk status, fall risk assessed "low" at this time. Patient oriented to unit/staff/room. Patient denies any questions/concerns at this time. Patient safe on unit with Q15 minute checks for safety. Skin assessment & body search done ,no contraband found. 

## 2017-04-11 NOTE — Discharge Summary (Signed)
[facility] - [place] at [facility], 41 y.o., DOB 03/25/1976, MRN [id number]. Admission date: 04/08/2017 Discharge Date 04/11/2017 Primary MD [facility] Admitting Physician Arnaldo Natal, MD  Admission Diagnosis  Suicidal ideation [R45.851] Alcoholic intoxication without complication (HCC) [F10.920] Salicylate overdose, undetermined intent, initial encounter [T39.094A]  Discharge Diagnosis   Principal Problem:   Severe episode of recurrent major depressive disorder, without psychotic features (HCC)    Alcohol use disorder severe.   PTSD (post-traumatic stress disorder)   Salicylate overdose  substance abuse in past        Hospital Course   Patient is a 41 year old female who has long history of mental health problems. She states that a few weeks ago she started developing stress. And intermittently she started drinking more. She has been getting more anxious. And she decided to overdose on salicylates. Patient was noted to have elevated salicylate levels therefore was admitted to the ICU. Given IV bicarbonate drip. Patient had no adverse reaction from salicylate overdose. She has been psych seen by psychiatry who recommends inpatient  psychiatric admission for continued mental health issues.           Consults  pych  Significant Tests:  See full reports for all details     Dg Chest 2 View  Result Date: 03/19/2017 CLINICAL DATA:  Chest pain beginning today.  Anxiety. EXAM: CHEST  2 VIEW COMPARISON:  08/22/2016 FINDINGS: The heart size and mediastinal contours are within normal limits. New asymmetric opacity is seen in the left lower lung with partial silhouetting of the left heart border, suspicious for pneumonia. Right lung is clear. No evidence of pleural effusion. IMPRESSION: Asymmetric opacity in lingula, suspicious for pneumonia. Recommend clinical correlation; consider followup PA and lateral chest X-ray in 3-4 weeks  following trial of antibiotic therapy to ensure resolution . Electronically Signed   By: Myles Rosenthal M.D.   On: 03/19/2017 18:20       Today   Subjective:   Sandy Mason states that she was anxious last night felt like her heart was beating fast and had some chest pressure. This morning he denies any of those symptoms  Objective:   Blood pressure 104/81, pulse 77, temperature 98.5 F (36.9 C), temperature source Oral, resp. rate 18, height  (1.676 m), weight 207 lb (93.9 kg), SpO2 97 %.  .  Intake/Output Summary (Last 24 hours) at 04/11/17 0739 Last data filed at 04/10/17 1800  Gross per 24 hour  Intake              600 ml  Output                0 ml  Net              600 ml    Exam VITAL SIGNS: Blood pressure 104/81, pulse 77, temperature 98.5 F (36.9 C), temperature source Oral, resp. rate 18, height  (1.676 m), weight 207 lb (93.9 kg), SpO2 97 %.  GENERAL:  41 y.o.-year-old patient lying in the bed with no acute distress.  EYES: Pupils equal, round, reactive to light and accommodation. No scleral icterus. Extraocular muscles intact.  HEENT: Head atraumatic, normocephalic. Oropharynx and nasopharynx clear.  NECK:  Supple, no jugular venous distention. No thyroid enlargement, no tenderness.  LUNGS: Normal breath sounds bilaterally, no wheezing, rales,rhonchi or crepitation. No use of accessory muscles of respiration.  CARDIOVASCULAR: S1, S2 normal. No murmurs, rubs, or gallops.  ABDOMEN:  Soft, nontender, nondistended. Bowel sounds present. No organomegaly or mass.  EXTREMITIES: No pedal edema, cyanosis, or clubbing.  NEUROLOGIC: Cranial nerves II through XII are intact. Muscle strength 5/5 in all extremities. Sensation intact. Gait not checked.  PSYCHIATRIC: The patient is alert and oriented x 3.  SKIN: No obvious rash, lesion, or ulcer.   Data Review     CBC w Diff: Lab Results  Component Value Date   WBC 5.9 04/09/2017   HGB 12.0 04/09/2017   HGB 13.4  10/30/2014   HCT 34.6 (L) 04/09/2017   HCT 39.7 10/30/2014   PLT 282 04/09/2017   PLT 337 10/30/2014   LYMPHOPCT 73 04/09/2017   LYMPHOPCT 11.8 07/10/2014   BANDSPCT 0 04/09/2017   MONOPCT 4 04/09/2017   MONOPCT 4.6 07/10/2014   EOSPCT 3 04/09/2017   EOSPCT 0.1 07/10/2014   BASOPCT 0 04/09/2017   BASOPCT 0.6 07/10/2014   CMP: Lab Results  Component Value Date   NA 142 04/09/2017   NA 141 10/30/2014   K 3.1 (L) 04/09/2017   K 3.4 (L) 10/30/2014   CL 110 04/09/2017   CL 109 10/30/2014   CO2 23 04/09/2017   CO2 26 10/30/2014   BUN 11 04/09/2017   BUN 6 10/30/2014   CREATININE 0.69 04/09/2017   CREATININE 0.53 10/30/2014   PROT 8.0 04/08/2017   PROT 7.3 10/30/2014   ALBUMIN 4.1 04/08/2017   ALBUMIN 4.2 10/30/2014   BILITOT 0.3 04/08/2017   BILITOT 0.4 10/30/2014   ALKPHOS 49 04/08/2017   ALKPHOS 45 10/30/2014   AST 31 04/08/2017   AST 19 10/30/2014   ALT 25 04/08/2017   ALT 18 10/30/2014  .  Micro Results Recent Results (from the past 240 hour(s))  MRSA PCR Screening     Status: None   Collection Time: 04/09/17  4:41 AM  Result Value Ref Range Status   MRSA by PCR NEGATIVE NEGATIVE Final    Comment:        The GeneXpert MRSA Assay (FDA approved for NASAL specimens only), is one component of a comprehensive MRSA colonization surveillance program. It is not intended to diagnose MRSA infection nor to guide or monitor treatment for MRSA infections.         Code Status Orders        Start     Ordered   04/09/17 0354  Full code  Continuous     04/09/17 0353    Code Status History    Date Active Date Inactive Code Status Order ID Comments User Context   04/03/2016 11:49 PM 04/05/2016  5:33 PM Full Code [id number]  Hugelmeyer, Jon Gills, DO Inpatient   12/25/2015  8:43 PM 01/09/2016  6:50 PM Full Code [id number]  Jeanmarie Plant, MD ED   12/12/2015 12:56 AM 12/12/2015 12:56 AM Full Code [id number]  Audery Amel, MD Inpatient   12/12/2015 12:56 AM 12/16/2015  7:56  PM Full Code [id number]  Audery Amel, MD Inpatient   12/10/2015 10:25 AM 12/12/2015 12:56 AM Full Code [id number]  Adrian Saran, MD Inpatient   10/31/2015  9:49 PM 11/01/2015  7:15 PM Full Code [id number]  Robley Fries, MD ED   03/27/2015  1:59 AM 03/29/2015  1:20 PM Full Code [id number]  Beau Fanny, MD Inpatient   12/19/2014  6:28 PM 12/23/2014  4:30 PM Full Code [id number]  Audery Amel, MD Inpatient   12/17/2014  9:16 PM 12/19/2014  6:28 PM Full Code [id number]  Katha Hamming, MD Inpatient  Follow-up Information    [place] Follow up in 3 week(s).   Contact information: [address] [place] [address] [place] [id number] [phone]           Discharge Medications   Allergies as of 04/11/2017      Reactions   Depakote [valproic Acid] Anaphylaxis, Other (See Comments)   Reaction:  Seizures    Haldol [haloperidol Lactate] Anaphylaxis, Other (See Comments)   Reaction:  Seizures    Keflet [cephalexin] Rash      Medication List    STOP taking these medications   diazepam 5 MG tablet Commonly known as:  VALIUM   hydrOXYzine 25 MG capsule Commonly known as:  VISTARIL   levofloxacin 250 MG tablet Commonly known as:  LEVAQUIN   meloxicam 7.5 MG tablet Commonly known as:  MOBIC   naproxen 500 MG tablet Commonly known as:  NAPROSYN   sulfamethoxazole-trimethoprim 800-160 MG tablet Commonly known as:  BACTRIM DS     TAKE these medications   albuterol 108 (90 Base) MCG/ACT inhaler Commonly known as:  PROVENTIL HFA;VENTOLIN HFA Inhale 2 puffs into the lungs every 6 (six) hours as needed for wheezing or shortness of breath.   dicyclomine 20 MG tablet Commonly known as:  BENTYL Take 1 tablet (20 mg total) by mouth 3 (three) times daily as needed (abdominal pain).   famotidine 20 MG tablet Commonly known as:  PEPCID Take 20 mg by mouth 2 (two) times daily.   gabapentin 300 MG capsule Commonly known as:  NEURONTIN Take 3 capsules (900 mg total)  by mouth 3 (three) times daily. What changed:  how much to take   lidocaine 5 % Commonly known as:  LIDODERM Place 2 patches onto the skin daily. Remove & Discard patch within 12 hours or as directed by MD   metoprolol tartrate 25 MG tablet Commonly known as:  LOPRESSOR Take 0.5 tablets (12.5 mg total) by mouth 2 (two) times daily.   mirtazapine 15 MG tablet Commonly known as:  REMERON Take 1 tablet (15 mg total) by mouth at bedtime.   multivitamin with minerals Tabs tablet Take 1 tablet by mouth daily.   ondansetron 4 MG disintegrating tablet Commonly known as:  ZOFRAN ODT Take 1 tablet (4 mg total) by mouth every 8 (eight) hours as needed for nausea or vomiting.   ondansetron 4 MG tablet Commonly known as:  ZOFRAN Take 1 tablet (4 mg total) by mouth every 8 (eight) hours as needed for nausea or vomiting.   prazosin 2 MG capsule Commonly known as:  MINIPRESS Take 1 capsule (2 mg total) by mouth at bedtime.   promethazine 12.5 MG tablet Commonly known as:  PHENERGAN Take 1 tablet (12.5 mg total) by mouth every 6 (six) hours as needed for nausea or vomiting.   venlafaxine XR 150 MG 24 hr capsule Commonly known as:  EFFEXOR-XR Take 2 capsules (300 mg total) by mouth daily with breakfast.          Total Time in preparing paper work, data evaluation and todays exam - 35 minutes  Auburn BilberryPATEL, Tekela Garguilo M.D on 04/11/2017 at 7:39 AM  [facility]   Office  [phone]

## 2017-04-11 NOTE — Discharge Instructions (Signed)
[facility] - [place] at [facility]  DIET:  Cardiac diet  DISCHARGE CONDITION:  Stable  ACTIVITY:  Activity as tolerated  OXYGEN:  Home Oxygen: No.   Oxygen Delivery: room air  DISCHARGE LOCATION:  [facility]    ADDITIONAL DISCHARGE INSTRUCTION:   If you experience worsening of your admission symptoms, develop shortness of breath, life threatening emergency, suicidal or homicidal thoughts you must seek medical attention immediately by calling 911 or calling your MD immediately  if symptoms less severe.  You Must read complete instructions/literature along with all the possible adverse reactions/side effects for all the Medicines you take and that have been prescribed to you. Take any new Medicines after you have completely understood and accpet all the possible adverse reactions/side effects.   Please note  You were cared for by a hospitalist during your hospital stay. If you have any questions about your discharge medications or the care you received while you were in the hospital after you are discharged, you can call the unit and asked to speak with the hospitalist on call if the hospitalist that took care of you is not available. Once you are discharged, your primary care physician will handle any further medical issues. Please note that NO REFILLS for any discharge medications will be authorized once you are discharged, as it is imperative that you return to your primary care physician (or establish a relationship with a primary care physician if you do not have one) for your aftercare needs so that they can reassess your need for medications and monitor your lab values.

## 2017-04-11 NOTE — [facility] Group Notes (Signed)
[facility] Group Notes:  (Nursing/MHT/Case Management/Adjunct)  Date:  04/11/2017  Time:  9:37 PM  Type of Therapy:  Group Therapy  Participation Level:  Active  Participation Quality:  Appropriate  Affect:  Appropriate  Cognitive:  Alert  Insight:  Good  Engagement in Group:  Engaged  Modes of Intervention:  Support  Summary of Progress/Problems:  Mayra NeerJackie L [name] 04/11/2017, 9:37 PM

## 2017-04-11 NOTE — [facility] Group Notes (Signed)
[facility] Group Notes:  (Nursing/MHT/Case Management/Adjunct)  Date:  04/11/2017  Time:  4:22 PM  Type of Therapy:  Psychoeducational Skills  Participation Level:  Active  Participation Quality:  Appropriate, Attentive, Sharing and Supportive  Affect:  Flat  Cognitive:  Appropriate  Insight:  Appropriate  Engagement in Group:  Engaged and Supportive  Modes of Intervention:  Discussion and Education  Summary of Progress/Problems:  Sandy Mason 04/11/2017, 4:22 PM

## 2017-04-11 NOTE — BH Assessment (Signed)
Pt assigned to [facility] by [employer] RN Phyllis. Pt access (Raina) informed of pt admission for pre-admit.  Pt RN Fabian November(Shae) informed.

## 2017-04-11 NOTE — BH Assessment (Signed)
Pt pending BMU bed assignment.

## 2017-04-12 LAB — LIPID PANEL
CHOL/HDL RATIO: 4.6 ratio
Cholesterol: 178 mg/dL (ref 0–200)
HDL: 39 mg/dL — AB (ref >40)
LDL CALC: 80 mg/dL (ref 0–99)
Triglycerides: 297 mg/dL — ABNORMAL HIGH (ref <150)
VLDL: 59 mg/dL — AB (ref 0–40)

## 2017-04-12 LAB — T4, FREE: Free T4: 0.67 ng/dL (ref 0.61–1.12)

## 2017-04-12 LAB — HEMOGLOBIN A1C
Hgb A1c MFr Bld: 5.8 % — ABNORMAL HIGH (ref 4.8–5.6)
Mean Plasma Glucose: 119.76 mg/dL

## 2017-04-12 LAB — TSH: TSH: 9.173 u[IU]/mL — ABNORMAL HIGH (ref 0.350–4.500)

## 2017-04-12 MED ORDER — SUMATRIPTAN SUCCINATE 50 MG PO TABS
50.0000 mg | ORAL_TABLET | ORAL | Status: DC | PRN
  Administered 2017-04-12 – 2017-04-16 (×7): 50 mg via ORAL
  Filled 2017-04-12 (×7): qty 1

## 2017-04-12 MED ORDER — NICOTINE 21 MG/24HR TD PT24
21.0000 mg | MEDICATED_PATCH | Freq: Every day | TRANSDERMAL | Status: DC
  Administered 2017-04-12 – 2017-04-16 (×5): 21 mg via TRANSDERMAL
  Filled 2017-04-12 (×4): qty 1

## 2017-04-12 MED ORDER — QUETIAPINE FUMARATE 25 MG PO TABS
25.0000 mg | ORAL_TABLET | Freq: Three times a day (TID) | ORAL | Status: DC
  Administered 2017-04-12 – 2017-04-15 (×9): 25 mg via ORAL
  Filled 2017-04-12 (×9): qty 1
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
## 2017-04-12 NOTE — Plan of Care (Signed)
Problem: Safety: Goal: Ability to disclose and discuss suicidal ideas will improve Outcome: Progressing Denies SI   

## 2017-04-12 NOTE — [facility] Group Notes (Signed)
[facility] Group Notes:  (Nursing/MHT/Case Management/Adjunct)  Date:  04/12/2017  Time:  2:17 PM  Type of Therapy:  Psychoeducational Skills  Participation Level:  Active  Participation Quality:  Appropriate, Attentive, Sharing and Supportive  Affect:  Appropriate and Excited  Cognitive:  Appropriate  Insight:  Appropriate  Engagement in Group:  Engaged and Supportive  Modes of Intervention:  Activity, Discussion, Education, Socialization and Support  Summary of Progress/Problems:  Sandy Mason 04/12/2017, 2:17 PM

## 2017-04-12 NOTE — Progress Notes (Signed)
Patient remains anxious, writer talked to patient about using various coping skills.  Visible in the milieu, interacting with peers and staff appropriately. Cooperative with treatment regiment still exhibits med seeking tendencies at times. Cooperative with medication regime. She is currently in dayroom watching t.v. At this time.

## 2017-04-12 NOTE — Progress Notes (Signed)
[facility] MD Progress Note  04/12/2017 9:41 AM Sandy Mason  MRN:  [id number]  Subjective:  Sandy Mason is a 41 year old female with history of depression, mood instability, and borderline personality disorder admitted after overdose while drunk. We continue her medications as prescribed by Dr.Moffit. No benzos.   04/12/2017. Sandy Mason met with treatment team today. She complains of severe anxiety. She did not associate heightening of anxiety with medication noncompliance. She seems to understand it now. She still tries to extra benzodiazepines from m there is a history of benzodiazepine abuse. She complains of migraine headache. She did not sleep well last night experiencing dreams but not PTSD related nightmares. She is very eager to participate in programming but states that she has not been able to think about discharge planning. She did call her job. She still feels very depressed. she told us in treatment team about beams in her walking closet that she would use for hanging. She does have the rope.  Per nursing: Patient complained of anxiety during the shift.  She does not feel the Vistaril is effective.  She reports having more success with Ativan on previous occasions.    Principal Problem: Major depressive disorder, recurrent severe without psychotic features ([place]) Diagnosis:   Patient Active Problem List   Diagnosis Date Noted  . Major depressive disorder, recurrent severe without psychotic features ([place]) [F33.2] 04/11/2017  . Salicylate overdose [W09.811B] 04/09/2017  . AKI (acute kidney injury) ([place]) [N17.9] 04/03/2016  . Suicidal behavior [R46.89] 01/16/2016  . Alcohol intoxication ([place]) [F10.929]   . Severe episode of recurrent major depressive disorder, without psychotic features ([place]) [F33.2]   . Malingering [Z76.5] 12/16/2015  . Major depressive disorder, recurrent episode, moderate ([place]) [F33.1] 12/12/2015  . Closed pelvic fracture ([place]) [S32.9XXA] 10/31/2015  . Tobacco abuse  [Z72.0] 10/31/2015  . Tobacco use disorder [F17.200] 03/28/2015  . PTSD (post-traumatic stress disorder) [F43.10] 03/28/2015  . Stimulant use disorder (cocaine) [F15.90] 03/28/2015  . Sedative, hypnotic or anxiolytic use disorder, severe, dependence ([place]) [F13.20] 03/27/2015  . Opioid use disorder, severe, dependence ([place]) [F11.20] 12/20/2014  . Alcohol use disorder severe. [F10.20] 12/18/2014  . Borderline personality disorder [F60.3] 12/18/2014   Total Time spent with patient: 30 minutes  Past Psychiatric History: Depression, mood instability, and substance abuse.  Past Medical History:  Past Medical History:  Diagnosis Date  . Alcohol use disorder severe. 12/18/2014  . Anxiety   . Borderline personality disorder 12/18/2014  . CVA (cerebral infarction)   . Depression   . 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 Opioid use disorder, severe, dependence ([place]) 12/20/2014  . PTSD (post-traumatic stress disorder) 03/28/2015  . Sedative, hypnotic or anxiolytic use disorder, severe, dependence ([place]) 03/27/2015  . Seizures ([place])   . Suicide attempt by hanging [facility])    History reviewed. No pertinent surgical history. Family History:  Family History  Problem Relation Age of Onset  . Diabetes Father    Family Psychiatric  History: See H&P.  Social History:  History  Alcohol Use  . Yes     History  Drug Use No    Social History   Social History  . Marital status: Single    Spouse name: N/A  . Number of children: N/A  . Years of education: N/A   Social History Main Topics  . Smoking status: Current Every Day Smoker    Packs/day: 1.00    Types: Cigarettes  . Smokeless tobacco: Never Used  . Alcohol use Yes  .  Drug use: No  . Sexual activity: Yes    Birth control/ protection: IUD   Other Topics Concern  . None   Social History Narrative  . None   Additional Social History:                         Sleep:  Fair  Appetite:  Fair  Current Medications: Current Facility-Administered Medications  Medication Dose Route Frequency Provider Last Rate Last Dose  . acetaminophen (TYLENOL) tablet 650 mg  650 mg Oral Q6H PRN Clapacs, Madie Reno, MD   650 mg at 04/12/17 0840  . albuterol (PROVENTIL HFA;VENTOLIN HFA) 108 (90 Base) MCG/ACT inhaler 2 puff  2 puff Inhalation Q6H PRN Jamaury Gumz B, MD   2 puff at 04/11/17 1709  . alum & mag hydroxide-simeth (MAALOX/MYLANTA) 200-200-20 MG/5ML suspension 30 mL  30 mL Oral Q4H PRN Clapacs, John T, MD      . docusate sodium (COLACE) capsule 100 mg  100 mg Oral BID Clapacs, Madie Reno, MD   100 mg at 04/12/17 0840  . gabapentin (NEURONTIN) capsule 900 mg  900 mg Oral TID Clapacs, Madie Reno, MD   900 mg at 04/12/17 0841  . hydrOXYzine (ATARAX/VISTARIL) tablet 50 mg  50 mg Oral TID PRN Clapacs, Madie Reno, MD   50 mg at 04/12/17 0331  . magnesium hydroxide (MILK OF MAGNESIA) suspension 30 mL  30 mL Oral Daily PRN Clapacs, John T, MD      . mirtazapine (REMERON) tablet 15 mg  15 mg Oral QHS Clapacs, Madie Reno, MD   15 mg at 04/11/17 2113  . prazosin (MINIPRESS) capsule 2 mg  2 mg Oral QHS Clapacs, Madie Reno, MD   2 mg at 04/11/17 2113  . SUMAtriptan (IMITREX) tablet 50 mg  50 mg Oral Q2H PRN Dwan Hemmelgarn B, MD      . thiamine (VITAMIN B-1) tablet 100 mg  100 mg Oral Daily Clapacs, Madie Reno, MD   100 mg at 04/12/17 0841  . venlafaxine XR (EFFEXOR-XR) 24 hr capsule 300 mg  300 mg Oral Q breakfast Clapacs, Madie Reno, MD   300 mg at 04/12/17 0840    Lab Results:  Results for orders placed or performed during the hospital encounter of 04/11/17 (from the past 48 hour(s))  Lipid panel     Status: Abnormal   Collection Time: 04/12/17  7:04 AM  Result Value Ref Range   Cholesterol 178 0 - 200 mg/dL   Triglycerides 297 (H) <150 mg/dL   HDL 39 (L) >40 mg/dL   Total CHOL/HDL Ratio 4.6 RATIO   VLDL 59 (H) 0 - 40 mg/dL   LDL Cholesterol 80 0 - 99 mg/dL    Comment:        Total  Cholesterol/HDL:CHD Risk Coronary Heart Disease Risk Table                     Men   Women  1/2 Average Risk   3.4   3.3  Average Risk       5.0   4.4  2 X Average Risk   9.6   7.1  3 X Average Risk  23.4   11.0        Use the calculated Patient Ratio above and the CHD Risk Table to determine the patient's CHD Risk.        ATP III CLASSIFICATION (LDL):  <100     mg/dL  Optimal  100-129  mg/dL   Near or Above                    Optimal  130-159  mg/dL   Borderline  160-189  mg/dL   High  >190     mg/dL   Very High   TSH     Status: Abnormal   Collection Time: 04/12/17  7:04 AM  Result Value Ref Range   TSH 9.173 (H) 0.350 - 4.500 uIU/mL    Comment: Performed by a 3rd Generation assay with a functional sensitivity of <=0.01 uIU/mL.    Blood Alcohol level:  Lab Results  Component Value Date   ETH 244 (H) 04/08/2017   ETH 181 (H) 83/33/8329    Metabolic Disorder Labs: Lab Results  Component Value Date   HGBA1C 5.7 (H) 04/09/2017   MPG 116.89 04/09/2017   Lab Results  Component Value Date   PROLACTIN 98.1 (H) 12/12/2015   Lab Results  Component Value Date   CHOL 178 04/12/2017   TRIG 297 (H) 04/12/2017   HDL 39 (L) 04/12/2017   CHOLHDL 4.6 04/12/2017   VLDL 59 (H) 04/12/2017   LDLCALC 80 04/12/2017   LDLCALC 89 12/12/2015    Physical Findings: AIMS:  , ,  ,  ,    CIWA:  CIWA-Ar Total: 1 COWS:     Musculoskeletal: Strength & Muscle Tone: within normal limits Gait & Station: normal Patient leans: N/A  Psychiatric Specialty Exam: Physical Exam  Nursing note and vitals reviewed. Psychiatric: Her speech is normal and behavior is normal. Her mood appears anxious. Cognition and memory are normal. She expresses impulsivity. She expresses suicidal ideation. She expresses suicidal plans.    Review of Systems  Psychiatric/Behavioral: Positive for depression, substance abuse and suicidal ideas. The patient is nervous/anxious and has insomnia.   All other  systems reviewed and are negative.   Blood pressure 97/72, pulse 79, temperature 97.7 F (36.5 C), temperature source Oral, resp. rate 18, height _0  (1.727 m), weight 96.6 kg (213 lb), SpO2 100 %.Body mass index is 32.39 kg/m.  General Appearance: Casual  Eye Contact:  Good  Speech:  Clear and Coherent  Volume:  Normal  Mood:  Anxious  Affect:  Blunt  Thought Process:  Goal Directed and Descriptions of Associations: Intact  Orientation:  Full (Time, Place, and Person)  Thought Content:  WDL  Suicidal Thoughts:  Yes.  with intent/plan  Homicidal Thoughts:  No  Memory:  Immediate;   Fair Recent;   Fair Remote;   Fair  Judgement:  Poor  Insight:  Shallow  Psychomotor Activity:  Normal  Concentration:  Concentration: Fair and Attention Span: Fair  Recall:  [employer] of Knowledge:  Fair  Language:  Fair  Akathisia:  No  Handed:  Right  AIMS (if indicated):     Assets:  Communication Skills Desire for Improvement Housing Physical Health Resilience Social Support Vocational/Educational  ADL's:  Intact  Cognition:  WNL  Sleep:  Number of Hours: 6.25     Treatment Plan Summary: Daily contact with patient to assess and evaluate symptoms and progress in treatment and Medication management   Ms. Wardle is a 41 year old female with a history of depression admitted after overdose on in the context of relapse on alcohol and medication noncompliance.  1. Suicidal ideation. The patient is able to contract for safety in the hospital.  2. Mood. We just restarted Effexor and Remeron.   3. PTSD.  She is on Minipress at night for nightmares and Neurontin and Vistaril for anxiety. I will give low dose Seroquel as needed for anxiety.  4. COPD. Albuterol is available.   5. Constipation. She is on Colace.   6. Metabolic syndrome monitoring. Lipid panel shows elevated triglycerides, TSH is high at 9.1, hemoglobin A1c pending. We ordered T4.  7. EKG. Normal sinus rhythm, QTc  472.  8. Pregnancy test not done. She has IUD.  9. Migraine headaches. Imitrex is available.   10. Disposition. She will be discharged to her apartment. She will follow up with Dr. Randel Books at [facility]  Orson Slick, MD 04/12/2017, 9:41 AM

## 2017-04-12 NOTE — Tx Team (Signed)
Interdisciplinary Treatment and Diagnostic Plan Update  04/12/2017 Time of Session: 1045 Sandy Mason MRN: [id number]  Principal Diagnosis: Major depressive disorder, recurrent severe without psychotic features (HCC)  Secondary Diagnoses: Principal Problem:   Major depressive disorder, recurrent severe without psychotic features (HCC) Active Problems:   Borderline personality disorder   Tobacco use disorder   PTSD (post-traumatic stress disorder)   Alcohol intoxication (HCC)   Salicylate overdose   Current Medications:  Current Facility-Administered Medications  Medication Dose Route Frequency Provider Last Rate Last Dose  . acetaminophen (TYLENOL) tablet 650 mg  650 mg Oral Q6H PRN Clapacs, John T, MD   650 mg at 04/12/17 1412  . albuterol (PROVENTIL HFA;VENTOLIN HFA) 108 (90 Base) MCG/ACT inhaler 2 puff  2 puff Inhalation Q6H PRN Pucilowska, Jolanta B, MD   2 puff at 04/12/17 1247  . alum & mag hydroxide-simeth (MAALOX/MYLANTA) 200-200-20 MG/5ML suspension 30 mL  30 mL Oral Q4H PRN Clapacs, John T, MD      . docusate sodium (COLACE) capsule 100 mg  100 mg Oral BID Clapacs, John T, MD   100 mg at 04/12/17 0840  . gabapentin (NEURONTIN) capsule 900 mg  900 mg Oral TID Clapacs, John T, MD   900 mg at 04/12/17 1226  . hydrOXYzine (ATARAX/VISTARIL) tablet 50 mg  50 mg Oral TID PRN Clapacs, John T, MD   50 mg at 04/12/17 0331  . magnesium hydroxide (MILK OF MAGNESIA) suspension 30 mL  30 mL Oral Daily PRN Clapacs, John T, MD      . mirtazapine (REMERON) tablet 15 mg  15 mg Oral QHS Clapacs, John T, MD   15 mg at 04/11/17 2113  . nicotine (NICODERM CQ - dosed in mg/24 hours) patch 21 mg  21 mg Transdermal Daily Pucilowska, Jolanta B, MD   21 mg at 04/12/17 1230  . prazosin (MINIPRESS) capsule 2 mg  2 mg Oral QHS Clapacs, John T, MD   2 mg at 04/11/17 2113  . QUEtiapine (SEROQUEL) tablet 25 mg  25 mg Oral TID Pucilowska, Jolanta B, MD   25 mg at 04/12/17 1246  . SUMAtriptan (IMITREX)  tablet 50 mg  50 mg Oral Q2H PRN Pucilowska, Jolanta B, MD   50 mg at 04/12/17 1113  . thiamine (VITAMIN B-1) tablet 100 mg  100 mg Oral Daily Clapacs, John T, MD   100 mg at 04/12/17 0841  . venlafaxine XR (EFFEXOR-XR) 24 hr capsule 300 mg  300 mg Oral Q breakfast Clapacs, John T, MD   300 mg at 04/12/17 0840   PTA Medications: Prescriptions Prior to Admission  Medication Sig Dispense Refill Last Dose  . albuterol (PROVENTIL HFA;VENTOLIN HFA) 108 (90 Base) MCG/ACT inhaler Inhale 2 puffs into the lungs every 6 (six) hours as needed for wheezing or shortness of breath. 1 Inhaler 2   . dicyclomine (BENTYL) 20 MG tablet Take 1 tablet (20 mg total) by mouth 3 (three) times daily as needed (abdominal pain). (Patient not taking: Reported on 04/09/2017) 30 tablet 0 Not Taking at Unknown time  . famotidine (PEPCID) 20 MG tablet Take 20 mg by mouth 2 (two) times daily.     . gabapentin (NEURONTIN) 300 MG capsule Take 3 capsules (900 mg total) by mouth 3 (three) times daily. (Patient taking differently: Take 300 mg by mouth 3 (three) times daily. ) 270 capsule 1 04/15/2016 at Unknown time  . lidocaine (LIDODERM) 5 % Place 2 patches onto the skin daily. Remove & Discard patch within   12 hours or as directed by MD (Patient not taking: Reported on 04/09/2017) 60 patch 0 Not Taking at Unknown time  . metoprolol tartrate (LOPRESSOR) 25 MG tablet Take 0.5 tablets (12.5 mg total) by mouth 2 (two) times daily. (Patient not taking: Reported on 04/09/2017) 60 tablet 0 Not Taking at Unknown time  . mirtazapine (REMERON) 15 MG tablet Take 1 tablet (15 mg total) by mouth at bedtime. 30 tablet 1 Past Month at Unknown time  . Multiple Vitamin (MULTIVITAMIN WITH MINERALS) TABS tablet Take 1 tablet by mouth daily. (Patient not taking: Reported on 04/16/2016) 30 tablet 0 Not Taking at Unknown time  . ondansetron (ZOFRAN ODT) 4 MG disintegrating tablet Take 1 tablet (4 mg total) by mouth every 8 (eight) hours as needed for nausea or  vomiting. (Patient not taking: Reported on 04/09/2017) 20 tablet 0 Not Taking at Unknown time  . ondansetron (ZOFRAN) 4 MG tablet Take 1 tablet (4 mg total) by mouth every 8 (eight) hours as needed for nausea or vomiting. (Patient not taking: Reported on 04/09/2017) 20 tablet 0 Not Taking at Unknown time  . prazosin (MINIPRESS) 2 MG capsule Take 1 capsule (2 mg total) by mouth at bedtime. (Patient not taking: Reported on 04/09/2017) 30 capsule 1 Not Taking at Unknown time  . promethazine (PHENERGAN) 12.5 MG tablet Take 1 tablet (12.5 mg total) by mouth every 6 (six) hours as needed for nausea or vomiting. (Patient not taking: Reported on 04/16/2016) 30 tablet 0 Not Taking at Unknown time  . venlafaxine XR (EFFEXOR-XR) 150 MG 24 hr capsule Take 2 capsules (300 mg total) by mouth daily with breakfast. 60 capsule 1 Past Month at Unknown time    Patient Stressors:    Patient Strengths:    Treatment Modalities: Medication Management, Group therapy, Case management,  1 to 1 session with clinician, Psychoeducation, Recreational therapy.   Physician Treatment Plan for Primary Diagnosis: Major depressive disorder, recurrent severe without psychotic features (HCC) Long Term Goal(s): Improvement in symptoms so as ready for discharge Improvement in symptoms so as ready for discharge   Short Term Goals: Ability to identify changes in lifestyle to reduce recurrence of condition will improve Ability to verbalize feelings will improve Ability to disclose and discuss suicidal ideas Ability to demonstrate self-control will improve Ability to identify and develop effective coping behaviors will improve Ability to maintain clinical measurements within normal limits will improve Compliance with prescribed medications will improve Ability to identify triggers associated with substance abuse/mental health issues will improve Ability to identify changes in lifestyle to reduce recurrence of condition will  improve Ability to demonstrate self-control will improve Ability to identify triggers associated with substance abuse/mental health issues will improve  Medication Management: Evaluate patient's response, side effects, and tolerance of medication regimen.  Therapeutic Interventions: 1 to 1 sessions, Unit Group sessions and Medication administration.  Evaluation of Outcomes: Not Met  Physician Treatment Plan for Secondary Diagnosis: Principal Problem:   Major depressive disorder, recurrent severe without psychotic features (HCC) Active Problems:   Borderline personality disorder   Tobacco use disorder   PTSD (post-traumatic stress disorder)   Alcohol intoxication (HCC)   Salicylate overdose  Long Term Goal(s): Improvement in symptoms so as ready for discharge Improvement in symptoms so as ready for discharge   Short Term Goals: Ability to identify changes in lifestyle to reduce recurrence of condition will improve Ability to verbalize feelings will improve Ability to disclose and discuss suicidal ideas Ability to demonstrate self-control will improve Ability   to identify and develop effective coping behaviors will improve Ability to maintain clinical measurements within normal limits will improve Compliance with prescribed medications will improve Ability to identify triggers associated with substance abuse/mental health issues will improve Ability to identify changes in lifestyle to reduce recurrence of condition will improve Ability to demonstrate self-control will improve Ability to identify triggers associated with substance abuse/mental health issues will improve     Medication Management: Evaluate patient's response, side effects, and tolerance of medication regimen.  Therapeutic Interventions: 1 to 1 sessions, Unit Group sessions and Medication administration.  Evaluation of Outcomes: Not Met   RN Treatment Plan for Primary Diagnosis: Major depressive disorder, recurrent  severe without psychotic features ([place]) Long Term Goal(s): Knowledge of disease and therapeutic regimen to maintain health will improve  Short Term Goals: Ability to verbalize feelings will improve, Ability to disclose and discuss suicidal ideas, Ability to identify and develop effective coping behaviors will improve and Compliance with prescribed medications will improve  Medication Management: RN will administer medications as ordered by provider, will assess and evaluate patient's response and provide education to patient for prescribed medication. RN will report any adverse and/or side effects to prescribing provider.  Therapeutic Interventions: 1 on 1 counseling sessions, Psychoeducation, Medication administration, Evaluate responses to treatment, Monitor vital signs and CBGs as ordered, Perform/monitor CIWA, COWS, AIMS and Fall Risk screenings as ordered, Perform wound care treatments as ordered.  Evaluation of Outcomes: Not Met   LCSW Treatment Plan for Primary Diagnosis: Major depressive disorder, recurrent severe without psychotic features ([place]) Long Term Goal(s): Safe transition to appropriate next level of care at discharge, Engage patient in therapeutic group addressing interpersonal concerns.  Short Term Goals: Engage patient in aftercare planning with referrals and resources, Increase social support and Increase skills for wellness and recovery  Therapeutic Interventions: Assess for all discharge needs, 1 to 1 time with Social worker, Explore available resources and support systems, Assess for adequacy in community support network, Educate family and significant other(s) on suicide prevention, Complete Psychosocial Assessment, Interpersonal group therapy.  Evaluation of Outcomes: Not Met   Progress in Treatment: Attending groups: Yes. Participating in groups: Yes. Taking medication as prescribed: Yes. Toleration medication: Yes. Family/Significant other contact made: No, will  contact:  friend Patient understands diagnosis: Yes. Discussing patient identified problems/goals with staff: Yes. Medical problems stabilized or resolved: Yes. Denies suicidal/homicidal ideation: Yes. Issues/concerns per patient self-inventory: No. Other: none  New problem(s) identified: No, Describe:  none  New Short Term/Long Term Goal(s): Pt goal" "To get level headed and rational."  Discharge Plan or Barriers: CSW assessing for appropriate plan.  Reason for Continuation of Hospitalization: Depression Medication stabilization  Estimated Length of Stay:3-5 days.  Attendees: [name] 04/12/2017   Physician: Dr. Bary Leriche, MD 04/12/2017   Nursing: Elige Radon, RN 04/12/2017   RN Care Manager: 04/12/2017   Social Worker: Lurline Idol, LCSW 04/12/2017   Recreational Therapist:  04/12/2017   Other:  04/12/2017   Other:  04/12/2017   Other: 04/12/2017        Scribe for Treatment Team: Joanne Chars, [place] 04/12/2017 2:33 PM

## 2017-04-12 NOTE — Progress Notes (Signed)
Rates depression and hopelessness as 8/10 and anxiety as 10 /10.  Affect appropriate to circumstance.  Maintains good eye contact.  Thought processes are logical  Visible in the milieu, interacting with peers and staff appropriately.  Agreeable to treatment regiment although exhibits med seeking tendencies at times.  Scheduled medications given.  Support and encouragement offered.  Safety rounds maintained.

## 2017-04-12 NOTE — Progress Notes (Signed)
Patient complained of anxiety during the shift.  She does not feel the Vistaril is effective.  She reports having more success with Ativan on previous occasions.

## 2017-04-13 MED ORDER — PRAZOSIN HCL 2 MG PO CAPS
3.0000 mg | ORAL_CAPSULE | Freq: Every day | ORAL | Status: DC
  Administered 2017-04-13 – 2017-04-14 (×2): 3 mg via ORAL
  Filled 2017-04-13 (×2): qty 1

## 2017-04-13 NOTE — [facility] Group Notes (Signed)
[facility] Group Notes:  (Nursing/MHT/Case Management/Adjunct)  Date:  04/13/2017  Time:  5:07 AM  Type of Therapy:  Psychoeducational Skills  Participation Level:  Active  Participation Quality:  Appropriate and Sharing  Affect:  Appropriate  Cognitive:  Appropriate  Insight:  Appropriate and Good  Engagement in Group:  Engaged  Modes of Intervention:  Discussion, Socialization and Support  Summary of Progress/Problems:  Sandy Mason 04/13/2017, 5:07 AM

## 2017-04-13 NOTE — [facility] Group Notes (Signed)
LCSW Group Therapy Note  04/13/2017 1:15pm  Type of Therapy and Topic: Group Therapy: Holding on to Grudges   Participation Level: Active   Description of Group:  In this group patients will be asked to explore and define a grudge. Patients will be guided to discuss their thoughts, feelings, and reasons as to why people have grudges. Patients will process the impact grudges have on daily life and identify thoughts and feelings related to holding grudges. Facilitator will challenge patients to identify ways to let go of grudges and the benefits this provides. Patients will be confronted to address why one struggles letting go of grudges. Lastly, patients will identify feelings and thoughts related to what life would look like without grudges. This group will be process-oriented, with patients participating in exploration of their own experiences, giving and receiving support, and processing challenge from other group members.  Therapeutic Goals:  1. Patient will identify specific grudges related to their personal life.  2. Patient will identify feelings, thoughts, and beliefs around grudges.  3. Patient will identify how one releases grudges appropriately.  4. Patient will identify situations where they could have let go of the grudge, but instead chose to hold on.   Summary of Patient Progress: Pt was active in group today and shared personal situation as well as thoughts and feelings around this topic.     Therapeutic Modalities:  Cognitive Behavioral Therapy  Solution Focused Therapy  Motivational Interviewing  Brief Therapy   Lorri Frederick, LCSW 04/13/2017 2:20 PM

## 2017-04-13 NOTE — Progress Notes (Signed)
[facility] MD Progress Note  04/13/2017 2:36 PM CLARY BOULAIS  MRN:  [id number]  Subjective:  Ms. Goers is a 41 year old female with history of depression, mood instability, and borderline personality disorder admitted after overdose while drunk. We continue her medications as prescribed by Dr.Moffit. No benzos.   04/12/2017. Ms. Droessler met with treatment team today. She complains of severe anxiety. She did not associate heightening of anxiety with medication noncompliance. She seems to understand it now. She still tries to extra benzodiazepines from m there is a history of benzodiazepine abuse. She complains of migraine headache. She did not sleep well last night experiencing dreams but not PTSD related nightmares. She is very eager to participate in programming but states that she has not been able to think about discharge planning. She did call her job. She still feels very depressed. she told us in treatment team about beams in her walking closet that she would use for hanging. She does have the rope.  9/15-  Chart reviewed, discussed with nursing staff, pt seen.  Pt somatic, anxious, reports depression is better, denies SI.  Reports poor sleep, frequent awakening and vivid dreams.    Per nursing: Patient remains anxious, writer talked to patient about using various coping skills.  Visible in the milieu, interacting with peers and staff appropriately. Cooperative with treatment regiment still exhibits med seeking tendencies at times. Cooperative with medication regime. She is currently in dayroom watching t.v.  Rates depression and hopelessness as 8/10 and anxiety as 10 /10.  Affect appropriate to circumstance.  Maintains good eye contact.  Thought processes are logical  Visible in the milieu, interacting with peers and staff appropriately.  Agreeable to treatment regiment although exhibits med seeking tendencies at times.  Scheduled medications given.   Principal Problem: Major depressive  disorder, recurrent severe without psychotic features ([place]) Diagnosis:   Patient Active Problem List   Diagnosis Date Noted  . Major depressive disorder, recurrent severe without psychotic features ([place]) [F33.2] 04/11/2017  . Salicylate overdose [O84.166A] 04/09/2017  . AKI (acute kidney injury) ([place]) [N17.9] 04/03/2016  . Suicidal behavior [R46.89] 01/16/2016  . Alcohol intoxication ([place]) [F10.929]   . Severe episode of recurrent major depressive disorder, without psychotic features ([place]) [F33.2]   . Malingering [Z76.5] 12/16/2015  . Major depressive disorder, recurrent episode, moderate ([place]) [F33.1] 12/12/2015  . Closed pelvic fracture ([place]) [S32.9XXA] 10/31/2015  . Tobacco abuse [Z72.0] 10/31/2015  . Tobacco use disorder [F17.200] 03/28/2015  . PTSD (post-traumatic stress disorder) [F43.10] 03/28/2015  . Stimulant use disorder (cocaine) [F15.90] 03/28/2015  . Sedative, hypnotic or anxiolytic use disorder, severe, dependence ([place]) [F13.20] 03/27/2015  . Opioid use disorder, severe, dependence ([place]) [F11.20] 12/20/2014  . Alcohol use disorder severe. [F10.20] 12/18/2014  . Borderline personality disorder [F60.3] 12/18/2014   Total Time spent with patient: 30 minutes  Past Psychiatric History: Depression, mood instability, and substance abuse.  Past Medical History:  Past Medical History:  Diagnosis Date  . Alcohol use disorder severe. 12/18/2014  . Anxiety   . Borderline personality disorder 12/18/2014  . CVA (cerebral infarction)   . Depression   . 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 Opioid use disorder, severe, dependence ([place]) 12/20/2014  . PTSD (post-traumatic stress disorder) 03/28/2015  . Sedative, hypnotic or anxiolytic use disorder, severe, dependence ([place]) 03/27/2015  . Seizures ([place])   . Suicide attempt by hanging [facility])    History reviewed. No pertinent surgical history. Family History:  Family History  Problem Relation  Age of Onset  . Diabetes Father    Family Psychiatric  History: See H&P.  Social History:  History  Alcohol Use  . Yes     History  Drug Use No    Social History   Social History  . Marital status: Single    Spouse name: N/A  . Number of children: N/A  . Years of education: N/A   Social History Main Topics  . Smoking status: Current Every Day Smoker    Packs/day: 1.00    Types: Cigarettes  . Smokeless tobacco: Never Used  . Alcohol use Yes  . Drug use: No  . Sexual activity: Yes    Birth control/ protection: IUD   Other Topics Concern  . None   Social History Narrative  . None   Additional Social History:                         Sleep: Fair  Appetite:  Fair  Current Medications: Current Facility-Administered Medications  Medication Dose Route Frequency Provider Last Rate Last Dose  . acetaminophen (TYLENOL) tablet 650 mg  650 mg Oral Q6H PRN Clapacs, Madie Reno, MD   650 mg at 04/13/17 0848  . albuterol (PROVENTIL HFA;VENTOLIN HFA) 108 (90 Base) MCG/ACT inhaler 2 puff  2 puff Inhalation Q6H PRN Pucilowska, Jolanta B, MD   2 puff at 04/12/17 1658  . alum & mag hydroxide-simeth (MAALOX/MYLANTA) 200-200-20 MG/5ML suspension 30 mL  30 mL Oral Q4H PRN Clapacs, John T, MD      . docusate sodium (COLACE) capsule 100 mg  100 mg Oral BID Clapacs, Madie Reno, MD   100 mg at 04/13/17 0747  . gabapentin (NEURONTIN) capsule 900 mg  900 mg Oral TID Clapacs, John T, MD   900 mg at 04/13/17 1141  . hydrOXYzine (ATARAX/VISTARIL) tablet 50 mg  50 mg Oral TID PRN Clapacs, Madie Reno, MD   50 mg at 04/12/17 2143  . magnesium hydroxide (MILK OF MAGNESIA) suspension 30 mL  30 mL Oral Daily PRN Clapacs, John T, MD      . mirtazapine (REMERON) tablet 15 mg  15 mg Oral QHS Clapacs, Madie Reno, MD   15 mg at 04/12/17 2143  . nicotine (NICODERM CQ - dosed in mg/24 hours) patch 21 mg  21 mg Transdermal Daily Pucilowska, Jolanta B, MD   21 mg at 04/13/17 0747  . prazosin (MINIPRESS) capsule 2 mg   2 mg Oral QHS Clapacs, Madie Reno, MD   2 mg at 04/12/17 2136  . QUEtiapine (SEROQUEL) tablet 25 mg  25 mg Oral TID Pucilowska, Jolanta B, MD   25 mg at 04/13/17 1141  . SUMAtriptan (IMITREX) tablet 50 mg  50 mg Oral Q2H PRN Pucilowska, Jolanta B, MD   50 mg at 04/13/17 0747  . thiamine (VITAMIN B-1) tablet 100 mg  100 mg Oral Daily Clapacs, Madie Reno, MD   100 mg at 04/13/17 0747  . venlafaxine XR (EFFEXOR-XR) 24 hr capsule 300 mg  300 mg Oral Q breakfast Clapacs, Madie Reno, MD   300 mg at 04/13/17 0747    Lab Results:  Results for orders placed or performed during the hospital encounter of 04/11/17 (from the past 48 hour(s))  Hemoglobin A1c     Status: Abnormal   Collection Time: 04/12/17  7:04 AM  Result Value Ref Range   Hgb A1c MFr Bld 5.8 (H) 4.8 - 5.6 %    Comment: (NOTE) Pre diabetes:  5.7%-6.4% Diabetes:              >6.4% Glycemic control for   <7.0% adults with diabetes    Mean Plasma Glucose 119.76 mg/dL    Comment: Performed at [facility] [address] [place]  [address]  Lipid panel     Status: Abnormal   Collection Time: 04/12/17  7:04 AM  Result Value Ref Range   Cholesterol 178 0 - 200 mg/dL   Triglycerides 297 (H) <150 mg/dL   HDL 39 (L) >40 mg/dL   Total CHOL/HDL Ratio 4.6 RATIO   VLDL 59 (H) 0 - 40 mg/dL   LDL Cholesterol 80 0 - 99 mg/dL    Comment:        Total Cholesterol/HDL:CHD Risk Coronary Heart Disease Risk Table                     Men   Women  1/2 Average Risk   3.4   3.3  Average Risk       5.0   4.4  2 X Average Risk   9.6   7.1  3 X Average Risk  23.4   11.0        Use the calculated Patient Ratio above and the CHD Risk Table to determine the patient's CHD Risk.        ATP III CLASSIFICATION (LDL):  <100     mg/dL   Optimal  100-129  mg/dL   Near or Above                    Optimal  130-159  mg/dL   Borderline  160-189  mg/dL   High  >190     mg/dL   Very High   TSH     Status: Abnormal   Collection Time: 04/12/17   7:04 AM  Result Value Ref Range   TSH 9.173 (H) 0.350 - 4.500 uIU/mL    Comment: Performed by a 3rd Generation assay with a functional sensitivity of <=0.01 uIU/mL.  T4, free     Status: None   Collection Time: 04/12/17  7:04 AM  Result Value Ref Range   Free T4 0.67 0.61 - 1.12 ng/dL    Comment: (NOTE) Biotin ingestion may interfere with free T4 tests. If the results are inconsistent with the TSH level, previous test results, or the clinical presentation, then consider biotin interference. If needed, order repeat testing after stopping biotin.     Blood Alcohol level:  Lab Results  Component Value Date   ETH 244 (H) 04/08/2017   ETH 181 (H) 95/03/3266    Metabolic Disorder Labs: Lab Results  Component Value Date   HGBA1C 5.8 (H) 04/12/2017   MPG 119.76 04/12/2017   MPG 116.89 04/09/2017   Lab Results  Component Value Date   PROLACTIN 98.1 (H) 12/12/2015   Lab Results  Component Value Date   CHOL 178 04/12/2017   TRIG 297 (H) 04/12/2017   HDL 39 (L) 04/12/2017   CHOLHDL 4.6 04/12/2017   VLDL 59 (H) 04/12/2017   LDLCALC 80 04/12/2017   LDLCALC 89 12/12/2015    Physical Findings: AIMS:  , ,  ,  ,    CIWA:  CIWA-Ar Total: 1 COWS:     Musculoskeletal: Strength & Muscle Tone: within normal limits Gait & Station: normal Patient leans: N/A  Psychiatric Specialty Exam: Physical Exam  Nursing note and vitals reviewed. Psychiatric: Her speech is normal and behavior  is normal. Her mood appears anxious. Cognition and memory are normal. She expresses impulsivity. She expresses suicidal ideation. She expresses suicidal plans.    Review of Systems  Psychiatric/Behavioral: Positive for depression, substance abuse and suicidal ideas. The patient is nervous/anxious and has insomnia.   All other systems reviewed and are negative.   Blood pressure 116/67, pulse 64, temperature 98.5 F (36.9 C), temperature source Oral, resp. rate (!) 8, height 5' 8" (1.727 m), weight 96.6  kg (213 lb), SpO2 99 %.Body mass index is 32.39 kg/m.  General Appearance: Casual  Eye Contact:  Good  Speech:  Clear and Coherent  Volume:  Normal  Mood:  Anxious  Affect:  Blunt  Thought Process:  Goal Directed and Descriptions of Associations: Intact  Orientation:  Full (Time, Place, and Person)  Thought Content:  somatic  Suicidal Thoughts:  denies  Homicidal Thoughts:  No  Memory:  Immediate;   Fair Recent;   Fair Remote;   Fair  Judgement:  Poor  Insight:  Shallow  Psychomotor Activity:  Normal  Concentration:  Concentration: Fair and Attention Span: Fair  Recall:  [employer] of Knowledge:  Fair  Language:  Fair  Akathisia:  No  Handed:  Right  AIMS (if indicated):     Assets:  Communication Skills Desire for Improvement Housing Physical Health Resilience Social Support Vocational/Educational  ADL's:  Intact  Cognition:  WNL  Sleep:  Number of Hours: 5.5     Treatment Plan Summary: Daily contact with patient to assess and evaluate symptoms and progress in treatment and Medication management   Ms. Zee is a 41 year old female with a history of depression admitted after overdose on in the context of relapse on alcohol and medication noncompliance.  1. Suicidal ideation. The patient is able to contract for safety in the hospital.  2. Mood. We just restarted Effexor and Remeron.   3. PTSD. Increase  Minipress at night for nightmares. Cont  Neurontin and Vistaril for anxiety. I will give low dose Seroquel as needed for anxiety.  4. COPD. Albuterol is available.   5. Constipation. She is on Colace.   6. Metabolic syndrome monitoring. Lipid panel shows elevated triglycerides, TSH is high at 9.1, hemoglobin A1c pending. We ordered T4.  7. EKG. Normal sinus rhythm, QTc 472.  8. Pregnancy test not done. She has IUD.  9. Migraine headaches. Imitrex is available.   10. Disposition. She will be discharged to her apartment. She will follow up with Dr.  Randel Books at [facility].  Lenward Chancellor, MD 04/13/2017, 2:36 PMPatient ID: Florentina Jenny, female   DOB: Sep 27, 1975, 41 y.o.   MRN: [id number]

## 2017-04-13 NOTE — Progress Notes (Signed)
Patient is up on the milieu she is pleasant and cooperative. She does med seek at times stating that she is anxious. She does take all meds as prescribed. Prn medications given see mar. She denies si, hi, avh. But does complain of vivid dreams. md increased prazosin. Will continue to monitor.

## 2017-04-13 NOTE — [facility] Suicide Risk Assessment (Signed)
[facility] INPATIENT:  Family/Significant Other Suicide Prevention Education  Suicide Prevention Education:  Contact Attempts: Lambert Keto, friend, [phone], has been identified by the patient as the family member/significant other with whom the patient will be residing, and identified as the person(s) who will aid the patient in the event of a mental health crisis.  With written consent from the patient, two attempts were made to provide suicide prevention education, prior to and/or following the patient's discharge.  We were unsuccessful in providing suicide prevention education.  A suicide education pamphlet was given to the patient to share with family/significant other.  Date and time of first attempt:04/13/17, 1249 Date and time of second attempt:  Lorri Frederick, LCSW 04/13/2017, 12:49 PM

## 2017-04-14 MED ORDER — IBUPROFEN 600 MG PO TABS
600.0000 mg | ORAL_TABLET | Freq: Four times a day (QID) | ORAL | Status: DC | PRN
  Administered 2017-04-14 – 2017-04-16 (×5): 600 mg via ORAL
  Filled 2017-04-14 (×5): qty 1

## 2017-04-14 NOTE — Progress Notes (Signed)
Patient up on the unit social with peers. She is positive and states she feels great. New order for motrin given and she states that is effective. She denies si, hi, avh. She is appropriate on the unit. Will continue to monitor.

## 2017-04-14 NOTE — Progress Notes (Signed)
Sandy Mason cooperative with treatment, she was visible in the milieu interacting with peers, she spent most of the evening in the dayroom with peers watching t.v.  She was medication complaint on shift.  No behavioral issues to report on shift at this time.

## 2017-04-14 NOTE — [facility] Group Notes (Signed)
[facility] Group Notes:  (Nursing/MHT/Case Management/Adjunct)  Date:  04/14/2017  Time:  5:11 AM  Type of Therapy:  Psychoeducational Skills  Participation Level:  Active  Participation Quality:  Appropriate, Attentive and Sharing  Affect:  Appropriate  Cognitive:  Appropriate  Insight:  Appropriate and Good  Engagement in Group:  Engaged  Modes of Intervention:  Discussion, Socialization and Support  Summary of Progress/Problems:  Chancy Milroy 04/14/2017, 5:11 AM

## 2017-04-14 NOTE — Progress Notes (Signed)
Patient ID: Sandy Mason, female   DOB: 1975/11/28, 41 y.o.   MRN: [id number] LCSW Group Therapy Note  04/14/2017 1:00pm  Type of Therapy and Topic:  Group Therapy:  Feelings around Relapse and Recovery  Participation Level:  Active   Description of Group:    Patients in this group will discuss emotions they experience before and after a relapse. They will process how experiencing these feelings, or avoidance of experiencing them, relates to having a relapse. Facilitator will guide patients to explore emotions they have related to recovery. Patients will be encouraged to process which emotions are more powerful. They will be guided to discuss the emotional reaction significant others in their lives may have to their relapse or recovery. Patients will be assisted in exploring ways to respond to the emotions of others without this contributing to a relapse.  Therapeutic Goals: 1. Patient will identify two or more emotions that lead to a relapse for them 2. Patient will identify two emotions that result when they relapse 3. Patient will identify two emotions related to recovery 4. Patient will demonstrate ability to communicate their needs through discussion and/or role plays   Summary of Patient Progress:  Pt able to meet therapeutic goals listed above. She has some insight into her pattern of addictive behavior.  She verbalizes that she had not been doing what she needed to to maintain her recovery. She says she has been clean for what would've been a year in October.   Therapeutic Modalities:   Cognitive Behavioral Therapy Solution-Focused Therapy Assertiveness Training Relapse Prevention Therapy   Glennon Mac, LCSW 04/14/2017 3:06 PM

## 2017-04-14 NOTE — Progress Notes (Signed)
[facility] MD Progress Note  04/14/2017 11:51 AM Sandy Mason  MRN:  [id number]  Subjective:  Sandy Mason is a 40 year old female with history of depression, mood instability, and borderline personality disorder admitted after overdose while drunk. We continue her medications as prescribed by Dr.Moffit. No benzos.    9/16-  Chart reviewed, discussed with nursing staff, pt seen.  Pt somatic at times, anxious, mood improving, denies SI. Slept better.      Per nursing: Patient is up on the milieu she is pleasant and cooperative. She does med seek at times stating that she is anxious. She does take all meds as prescribed. Prn medications given see mar. She denies si, hi, avh.  Principal Problem: Major depressive disorder, recurrent severe without psychotic features (HCC) Diagnosis:   Patient Active Problem List   Diagnosis Date Noted  . Major depressive disorder, recurrent severe without psychotic features (HCC) [F33.2] 04/11/2017  . Salicylate overdose [T39.091A] 04/09/2017  . AKI (acute kidney injury) (HCC) [N17.9] 04/03/2016  . Suicidal behavior [R46.89] 01/16/2016  . Alcohol intoxication (HCC) [F10.929]   . Severe episode of recurrent major depressive disorder, without psychotic features (HCC) [F33.2]   . Malingering [Z76.5] 12/16/2015  . Major depressive disorder, recurrent episode, moderate (HCC) [F33.1] 12/12/2015  . Closed pelvic fracture (HCC) [S32.9XXA] 10/31/2015  . Tobacco abuse [Z72.0] 10/31/2015  . Tobacco use disorder [F17.200] 03/28/2015  . PTSD (post-traumatic stress disorder) [F43.10] 03/28/2015  . Stimulant use disorder (cocaine) [F15.90] 03/28/2015  . Sedative, hypnotic or anxiolytic use disorder, severe, dependence (HCC) [F13.20] 03/27/2015  . Opioid use disorder, severe, dependence (HCC) [F11.20] 12/20/2014  . Alcohol use disorder severe. [F10.20] 12/18/2014  . Borderline personality disorder [F60.3] 12/18/2014   Total Time spent with patient: 30 minutes  Past  Psychiatric History: Depression, mood instability, and substance abuse.  Past Medical History:  Past Medical History:  Diagnosis Date  . Alcohol use disorder severe. 12/18/2014  . Anxiety   . Borderline personality disorder 12/18/2014  . CVA (cerebral infarction)   . Depression   . 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 Opioid use disorder, severe, dependence (HCC) 12/20/2014  . PTSD (post-traumatic stress disorder) 03/28/2015  . 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 Seizures (HCC)   . Suicide attempt by hanging [facility])    History reviewed. No pertinent surgical history. Family History:  Family History  Problem Relation Age of Onset  . Diabetes Father    Family Psychiatric  History: See H&P.  Social History:  History  Alcohol Use  . Yes     History  Drug Use No    Social History   Social History  . Marital status: Single    Spouse name: N/A  . Number of children: N/A  . Years of education: N/A   Social History Main Topics  . Smoking status: Current Every Day Smoker    Packs/day: 1.00    Types: Cigarettes  . Smokeless tobacco: Never Used  . Alcohol use Yes  . Drug use: No  . Sexual activity: Yes    Birth control/ protection: IUD   Other Topics Concern  . None   Social History Narrative  . None   Additional Social History:                         Sleep: Fair  Appetite:  Fair  Current Medications: Current Facility-Administered Medications  Medication Dose Route Frequency Provider  Last Rate Last Dose  . acetaminophen (TYLENOL) tablet 650 mg  650 mg Oral Q6H PRN Clapacs, Jackquline Denmark, MD   650 mg at 04/14/17 0650  . albuterol (PROVENTIL HFA;VENTOLIN HFA) 108 (90 Base) MCG/ACT inhaler 2 puff  2 puff Inhalation Q6H PRN Pucilowska, Jolanta B, MD   2 puff at 04/12/17 1658  . alum & mag hydroxide-simeth (MAALOX/MYLANTA) 200-200-20 MG/5ML suspension 30 mL  30 mL Oral Q4H  PRN Clapacs, John T, MD      . docusate sodium (COLACE) capsule 100 mg  100 mg Oral BID Clapacs, Jackquline Denmark, MD   100 mg at 04/14/17 0823  . gabapentin (NEURONTIN) capsule 900 mg  900 mg Oral TID Clapacs, Jackquline Denmark, MD   900 mg at 04/14/17 1148  . hydrOXYzine (ATARAX/VISTARIL) tablet 50 mg  50 mg Oral TID PRN Clapacs, Jackquline Denmark, MD   50 mg at 04/13/17 1456  . magnesium hydroxide (MILK OF MAGNESIA) suspension 30 mL  30 mL Oral Daily PRN Clapacs, John T, MD      . mirtazapine (REMERON) tablet 15 mg  15 mg Oral QHS Clapacs, Jackquline Denmark, MD   15 mg at 04/13/17 2206  . nicotine (NICODERM CQ - dosed in mg/24 hours) patch 21 mg  21 mg Transdermal Daily Pucilowska, Jolanta B, MD   21 mg at 04/14/17 0823  . prazosin (MINIPRESS) capsule 3 mg  3 mg Oral QHS Beverly Sessions, MD   3 mg at 04/13/17 2159  . QUEtiapine (SEROQUEL) tablet 25 mg  25 mg Oral TID Pucilowska, Jolanta B, MD   25 mg at 04/14/17 1148  . SUMAtriptan (IMITREX) tablet 50 mg  50 mg Oral Q2H PRN Pucilowska, Jolanta B, MD   50 mg at 04/14/17 0650  . thiamine (VITAMIN B-1) tablet 100 mg  100 mg Oral Daily Clapacs, Jackquline Denmark, MD   100 mg at 04/14/17 0823  . venlafaxine XR (EFFEXOR-XR) 24 hr capsule 300 mg  300 mg Oral Q breakfast Clapacs, Jackquline Denmark, MD   300 mg at 04/14/17 1610    Lab Results:  No results found for this or any previous visit (from the past 48 hour(s)).  Blood Alcohol level:  Lab Results  Component Value Date   ETH 244 (H) 04/08/2017   ETH 181 (H) 04/16/2016    Metabolic Disorder Labs: Lab Results  Component Value Date   HGBA1C 5.8 (H) 04/12/2017   MPG 119.76 04/12/2017   MPG 116.89 04/09/2017   Lab Results  Component Value Date   PROLACTIN 98.1 (H) 12/12/2015   Lab Results  Component Value Date   CHOL 178 04/12/2017   TRIG 297 (H) 04/12/2017   HDL 39 (L) 04/12/2017   CHOLHDL 4.6 04/12/2017   VLDL 59 (H) 04/12/2017   LDLCALC 80 04/12/2017   LDLCALC 89 12/12/2015    Physical Findings: AIMS:  , ,  ,  ,    CIWA:  CIWA-Ar  Total: 1 COWS:     Musculoskeletal: Strength & Muscle Tone: within normal limits Gait & Station: normal Patient leans: N/A  Psychiatric Specialty Exam: Physical Exam  Nursing note and vitals reviewed. Psychiatric: Her speech is normal and behavior is normal. Her mood appears anxious. Cognition and memory are normal. She expresses impulsivity. She expresses suicidal ideation. She expresses suicidal plans.    Review of Systems  Psychiatric/Behavioral: Positive for depression, substance abuse and suicidal ideas. The patient is nervous/anxious and has insomnia.   All other systems reviewed and are negative.  Blood pressure 122/74, pulse 68, temperature 98.5 F (36.9 C), temperature source Oral, resp. rate 18, height  (1.727 m), weight 96.6 kg (213 lb), SpO2 99 %.Body mass index is 32.39 kg/m.  General Appearance: Casual  Eye Contact:  Good  Speech:  Clear and Coherent  Volume:  Normal  Mood:  Anxious  Affect:  brighter  Thought Process:  Goal Directed and Descriptions of Associations: Intact  Orientation:  Full (Time, Place, and Person)  Thought Content:  somatic  Suicidal Thoughts:  denies  Homicidal Thoughts:  No  Memory:  Immediate;   Fair Recent;   Fair Remote;   Fair  Judgement:  Poor  Insight:  Shallow  Psychomotor Activity:  Normal  Concentration:  Concentration: Fair and Attention Span: Fair  Recall:  [employer] of Knowledge:  Fair  Language:  Fair  Akathisia:  No  Handed:  Right  AIMS (if indicated):     Assets:  Communication Skills Desire for Improvement Housing Physical Health Resilience Social Support Vocational/Educational  ADL's:  Intact  Cognition:  WNL  Sleep:  Number of Hours: 5.5     Treatment Plan Summary: Daily contact with patient to assess and evaluate symptoms and progress in treatment and Medication management   Ms. Redditt is a 41 year old female with a history of depression admitted after overdose on in the context of relapse on  alcohol and medication noncompliance.  1. Suicidal ideation. The patient is able to contract for safety in the hospital.  2. Mood.  just restarted Effexor and Remeron.   3. PTSD. Cont   Minipress at night for nightmares. Cont  Neurontin and Vistaril for anxiety.  will give low dose Seroquel as needed for anxiety.  4. COPD. Albuterol is available.   5. Constipation. She is on Colace.   6. Metabolic syndrome monitoring. Lipid panel shows elevated triglycerides, TSH is high at 9.1, hemoglobin A1c pending. We ordered T4.  7. EKG. Normal sinus rhythm, QTc 472.  8. Pregnancy test not done. She has IUD.  9. Migraine headaches. Imitrex is available.   10. Disposition. She will be discharged to her apartment. She will follow up with Dr. Suzie Portela at [facility].  Beverly Sessions, MD 04/14/2017, 11:51 AMPatient ID: Sandy Mason, female   DOB: 1976-04-23, 41 y.o.   MRN: [id number] Patient ID: Sandy Mason, female   DOB: [birth year], 41 y.o.   MRN: [id number]

## 2017-04-14 NOTE — Plan of Care (Signed)
Problem: Education: Goal: Emotional status will improve Outcome: Progressing Patient reporting increased mood. Patient states she feels she puts too much pressure on herself at times. Psychosocial support provided. Patient has plan for discharge including outpatient groups and follow up visits.  Goal: Mental status will improve Outcome: Progressing Patient denies suicidal ideations and thoughts of self harm. Patient has insight and her judgement is improving.

## 2017-04-15 MED ORDER — PRAZOSIN HCL 2 MG PO CAPS
2.0000 mg | ORAL_CAPSULE | Freq: Every day | ORAL | Status: DC
  Administered 2017-04-15: 2 mg via ORAL
  Filled 2017-04-15 (×2): qty 1

## 2017-04-15 MED ORDER — PRAZOSIN HCL 2 MG PO CAPS: 2.0000 mg | ORAL_CAPSULE | Freq: Every day | ORAL | 1 refills | Status: DC

## 2017-04-15 MED ORDER — GABAPENTIN 300 MG PO CAPS: 900.0000 mg | ORAL_CAPSULE | Freq: Three times a day (TID) | ORAL | 1 refills | Status: DC

## 2017-04-15 MED ORDER — QUETIAPINE FUMARATE 25 MG PO TABS
25.0000 mg | ORAL_TABLET | Freq: Three times a day (TID) | ORAL | Status: DC
  Administered 2017-04-15 – 2017-04-16 (×5): 25 mg via ORAL
  Filled 2017-04-15 (×5): qty 1

## 2017-04-15 MED ORDER — QUETIAPINE FUMARATE 25 MG PO TABS: 25.0000 mg | ORAL_TABLET | Freq: Three times a day (TID) | ORAL | 1 refills | Status: DC

## 2017-04-15 NOTE — [facility] Group Notes (Signed)
[facility] Group Notes:  (Nursing/MHT/Case Management/Adjunct)  Date:  04/15/2017  Time:  4:38 PM  Type of Therapy:  Psychoeducational Skills  Participation Level:  Active  Participation Quality:  Appropriate, Attentive and Sharing  Affect:  Appropriate  Cognitive:  Alert and Appropriate  Insight:  Appropriate  Engagement in Group:  Engaged  Modes of Intervention:  Discussion, Education and Support  Summary of Progress/Problems:  Lynelle Smoke [facility] 04/15/2017, 4:38 PM

## 2017-04-15 NOTE — Plan of Care (Signed)
Problem: Education: Goal: Emotional status will improve Outcome: Progressing Pt stated she was doing great and was ready to leave physically and emotionally  Problem: Safety: Goal: Ability to disclose and discuss suicidal ideas will improve Outcome: Progressing Pt denies SI at this time

## 2017-04-15 NOTE — Progress Notes (Signed)
D: Pt denies SI/HI/AVH. Pt is pleasant and cooperative. Pt stated she was doing better and ready to leave. Pt appears to have good insight into her Tx and what to do if she is in crisis. Pt stated she was back to her humorous self and she heard from her supervisor today and is ready to get back to work later this week.   A: Pt was offered support and encouragement. Pt was given scheduled medications. Pt was encourage to attend groups. Q 15 minute checks were done for safety.   R:Pt attends groups and interacts well with peers and staff. Pt is taking medication. Pt has no complaints at this time .Pt receptive to treatment and safety maintained on unit.

## 2017-04-15 NOTE — Progress Notes (Signed)
Pleasant and cooperative. Smiles easily when approached.  Rates depression and anxiety as 5/10.  Denies SI/HI/AVH.  Verbalizes plan to continue outpatient therapy once discharged.  Presents frequently at the time pain medication is due.  Support and encouragement offered.  Safety maintained.

## 2017-04-15 NOTE — Progress Notes (Signed)
[facility] MD Progress Note  04/15/2017 12:47 PM Sandy Mason  MRN:  [id number]  Subjective:  Sandy Mason is a 41 year old female with history of depression, mood instability, and borderline personality disorder admitted after overdose while drunk. We continue her medications as prescribed by Dr.Moffit. No benzos.   9/16-Chart reviewed, discussed with nursing staff, pt seen.  Pt somatic at times, anxious, mood improving, denies SI. Slept better.   04/15/2017.  Sandy Mason reports some improvement but still feels depressed,. She is no longer suicidal and started participating in discharge planning. Apperently, she has been drug seeking while in the hospital. We did not offer any controlled substances. She is now asking for something to improve her energy. Low dose Seroquel has been helpful with anxiety. Good program participation.  Per nursing: Rog[place] cooperative with treatment, she was visible in the milieu interacting with peers, she spent most of the evening in the dayroom with peers watching t.v.  She was medication complaint on shift. No behavioral issues to report on shift at this time.   Principal Problem: Major depressive disorder, recurrent severe without psychotic features (HCC) Diagnosis:   Patient Active Problem List   Diagnosis Date Noted  . Major depressive disorder, recurrent severe without psychotic features (HCC) [F33.2] 04/11/2017  . Salicylate overdose [T39.091A] 04/09/2017  . AKI (acute kidney injury) (HCC) [N17.9] 04/03/2016  . Suicidal behavior [R46.89] 01/16/2016  . Alcohol intoxication (HCC) [F10.929]   . Severe episode of recurrent major depressive disorder, without psychotic features (HCC) [F33.2]   . Malingering [Z76.5] 12/16/2015  . Major depressive disorder, recurrent episode, moderate (HCC) [F33.1] 12/12/2015  . Closed pelvic fracture (HCC) [S32.9XXA] 10/31/2015  . Tobacco abuse [Z72.0] 10/31/2015  . Tobacco use disorder [F17.200] 03/28/2015  . PTSD  (post-traumatic stress disorder) [F43.10] 03/28/2015  . Stimulant use disorder (cocaine) [F15.90] 03/28/2015  . Sedative, hypnotic or anxiolytic use disorder, severe, dependence (HCC) [F13.20] 03/27/2015  . Opioid use disorder, severe, dependence (HCC) [F11.20] 12/20/2014  . Alcohol use disorder severe. [F10.20] 12/18/2014  . Borderline personality disorder [F60.3] 12/18/2014   Total Time spent with patient: 30 minutes  Past Psychiatric History: Depression, mood instability, and substance abuse.  Past Medical History:  Past Medical History:  Diagnosis Date  . Alcohol use disorder severe. 12/18/2014  . Anxiety   . Borderline personality disorder 12/18/2014  . CVA (cerebral infarction)   . Depression   . 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 Opioid use disorder, severe, dependence (HCC) 12/20/2014  . PTSD (post-traumatic stress disorder) 03/28/2015  . 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 Seizures (HCC)   . Suicide attempt by hanging [facility])    History reviewed. No pertinent surgical history. Family History:  Family History  Problem Relation Age of Onset  . Diabetes Father    Family Psychiatric  History: See H&P.  Social History:  History  Alcohol Use  . Yes     History  Drug Use No    Social History   Social History  . Marital status: Single    Spouse name: N/A  . Number of children: N/A  . Years of education: N/A   Social History Main Topics  . Smoking status: Current Every Day Smoker    Packs/day: 1.00    Types: Cigarettes  . Smokeless tobacco: Never Used  . Alcohol use Yes  . Drug use: No  . Sexual activity: Yes    Birth control/ protection: IUD   Other  Topics Concern  . None   Social History Narrative  . None   Additional Social History:                         Sleep: Fair  Appetite:  Fair  Current Medications: Current Facility-Administered  Medications  Medication Dose Route Frequency Provider Last Rate Last Dose  . acetaminophen (TYLENOL) tablet 650 mg  650 mg Oral Q6H PRN Clapacs, Jackquline Denmark, MD   650 mg at 04/15/17 0816  . albuterol (PROVENTIL HFA;VENTOLIN HFA) 108 (90 Base) MCG/ACT inhaler 2 puff  2 puff Inhalation Q6H PRN James Lafalce B, MD   2 puff at 04/14/17 1616  . alum & mag hydroxide-simeth (MAALOX/MYLANTA) 200-200-20 MG/5ML suspension 30 mL  30 mL Oral Q4H PRN Clapacs, John T, MD      . docusate sodium (COLACE) capsule 100 mg  100 mg Oral BID Clapacs, Jackquline Denmark, MD   100 mg at 04/15/17 0816  . gabapentin (NEURONTIN) capsule 900 mg  900 mg Oral TID Clapacs, Jackquline Denmark, MD   900 mg at 04/15/17 1204  . hydrOXYzine (ATARAX/VISTARIL) tablet 50 mg  50 mg Oral TID PRN Clapacs, Jackquline Denmark, MD   50 mg at 04/13/17 1456  . ibuprofen (ADVIL,MOTRIN) tablet 600 mg  600 mg Oral Q6H PRN Beverly Sessions, MD   600 mg at 04/15/17 0646  . magnesium hydroxide (MILK OF MAGNESIA) suspension 30 mL  30 mL Oral Daily PRN Clapacs, John T, MD      . mirtazapine (REMERON) tablet 15 mg  15 mg Oral QHS Clapacs, John T, MD   15 mg at 04/14/17 2129  . nicotine (NICODERM CQ - dosed in mg/24 hours) patch 21 mg  21 mg Transdermal Daily Aslin Farinas B, MD   21 mg at 04/15/17 0817  . prazosin (MINIPRESS) capsule 2 mg  2 mg Oral QHS Kazuto Sevey B, MD      . QUEtiapine (SEROQUEL) tablet 25 mg  25 mg Oral TID WC & HS Viren Lebeau B, MD   25 mg at 04/15/17 1204  . SUMAtriptan (IMITREX) tablet 50 mg  50 mg Oral Q2H PRN Vishwa Dais B, MD   50 mg at 04/15/17 0645  . thiamine (VITAMIN B-1) tablet 100 mg  100 mg Oral Daily Clapacs, Jackquline Denmark, MD   100 mg at 04/15/17 0817  . venlafaxine XR (EFFEXOR-XR) 24 hr capsule 300 mg  300 mg Oral Q breakfast Clapacs, Jackquline Denmark, MD   300 mg at 04/15/17 1610    Lab Results:  No results found for this or any previous visit (from the past 48 hour(s)).  Blood Alcohol level:  Lab Results  Component Value Date    ETH 244 (H) 04/08/2017   ETH 181 (H) 04/16/2016    Metabolic Disorder Labs: Lab Results  Component Value Date   HGBA1C 5.8 (H) 04/12/2017   MPG 119.76 04/12/2017   MPG 116.89 04/09/2017   Lab Results  Component Value Date   PROLACTIN 98.1 (H) 12/12/2015   Lab Results  Component Value Date   CHOL 178 04/12/2017   TRIG 297 (H) 04/12/2017   HDL 39 (L) 04/12/2017   CHOLHDL 4.6 04/12/2017   VLDL 59 (H) 04/12/2017   LDLCALC 80 04/12/2017   LDLCALC 89 12/12/2015    Physical Findings: AIMS:  , ,  ,  ,    CIWA:  CIWA-Ar Total: 1 COWS:     Musculoskeletal: Strength & Muscle Tone: within  normal limits Gait & Station: normal Patient leans: N/A  Psychiatric Specialty Exam: Physical Exam  Nursing note and vitals reviewed. Psychiatric: Her speech is normal and behavior is normal. Her mood appears anxious. Cognition and memory are normal. She expresses impulsivity. She expresses suicidal ideation. She expresses suicidal plans.    Review of Systems  Psychiatric/Behavioral: Positive for depression, substance abuse and suicidal ideas. The patient is nervous/anxious and has insomnia.   All other systems reviewed and are negative.   Blood pressure 122/74, pulse 68, temperature 98.5 F (36.9 C), temperature source Oral, resp. rate 18, height  (1.727 m), weight 96.6 kg (213 lb), SpO2 99 %.Body mass index is 32.39 kg/m.  General Appearance: Casual  Eye Contact:  Good  Speech:  Clear and Coherent  Volume:  Normal  Mood:  Anxious  Affect:  brighter  Thought Process:  Goal Directed and Descriptions of Associations: Intact  Orientation:  Full (Time, Place, and Person)  Thought Content:  somatic  Suicidal Thoughts:  denies  Homicidal Thoughts:  No  Memory:  Immediate;   Fair Recent;   Fair Remote;   Fair  Judgement:  Poor  Insight:  Shallow  Psychomotor Activity:  Normal  Concentration:  Concentration: Fair and Attention Span: Fair  Recall:  [employer] of Knowledge:  Fair   Language:  Fair  Akathisia:  No  Handed:  Right  AIMS (if indicated):     Assets:  Communication Skills Desire for Improvement Housing Physical Health Resilience Social Support Vocational/Educational  ADL's:  Intact  Cognition:  WNL  Sleep:  Number of Hours: 7     Treatment Plan Summary: Daily contact with patient to assess and evaluate symptoms and progress in treatment and Medication management   Sandy Mason is a 41 year old female with a history of depression admitted after overdose on in the context of relapse on alcohol and medication noncompliance.  1. Suicidal ideation. The patient is able to contract for safety in the hospital.  2. Mood.  We restarted Effexor and Remeron for depression.   3. PTSD. We continued Minipress at night for nightmares, Neurontin and Vistaril, We added low dose Seroquel for anxiety. I will increase it to qid.   4. COPD. Albuterol is available.   5. Constipation. She is on Colace.   6. Metabolic syndrome monitoring. Lipid panel shows elevated triglycerides, TSH is high at 9.1 with normal free T4, hemoglobin A1c 5.8.   7. EKG. Normal sinus rhythm, QTc 472.  8. Pregnancy test not done. She has IUD.  9. Migraine headaches. Imitrex is available.   10. Disposition. She will be discharged to her apartment. She will follow up with Dr. Suzie Portela at [facility].  Kristine Linea, MD

## 2017-04-15 NOTE — Plan of Care (Signed)
Problem: Coping: Goal: Ability to verbalize frustrations and anger appropriately will improve Outcome: Progressing Verbalizes feelings without difficulty.     

## 2017-04-16 NOTE — Progress Notes (Signed)
Patient up on the unit she is social with select peers. She denies si, hi,avh. She denies pain. She states she is ready to go home. She has no behavior issues. Will continue to monitor.

## 2017-04-16 NOTE — [facility] Suicide Risk Assessment (Signed)
[facility] Discharge Suicide Risk Assessment   Principal Problem: Major depressive disorder, recurrent severe without psychotic features [facility]) Discharge Diagnoses:  Patient Active Problem List   Diagnosis Date Noted  . Major depressive disorder, recurrent severe without psychotic features (HCC) [F33.2] 04/11/2017  . Salicylate overdose [T39.091A] 04/09/2017  . AKI (acute kidney injury) (HCC) [N17.9] 04/03/2016  . Suicidal behavior [R46.89] 01/16/2016  . Alcohol intoxication (HCC) [F10.929]   . Severe episode of recurrent major depressive disorder, without psychotic features (HCC) [F33.2]   . Malingering [Z76.5] 12/16/2015  . Major depressive disorder, recurrent episode, moderate (HCC) [F33.1] 12/12/2015  . Closed pelvic fracture (HCC) [S32.9XXA] 10/31/2015  . Tobacco abuse [Z72.0] 10/31/2015  . Tobacco use disorder [F17.200] 03/28/2015  . PTSD (post-traumatic stress disorder) [F43.10] 03/28/2015  . Stimulant use disorder (cocaine) [F15.90] 03/28/2015  . Sedative, hypnotic or anxiolytic use disorder, severe, dependence (HCC) [F13.20] 03/27/2015  . Opioid use disorder, severe, dependence (HCC) [F11.20] 12/20/2014  . Alcohol use disorder severe. [F10.20] 12/18/2014  . Borderline personality disorder [F60.3] 12/18/2014    Total Time spent with patient: 30 minutes  Musculoskeletal: Strength & Muscle Tone: within normal limits Gait & Station: normal Patient leans: N/A  Psychiatric Specialty Exam: Review of Systems  Psychiatric/Behavioral: The patient is nervous/anxious.   All other systems reviewed and are negative.   Blood pressure 109/64, pulse 72, temperature 97.9 F (36.6 C), temperature source Oral, resp. rate 18, height  (1.727 m), weight 96.6 kg (213 lb), SpO2 99 %.Body mass index is 32.39 kg/m.  General Appearance: Casual  Eye Contact::  Good  Speech:  Clear and Coherent409  Volume:  Normal  Mood:  Euthymic  Affect:  Appropriate  Thought Process:  Goal Directed and  Descriptions of Associations: Intact  Orientation:  Full (Time, Place, and Person)  Thought Content:  WDL  Suicidal Thoughts:  No  Homicidal Thoughts:  No  Memory:  Immediate;   Fair Recent;   Fair Remote;   Fair  Judgement:  Impaired  Insight:  Fair and Shallow  Psychomotor Activity:  Normal  Concentration:  Fair  Recall:  [employer] of Knowledge:Fair  Language: Fair  Akathisia:  No  Handed:  Right  AIMS (if indicated):     Assets:  Communication Skills Desire for Improvement Financial Resources/Insurance Housing Physical Health Resilience Social Support Vocational/Educational  Sleep:  Number of Hours: 8  Cognition: WNL  ADL's:  Intact   Mental Status Per Nursing Assessment::   On Admission:     Demographic Factors:  Divorced or widowed, Caucasian and Living alone  Loss Factors: Loss of significant relationship  Historical Factors: Prior suicide attempts, Family history of mental illness or substance abuse and Impulsivity  Risk Reduction Factors:   Responsible for children under 73 years of age, Sense of responsibility to family, Employed, Positive social support and Positive therapeutic relationship  Continued Clinical Symptoms:  Severe Anxiety and/or Agitation Depression:   Comorbid alcohol abuse/dependence Impulsivity Alcohol/Substance Abuse/Dependencies Personality Disorders:   Cluster B  Cognitive Features That Contribute To Risk:  None    Suicide Risk:  Minimal: No identifiable suicidal ideation.  Patients presenting with no risk factors but with morbid ruminations; may be classified as minimal risk based on the severity of the depressive symptoms    Plan Of Care/Follow-up recommendations:  Activity:  as tolerated. Diet:  low sodium heart healthy. Other:  keep follow up appointments.  Kristine Linea, MD 04/16/2017, 10:23 AM

## 2017-04-16 NOTE — Progress Notes (Signed)
Patient dc to peer. She is excited and walks to front with all belongings returned.

## 2017-04-16 NOTE — Progress Notes (Signed)
Patient ID: Sandy Mason, female   DOB: 1976/07/08, 41 y.o.   MRN: [id number] LCSW Group Therapy Note 04/16/2017 9:00am  Type of Therapy and Topic:  Group Therapy:  Setting Goals  Participation Level:  Active  Description of Group: In this process group, patients discussed using strengths to work toward goals and address challenges.  Patients identified two positive things about themselves and one goal they were working on.  Patients were given the opportunity to share openly and support each other's plan for self-empowerment.  The group discussed the value of gratitude and were encouraged to have a daily reflection of positive characteristics or circumstances.  Patients were encouraged to identify a plan to utilize their strengths to work on current challenges and goals.  Therapeutic Goals 1. Patient will verbalize personal strengths/positive qualities and relate how these can assist with achieving desired personal goals 2. Patients will verbalize affirmation of peers plans for personal change and goal setting 3. Patients will explore the value of gratitude and positive focus as related to successful achievement of goals 4. Patients will verbalize a plan for regular reinforcement of personal positive qualities and circumstances.  Summary of Patient Progress:  Pt able to meet therapeutic goals listed above.  Pt verbalizes that her goal for the day is to be discharged and to immediately follow up on getting her medications.     Therapeutic Modalities Cognitive Behavioral Therapy Motivational Interviewing    Glennon Mac, LCSW 04/16/2017 2:40 PM

## 2017-04-16 NOTE — [facility] Suicide Risk Assessment (Signed)
[facility] INPATIENT:  Family/Significant Other Suicide Prevention Education  Suicide Prevention Education:  Contact Attempts: Lambert Keto, [phone], (name of family member/significant other) has been identified by the patient as the family member/significant other with whom the patient will be residing, and identified as the person(s) who will aid the patient in the event of a mental health crisis.  With written consent from the patient, two attempts were made to provide suicide prevention education, prior to and/or following the patient's discharge.  We were unsuccessful in providing suicide prevention education.  A suicide education pamphlet was given to the patient to share with family/significant other.  Date and time of first attempt:04/13/17, 1249 Date and time of second attempt:04/16/17 10:30am  Glennon Mac, LCSW 04/16/2017, 10:29 AM

## 2017-04-16 NOTE — Progress Notes (Signed)
Patient has called her ride. States she cant come til one. She has signed paper work with verbal understanding.

## 2017-04-16 NOTE — Discharge Summary (Signed)
Physician Discharge Summary Note  Patient:  Sandy Mason is an 41 y.o., female MRN:  [id number] DOB:  [birth year] Patient phone:  [phone] (home)  Patient address:   [address] [place] [address],  Total Time spent with patient: 30 minutes  Date of Admission:  04/11/2017 Date of Discharge: 04/16/2017  Reason for Admission:  Suicidal ideation.  Identifying data. Ms. Sandy Mason is a 41 year old female with a history of depression, mood instability, and substance use.  Chief complaint. "I don't remember."  History of present illness. Information was obtained from the patient and the chart. The patient was brought to the emergency room by EMS with unsteady gait. She denies suicidal or homicidal ideation but was discovered to have elevated level of salicylates. She was briefly admitted to ICU. The patient does not remember overdosing reports that she has been lately very upset for not able to see her 7-year-old daughter. She made arrangement with her ex-husband to have a child for several days in July but instead the child went with her mother. She's been ruminating over 4 weeks, because he became increasingly depressed and anxious, stopped taking her medications for 5 weeks, and ended up in the hospital. At the time of overdose she was drunk. She reports that she had a-year-old sobriety and started drinking on the night of admission because she was upset with her case worker. She reached out to [facility] peers support but indifferent worker was sent to talk to her and she ended up arguing with her peer. She reports poor sleep, decreased appetite, anhedonia, feeling of guilt and hopelessness worthlessness, poor energy and concentration, social isolation crying spells, high anxiety, and relapse on alcohol. She denies psychotic symptoms or symptoms suggestive of bipolar mania. She does not drink or use alcohol except for the time of admission. The patient states his that she's been doing very  well for the past year participating in DBT treatment, working with peers support, and Dr. Suzie Mason. When she takes medications, they are very helpful.  Past psychiatric history. The patient has a long history of depression, mood instability, suicide attempts, and multiple psychiatric hospitalizations. She has not been hospitalized in the past year and is very proud of it. She carries a diagnosis of depression, PTSD, and borderline personality disorder.  Family psychiatric history. Other family members with depression and anxiety. Social history. She lives independently in her apartment. She supports herself as a Conservation officer, nature at [employer]. Her 48-year-old daughter lives with her father and the patient has only limited opportunity to see her. Last time she saw her daughter was in January 2018.  Principal Problem: Major depressive disorder, recurrent severe without psychotic features [facility]) Discharge Diagnoses: Patient Active Problem List   Diagnosis Date Noted  . Major depressive disorder, recurrent severe without psychotic features (HCC) [F33.2] 04/11/2017  . Salicylate overdose [T39.091A] 04/09/2017  . AKI (acute kidney injury) (HCC) [N17.9] 04/03/2016  . Suicidal behavior [R46.89] 01/16/2016  . Alcohol intoxication (HCC) [F10.929]   . Severe episode of recurrent major depressive disorder, without psychotic features (HCC) [F33.2]   . Malingering [Z76.5] 12/16/2015  . Major depressive disorder, recurrent episode, moderate (HCC) [F33.1] 12/12/2015  . Closed pelvic fracture (HCC) [S32.9XXA] 10/31/2015  . Tobacco abuse [Z72.0] 10/31/2015  . Tobacco use disorder [F17.200] 03/28/2015  . PTSD (post-traumatic stress disorder) [F43.10] 03/28/2015  . Stimulant use disorder (cocaine) [F15.90] 03/28/2015  . Sedative, hypnotic or anxiolytic use disorder, severe, dependence (HCC) [F13.20] 03/27/2015  . Opioid use disorder, severe, dependence (HCC) [  F11.20] 12/20/2014  . Alcohol use disorder severe.  [F10.20] 12/18/2014  . Borderline personality disorder [F60.3] 12/18/2014    Past Medical History:  Past Medical History:  Diagnosis Date  . Alcohol use disorder severe. 12/18/2014  . Anxiety   . Borderline personality disorder 12/18/2014  . CVA (cerebral infarction)   . Depression   . 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 Opioid use disorder, severe, dependence (HCC) 12/20/2014  . PTSD (post-traumatic stress disorder) 03/28/2015  . 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 Seizures (HCC)   . Suicide attempt by hanging [facility])    History reviewed. No pertinent surgical history. Family History:  Family History  Problem Relation Age of Onset  . Diabetes Father    Social History:  History  Alcohol Use  . Yes     History  Drug Use No    Social History   Social History  . Marital status: Single    Spouse name: N/A  . Number of children: N/A  . Years of education: N/A   Social History Main Topics  . Smoking status: Current Every Day Smoker    Packs/day: 1.00    Types: Cigarettes  . Smokeless tobacco: Never Used  . Alcohol use Yes  . Drug use: No  . Sexual activity: Yes    Birth control/ protection: IUD   Other Topics Concern  . None   Social History Narrative  . None    Hospital Course:    Ms. Sandy Mason is a 41 year old female with a history of depression and mood instability admitted after overdose on ASA in the context of relapse on alcohol and medication noncompliance.  1. Suicidal ideation. Resolved. The patient is able to contract for safety. She is forward thinking and optimistic about the future. She is a loving mother.   2. Mood.  We restarted Effexor and Remeron for depression. We added Seroquel for mood stabilization.  3. PTSD. We restarted Minipress at night for nightmares and Neurontin.  4. COPD. Albuterol is available.   5. Constipation. She is on Colace.    6. Metabolic syndrome monitoring. Lipid panel shows elevated triglycerides, TSH is high at 9.1 with normal T4, hemoglobin A1c 5.8.   7. EKG. Normal sinus rhythm, QTc 472.  8. Pregnancy test not done. She has IUD.  9. Migraine headaches. Imitrex was available.   10. Smoking. Nicotine patch is available.  11. Disposition. She was discharged to her apartment. She will follow up with Dr. Suzie [facility] at [facility].  Physical Findings: AIMS:  , ,  ,  ,    CIWA:  CIWA-Ar Total: 1 COWS:     Musculoskeletal: Strength & Muscle Tone: within normal limits Gait & Station: normal Patient leans: N/A  Psychiatric Specialty Exam: Physical Exam  Nursing note and vitals reviewed. Psychiatric: She has a normal mood and affect. Her speech is normal and behavior is normal. Thought content normal. Cognition and memory are normal. She expresses impulsivity.    Review of Systems  Psychiatric/Behavioral: Positive for substance abuse. The patient is nervous/anxious.   All other systems reviewed and are negative.   Blood pressure 109/64, pulse 72, temperature 97.9 F (36.6 C), temperature source Oral, resp. rate 18, height  (1.727 m), weight 96.6 kg (213 lb), SpO2 99 %.Body mass index is 32.39 kg/m.  General Appearance: Casual  Eye Contact:  Good  Speech:  Clear and Coherent  Volume:  Normal  Mood:  Anxious  Affect:  Appropriate  Thought Process:  Goal Directed and Descriptions of Associations: Intact  Orientation:  Full (Time, Place, and Person)  Thought Content:  WDL  Suicidal Thoughts:  No  Homicidal Thoughts:  No  Memory:  Immediate;   Fair Recent;   Fair Remote;   Fair  Judgement:  Fair  Insight:  Shallow  Psychomotor Activity:  Normal  Concentration:  Concentration: Fair and Attention Span: Fair  Recall:  [employer] of Knowledge:  Fair  Language:  Fair  Akathisia:  No  Handed:  Right  AIMS (if indicated):     Assets:  Communication Skills Desire for Improvement Financial  Resources/Insurance Housing Physical Health Resilience Social Support Vocational/Educational  ADL's:  Intact  Cognition:  WNL  Sleep:  Number of Hours: 8     Have you used any form of tobacco in the last 30 days? (Cigarettes, Smokeless Tobacco, Cigars, and/or Pipes): Yes  Has this patient used any form of tobacco in the last 30 days? (Cigarettes, Smokeless Tobacco, Cigars, and/or Pipes) Yes, Yes, A prescription for an FDA-approved tobacco cessation medication was offered at discharge and the patient refused  Blood Alcohol level:  Lab Results  Component Value Date   ETH 244 (H) 04/08/2017   ETH 181 (H) 04/16/2016    Metabolic Disorder Labs:  Lab Results  Component Value Date   HGBA1C 5.8 (H) 04/12/2017   MPG 119.76 04/12/2017   MPG 116.89 04/09/2017   Lab Results  Component Value Date   PROLACTIN 98.1 (H) 12/12/2015   Lab Results  Component Value Date   CHOL 178 04/12/2017   TRIG 297 (H) 04/12/2017   HDL 39 (L) 04/12/2017   CHOLHDL 4.6 04/12/2017   VLDL 59 (H) 04/12/2017   LDLCALC 80 04/12/2017   LDLCALC 89 12/12/2015    See Psychiatric Specialty Exam and Suicide Risk Assessment completed by Attending Physician prior to discharge.  Discharge destination:  Home  Is patient on multiple antipsychotic therapies at discharge:  No   Has Patient had three or more failed trials of antipsychotic monotherapy by history:  No  Recommended Plan for Multiple Antipsychotic Therapies: NA  Discharge Instructions    Diet - low sodium heart healthy    Complete by:  As directed    Increase activity slowly    Complete by:  As directed      Allergies as of 04/16/2017      Reactions   Depakote [valproic Acid] Anaphylaxis, Other (See Comments)   Reaction:  Seizures    Haldol [haloperidol Lactate] Anaphylaxis, Other (See Comments)   Reaction:  Seizures    Keflet [cephalexin] Rash      Medication List    STOP taking these medications   dicyclomine 20 MG tablet Commonly  known as:  BENTYL   lidocaine 5 % Commonly known as:  LIDODERM   metoprolol tartrate 25 MG tablet Commonly known as:  LOPRESSOR   multivitamin with minerals Tabs tablet   ondansetron 4 MG disintegrating tablet Commonly known as:  ZOFRAN ODT   ondansetron 4 MG tablet Commonly known as:  ZOFRAN   promethazine 12.5 MG tablet Commonly known as:  PHENERGAN     TAKE these medications     Indication  albuterol 108 (90 Base) MCG/ACT inhaler Commonly known as:  PROVENTIL HFA;VENTOLIN HFA Inhale 2 puffs into the lungs every 6 (six) hours as needed for wheezing or shortness of breath.  Indication:  Asthma   famotidine 20 MG tablet Commonly known as:  PEPCID Take 20 mg by mouth 2 (two) times daily.  Indication:  Gastroesophageal Reflux Disease   gabapentin 300 MG capsule Commonly known as:  NEURONTIN Take 3 capsules (900 mg total) by mouth 3 (three) times daily. What changed:  how much to take  Indication:  Social Anxiety Disorder   mirtazapine 15 MG tablet Commonly known as:  REMERON Take 1 tablet (15 mg total) by mouth at bedtime.  Indication:  Major Depressive Disorder   prazosin 2 MG capsule Commonly known as:  MINIPRESS Take 1 capsule (2 mg total) by mouth at bedtime.  Indication:  PTSD   QUEtiapine 25 MG tablet Commonly known as:  SEROQUEL Take 1 tablet (25 mg total) by mouth 4 (four) times daily -  with meals and at bedtime.  Indication:  Depressive Phase of Manic-Depression   venlafaxine XR 150 MG 24 hr capsule Commonly known as:  EFFEXOR-XR Take 2 capsules (300 mg total) by mouth daily with breakfast.  Indication:  Major Depressive Disorder        Follow-up recommendations:  Activity:  as tolerated. Diet:  low sodium heart healthy. Other:  keep follow up appointments.  Comments:    Signed: Kristine Linea, MD 04/16/2017, 10:23 AM

## 2017-04-17 NOTE — Progress Notes (Signed)
  [facility] Adult Case Management Discharge Plan :  Will you be returning to the same living situation after discharge:  Yes,    At discharge, do you have transportation home?: Yes,    Do you have the ability to pay for your medications: Yes,     Release of information consent forms completed and in the chart;  Patient's signature needed at discharge.  Patient to Follow up at: Follow-up Information    [facility] Go on 04/19/2017.   Why:  8:00am for Hospital follow up for Medication Management Contact information: [name] [facility] [address] [phone]           Next level of care provider has access to [facility] Link:no  Safety Planning and Suicide Prevention discussed: Yes,     Have you used any form of tobacco in the last 30 days? (Cigarettes, Smokeless Tobacco, Cigars, and/or Pipes): Yes  Has patient been referred to the Quitline?: Patient refused referral  Patient has been referred for addiction treatment: Yes  Glennon Mac, LCSW 04/17/2017, 4:42 PM

## 2017-05-02 ENCOUNTER — Emergency Department
Admission: EM | Admit: 2017-05-02 | Discharge: 2017-05-02 | Disposition: A | Discharge status: Home / Self Care | Payer: Medicaid Other | Attending: Emergency Medicine | Admitting: Emergency Medicine

## 2017-05-02 ENCOUNTER — Encounter: Payer: Self-pay | Admitting: Intensive Care

## 2017-05-02 DIAGNOSIS — F1120 Opioid dependence, uncomplicated: Secondary | ICD-10-CM | POA: Diagnosis not present

## 2017-05-02 DIAGNOSIS — G2571 Drug induced akathisia: Secondary | ICD-10-CM | POA: Diagnosis not present

## 2017-05-02 DIAGNOSIS — Z8673 Personal history of transient ischemic attack (TIA), and cerebral infarction without residual deficits: Secondary | ICD-10-CM | POA: Diagnosis not present

## 2017-05-02 DIAGNOSIS — F419 Anxiety disorder, unspecified: Secondary | ICD-10-CM | POA: Diagnosis not present

## 2017-05-02 DIAGNOSIS — B349 Viral infection, unspecified: Secondary | ICD-10-CM | POA: Diagnosis not present

## 2017-05-02 DIAGNOSIS — F17210 Nicotine dependence, cigarettes, uncomplicated: Secondary | ICD-10-CM | POA: Insufficient documentation

## 2017-05-02 DIAGNOSIS — T43225A Adverse effect of selective serotonin reuptake inhibitors, initial encounter: Secondary | ICD-10-CM | POA: Diagnosis not present

## 2017-05-02 DIAGNOSIS — F329 Major depressive disorder, single episode, unspecified: Secondary | ICD-10-CM | POA: Insufficient documentation

## 2017-05-02 DIAGNOSIS — Z79899 Other long term (current) drug therapy: Secondary | ICD-10-CM | POA: Insufficient documentation

## 2017-05-02 DIAGNOSIS — F603 Borderline personality disorder: Secondary | ICD-10-CM | POA: Insufficient documentation

## 2017-05-02 LAB — URINE DRUG SCREEN, QUALITATIVE (ARMC ONLY)
Amphetamines, Ur Screen: NOT DETECTED
BARBITURATES, UR SCREEN: NOT DETECTED
Benzodiazepine, Ur Scrn: NOT DETECTED
CANNABINOID 50 NG, UR ~~LOC~~: NOT DETECTED
COCAINE METABOLITE, UR ~~LOC~~: NOT DETECTED
MDMA (ECSTASY) UR SCREEN: NOT DETECTED
Methadone Scn, Ur: NOT DETECTED
Opiate, Ur Screen: NOT DETECTED
PHENCYCLIDINE (PCP) UR S: NOT DETECTED
Tricyclic, Ur Screen: NOT DETECTED

## 2017-05-02 LAB — CBC
HCT: 41.5 % (ref 35.0–47.0)
HEMOGLOBIN: 14.2 g/dL (ref 12.0–16.0)
MCH: 31.7 pg (ref 26.0–34.0)
MCHC: 34.1 g/dL (ref 32.0–36.0)
MCV: 92.7 fL (ref 80.0–100.0)
Platelets: 409 10*3/uL (ref 150–440)
RBC: 4.48 MIL/uL (ref 3.80–5.20)
RDW: 13.7 % (ref 11.5–14.5)
WBC: 8.6 10*3/uL (ref 3.6–11.0)

## 2017-05-02 LAB — SALICYLATE LEVEL

## 2017-05-02 LAB — COMPREHENSIVE METABOLIC PANEL
ALBUMIN: 4.6 g/dL (ref 3.5–5.0)
ALT: 17 U/L (ref 14–54)
AST: 21 U/L (ref 15–41)
Alkaline Phosphatase: 53 U/L (ref 38–126)
Anion gap: 14 (ref 5–15)
BUN: 7 mg/dL (ref 6–20)
CALCIUM: 9.2 mg/dL (ref 8.9–10.3)
CO2: 23 mmol/L (ref 22–32)
Chloride: 109 mmol/L (ref 101–111)
Creatinine, Ser: 0.65 mg/dL (ref 0.44–1.00)
GFR calc non Af Amer: >60 mL/min (ref >60)
GLUCOSE: 113 mg/dL — AB (ref 65–99)
POTASSIUM: 2.9 mmol/L — AB (ref 3.5–5.1)
SODIUM: 146 mmol/L — AB (ref 135–145)
Total Bilirubin: 0.4 mg/dL (ref 0.3–1.2)
Total Protein: 8.6 g/dL — ABNORMAL HIGH (ref 6.5–8.1)

## 2017-05-02 LAB — URINALYSIS, COMPLETE (UACMP) WITH MICROSCOPIC
BACTERIA UA: NONE SEEN
BILIRUBIN URINE: NEGATIVE
Glucose, UA: NEGATIVE mg/dL
Hgb urine dipstick: NEGATIVE
KETONES UR: NEGATIVE mg/dL
Leukocytes, UA: NEGATIVE
Nitrite: NEGATIVE
PROTEIN: 100 mg/dL — AB
Specific Gravity, Urine: 1.018 (ref 1.005–1.030)
pH: 6 (ref 5.0–8.0)

## 2017-05-02 LAB — ACETAMINOPHEN LEVEL

## 2017-05-02 LAB — ETHANOL: Alcohol, Ethyl (B): 205 mg/dL — ABNORMAL HIGH (ref <10)

## 2017-05-02 LAB — PREGNANCY, URINE: Preg Test, Ur: NEGATIVE

## 2017-05-02 MED ORDER — ONDANSETRON 4 MG PO TBDP
4.0000 mg | ORAL_TABLET | Freq: Once | ORAL | Status: AC
  Administered 2017-05-02: 4 mg via ORAL
  Filled 2017-05-02: qty 1

## 2017-05-02 MED ORDER — CLONAZEPAM 0.5 MG PO TABS
0.5000 mg | ORAL_TABLET | Freq: Once | ORAL | Status: AC
  Administered 2017-05-02: 0.5 mg via ORAL
  Filled 2017-05-02: qty 1

## 2017-05-02 MED ORDER — QUETIAPINE FUMARATE 25 MG PO TABS: 12.5000 mg | ORAL_TABLET | Freq: Three times a day (TID) | ORAL | 0 refills | Status: DC

## 2017-05-02 MED ORDER — VENLAFAXINE HCL ER 150 MG PO CP24: 150.0000 mg | ORAL_CAPSULE | Freq: Every day | ORAL | 0 refills | Status: DC

## 2017-05-02 MED ORDER — ONDANSETRON 4 MG PO TBDP
4.0000 mg | ORAL_TABLET | Freq: Once | ORAL | Status: AC
Start: 2017-05-02 — End: 2017-05-02
  Administered 2017-05-02: 4 mg via ORAL
  Filled 2017-05-02: qty 1

## 2017-05-02 MED ORDER — ACETAMINOPHEN 325 MG PO TABS
650.0000 mg | ORAL_TABLET | Freq: Once | ORAL | Status: AC
  Administered 2017-05-02: 650 mg via ORAL
  Filled 2017-05-02: qty 2

## 2017-05-02 MED ORDER — POTASSIUM CHLORIDE CRYS ER 20 MEQ PO TBCR
40.0000 meq | EXTENDED_RELEASE_TABLET | Freq: Once | ORAL | Status: AC
  Administered 2017-05-02: 40 meq via ORAL
  Filled 2017-05-02: qty 2

## 2017-05-02 NOTE — ED Notes (Signed)
Hard copy of discharge signature sent to med. records

## 2017-05-02 NOTE — ED Notes (Signed)
Telepsych MD talking to pt.

## 2017-05-02 NOTE — ED Provider Notes (Signed)
Patient was signed out to me by Dr. Loreli Dollar. 41 year old female who presented with flulike symptoms. She was medically cleared and evaluated by psychiatry who felt her symptoms were most consistent with akathisiafrom her psychiatric medications. Multiple recommendations were made, which I have modified both in her chart and with new prescriptions. At this time, the patient is stable for discharge home. Return precautions and follow-up instructions were discussed   Sandy Menghini, MD 05/02/17 2118

## 2017-05-02 NOTE — ED Notes (Signed)
meds given.  Pt alert.  Calm and cooperative.   

## 2017-05-02 NOTE — ED Notes (Signed)
meds given for nausea.  

## 2017-05-02 NOTE — ED Notes (Signed)
Pt states she thinks she has been flighting the flu for over 1 week, states feeling anxious when she thinks about being sick, tearful

## 2017-05-02 NOTE — ED Triage Notes (Addendum)
Patient arrived by EMS for anxiety and flu like symptoms X1 week. Tearful in triage stating "I just have this anxiety because I do not know whats wrong with me and felt like I was running fevers and no energy last week" HX anxiety, depression, substance abuse. Denies SI or HI

## 2017-05-02 NOTE — ED Provider Notes (Signed)
[facility] Emergency Department Provider Note  ____________________________________________   First MD Initiated Contact with Patient 05/02/17 1728     (approximate)  I have reviewed the triage vital signs and the nursing notes.   HISTORY  Chief Complaint Anxiety   HPI Sandy Mason is a 41 y.o. female with a history of major depressive disorder, opiate use, seizures and a suicide attempt by hanging who is presenting to the emergency Department with anxiety. She says that over the past week she has had several episodes of vomiting as well as body aches and weakness. She says that she was thinking she had the flu. Subjective fever as well. 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 Does not report diarrhea. Says that she is also been very anxious but is not having suicidal thoughts at this time. Says that she also drank last night.Also says that she just recently restarted her citalopram about 2 weeks ago and thinks she may be having side effects from this medication causing her current symptoms.   Past Medical History:  Diagnosis Date  . Alcohol use disorder severe. 12/18/2014  . Anxiety   . Borderline personality disorder (HCC) 12/18/2014  . CVA (cerebral infarction)   . Depression   . 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 Opioid use disorder, severe, dependence (HCC) 12/20/2014  . PTSD (post-traumatic stress disorder) 03/28/2015  . 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 Seizures (HCC)   . Suicide attempt by hanging [facility])     Patient Active Problem List   Diagnosis Date Noted  . Major depressive disorder, recurrent severe without psychotic features (HCC) 04/11/2017  . Salicylate overdose 04/09/2017  . AKI (acute kidney injury) (HCC) 04/03/2016  . Suicidal behavior 01/16/2016  . Alcohol intoxication (HCC)   . Severe episode of  recurrent major depressive disorder, without psychotic features (HCC)   . Malingering 12/16/2015  . Major depressive disorder, recurrent episode, moderate (HCC) 12/12/2015  . Closed pelvic fracture (HCC) 10/31/2015  . Tobacco abuse 10/31/2015  . Tobacco use disorder 03/28/2015  . PTSD (post-traumatic stress disorder) 03/28/2015  . Stimulant use disorder (cocaine) 03/28/2015  . 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 Opioid use disorder, severe, dependence (HCC) 12/20/2014  . Alcohol use disorder severe. 12/18/2014  . Borderline personality disorder (HCC) 12/18/2014    History reviewed. No pertinent surgical history.  Prior to Admission medications   Medication Sig Start Date End Date Taking? Authorizing Provider  albuterol (PROVENTIL HFA;VENTOLIN HFA) 108 (90 Base) MCG/ACT inhaler Inhale 2 puffs into the lungs every 6 (six) hours as needed for wheezing or shortness of breath. 03/19/17   Willy Eddy, MD  famotidine (PEPCID) 20 MG tablet Take 20 mg by mouth 2 (two) times daily.    [provider]  gabapentin (NEURONTIN) 300 MG capsule Take 3 capsules (900 mg total) by mouth 3 (three) times daily. 04/16/17   Pucilowska, Jolanta B, MD  mirtazapine (REMERON) 15 MG tablet Take 1 tablet (15 mg total) by mouth at bedtime. 02/16/16   Clapacs, Jackquline Denmark, MD  prazosin (MINIPRESS) 2 MG capsule Take 1 capsule (2 mg total) by mouth at bedtime. 04/15/17   Pucilowska, Ellin Goodie, MD  QUEtiapine (SEROQUEL) 25 MG tablet Take 1 tablet (25 mg total) by mouth 4 (four) times daily -  with meals and at bedtime. 04/15/17   Shari Prows, MD  venlafaxine XR (EFFEXOR-XR) 150 MG 24 hr capsule Take 2 capsules (300 mg total) by mouth daily with breakfast. 02/16/16   Clapacs, Jackquline Denmark, MD    Allergies Depakote [valproic acid]; Haldol [haloperidol lactate]; and Keflet [cephalexin]  Family History  Problem Relation Age of Onset  . Diabetes Father     Social  History Social History  Substance Use Topics  . Smoking status: Current Every Day Smoker    Packs/day: 1.00    Types: Cigarettes  . Smokeless tobacco: Never Used  . Alcohol use Yes    Review of Systems  Constitutional: fever Eyes: No visual changes. ENT: No sore throat. Cardiovascular: Denies chest pain. Respiratory: Denies shortness of breath. Gastrointestinal: No abdominal pain.   No diarrhea.  No constipation. Genitourinary: Negative for dysuria. Musculoskeletal: Negative for back pain. Skin: Negative for rash. Neurological: Negative for headaches, focal weakness or numbness.   ____________________________________________   PHYSICAL EXAM:  VITAL SIGNS: ED Triage Vitals [05/02/17 1508]  Enc Vitals Group     BP 129/75     Pulse Rate 87     Resp 18     Temp 98.9 F (37.2 C)     Temp Source Oral     SpO2 99 %     Weight 210 lb (95.3 kg)     Height  (1.727 m)     Head Circumference      Peak Flow      Pain Score 6     Pain Loc      Pain Edu?      Excl. in GC?     Constitutional: Alert and oriented. Well appearing and in no acute distress. Eyes: Conjunctivae are normal.  Head: Atraumatic. Nose: No congestion/rhinnorhea. Mouth/Throat: Mucous membranes are moist. No pharyngeal erythema. No swelling to the tonsils with uvula. Neck: No stridor.   Cardiovascular: Normal rate, regular rhythm. Grossly normal heart sounds.   Respiratory: Normal respiratory effort.  No retractions. Lungs CTAB. Gastrointestinal: Soft and nontender. No distention.  Musculoskeletal: No lower extremity tenderness nor edema.  No joint effusions. Neurologic:  Normal speech and language. No gross focal neurologic deficits are appreciated. Skin:  Skin is warm, dry and intact. No rash noted. Psychiatric: Mood and affect are normal. Speech and behavior are normal.  ____________________________________________   LABS (all labs ordered are listed, but only abnormal results are  displayed)  Labs Reviewed  COMPREHENSIVE METABOLIC PANEL - Abnormal; Notable for the following:       Result Value   Sodium 146 (*)    Potassium 2.9 (*)    Glucose, Bld 113 (*)    Total Protein 8.6 (*)    All other components within normal limits  ETHANOL - Abnormal; Notable for the following:    Alcohol, Ethyl (B) 205 (*)    All other components within normal limits  CBC  URINE DRUG SCREEN, QUALITATIVE ([facility] ONLY)  PREGNANCY, URINE  URINALYSIS, COMPLETE (UACMP) WITH MICROSCOPIC  ACETAMINOPHEN LEVEL  SALICYLATE LEVEL   ____________________________________________  EKG   ____________________________________________  RADIOLOGY   ____________________________________________   PROCEDURES  Procedure(s) performed:   Procedures  Critical Care performed:   ____________________________________________   INITIAL IMPRESSION / ASSESSMENT AND PLAN / ED COURSE  Pertinent labs & imaging results that were available during my care of the patient were reviewed by me and considered in my medical decision making (see chart for details).  DDX:  Anxiety, n/v, viral syndrome  ----------------------------------------- 7:53 PM on 05/02/2017 -----------------------------------------  Awaiting psychiatric consultation  at this time. Patient able to drink water. Signed out to Dr. Sharma Covert.      ____________________________________________   FINAL CLINICAL IMPRESSION(S) / ED DIAGNOSES  Viral syndrome. Alcohol intoxication. Anxiety.    NEW MEDICATIONS STARTED DURING THIS VISIT:  New Prescriptions   No medications on file     Note:  This document was prepared using Dragon voice recognition software and may include unintentional dictation errors.     Myrna Blazer, MD 05/02/17 [phone]

## 2017-05-02 NOTE — Discharge Instructions (Signed)
Please make the following drug adjustments:  1.) Decrease Seroquel to 12.5mg  three times daily 2.) Decrease Effexor to  once daily 3.) STOP Mirtazapine 4.) Continue Gabapentin as prescribed.  Return to the emergency department if you develop any fever, changes in her speech, pain, or any other symptoms concerning to you.

## 2017-05-02 NOTE — ED Notes (Signed)
Report off to rachel rn  

## 2017-05-02 NOTE — ED Notes (Signed)
Pt reports decreased appetite, feeling anxious. Pt denies SI or HI.  Denies drug use or etoh use.  Pt calm and cooperative.  Sx for several days.  Pt in hallway bed.

## 2017-08-08 ENCOUNTER — Encounter: Payer: Self-pay | Admitting: Certified Nurse Midwife

## 2017-08-15 ENCOUNTER — Encounter: Payer: Self-pay | Admitting: Certified Nurse Midwife

## 2017-08-29 ENCOUNTER — Encounter: Payer: Self-pay | Admitting: Certified Nurse Midwife

## 2017-11-30 ENCOUNTER — Emergency Department: Payer: Medicaid Other

## 2017-11-30 ENCOUNTER — Other Ambulatory Visit: Payer: Self-pay

## 2017-11-30 ENCOUNTER — Emergency Department
Admission: EM | Admit: 2017-11-30 | Discharge: 2017-11-30 | Disposition: A | Discharge status: Home / Self Care | Payer: Medicaid Other | Attending: Emergency Medicine | Admitting: Emergency Medicine

## 2017-11-30 DIAGNOSIS — F17210 Nicotine dependence, cigarettes, uncomplicated: Secondary | ICD-10-CM | POA: Insufficient documentation

## 2017-11-30 DIAGNOSIS — R319 Hematuria, unspecified: Secondary | ICD-10-CM | POA: Insufficient documentation

## 2017-11-30 DIAGNOSIS — M546 Pain in thoracic spine: Secondary | ICD-10-CM | POA: Diagnosis present

## 2017-11-30 DIAGNOSIS — Z79899 Other long term (current) drug therapy: Secondary | ICD-10-CM | POA: Diagnosis not present

## 2017-11-30 DIAGNOSIS — R109 Unspecified abdominal pain: Secondary | ICD-10-CM | POA: Insufficient documentation

## 2017-11-30 DIAGNOSIS — Z8673 Personal history of transient ischemic attack (TIA), and cerebral infarction without residual deficits: Secondary | ICD-10-CM | POA: Insufficient documentation

## 2017-11-30 LAB — URINALYSIS, COMPLETE (UACMP) WITH MICROSCOPIC
BACTERIA UA: NONE SEEN
Bilirubin Urine: NEGATIVE
GLUCOSE, UA: NEGATIVE mg/dL
Hgb urine dipstick: NEGATIVE
Ketones, ur: NEGATIVE mg/dL
LEUKOCYTES UA: NEGATIVE
Nitrite: NEGATIVE
PH: 5 (ref 5.0–8.0)
Protein, ur: NEGATIVE mg/dL
SPECIFIC GRAVITY, URINE: 1.018 (ref 1.005–1.030)

## 2017-11-30 MED ORDER — CYCLOBENZAPRINE HCL 10 MG PO TABS: 10.0000 mg | ORAL_TABLET | Freq: Three times a day (TID) | ORAL | 0 refills | Status: DC | PRN

## 2017-11-30 MED ORDER — CYCLOBENZAPRINE HCL 10 MG PO TABS
10.0000 mg | ORAL_TABLET | Freq: Once | ORAL | Status: AC
  Administered 2017-11-30: 10 mg via ORAL
  Filled 2017-11-30: qty 1

## 2017-11-30 MED ORDER — NAPROXEN 500 MG PO TABS
500.0000 mg | ORAL_TABLET | Freq: Once | ORAL | Status: AC
  Administered 2017-11-30: 500 mg via ORAL
  Filled 2017-11-30: qty 1

## 2017-11-30 MED ORDER — NAPROXEN 500 MG PO TABS: 500.0000 mg | ORAL_TABLET | Freq: Two times a day (BID) | ORAL | 0 refills | Status: DC

## 2017-11-30 NOTE — Discharge Instructions (Signed)
Follow up with primary care for symptoms that are not improving over the week. ° °Return to the ER for symptoms that change or worsen if unable to schedule an appointment. °

## 2017-11-30 NOTE — ED Notes (Signed)
Pt presents ambulatory with steady gait; reports mid back pain x 1 week; denies injury; denies urinary s/s; pain is worse with movement;

## 2017-11-30 NOTE — ED Provider Notes (Signed)
[facility] Emergency Department Provider Note ____________________________________________  Time seen: Approximately 8:37 PM  I have reviewed the triage vital signs and the nursing notes.   HISTORY  Chief Complaint Back Pain    HPI Sandy Mason is a 42 y.o. female who presents to the emergency department for evaluation and treatment of mid back pain for the past week.  No specific injury.  Pain is worse with movement.  No dysuria.  Past Medical History:  Diagnosis Date  . Alcohol use disorder severe. 12/18/2014  . Anxiety   . Borderline personality disorder (HCC) 12/18/2014  . CVA (cerebral infarction)   . Depression   . 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 Opioid use disorder, severe, dependence (HCC) 12/20/2014  . PTSD (post-traumatic stress disorder) 03/28/2015  . 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 Seizures (HCC)   . Suicide attempt by hanging [facility])     Patient Active Problem List   Diagnosis Date Noted  . Major depressive disorder, recurrent severe without psychotic features (HCC) 04/11/2017  . Salicylate overdose 04/09/2017  . AKI (acute kidney injury) (HCC) 04/03/2016  . Suicidal behavior 01/16/2016  . Alcohol intoxication (HCC)   . Severe episode of recurrent major depressive disorder, without psychotic features (HCC)   . Malingering 12/16/2015  . Major depressive disorder, recurrent episode, moderate (HCC) 12/12/2015  . Closed pelvic fracture (HCC) 10/31/2015  . Tobacco abuse 10/31/2015  . Tobacco use disorder 03/28/2015  . PTSD (post-traumatic stress disorder) 03/28/2015  . Stimulant use disorder (cocaine) 03/28/2015  . 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 Opioid use disorder, severe, dependence (HCC) 12/20/2014  . Alcohol use disorder severe. 12/18/2014  . Borderline personality disorder  (HCC) 12/18/2014    No past surgical history on file.  Prior to Admission medications   Medication Sig Start Date End Date Taking? Authorizing Provider  albuterol (PROVENTIL HFA;VENTOLIN HFA) 108 (90 Base) MCG/ACT inhaler Inhale 2 puffs into the lungs every 6 (six) hours as needed for wheezing or shortness of breath. 03/19/17   Willy Eddy, MD  cyclobenzaprine (FLEXERIL) 10 MG tablet Take 1 tablet (10 mg total) by mouth 3 (three) times daily as needed for muscle spasms. 11/30/17   Sumeya Yontz B, FNP  famotidine (PEPCID) 20 MG tablet Take 20 mg by mouth 2 (two) times daily.    [provider]  gabapentin (NEURONTIN) 300 MG capsule Take 3 capsules (900 mg total) by mouth 3 (three) times daily. 04/16/17   Pucilowska, Braulio Conte B, MD  naproxen (NAPROSYN) 500 MG tablet Take 1 tablet (500 mg total) by mouth 2 (two) times daily with a meal. 11/30/17   Capri Veals B, FNP  prazosin (MINIPRESS) 2 MG capsule Take 1 capsule (2 mg total) by mouth at bedtime. 04/15/17   Pucilowska, Braulio Conte B, MD  QUEtiapine (SEROQUEL) 25 MG tablet Take 0.5 tablets (12.5 mg total) by mouth 4 (four) times daily -  with meals and at bedtime. 05/02/17   Rockne Menghini, MD  venlafaxine XR (EFFEXOR-XR) 150 MG 24 hr capsule Take 1 capsule (150 mg total) by mouth daily with breakfast. 05/02/17   Rockne Menghini, MD    Allergies Depakote [valproic acid]; Haldol [haloperidol lactate]; and Keflet [cephalexin]  Family History  Problem Relation Age of Onset  . Diabetes Father     Social History Social History   Tobacco Use  . Smoking status: Current Every Day Smoker  Packs/day: 1.00    Types: Cigarettes  . Smokeless tobacco: Never Used  Substance Use Topics  . Alcohol use: Yes  . Drug use: No    Types: Benzodiazepines, Hydrocodone    Review of Systems Constitutional: Negative for fever. Cardiovascular: Negative for chest pain. Respiratory: Negative for shortness of breath. Musculoskeletal:  Positive for bilateral mid back pain. Skin: Negative for rash, lesion, or wound. Neurological: Negative for decrease in sensation  ____________________________________________   PHYSICAL EXAM:  VITAL SIGNS: ED Triage Vitals  Enc Vitals Group     BP 11/30/17 2029 (!) 162/86     Pulse Rate 11/30/17 2029 95     Resp 11/30/17 2029 18     Temp 11/30/17 2029 98.4 F (36.9 C)     Temp Source 11/30/17 2029 Oral     SpO2 11/30/17 2029 99 %     Weight 11/30/17 2029 240 lb (108.9 kg)     Height 11/30/17 2029  (1.727 m)     Head Circumference --      Peak Flow --      Pain Score 11/30/17 2028 10     Pain Loc --      Pain Edu? --      Excl. in GC? --     Constitutional: Alert and oriented. Well appearing and in no acute distress. Eyes: Conjunctivae are clear without discharge or drainage Head: Atraumatic Neck: Supple Respiratory: No cough. Respirations are even and unlabored. Musculoskeletal: Parathoracic tenderness on exam.  CVA tenderness greater on the right. Neurologic: Awake, alert, oriented x4. Skin: No open wounds or lesions on exposed skin surfaces. Psychiatric: Affect and behavior are appropriate.  ____________________________________________   LABS (all labs ordered are listed, but only abnormal results are displayed)  Labs Reviewed  URINALYSIS, COMPLETE (UACMP) WITH MICROSCOPIC - Abnormal; Notable for the following components:      Result Value   Color, Urine YELLOW (*)    APPearance CLEAR (*)    All other components within normal limits   ____________________________________________  RADIOLOGY  CT for renal stone negative per radiology. ____________________________________________   PROCEDURES  Procedures  ____________________________________________   INITIAL IMPRESSION / ASSESSMENT AND PLAN / ED COURSE  Sandy Mason is a 42 y.o. who presents to the emergency department for treatment and evaluation of mid back pain.  Patient states that  she feels like this is got to be related to her kidneys and reports an increase in frequency of urination but denies dysuria and denies hematuria.  She has no history of kidney stone.  Symptoms started suddenly 1 week ago and have been intermittent.  This evening, the pain increased significantly and patient states that it is severe.  She has been taking Tylenol without much relief.  While in the ER tonight, as her urinalysis was reassuring as was her CT to rule out renal stone.  She will be treated with Flexeril and Naprosyn for musculoskeletal source of pain.  She was advised to follow-up with primary care provider for choice for symptoms that are not improving over the next few days.  She was advised to return to the emergency department for symptoms of change or worsen if she is unable to schedule an appointment.  Medications  cyclobenzaprine (FLEXERIL) tablet 10 mg (10 mg Oral Given 11/30/17 2211)  naproxen (NAPROSYN) tablet 500 mg (500 mg Oral Given 11/30/17 2211)    Pertinent labs & imaging results that were available during my care of the patient were reviewed by me  and considered in my medical decision making (see chart for details).  _________________________________________   FINAL CLINICAL IMPRESSION(S) / ED DIAGNOSES  Final diagnoses:  Acute bilateral thoracic back pain    ED Discharge Orders        Ordered    cyclobenzaprine (FLEXERIL) 10 MG tablet  3 times daily PRN     11/30/17 2203    naproxen (NAPROSYN) 500 MG tablet  2 times daily with meals     11/30/17 2203       If controlled substance prescribed during this visit, 12 month history viewed on the [facility] prior to issuing an initial prescription for Schedule II or III opiod.    Chinita Pester, FNP 11/30/17 2253    Sharyn Creamer, MD 12/01/17 [phone]

## 2017-11-30 NOTE — ED Triage Notes (Signed)
Patient reports mid back pain for the past week.  Denies any type of injury.

## 2018-02-19 ENCOUNTER — Encounter: Payer: Self-pay | Admitting: Emergency Medicine

## 2018-02-19 ENCOUNTER — Other Ambulatory Visit: Payer: Self-pay

## 2018-02-19 ENCOUNTER — Emergency Department
Admission: EM | Admit: 2018-02-19 | Discharge: 2018-02-20 | Disposition: A | Discharge status: Home / Self Care | Payer: Medicaid Other | Attending: Emergency Medicine | Admitting: Emergency Medicine

## 2018-02-19 DIAGNOSIS — F17210 Nicotine dependence, cigarettes, uncomplicated: Secondary | ICD-10-CM | POA: Insufficient documentation

## 2018-02-19 DIAGNOSIS — G2401 Drug induced subacute dyskinesia: Secondary | ICD-10-CM

## 2018-02-19 DIAGNOSIS — Z79899 Other long term (current) drug therapy: Secondary | ICD-10-CM | POA: Diagnosis not present

## 2018-02-19 DIAGNOSIS — R6884 Jaw pain: Secondary | ICD-10-CM | POA: Diagnosis present

## 2018-02-19 NOTE — ED Triage Notes (Addendum)
Pt to triage via w/c by EMS from home; pt appears anxious; Pt reports yesterday took loratadine for sinus congestion since last wk and began making her "jaw lock"; drank couple shots to calm down but felt her jaw locking again

## 2018-02-19 NOTE — ED Notes (Signed)
Pt eating and drinking food in waiting room without difficulty.

## 2018-02-20 ENCOUNTER — Other Ambulatory Visit: Payer: Self-pay

## 2018-02-20 LAB — COMPREHENSIVE METABOLIC PANEL
ALBUMIN: 3.7 g/dL (ref 3.5–5.0)
ALK PHOS: 77 U/L (ref 38–126)
ALT: 17 U/L (ref 0–44)
AST: 20 U/L (ref 15–41)
Anion gap: 7 (ref 5–15)
BUN: 7 mg/dL (ref 6–20)
CALCIUM: 9 mg/dL (ref 8.9–10.3)
CO2: 26 mmol/L (ref 22–32)
CREATININE: 0.56 mg/dL (ref 0.44–1.00)
Chloride: 110 mmol/L (ref 98–111)
GFR calc Af Amer: >60 mL/min (ref >60)
GFR calc non Af Amer: >60 mL/min (ref >60)
GLUCOSE: 126 mg/dL — AB (ref 70–99)
Potassium: 3.5 mmol/L (ref 3.5–5.1)
SODIUM: 143 mmol/L (ref 135–145)
Total Bilirubin: 0.3 mg/dL (ref 0.3–1.2)
Total Protein: 7.1 g/dL (ref 6.5–8.1)

## 2018-02-20 MED ORDER — BENZTROPINE MESYLATE 1 MG PO TABS: 1.0000 mg | ORAL_TABLET | Freq: Every day | ORAL | 2 refills | Status: DC

## 2018-02-20 MED ORDER — DIPHENHYDRAMINE HCL 50 MG/ML IJ SOLN
50.0000 mg | Freq: Once | INTRAMUSCULAR | Status: AC
  Administered 2018-02-20: 50 mg via INTRAVENOUS
  Filled 2018-02-20: qty 1

## 2018-02-20 NOTE — ED Provider Notes (Signed)
[facility] Emergency Department Provider Note ____________________   First MD Initiated Contact with Patient 02/20/18 0000     (approximate)  I have reviewed the triage vital signs and the nursing notes.   HISTORY  Chief Complaint Jaw Pain   HPI Sandy Mason is a 42 y.o. female below list of chronic medical conditions presents emergency department with slight "my jaw keeps locking since yesterday.  Patient states that she has had continued jaw pain which began yesterday.  Patient states that this made her very anxious and gave her a panic attack and as such she took her Seroquel last night which made symptoms resolved but recurred this morning.  Patient does admit to EtOH ingestion tonight's (3 shots).  Past Medical History:  Diagnosis Date  . Alcohol use disorder severe. 12/18/2014  . Anxiety   . Borderline personality disorder (HCC) 12/18/2014  . CVA (cerebral infarction)   . Depression   . 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 Opioid use disorder, severe, dependence (HCC) 12/20/2014  . PTSD (post-traumatic stress disorder) 03/28/2015  . 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 Seizures (HCC)   . Suicide attempt by hanging [facility](HCC)     Patient Active Problem List   Diagnosis Date Noted  . Major depressive disorder, recurrent severe without psychotic features (HCC) 04/11/2017  . Salicylate overdose 04/09/2017  . AKI (acute kidney injury) (HCC) 04/03/2016  . Suicidal behavior 01/16/2016  . Alcohol intoxication (HCC)   . Severe episode of recurrent major depressive disorder, without psychotic features (HCC)   . Malingering 12/16/2015  . Major depressive disorder, recurrent episode, moderate (HCC) 12/12/2015  . Closed pelvic fracture (HCC) 10/31/2015  . Tobacco abuse 10/31/2015  . Tobacco use disorder 03/28/2015  . PTSD (post-traumatic stress disorder)  03/28/2015  . Stimulant use disorder (cocaine) 03/28/2015  . 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 Opioid use disorder, severe, dependence (HCC) 12/20/2014  . Alcohol use disorder severe. 12/18/2014  . Borderline personality disorder (HCC) 12/18/2014    History reviewed. No pertinent surgical history.  Prior to Admission medications   Medication Sig Start Date End Date Taking? Authorizing Provider  albuterol (PROVENTIL HFA;VENTOLIN HFA) 108 (90 Base) MCG/ACT inhaler Inhale 2 puffs into the lungs every 6 (six) hours as needed for wheezing or shortness of breath. 03/19/17   Willy Eddyobinson, Patrick, MD  cyclobenzaprine (FLEXERIL) 10 MG tablet Take 1 tablet (10 mg total) by mouth 3 (three) times daily as needed for muscle spasms. 11/30/17   Triplett, Cari B, FNP  famotidine (PEPCID) 20 MG tablet Take 20 mg by mouth 2 (two) times daily.    [provider]  gabapentin (NEURONTIN) 300 MG capsule Take 3 capsules (900 mg total) by mouth 3 (three) times daily. 04/16/17   Pucilowska, Braulio ConteJolanta B, MD  naproxen (NAPROSYN) 500 MG tablet Take 1 tablet (500 mg total) by mouth 2 (two) times daily with a meal. 11/30/17   Triplett, Cari B, FNP  prazosin (MINIPRESS) 2 MG capsule Take 1 capsule (2 mg total) by mouth at bedtime. 04/15/17   Pucilowska, Braulio ConteJolanta B, MD  QUEtiapine (SEROQUEL) 25 MG tablet Take 0.5 tablets (12.5 mg total) by mouth 4 (four) times daily -  with meals and at bedtime. 05/02/17   Rockne MenghiniNorman, Anne-Caroline, MD  venlafaxine XR (EFFEXOR-XR) 150 MG 24 hr capsule Take 1 capsule (150 mg total) by mouth daily with breakfast. 05/02/17   Sharma CovertNorman,  Anne-Caroline, MD    Allergies Depakote [valproic acid]; Haldol [haloperidol lactate]; and Keflet [cephalexin]  Family History  Problem Relation Age of Onset  . Diabetes Father     Social History Social History   Tobacco Use  . Smoking status: Current Every Day Smoker    Packs/day: 1.00    Types: Cigarettes  .  Smokeless tobacco: Never Used  Substance Use Topics  . Alcohol use: Yes  . Drug use: No    Types: Benzodiazepines, Hydrocodone    Review of Systems Constitutional: No fever/chills Eyes: No visual changes. ENT: No sore throat.  Positive for jaw pain Cardiovascular: Denies chest pain. Respiratory: Denies shortness of breath. Gastrointestinal: No abdominal pain.  No nausea, no vomiting.  No diarrhea.  No constipation. Genitourinary: Negative for dysuria. Musculoskeletal: Negative for neck pain.  Negative for back pain. Integumentary: Negative for rash. Neurological: Negative for headaches, focal weakness or numbness. Psychiatric:Positive for anxiety   ____________________________________________   PHYSICAL EXAM:  VITAL SIGNS: ED Triage Vitals  Enc Vitals Group     BP 02/19/18 2312 (!) 159/92     Pulse Rate 02/19/18 2312 (!) 110     Resp 02/19/18 2312 20     Temp 02/19/18 2312 98 F (36.7 C)     Temp Source 02/19/18 2312 Oral     SpO2 02/19/18 2312 97 %     Weight 02/19/18 2311 90.7 kg (200 lb)     Height 02/19/18 2311 1.727 m (5\' 8" )     Head Circumference --      Peak Flow --      Pain Score 02/19/18 2311 10     Pain Loc --      Pain Edu? --      Excl. in GC? --     Constitutional: Alert and oriented. Well appearing and in no acute distress. Eyes: Conjunctivae are normal.  Head: Atraumatic. Ears:  Healthy appearing ear canals and TMs bilaterally Mouth/Throat: Mucous membranes are moist.  Oropharynx non-erythematous.  Pain with palpation of the bilateral TMJ joints.  Repetitive chewing motions of the mouth Neck: No stridor.  Cardiovascular: Normal rate, regular rhythm. Good peripheral circulation. Grossly normal heart sounds. Respiratory: Normal respiratory effort.  No retractions. Lungs CTAB. Gastrointestinal: Soft and nontender. No distention.  Musculoskeletal: No lower extremity tenderness nor edema. No gross deformities of extremities.  Pain to palpation of  the muscles of mastication Neurologic:  Normal speech and language. No gross focal neurologic deficits are appreciated.  Skin:  Skin is warm, dry and intact. No rash noted. Psychiatric: Anxious affect.  Speech and behavior are normal.  ____________________________________________   LABS (all labs ordered are listed, but only abnormal results are displayed)  Labs Reviewed  COMPREHENSIVE METABOLIC PANEL   ______________________________________  Procedures   ____________________________________________   INITIAL IMPRESSION / ASSESSMENT AND PLAN / ED COURSE  As part of my medical decision making, I reviewed the following data within the electronic MEDICAL RECORD NUMBER   42 year old female presented with above-stated history and physical exam secondary to jaw pain.  Patient with persistent involuntary movements of the mouth reevaluation concerning for tardive dyskinesia however also consider the possibly of hypocalcemia or hypokalemia and as such CMP was performed which was normal.  Patient given Benadryl in the emergency department with resolution of symptoms.  Will prescribe Cogentin for home ____________________________________________  FINAL CLINICAL IMPRESSION(S) / ED DIAGNOSES  Final diagnoses:  Tardive dyskinesia     MEDICATIONS GIVEN DURING THIS VISIT:  Medications  diphenhydrAMINE (  BENADRYL) injection 50 mg (has no administration in time range)     ED Discharge Orders    None       Note:  This document was prepared using Dragon voice recognition software and may include unintentional dictation errors.    Darci Current, MD 02/20/18 [phone]

## 2018-04-29 ENCOUNTER — Emergency Department
Admission: EM | Admit: 2018-04-29 | Discharge: 2018-04-29 | Disposition: A | Discharge status: Home / Self Care | Payer: Medicaid Other | Attending: Emergency Medicine | Admitting: Emergency Medicine

## 2018-04-29 ENCOUNTER — Other Ambulatory Visit: Payer: Self-pay

## 2018-04-29 DIAGNOSIS — Z760 Encounter for issue of repeat prescription: Secondary | ICD-10-CM | POA: Diagnosis not present

## 2018-04-29 DIAGNOSIS — Z8673 Personal history of transient ischemic attack (TIA), and cerebral infarction without residual deficits: Secondary | ICD-10-CM

## 2018-04-29 DIAGNOSIS — F19239 Other psychoactive substance dependence with withdrawal, unspecified: Secondary | ICD-10-CM | POA: Insufficient documentation

## 2018-04-29 DIAGNOSIS — K0381 Cracked tooth: Secondary | ICD-10-CM | POA: Insufficient documentation

## 2018-04-29 DIAGNOSIS — Z79899 Other long term (current) drug therapy: Secondary | ICD-10-CM | POA: Insufficient documentation

## 2018-04-29 DIAGNOSIS — F419 Anxiety disorder, unspecified: Secondary | ICD-10-CM | POA: Insufficient documentation

## 2018-04-29 DIAGNOSIS — F17210 Nicotine dependence, cigarettes, uncomplicated: Secondary | ICD-10-CM

## 2018-04-29 DIAGNOSIS — F332 Major depressive disorder, recurrent severe without psychotic features: Secondary | ICD-10-CM

## 2018-04-29 MED ORDER — PRAZOSIN HCL 1 MG PO CAPS
2.0000 mg | ORAL_CAPSULE | ORAL | Status: AC
  Administered 2018-04-29: 2 mg via ORAL
  Filled 2018-04-29: qty 2

## 2018-04-29 MED ORDER — QUETIAPINE FUMARATE 25 MG PO TABS
25.0000 mg | ORAL_TABLET | ORAL | Status: AC
  Administered 2018-04-29: 25 mg via ORAL
  Filled 2018-04-29: qty 1

## 2018-04-29 MED ORDER — LORAZEPAM 2 MG PO TABS
2.0000 mg | ORAL_TABLET | Freq: Once | ORAL | Status: AC
  Administered 2018-04-29: 2 mg via ORAL
  Filled 2018-04-29: qty 1

## 2018-04-29 NOTE — ED Triage Notes (Signed)
Pt arrives to ED via POV with c/o withdrawal from her psych medications x3 days. Pt reports several r/x'd medications "that got missing" and asking for refills. Pt denies SI or HI.
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
## 2018-04-29 NOTE — Discharge Instructions (Addendum)
As we discussed, we gave you some medications tonight to help with your anxiety symptoms, but your refills will need to come from [facility].  Please go to the clinic today.  See below for some contact information and walk-in hours:  [facility] [address] [place], [place] [address] Phone:  [phone] or [phone]  Open Access:   Walk-in ASSESSMENT hours, M-W-F, 8:00am - 3:00pm Advanced Acess CRISIS:  M-F, 8:00am - 8:00pm Outpatient Services Office Hours:  M-F, 8:00am - 5:00pm

## 2018-04-30 ENCOUNTER — Emergency Department
Admission: EM | Admit: 2018-04-30 | Discharge: 2018-04-30 | Disposition: A | Discharge status: Home / Self Care | Payer: Medicaid Other | Source: Home / Self Care | Attending: Emergency Medicine | Admitting: Emergency Medicine

## 2018-04-30 DIAGNOSIS — R6889 Other general symptoms and signs: Secondary | ICD-10-CM

## 2018-04-30 MED ORDER — LORAZEPAM 2 MG PO TABS
2.0000 mg | ORAL_TABLET | Freq: Once | ORAL | Status: AC
Start: 2018-04-30 — End: 2018-04-30
  Administered 2018-04-30: 2 mg via ORAL
  Filled 2018-04-30: qty 1

## 2018-04-30 MED ORDER — CLONIDINE HCL 0.1 MG/24HR TD PTWK
0.1000 mg | MEDICATED_PATCH | TRANSDERMAL | Status: DC
  Administered 2018-04-30: 0.1 mg via TRANSDERMAL
  Filled 2018-04-30: qty 1
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
## 2018-04-30 NOTE — ED Notes (Signed)
Continue to await for catapress patch.

## 2018-04-30 NOTE — Discharge Instructions (Addendum)
1.  Please get in touch with your doctor to get your medicines today.   2.  Return to the ER for worsening symptoms, persistent vomiting, difficulty breathing or other concerns.

## 2018-05-19 ENCOUNTER — Encounter: Payer: Self-pay | Admitting: Certified Nurse Midwife

## 2018-05-19 ENCOUNTER — Ambulatory Visit (INDEPENDENT_AMBULATORY_CARE_PROVIDER_SITE_OTHER): Payer: Medicaid Other | Admitting: Certified Nurse Midwife

## 2018-05-19 VITALS — BP 118/85 | HR 71 | Ht 68.0 in | Wt 201.1 lb

## 2018-05-19 DIAGNOSIS — Z30433 Encounter for removal and reinsertion of intrauterine contraceptive device: Secondary | ICD-10-CM | POA: Diagnosis not present

## 2018-05-19 DIAGNOSIS — Z975 Presence of (intrauterine) contraceptive device: Secondary | ICD-10-CM

## 2018-05-19 NOTE — Progress Notes (Signed)
Sandy Mason is a 42 y.o. year old [id number] Hispanic female who presents for removal and replacement of a Mirena IUD. She was given informed consent for removal and reinsertion of her Mirena. Her Mirena was placed in 2013 at [facility]-per patient report, No LMP recorded. (Menstrual status: IUD)., and her pregnancy test today was negative.   The risks and benefits of the method and placement have been thouroughly reviewed with the patient and all questions were answered.  Specifically the patient is aware of failure rate of 07/998, expulsion of the IUD and of possible perforation.  The patient is aware of irregular bleeding due to the method and understands the incidence of irregular bleeding diminishes with time.  Signed copy of informed consent in chart.   BP 118/85   Pulse 71   Ht 5\' 8"  (1.727 m)   Wt 201 lb 1.6 oz (91.2 kg)   BMI 30.58 kg/m    Appropriate time out taken. A medium plastic speculum was placed in the vagina.  The cervix was visualized, prepped using Betadine. The strings were not visible. The IUD was grasped and the Mirena was easily removed. The cervix was then grasped with a single-tooth tenaculum. The uterus was sounded to 8 cm.  Mirena IUD placed per manufacturer's recommendations without complications. The strings were trimmed to 3 cm.  The patient tolerated the procedure well.   The patient was given post procedure instructions, including signs and symptoms of infection and to check for the strings after each menses or each month, and refraining from intercourse or anything in the vagina for 3 days.  She was given a Mirena care card with date Mirena placed, and date Mirena to be removed.  Reviewed red flag symptoms and when to call.   RTC x 4-6 weeks for IUD string check or sooner if needed.    Gunnar Bulla, CNM [facility], [facility] 05/19/18 10:07 AM  NDC: 40981-191-47 Lot: [id number] Exp: 07/2020

## 2018-05-19 NOTE — Patient Instructions (Signed)
IUD PLACEMENT POST-PROCEDURE INSTRUCTIONS  1. You may take Ibuprofen, Aleve or Tylenol for pain if needed.  Cramping should resolve within in 24 hours.  2. You may have a small amount of spotting.  You should wear a mini pad for the next few days.  3. You may have intercourse after 72 hours.  If you using this for birth control, it is effective immediately.  4. You need to call if you have any pelvic pain, fever, heavy bleeding or foul smelling vaginal discharge.  Irregular bleeding is common the first several months after having an IUD placed. You do not need to call for this reason unless you are concerned.  5. Shower or bathe as normal  You should have a follow-up appointment in 4-8 weeks for a re-check to make sure you are not having any problems.Levonorgestrel intrauterine device (IUD) What is this medicine? LEVONORGESTREL IUD (LEE voe nor jes trel) is a contraceptive (birth control) device. The device is placed inside the uterus by a healthcare professional. It is used to prevent pregnancy. This device can also be used to treat heavy bleeding that occurs during your period. This medicine may be used for other purposes; ask your health care provider or pharmacist if you have questions. COMMON BRAND NAME(S): Kyleena, LILETTA, Mirena, Skyla What should I tell my health care provider before I take this medicine? They need to know if you have any of these conditions: -abnormal Pap smear -cancer of the breast, uterus, or cervix -diabetes -endometritis -genital or pelvic infection now or in the past -have more than one sexual partner or your partner has more than one partner -heart disease -history of an ectopic or tubal pregnancy -immune system problems -IUD in place -liver disease or tumor -problems with blood clots or take blood-thinners -seizures -use intravenous drugs -uterus of unusual shape -vaginal bleeding that has not been explained -an unusual or allergic reaction to  levonorgestrel, other hormones, silicone, or polyethylene, medicines, foods, dyes, or preservatives -pregnant or trying to get pregnant -breast-feeding How should I use this medicine? This device is placed inside the uterus by a health care professional. Talk to your pediatrician regarding the use of this medicine in children. Special care may be needed. Overdosage: If you think you have taken too much of this medicine contact a poison control center or emergency room at once. NOTE: This medicine is only for you. Do not share this medicine with others. What if I miss a dose? This does not apply. Depending on the brand of device you have inserted, the device will need to be replaced every 3 to 5 years if you wish to continue using this type of birth control. What may interact with this medicine? Do not take this medicine with any of the following medications: -amprenavir -bosentan -fosamprenavir This medicine may also interact with the following medications: -aprepitant -armodafinil -barbiturate medicines for inducing sleep or treating seizures -bexarotene -boceprevir -griseofulvin -medicines to treat seizures like carbamazepine, ethotoin, felbamate, oxcarbazepine, phenytoin, topiramate -modafinil -pioglitazone -rifabutin -rifampin -rifapentine -some medicines to treat HIV infection like atazanavir, efavirenz, indinavir, lopinavir, nelfinavir, tipranavir, ritonavir -St. John's wort -warfarin This list may not describe all possible interactions. Give your health care provider a list of all the medicines, herbs, non-prescription drugs, or dietary supplements you use. Also tell them if you smoke, drink alcohol, or use illegal drugs. Some items may interact with your medicine. What should I watch for while using this medicine? Visit your doctor or health care professional for regular   check ups. See your doctor if you or your partner has sexual contact with others, becomes HIV positive, or  gets a sexual transmitted disease. This product does not protect you against HIV infection (AIDS) or other sexually transmitted diseases. You can check the placement of the IUD yourself by reaching up to the top of your vagina with clean fingers to feel the threads. Do not pull on the threads. It is a good habit to check placement after each menstrual period. Call your doctor right away if you feel more of the IUD than just the threads or if you cannot feel the threads at all. The IUD may come out by itself. You may become pregnant if the device comes out. If you notice that the IUD has come out use a backup birth control method like condoms and call your health care provider. Using tampons will not change the position of the IUD and are okay to use during your period. This IUD can be safely scanned with magnetic resonance imaging (MRI) only under specific conditions. Before you have an MRI, tell your healthcare provider that you have an IUD in place, and which type of IUD you have in place. What side effects may I notice from receiving this medicine? Side effects that you should report to your doctor or health care professional as soon as possible: -allergic reactions like skin rash, itching or hives, swelling of the face, lips, or tongue -fever, flu-like symptoms -genital sores -high blood pressure -no menstrual period for 6 weeks during use -pain, swelling, warmth in the leg -pelvic pain or tenderness -severe or sudden headache -signs of pregnancy -stomach cramping -sudden shortness of breath -trouble with balance, talking, or walking -unusual vaginal bleeding, discharge -yellowing of the eyes or skin Side effects that usually do not require medical attention (report to your doctor or health care professional if they continue or are bothersome): -acne -breast pain -change in sex drive or performance -changes in weight -cramping, dizziness, or faintness while the device is being  inserted -headache -irregular menstrual bleeding within first 3 to 6 months of use -nausea This list may not describe all possible side effects. Call your doctor for medical advice about side effects. You may report side effects to FDA at 1-800-FDA-1088. Where should I keep my medicine? This does not apply. NOTE: This sheet is a summary. It may not cover all possible information. If you have questions about this medicine, talk to your doctor, pharmacist, or health care provider.  2018 Elsevier/Gold Standard (2016-04-27 14:14:56)  

## 2018-06-23 ENCOUNTER — Encounter: Payer: Self-pay | Admitting: Certified Nurse Midwife

## 2018-10-23 ENCOUNTER — Ambulatory Visit
Admission: EM | Admit: 2018-10-23 | Discharge: 2018-10-23 | Disposition: A | Discharge status: Home / Self Care | Payer: Medicaid Other | Attending: Family Medicine | Admitting: Family Medicine

## 2018-10-23 ENCOUNTER — Other Ambulatory Visit: Payer: Self-pay

## 2018-10-23 ENCOUNTER — Encounter: Payer: Self-pay | Admitting: Emergency Medicine

## 2018-10-23 DIAGNOSIS — F411 Generalized anxiety disorder: Secondary | ICD-10-CM

## 2018-10-23 MED ORDER — BUSPIRONE HCL 10 MG PO TABS: 10.0000 mg | ORAL_TABLET | Freq: Three times a day (TID) | ORAL | 0 refills | Status: DC

## 2018-10-23 NOTE — Discharge Instructions (Signed)
Try the buspar.  Best of luck.  Dr. Adriana Simas
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
## 2018-10-23 NOTE — ED Triage Notes (Signed)
Patient in today c/o increased anxiety x 4 days ago. Patient states she has hydroxyzine and it is not helping. Patient states she has a psychiatrist but they won't give her anything else. Patient is scheduled to see someone at [facility] on 10/29/18. Patient states she can't wait that long.

## 2018-10-23 NOTE — ED Triage Notes (Signed)
Patient is requesting Lorazepam. She doesn't have any and hasn't had any in ~2 months.

## 2018-11-24 ENCOUNTER — Emergency Department: Payer: Medicaid Other

## 2018-11-24 ENCOUNTER — Emergency Department
Admission: EM | Admit: 2018-11-24 | Discharge: 2018-11-24 | Disposition: A | Discharge status: Home / Self Care | Payer: Medicaid Other | Attending: Emergency Medicine | Admitting: Emergency Medicine

## 2018-11-24 ENCOUNTER — Other Ambulatory Visit: Payer: Self-pay

## 2018-11-24 ENCOUNTER — Encounter: Payer: Self-pay | Admitting: Emergency Medicine

## 2018-11-24 DIAGNOSIS — F17210 Nicotine dependence, cigarettes, uncomplicated: Secondary | ICD-10-CM | POA: Insufficient documentation

## 2018-11-24 DIAGNOSIS — Z79899 Other long term (current) drug therapy: Secondary | ICD-10-CM | POA: Diagnosis not present

## 2018-11-24 DIAGNOSIS — Y939 Activity, unspecified: Secondary | ICD-10-CM | POA: Diagnosis not present

## 2018-11-24 DIAGNOSIS — W19XXXA Unspecified fall, initial encounter: Secondary | ICD-10-CM | POA: Insufficient documentation

## 2018-11-24 DIAGNOSIS — Y929 Unspecified place or not applicable: Secondary | ICD-10-CM | POA: Diagnosis not present

## 2018-11-24 DIAGNOSIS — M542 Cervicalgia: Secondary | ICD-10-CM | POA: Insufficient documentation

## 2018-11-24 DIAGNOSIS — Y999 Unspecified external cause status: Secondary | ICD-10-CM | POA: Diagnosis not present

## 2018-11-24 DIAGNOSIS — M25571 Pain in right ankle and joints of right foot: Secondary | ICD-10-CM | POA: Diagnosis not present

## 2018-11-24 NOTE — ED Triage Notes (Signed)
Patient ambulatory to triage with steady gait, without difficulty or distress noted; pt reports losing balance & falling 2 days ago injuring right ankle and neck; purplish bruising noted to right ankle

## 2018-11-24 NOTE — ED Provider Notes (Signed)
[facility] Emergency Department Provider Note  ____________________________________________  Time seen: Approximately 10:48 PM  I have reviewed the triage vital signs and the nursing notes.   HISTORY  Chief Complaint Fall    HPI Sandy Mason is a 43 y.o. female presents to the emergency department with acute right ankle pain and neck pain after a fall that occurred  2 days ago.  Patient denies loss of consciousness or headache.  She denies numbness or tingling in the lower extremities.  She denies similar injuries in the past.  No abrasions or lacerations.  No other alleviating measures have been attempted.        Past Medical History:  Diagnosis Date  . Alcohol use disorder severe. 12/18/2014  . Anxiety   . Borderline personality disorder (HCC) 12/18/2014  . CVA (cerebral infarction)   . Depression   . 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 Opioid use disorder, severe, dependence (HCC) 12/20/2014  . PTSD (post-traumatic stress disorder) 03/28/2015  . 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 Seizures (HCC)   . Suicide attempt by hanging [facility](HCC)     Patient Active Problem List   Diagnosis Date Noted  . Major depressive disorder, recurrent severe without psychotic features (HCC) 04/11/2017  . Salicylate overdose 04/09/2017  . AKI (acute kidney injury) (HCC) 04/03/2016  . Suicidal behavior 01/16/2016  . Alcohol intoxication (HCC)   . Severe episode of recurrent major depressive disorder, without psychotic features (HCC)   . Malingering 12/16/2015  . Major depressive disorder, recurrent episode, moderate (HCC) 12/12/2015  . Closed pelvic fracture (HCC) 10/31/2015  . Tobacco abuse 10/31/2015  . Tobacco use disorder 03/28/2015  . PTSD (post-traumatic stress disorder) 03/28/2015  . Stimulant use disorder (cocaine) 03/28/2015  . Sedative, hypnotic or anxiolytic  use disorder, severe, dependence (HCC) 03/27/2015  . Opioid use disorder, severe, dependence (HCC) 12/20/2014  . Alcohol use disorder severe. 12/18/2014  . Borderline personality disorder (HCC) 12/18/2014    Past Surgical History:  Procedure Laterality Date  . NO PAST SURGERIES      Prior to Admission medications   Medication Sig Start Date End Date Taking? Authorizing Provider  albuterol (PROVENTIL HFA;VENTOLIN HFA) 108 (90 Base) MCG/ACT inhaler Inhale 2 puffs into the lungs every 6 (six) hours as needed for wheezing or shortness of breath. 03/19/17   Willy Eddyobinson, Patrick, MD  busPIRone (BUSPAR) 10 MG tablet Take 1 tablet (10 mg total) by mouth 3 (three) times daily. 10/23/18   Tommie Samsook, Jayce G, DO  gabapentin (NEURONTIN) 300 MG capsule Take 3 capsules (900 mg total) by mouth 3 (three) times daily. 04/16/17   Pucilowska, Braulio ConteJolanta B, MD  hydrOXYzine (ATARAX/VISTARIL) 10 MG tablet Take 10 mg by mouth 3 (three) times daily as needed.    [provider]  levonorgestrel (MIRENA) 20 MCG/24HR IUD 1 each by Intrauterine route once.    [provider]  mirtazapine (REMERON) 30 MG tablet Take 30 mg by mouth at bedtime.    [provider]  prazosin (MINIPRESS) 2 MG capsule Take 1 capsule (2 mg total) by mouth at bedtime. 04/15/17   Pucilowska, Ellin GoodieJolanta B, MD  venlafaxine XR (EFFEXOR-XR) 150 MG 24 hr capsule Take 1 capsule (150 mg total) by mouth daily with breakfast. 05/02/17   Rockne MenghiniNorman, Anne-Caroline, MD    Allergies Depakote [valproic acid]; Haldol [haloperidol lactate]; Meloxicam; and Keflet [cephalexin]  Family History  Problem Relation Age of Onset  . Diabetes Father   .  Dementia Mother   . Anxiety disorder Mother     Social History Social History   Tobacco Use  . Smoking status: Current Every Day Smoker    Packs/day: 0.00    Years: 20.00    Pack years: 0.00    Types: Cigarettes  . Smokeless tobacco: Never Used  . Tobacco comment: 1-2 cigarettes per day  Substance  Use Topics  . Alcohol use: Yes    Comment: socially  . Drug use: No    Types: Benzodiazepines, Hydrocodone     Review of Systems  Constitutional: No fever/chills Eyes: No visual changes. No discharge ENT: No upper respiratory complaints. Cardiovascular: no chest pain. Respiratory: no cough. No SOB. Gastrointestinal: No abdominal pain.  No nausea, no vomiting.  No diarrhea.  No constipation. Genitourinary: Negative for dysuria. No hematuria Musculoskeletal: Patient has right ankle pain and neck pain.  Skin: Negative for rash, abrasions, lacerations, ecchymosis. Neurological: Negative for headaches, focal weakness or numbness.   ____________________________________________   PHYSICAL EXAM:  VITAL SIGNS: ED Triage Vitals  Enc Vitals Group     BP 11/24/18 2139 114/60     Pulse Rate 11/24/18 2139 84     Resp 11/24/18 2139 17     Temp 11/24/18 2139 98.2 F (36.8 C)     Temp Source 11/24/18 2139 Oral     SpO2 --      Weight 11/24/18 2131 219 lb (99.3 kg)     Height 11/24/18 2131 5\' 8"  (1.727 m)     Head Circumference --      Peak Flow --      Pain Score 11/24/18 2131 10     Pain Loc --      Pain Edu? --      Excl. in GC? --      Constitutional: Alert and oriented. Well appearing and in no acute distress. Eyes: Conjunctivae are normal. PERRL. EOMI. Head: Atraumatic. Neck: Patient is able to perform full range of motion of the neck.  No midline C-spine tenderness. Cardiovascular: Normal rate, regular rhythm. Normal S1 and S2.  Good peripheral circulation. Respiratory: Normal respiratory effort without tachypnea or retractions. Lungs CTAB. Good air entry to the bases with no decreased or absent breath sounds. Gastrointestinal: Bowel sounds 4 quadrants. Soft and nontender to palpation. No guarding or rigidity. No palpable masses. No distention. No CVA tenderness. Musculoskeletal: Patient is able to move all 5 right toes.  Patient is unable to perform full range of motion  at the right ankle, likely secondary to pain.  She has ecchymosis around her right ankle.  There is swelling over the lateral aspect of the right ankle in the proximity of the anterior talofibular ligament.  Palpable dorsalis pedis pulse, right. Neurologic:  Normal speech and language. No gross focal neurologic deficits are appreciated.  Skin:  Skin is warm, dry and intact. No rash noted. Psychiatric: Mood and affect are normal. Speech and behavior are normal. Patient exhibits appropriate insight and judgement.   ____________________________________________   LABS (all labs ordered are listed, but only abnormal results are displayed)  Labs Reviewed - No data to display ____________________________________________  EKG   ____________________________________________  RADIOLOGY I personally viewed and evaluated these images as part of my medical decision making, as well as reviewing the written report by the radiologist.  Dg Cervical Spine 2-3 Views  Result Date: 11/24/2018 CLINICAL DATA:  43 year old female status post fall 2 days ago with pain. EXAM: CERVICAL SPINE - 2-3 VIEW COMPARISON:  Cervical  spine CT 12/19/2013. FINDINGS: Reversal of cervical lordosis, straightening demonstrated in 2015. Normal prevertebral soft tissue contour. Normal AP alignment. Normal C1-C2 alignment. Cervicothoracic junction alignment not visible on the lateral view. Preserved disc spaces. No acute osseous abnormality identified. Negative visible lung apices. IMPRESSION: No acute fracture or listhesis identified in the cervical spine. Nonspecific reversal of lordosis, ligamentous injury is not excluded. Electronically Signed   By: Odessa Fleming M.D.   On: 11/24/2018 22:36   Dg Ankle Complete Right  Result Date: 11/24/2018 CLINICAL DATA:  43 year old female status post fall 2 days ago with pain swelling and bruising. EXAM: RIGHT ANKLE - COMPLETE 3+ VIEW COMPARISON:  None. FINDINGS: Generalized moderate to severe soft  tissue swelling about the ankle. Mortise joint alignment preserved. Talar dome intact. No definite joint effusion. Small corticated and chronic ossific fragment near the tip of the medial malleolus. No acute osseous abnormality identified. IMPRESSION: Soft tissue swelling with no acute fracture or dislocation identified about the right ankle. Small chronic appearing fracture fragment adjacent to the medial malleolus. Electronically Signed   By: Odessa Fleming M.D.   On: 11/24/2018 22:35    ____________________________________________    PROCEDURES  Procedure(s) performed:    Procedures    Medications - No data to display   ____________________________________________   INITIAL IMPRESSION / ASSESSMENT AND PLAN / ED COURSE  Pertinent labs & imaging results that were available during my care of the patient were reviewed by me and considered in my medical decision making (see chart for details).  Review of the [place] CSRS was performed in accordance of the [facility] prior to dispensing any controlled drugs.           Assessment and plan Fall Patient presents to the emergency department with right ankle pain and neck pain after a fall that occurred 2 days ago.  X-ray examination of the right foot and right ankle revealed no acute bony abnormality.  On physical exam, patient had ecchymosis around the right ankle with swelling.  Patient had difficulty staying awake during my assessment.  I do not feel comfortable giving patient potentially sedating medications at this time for pain.  Patient stated that she was allergic to meloxicam and Tylenol.  I advised Ace wrap application for compression and ice for inflammation.  She was advised to follow-up with podiatry regarding right ankle pain as needed.  All patient questions were answered.     ____________________________________________  FINAL CLINICAL IMPRESSION(S) / ED DIAGNOSES  Final diagnoses:  Fall, initial encounter      NEW  MEDICATIONS STARTED DURING THIS VISIT:  ED Discharge Orders    None          This chart was dictated using voice recognition software/Dragon. Despite best efforts to proofread, errors can occur which can change the meaning. Any change was purely unintentional.    Orvil Feil, PA-C 11/24/18 2255    Arnaldo Natal, MD 11/24/18 [phone]

## 2018-12-12 ENCOUNTER — Other Ambulatory Visit: Payer: Self-pay | Admitting: Family Medicine

## 2018-12-17 ENCOUNTER — Other Ambulatory Visit
Admission: RE | Admit: 2018-12-17 | Discharge: 2018-12-17 | Disposition: A | Discharge status: Home / Self Care | Payer: Medicaid Other | Source: Ambulatory Visit | Attending: Ophthalmology | Admitting: Ophthalmology

## 2018-12-17 DIAGNOSIS — H16001 Unspecified corneal ulcer, right eye: Secondary | ICD-10-CM | POA: Diagnosis present

## 2018-12-18 ENCOUNTER — Emergency Department (HOSPITAL_COMMUNITY)
Admission: EM | Admit: 2018-12-18 | Discharge: 2018-12-18 | Disposition: A | Discharge status: Home / Self Care | Payer: Medicaid Other | Attending: Emergency Medicine | Admitting: Emergency Medicine

## 2018-12-18 ENCOUNTER — Encounter (HOSPITAL_COMMUNITY): Payer: Self-pay | Admitting: Emergency Medicine

## 2018-12-18 ENCOUNTER — Other Ambulatory Visit: Payer: Self-pay

## 2018-12-18 DIAGNOSIS — F17210 Nicotine dependence, cigarettes, uncomplicated: Secondary | ICD-10-CM | POA: Insufficient documentation

## 2018-12-18 DIAGNOSIS — H16001 Unspecified corneal ulcer, right eye: Secondary | ICD-10-CM | POA: Diagnosis not present

## 2018-12-18 DIAGNOSIS — H5712 Ocular pain, left eye: Secondary | ICD-10-CM | POA: Diagnosis present

## 2018-12-18 DIAGNOSIS — Z8673 Personal history of transient ischemic attack (TIA), and cerebral infarction without residual deficits: Secondary | ICD-10-CM | POA: Diagnosis not present

## 2018-12-18 DIAGNOSIS — Z79899 Other long term (current) drug therapy: Secondary | ICD-10-CM | POA: Diagnosis not present

## 2018-12-18 MED ORDER — TRAMADOL HCL 50 MG PO TABS: 50.0000 mg | ORAL_TABLET | Freq: Four times a day (QID) | ORAL | 0 refills | Status: DC | PRN

## 2018-12-18 MED ORDER — ONDANSETRON 4 MG PO TBDP
4.0000 mg | ORAL_TABLET | Freq: Once | ORAL | Status: AC
  Administered 2018-12-18: 22:00:00 4 mg via ORAL
  Filled 2018-12-18: qty 1

## 2018-12-18 MED ORDER — OXYCODONE-ACETAMINOPHEN 5-325 MG PO TABS
1.0000 | ORAL_TABLET | Freq: Once | ORAL | Status: AC
  Administered 2018-12-18: 22:00:00 1 via ORAL
  Filled 2018-12-18: qty 1

## 2018-12-18 NOTE — ED Provider Notes (Signed)
[facility]ENCY [facility] Provider Note   CSN: [id number] Arrival date & time: 12/18/18  2046    History   Chief Complaint Chief Complaint  Patient presents with  . Eye Pain    HPI Sandy Mason is a 43 y.o. female who presents to the ED with cc of Left eye pain. She has a corneal ulceration. She is out of tramadol. She is [facility] a [facility]. She was sent here by her ophthalmologist because her pain was severe and she cannot sleep. She denies changes in her sxs. She has a follow-up appointment tomorrow with her ophthalmologist.     HPI  Past Medical History:  Diagnosis Date  . Alcohol use disorder severe. 12/18/2014  . Anxiety   . Borderline personality disorder (HCC) 12/18/2014  . CVA (cerebral infarction)   . Depression   . 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 Opioid use disorder, severe, dependence (HCC) 12/20/2014  . PTSD (post-traumatic stress disorder) 03/28/2015  . 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 Seizures (HCC)   . Suicide attempt by hanging [facility](HCC)     Patient Active Problem List   Diagnosis Date Noted  . Major depressive disorder, recurrent severe without psychotic features (HCC) 04/11/2017  . Salicylate overdose 04/09/2017  . AKI (acute kidney injury) (HCC) 04/03/2016  . Suicidal behavior 01/16/2016  . Alcohol intoxication (HCC)   . Severe episode of recurrent major depressive disorder, without psychotic features (HCC)   . Malingering 12/16/2015  . Major depressive disorder, recurrent episode, moderate (HCC) 12/12/2015  . Closed pelvic fracture (HCC) 10/31/2015  . Tobacco abuse 10/31/2015  . Tobacco use disorder 03/28/2015  . PTSD (post-traumatic stress disorder) 03/28/2015  . Stimulant use disorder (cocaine) 03/28/2015  . 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  Opioid use disorder, severe, dependence (HCC) 12/20/2014  . Alcohol use disorder severe. 12/18/2014  . Borderline personality disorder (HCC) 12/18/2014    Past Surgical History:  Procedure Laterality Date  . NO PAST SURGERIES       OB History    Gravida  2   Para  2   Term  2   Preterm      AB      Living        SAB      TAB      Ectopic      Multiple      Live Births               Home Medications    Prior to Admission medications   Medication Sig Start Date End Date Taking? Authorizing Provider  albuterol (PROVENTIL HFA;VENTOLIN HFA) 108 (90 Base) MCG/ACT inhaler Inhale 2 puffs into the lungs every 6 (six) hours as needed for wheezing or shortness of breath. 03/19/17   Willy Eddyobinson, Patrick, MD  busPIRone (BUSPAR) 10 MG tablet Take 1 tablet (10 mg total) by mouth 3 (three) times daily. 10/23/18   Tommie Samsook, Jayce G, DO  gabapentin (NEURONTIN) 300 MG capsule Take 3 capsules (900 mg total) by mouth 3 (three) times daily. 04/16/17   Pucilowska, Braulio ConteJolanta B, MD  hydrOXYzine (ATARAX/VISTARIL) 10 MG tablet Take 10 mg by mouth 3 (three) times daily as needed.    [provider]  levonorgestrel (MIRENA) 20 MCG/24HR IUD 1 each by Intrauterine route once.    [provider]  mirtazapine (REMERON) 30 MG tablet Take 30 mg  by mouth at bedtime.    [provider]  prazosin (MINIPRESS) 2 MG capsule Take 1 capsule (2 mg total) by mouth at bedtime. 04/15/17   Pucilowska, Braulio Conte B, MD  traMADol (ULTRAM) 50 MG tablet Take 1 tablet (50 mg total) by mouth every 6 (six) hours as needed. 12/18/18   Arthor Captain, PA-C  venlafaxine XR (EFFEXOR-XR) 150 MG 24 hr capsule Take 1 capsule (150 mg total) by mouth daily with breakfast. 05/02/17   Rockne Menghini, MD    Family History Family History  Problem Relation Age of Onset  . Diabetes Father   . Dementia Mother   . Anxiety disorder Mother     Social History Social History   Tobacco Use  . Smoking status:  Current Every Day Smoker    Packs/day: 0.00    Years: 20.00    Pack years: 0.00    Types: Cigarettes  . Smokeless tobacco: Never Used  . Tobacco comment: 1-2 cigarettes per day  Substance Use Topics  . Alcohol use: Yes    Comment: socially  . Drug use: No    Types: Benzodiazepines, Hydrocodone     Allergies   Depakote [valproic acid]; Haldol [haloperidol lactate]; Meloxicam; and Keflet [cephalexin]   Review of Systems Review of Systems Ten systems reviewed and are negative for acute change, except as noted in the HPI.    Physical Exam Updated Vital Signs BP (!) 141/80 (BP Location: Right Arm)   Pulse 100   Temp 99.5 F (37.5 C) (Oral)   Resp 18   Ht 5\' 8"  (1.727 m)   Wt 99.3 kg   SpO2 99%   BMI 33.29 kg/m   Physical Exam Vitals signs and nursing note reviewed.  Constitutional:      General: She is not in acute distress.    Appearance: She is well-developed. She is not diaphoretic.  HENT:     Head: Normocephalic and atraumatic.  Eyes:     General: No scleral icterus.    Conjunctiva/sclera: Conjunctivae normal.     Comments: Corneal ulcer visible without fluorescein stain on the right eye.  Surrounding injection of the cornea without chemosis.  Neck:     Musculoskeletal: Normal range of motion.  Cardiovascular:     Rate and Rhythm: Normal rate and regular rhythm.     Heart sounds: Normal heart sounds. No murmur. No friction rub. No gallop.   Pulmonary:     Effort: Pulmonary effort is normal. No respiratory distress.     Breath sounds: Normal breath sounds.  Abdominal:     General: Bowel sounds are normal. There is no distension.     Palpations: Abdomen is soft. There is no mass.     Tenderness: There is no abdominal tenderness. There is no guarding.  Skin:    General: Skin is warm and dry.  Neurological:     Mental Status: She is alert and oriented to person, place, and time.  Psychiatric:        Behavior: Behavior normal.      ED Treatments /  Results  Labs (all labs ordered are listed, but only abnormal results are displayed) Labs Reviewed - No data to display  EKG None  Radiology No results found.  Procedures Procedures (including critical care time)  Medications Ordered in ED Medications  oxyCODONE-acetaminophen (PERCOCET/ROXICET) 5-325 MG per tablet 1 tablet (1 tablet Oral Given 12/18/18 2224)  ondansetron (ZOFRAN-ODT) disintegrating tablet 4 mg (4 mg Oral Given 12/18/18 2224)  Initial Impression / Assessment and Plan / ED Course  I have reviewed the triage vital signs and the nursing notes.  Pertinent labs & imaging results that were available during my care of the patient were reviewed by me and considered in my medical decision making (see chart for details).      For pain control.  Have refilled her tramadol and given her dose of medication tonight.  Patient has a follow-up appointment with her ophthalmologist tomorrow  PDMP reviewed during this encounter.  Final Clinical Impressions(s) / ED Diagnoses   Final diagnoses:  Ulcer of right cornea    ED Discharge Orders         Ordered    traMADol (ULTRAM) 50 MG tablet  Every 6 hours PRN     12/18/18 2229           Arthor Captain, PA-C 12/18/18 2303    Jacalyn Lefevre, MD 12/23/18 [phone]

## 2018-12-18 NOTE — ED Triage Notes (Signed)
Previously diagnosed with a corneal ulcer to the right eye.  Reports having issues for about a week.  Reports eye specialist sent her here for pain medication.

## 2018-12-18 NOTE — ED Notes (Signed)
Patient verbalizes understanding of discharge instructions. Opportunity for questioning and answers were provided. Armband removed by staff, pt discharged from ED.  

## 2018-12-18 NOTE — ED Notes (Signed)
Tono pen, woods lamp at bedside.  Visual Acuity done and charted.

## 2018-12-18 NOTE — Discharge Instructions (Addendum)
Contact a health care provider if: °Your pain gets worse. °You have more discharge coming from your eye. °Get help right away if: °You have a change in vision. °Your eyelids or the skin around them is red or swollen. °

## 2018-12-23 ENCOUNTER — Emergency Department (HOSPITAL_COMMUNITY)
Admission: EM | Admit: 2018-12-23 | Discharge: 2018-12-24 | Disposition: A | Discharge status: Home / Self Care | Payer: Medicaid Other | Attending: Emergency Medicine | Admitting: Emergency Medicine

## 2018-12-23 ENCOUNTER — Other Ambulatory Visit: Payer: Self-pay

## 2018-12-23 ENCOUNTER — Encounter (HOSPITAL_COMMUNITY): Payer: Self-pay

## 2018-12-23 DIAGNOSIS — H5711 Ocular pain, right eye: Secondary | ICD-10-CM | POA: Diagnosis present

## 2018-12-23 DIAGNOSIS — F17210 Nicotine dependence, cigarettes, uncomplicated: Secondary | ICD-10-CM | POA: Insufficient documentation

## 2018-12-23 DIAGNOSIS — Z79899 Other long term (current) drug therapy: Secondary | ICD-10-CM | POA: Insufficient documentation

## 2018-12-23 DIAGNOSIS — H168 Other keratitis: Secondary | ICD-10-CM

## 2018-12-23 NOTE — ED Notes (Signed)
Nurse Navigator communication: Pt has been advised that her ride is outside. She has gone out to speak to him for the wait time.

## 2018-12-23 NOTE — ED Notes (Signed)
Pt. reports wearing glasses or contacts at all times Rt. Eye - 20/100 Lt.  Eye - 20/20

## 2018-12-23 NOTE — ED Triage Notes (Signed)
Pt arrives POV for eval of corneal ulcer to R eye onset 1.5weeks ago. Sent by opthal for further eval and pain management. Per documentation pt supposed to go to [facility] for eval, but unable to get there until Friday. 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 Pt visibly uncomfortably in triage.

## 2018-12-24 MED ORDER — OXYCODONE-ACETAMINOPHEN 5-325 MG PO TABS
1.0000 | ORAL_TABLET | Freq: Once | ORAL | Status: AC
  Administered 2018-12-24: 01:00:00 1 via ORAL
  Filled 2018-12-24: qty 1

## 2018-12-24 MED ORDER — NATAMYCIN 5 % OP SUSP
1.0000 [drp] | OPHTHALMIC | Status: DC
  Administered 2018-12-24: 02:00:00 1 [drp] via OPHTHALMIC
  Filled 2018-12-24: qty 15

## 2018-12-24 MED ORDER — ITRACONAZOLE 100 MG PO CAPS
200.0000 mg | ORAL_CAPSULE | Freq: Once | ORAL | Status: AC
  Administered 2018-12-24: 02:00:00 200 mg via ORAL
  Filled 2018-12-24: qty 2

## 2018-12-24 MED ORDER — TRAMADOL HCL 50 MG PO TABS: 50.0000 mg | ORAL_TABLET | Freq: Four times a day (QID) | ORAL | 0 refills | Status: DC | PRN

## 2018-12-24 NOTE — ED Notes (Signed)
Provider bedside.

## 2018-12-24 NOTE — ED Provider Notes (Signed)
[facility] EMERGENCY DEPARTMENT Provider Note   CSN: [id number] Arrival date & time: 12/23/18  1712    History   Chief Complaint Chief Complaint  Patient presents with  . Eye Pain    HPI Sandy Mason is a 43 y.o. female.     Patient presents the emergency department with complaint of right eye corneal ulcer.  Patient states that her symptoms started about 2 weeks ago.  She states that her contact fell out onto the floor of her car and she put it back in her eye.  The next day she developed pain and swelling around her eye.  Patient has been seen by Dr. Lara Mulch at [facility].  Initially she was being treated for bacterial keratitis with Vigamox, Polytrim, Polysporin drops.  She had a culture done which demonstrated a fungal infection.  She was told to start natamycin, itraconazole oral, continue Vigamox and Polytrim.  She states that she is was referred to [facility] however they cannot see her for 2 weeks so she was told to go to the emergency department.  However, patient presents to the [facility] ED tonight because she has transportation issues and will not be able to go to [facility] until Friday.  She has not established with a ophthalmologist at [facility].  She complains of continued pain in her right eye.  She had been taking tramadol for this.  Vision is still decreased in that side.     Past Medical History:  Diagnosis Date  . Alcohol use disorder severe. 12/18/2014  . Anxiety   . Borderline personality disorder (HCC) 12/18/2014  . CVA (cerebral infarction)   . Depression   . 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 Opioid use disorder, severe, dependence (HCC) 12/20/2014  . PTSD (post-traumatic stress disorder) 03/28/2015  . 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 Seizures (HCC)   . Suicide attempt by hanging [facility])     Patient Active Problem List   Diagnosis Date Noted  .  Major depressive disorder, recurrent severe without psychotic features (HCC) 04/11/2017  . Salicylate overdose 04/09/2017  . AKI (acute kidney injury) (HCC) 04/03/2016  . Suicidal behavior 01/16/2016  . Alcohol intoxication (HCC)   . Severe episode of recurrent major depressive disorder, without psychotic features (HCC)   . Malingering 12/16/2015  . Major depressive disorder, recurrent episode, moderate (HCC) 12/12/2015  . Closed pelvic fracture (HCC) 10/31/2015  . Tobacco abuse 10/31/2015  . Tobacco use disorder 03/28/2015  . PTSD (post-traumatic stress disorder) 03/28/2015  . Stimulant use disorder (cocaine) 03/28/2015  . 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 Opioid use disorder, severe, dependence (HCC) 12/20/2014  . Alcohol use disorder severe. 12/18/2014  . Borderline personality disorder (HCC) 12/18/2014    Past Surgical History:  Procedure Laterality Date  . NO PAST SURGERIES       OB History    Gravida  2   Para  2   Term  2   Preterm      AB      Living        SAB      TAB      Ectopic      Multiple      Live Births               Home Medications    Prior to Admission medications   Medication Sig Start Date End  Date Taking? Authorizing Provider  albuterol (PROVENTIL HFA;VENTOLIN HFA) 108 (90 Base) MCG/ACT inhaler Inhale 2 puffs into the lungs every 6 (six) hours as needed for wheezing or shortness of breath. 03/19/17   Willy Eddy, MD  busPIRone (BUSPAR) 10 MG tablet Take 1 tablet (10 mg total) by mouth 3 (three) times daily. 10/23/18   Tommie Sams, DO  gabapentin (NEURONTIN) 300 MG capsule Take 3 capsules (900 mg total) by mouth 3 (three) times daily. 04/16/17   Pucilowska, Braulio Conte B, MD  hydrOXYzine (ATARAX/VISTARIL) 10 MG tablet Take 10 mg by mouth 3 (three) times daily as needed.    [provider]  levonorgestrel (MIRENA) 20 MCG/24HR IUD 1 each by Intrauterine route once.     [provider]  mirtazapine (REMERON) 30 MG tablet Take 30 mg by mouth at bedtime.    [provider]  prazosin (MINIPRESS) 2 MG capsule Take 1 capsule (2 mg total) by mouth at bedtime. 04/15/17   Pucilowska, Braulio Conte B, MD  traMADol (ULTRAM) 50 MG tablet Take 1 tablet (50 mg total) by mouth every 6 (six) hours as needed. 12/18/18   Arthor Captain, PA-C  venlafaxine XR (EFFEXOR-XR) 150 MG 24 hr capsule Take 1 capsule (150 mg total) by mouth daily with breakfast. 05/02/17   Rockne Menghini, MD    Family History Family History  Problem Relation Age of Onset  . Diabetes Father   . Dementia Mother   . Anxiety disorder Mother     Social History Social History   Tobacco Use  . Smoking status: Current Every Day Smoker    Packs/day: 0.00    Years: 20.00    Pack years: 0.00    Types: Cigarettes  . Smokeless tobacco: Never Used  . Tobacco comment: 1-2 cigarettes per day  Substance Use Topics  . Alcohol use: Yes    Comment: socially  . Drug use: No    Types: Benzodiazepines, Hydrocodone     Allergies   Depakote [valproic acid]; Haldol [haloperidol lactate]; Meloxicam; and Keflet [cephalexin]   Review of Systems Review of Systems  Constitutional: Negative for fever.  HENT: Negative for rhinorrhea and sore throat.   Eyes: Positive for pain, redness and visual disturbance.  Respiratory: Negative for cough.   Cardiovascular: Negative for chest pain.  Gastrointestinal: Negative for abdominal pain, diarrhea, nausea and vomiting.  Genitourinary: Negative for dysuria.  Musculoskeletal: Negative for myalgias.  Skin: Negative for rash.  Neurological: Negative for headaches.     Physical Exam Updated Vital Signs BP (!) 135/96 (BP Location: Right Arm)   Pulse 79   Temp 98.8 F (37.1 C) (Oral)   Resp 16   Ht  (1.727 m)   Wt 95.3 kg   SpO2 100%   BMI 31.93 kg/m   Physical Exam Vitals signs and nursing note reviewed.  Constitutional:       Appearance: She is well-developed.  HENT:     Head: Normocephalic and atraumatic.  Eyes:     General:        Right eye: No discharge.        Left eye: No discharge.     Conjunctiva/sclera:     Right eye: Right conjunctiva is injected. Chemosis present.     Slit lamp exam:    Right eye: Corneal ulcer present.     Left eye: No corneal ulcer.   Neck:     Musculoskeletal: Normal range of motion and neck supple.  Cardiovascular:     Rate and  Rhythm: Normal rate and regular rhythm.     Heart sounds: Normal heart sounds.  Pulmonary:     Effort: Pulmonary effort is normal.     Breath sounds: Normal breath sounds.  Abdominal:     Palpations: Abdomen is soft.     Tenderness: There is no abdominal tenderness.  Skin:    General: Skin is warm and dry.  Neurological:     Mental Status: She is alert.      ED Treatments / Results  Labs (all labs ordered are listed, but only abnormal results are displayed) Labs Reviewed - No data to display  EKG None  Radiology No results found.  Procedures Procedures (including critical care time)  Medications Ordered in ED Medications  natamycin (NATACYN) 5 % ophthalmic suspension 1 drop (1 drop Right Eye Given 12/24/18 0135)  itraconazole (SPORANOX) capsule 200 mg (200 mg Oral Given 12/24/18 0133)  oxyCODONE-acetaminophen (PERCOCET/ROXICET) 5-325 MG per tablet 1 tablet (1 tablet Oral Given 12/24/18 0043)     Initial Impression / Assessment and Plan / ED Course  I have reviewed the triage vital signs and the nursing notes.  Pertinent labs & imaging results that were available during my care of the patient were reviewed by me and considered in my medical decision making (see chart for details).        Patient seen and examined. Reviewed records that she brought with her. Will start topical and oral fungal medications here, treat pain.    Vital signs reviewed and are as follows: BP (!) 135/96 (BP Location: Right Arm)   Pulse 79   Temp  98.8 F (37.1 C) (Oral)   Resp 16   Ht  (1.727 m)   Wt 95.3 kg   SpO2 100%   BMI 31.93 kg/m   Patient has received dose of itraconazole and has been given natamycin drops given first administration here.  She was treated with oxycodone 5 mg.  Patient has a prescription for itraconazole awaiting her to pick up tomorrow.  She will start using the natamycin drops every 1 hour while awake until she receives further instruction from her eye doctor.  I have represcribed tramadol for pain.  Patient encouraged to call her ophthalmologist tomorrow and ask if there is a provider in [place] who can take care of her condition to make it easier for her to see them in follow-up appropriately.  If all else fails, she will be able to go to [facility] on Friday (5/29) and will do so if necessary.   Encouraged return with any acute changes.  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  She is under the care of an ophthalmologist in [place].  Due to social issues there has been difficulty in obtaining appropriate follow-up as well as starting her medications.  I have done what I can tonight to address and provide medications tonight.  Patient will contact her doctor tomorrow and ask for a more local follow-up plan.  There are no indications on her exam that she needs emergent transfer or hospitalization for her condition.  She is aware that this is something that can threaten her vision if not addressed and I have encouraged her to continue to work on a reliable follow-up plan.  Currently there are some housing issues that are preventing the patient from getting appropriate care.  I do not suspect endophthalmitis on exam tonight.  No signs of septal or preseptal cellulitis.   ED Discharge Orders         Ordered    traMADol (ULTRAM) 50 MG tablet  Every 6 hours PRN     12/24/18 0143            Renne CriglerGeiple, Ciji Boston, PA-C 12/24/18 0150    Zadie RhineWickline, Donald, MD 12/24/18 0202

## 2018-12-24 NOTE — ED Notes (Signed)
Called pharmacy, requested medications, will be sent.  Updated pt.

## 2018-12-24 NOTE — Discharge Instructions (Signed)
You have a fungal infection in your eye.  Use the natamycin eyedrops every hour while awake until you are given further instructions by your ophthalmologist.  Please call your ophthalmologist tomorrow and ask if there is somebody that you can see in [place] so it will make it easier for you to follow-up.  If you cannot make any other arrangements, please go to [facility] as directed by her ophthalmologist in [place].

## 2018-12-29 ENCOUNTER — Other Ambulatory Visit: Payer: Self-pay

## 2018-12-29 ENCOUNTER — Emergency Department (HOSPITAL_COMMUNITY)
Admission: EM | Admit: 2018-12-29 | Discharge: 2018-12-29 | Disposition: A | Discharge status: Home / Self Care | Payer: Medicaid Other | Attending: Emergency Medicine | Admitting: Emergency Medicine

## 2018-12-29 ENCOUNTER — Encounter (HOSPITAL_COMMUNITY): Payer: Self-pay

## 2018-12-29 DIAGNOSIS — Z8673 Personal history of transient ischemic attack (TIA), and cerebral infarction without residual deficits: Secondary | ICD-10-CM | POA: Diagnosis not present

## 2018-12-29 DIAGNOSIS — H168 Other keratitis: Secondary | ICD-10-CM | POA: Diagnosis not present

## 2018-12-29 DIAGNOSIS — Z79899 Other long term (current) drug therapy: Secondary | ICD-10-CM | POA: Diagnosis not present

## 2018-12-29 DIAGNOSIS — F17210 Nicotine dependence, cigarettes, uncomplicated: Secondary | ICD-10-CM | POA: Diagnosis not present

## 2018-12-29 DIAGNOSIS — H5711 Ocular pain, right eye: Secondary | ICD-10-CM | POA: Diagnosis present

## 2018-12-29 MED ORDER — HYDROCODONE-ACETAMINOPHEN 5-325 MG PO TABS
1.0000 | ORAL_TABLET | Freq: Once | ORAL | Status: AC
  Administered 2018-12-29: 1 via ORAL
  Filled 2018-12-29: qty 1

## 2018-12-29 MED ORDER — HYDROCODONE-ACETAMINOPHEN 5-325 MG PO TABS: 1.0000 | ORAL_TABLET | ORAL | 0 refills | Status: DC | PRN

## 2018-12-29 NOTE — ED Triage Notes (Signed)
Pt arrive POV for "something for pain". Pt has known R eye ulcer and is supposed to f/u w/ [facility] this week on Wednesday. Pt states that she is unable to wait until that time for pain medication.

## 2018-12-29 NOTE — ED Provider Notes (Signed)
[facility] EMERGENCY DEPARTMENT Provider Note   CSN: [id number] Arrival date & time: 12/29/18  1237    History   Chief Complaint Chief Complaint  Patient presents with  . Eye Pain    HPI Sandy Mason is a 43 y.o. female.     HPI  Patient is a 43 year old female with past medical history of anxiety, depression, fungal ulcer of right eye presenting for worsening pain of the right eye.  Patient has an appointment in 48 hours with [facility] ophthalmology.  She has been followed by an ophthalmologist, Dr. Lara Mulch of [facility], in [place].  She is receiving topical therapies for this fungal infection as well as systemic antibiotics.  She denies any increasing swelling, drainage from the eye.  She denies any fevers, chills, nausea vomiting, joint pain.  Patient reports that she has had transportation difficulties making it hard to get to [facility] to receive care.   Past Medical History:  Diagnosis Date  . Alcohol use disorder severe. 12/18/2014  . Anxiety   . Borderline personality disorder (HCC) 12/18/2014  . CVA (cerebral infarction)   . Depression   . 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 Opioid use disorder, severe, dependence (HCC) 12/20/2014  . PTSD (post-traumatic stress disorder) 03/28/2015  . 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 Seizures (HCC)   . Suicide attempt by hanging [facility])     Patient Active Problem List   Diagnosis Date Noted  . Major depressive disorder, recurrent severe without psychotic features (HCC) 04/11/2017  . Salicylate overdose 04/09/2017  . AKI (acute kidney injury) (HCC) 04/03/2016  . Suicidal behavior 01/16/2016  . Alcohol intoxication (HCC)   . Severe episode of recurrent major depressive disorder, without psychotic features (HCC)   . Malingering 12/16/2015  . Major depressive disorder, recurrent episode, moderate (HCC)  12/12/2015  . Closed pelvic fracture (HCC) 10/31/2015  . Tobacco abuse 10/31/2015  . Tobacco use disorder 03/28/2015  . PTSD (post-traumatic stress disorder) 03/28/2015  . Stimulant use disorder (cocaine) 03/28/2015  . 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 Opioid use disorder, severe, dependence (HCC) 12/20/2014  . Alcohol use disorder severe. 12/18/2014  . Borderline personality disorder (HCC) 12/18/2014    Past Surgical History:  Procedure Laterality Date  . NO PAST SURGERIES       OB History    Gravida  2   Para  2   Term  2   Preterm      AB      Living        SAB      TAB      Ectopic      Multiple      Live Births               Home Medications    Prior to Admission medications   Medication Sig Start Date End Date Taking? Authorizing Provider  albuterol (PROVENTIL HFA;VENTOLIN HFA) 108 (90 Base) MCG/ACT inhaler Inhale 2 puffs into the lungs every 6 (six) hours as needed for wheezing or shortness of breath. 03/19/17   Willy Eddy, MD  busPIRone (BUSPAR) 10 MG tablet Take 1 tablet (10 mg total) by mouth 3 (three) times daily. 10/23/18   Tommie Sams, DO  gabapentin (NEURONTIN) 300 MG capsule Take 3 capsules (900 mg total) by mouth 3 (three) times daily. 04/16/17   Pucilowska, Ellin Goodie, MD  HYDROcodone-acetaminophen (  NORCO/VICODIN) 5-325 MG tablet Take 1 tablet by mouth every 4 (four) hours as needed. 12/29/18   Aviva KluverMurray,  B, PA-C  hydrOXYzine (ATARAX/VISTARIL) 10 MG tablet Take 10 mg by mouth 3 (three) times daily as needed.    [provider]  levonorgestrel (MIRENA) 20 MCG/24HR IUD 1 each by Intrauterine route once.    [provider]  mirtazapine (REMERON) 30 MG tablet Take 30 mg by mouth at bedtime.    [provider]  prazosin (MINIPRESS) 2 MG capsule Take 1 capsule (2 mg total) by mouth at bedtime. 04/15/17   Pucilowska, Ellin GoodieJolanta B, MD  venlafaxine XR (EFFEXOR-XR) 150  MG 24 hr capsule Take 1 capsule (150 mg total) by mouth daily with breakfast. 05/02/17   Rockne MenghiniNorman, Anne-Caroline, MD    Family History Family History  Problem Relation Age of Onset  . Diabetes Father   . Dementia Mother   . Anxiety disorder Mother     Social History Social History   Tobacco Use  . Smoking status: Current Every Day Smoker    Packs/day: 0.00    Years: 20.00    Pack years: 0.00    Types: Cigarettes  . Smokeless tobacco: Never Used  . Tobacco comment: 1-2 cigarettes per day  Substance Use Topics  . Alcohol use: Yes    Comment: socially  . Drug use: No    Types: Benzodiazepines, Hydrocodone     Allergies   Depakote [valproic acid]; Haldol [haloperidol lactate]; Meloxicam; and Keflet [cephalexin]   Review of Systems Review of Systems  Constitutional: Negative for chills and fever.  HENT: Negative for congestion, rhinorrhea and sore throat.   Eyes: Positive for photophobia, pain and visual disturbance.  Musculoskeletal: Negative for arthralgias.  Skin: Negative for rash.     Physical Exam Updated Vital Signs BP 122/78 (BP Location: Right Arm)   Pulse 76   Temp 98.3 F (36.8 C) (Oral)   Resp 18   SpO2 97%   Physical Exam Vitals signs and nursing note reviewed.  Constitutional:      General: She is not in acute distress.    Appearance: She is well-developed. She is not diaphoretic.     Comments: Sitting comfortably in bed.  HENT:     Head: Normocephalic and atraumatic.     Mouth/Throat:     Comments: Right eye exam: Patient exhibits a space ulceration overlying her right cornea.  No proptosis. No periorbital erythema or edema. She has conjunctival and scleral injection.  There is some residue and eversion of the lid.  No active drainage from the eye.  Extraocular motions are intact. PERRL.  Eyes:     General:        Right eye: No discharge.        Left eye: No discharge.     Conjunctiva/sclera: Conjunctivae normal.     Comments: EOMs normal to  gross examination.  Neck:     Musculoskeletal: Normal range of motion.  Cardiovascular:     Rate and Rhythm: Normal rate and regular rhythm.     Comments: Intact, 2+ radial pulse. Pulmonary:     Comments: Converses comfortably. No audible wheeze or stridor.  Abdominal:     General: There is no distension.  Musculoskeletal: Normal range of motion.  Skin:    General: Skin is warm and dry.  Neurological:     Mental Status: She is alert.     Comments: Cranial nerves intact to gross observation. Patient moves extremities without difficulty.  Psychiatric:  Behavior: Behavior normal.        Thought Content: Thought content normal.        Judgment: Judgment normal.      ED Treatments / Results  Labs (all labs ordered are listed, but only abnormal results are displayed) Labs Reviewed - No data to display  EKG None  Radiology No results found.  Procedures Procedures (including critical care time)  Medications Ordered in ED Medications  HYDROcodone-acetaminophen (NORCO/VICODIN) 5-325 MG per tablet 1 tablet (1 tablet Oral Given 12/29/18 1501)     Initial Impression / Assessment and Plan / ED Course  I have reviewed the triage vital signs and the nursing notes.  Pertinent labs & imaging results that were available during my care of the patient were reviewed by me and considered in my medical decision making (see chart for details).  Clinical Course as of Dec 28 2228  Mon Dec 29, 2018  1427 I have reviewed the patient's information in the [place] Controlled Substance Database for the past 12 months and found them to have no Rx overlapping. Patient was counseled on the one-day limitation of her medication.  Opiates were prescribed for an acute, painful condition. The patient was given information on side effects and encouraged to use other, non-opiate pain medication primary, only using opiate medicine sparingly for severe pain.   [AM]  1431 Switched to Norco as pt  is on multiple serotinergic drugs.    [AM]    Clinical Course User Index [AM] Elisha Ponder, PA-C       Patient is a well-appearing 43 year old female with a known right corneal ulcer presenting for continued pain in the right eye.  Fungal infection does not appear to be worsening. She is supposed be following up in 48 hours with [facility] ophthalmology.  Patient has been prescribed tramadol for her symptoms.  On review of medications, she is on multiple serotonergic agents that could interact with Tramadol.  I discussed with the patient that continued opiate prescribing is inappropriate for this condition and she needs definitive management of the site threatening condition. She is given Norco #4 to bridge patient to appointment but instructed patient that she will not be able to receive further opiate pain medications from the ED and needs ophthalmology for definitive management.  She is instructed return for new or worsening symptoms.  Final Clinical Impressions(s) / ED Diagnoses   Final diagnoses:  Fungal keratitis    ED Discharge Orders         Ordered    HYDROcodone-acetaminophen (NORCO/VICODIN) 5-325 MG tablet  Every 4 hours PRN     12/29/18 1430           Elisha Ponder, PA-C 12/29/18 2240    Margarita Grizzle, MD 12/30/18 1530

## 2018-12-29 NOTE — Discharge Instructions (Signed)
You have been prescribed Norco for pain. This is an opioid pain medication. You may take this medication every 4-6 hours as needed for pain. Only take this medication if you need it for breakthrough pain. You may combine this medicine with ibuprofen, a non-steroidal anti-inflammatory drug (NSAID) every 6 hours, so you are getting something for pain relief every 3 hours.  Do not combine this medication with Tylenol, as it may increase the risk of liver problems.  Do not combine this medication with alcohol.  Please be advised to avoid driving or operating heavy machinery while taking this medication, as it may make you drowsy or impair judgment.    Please ensure that you follow-up with [facility].  Is very important as your condition can be sight threatening.

## 2018-12-29 NOTE — ED Notes (Signed)
Patient verbalizes understanding of discharge instructions. Opportunity for questioning and answers were provided. Armband removed by staff, pt discharged from ED ambulatory w/ friend waiting in circle

## 2019-01-01 LAB — MISC LABCORP TEST (SEND OUT): Labcorp test code: 180803

## 2019-01-16 LAB — FUNGUS CULTURE WITH STAIN

## 2019-01-16 LAB — FUNGAL ORGANISM REFLEX

## 2019-02-28 ENCOUNTER — Emergency Department: Payer: Medicaid Other

## 2019-02-28 ENCOUNTER — Encounter: Payer: Self-pay | Admitting: Emergency Medicine

## 2019-02-28 ENCOUNTER — Emergency Department
Admission: EM | Admit: 2019-02-28 | Discharge: 2019-02-28 | Disposition: A | Discharge status: Home / Self Care | Payer: Medicaid Other | Attending: Emergency Medicine | Admitting: Emergency Medicine

## 2019-02-28 ENCOUNTER — Other Ambulatory Visit: Payer: Self-pay

## 2019-02-28 ENCOUNTER — Emergency Department (EMERGENCY_DEPARTMENT_HOSPITAL)
Admission: EM | Admit: 2019-02-28 | Discharge: 2019-02-28 | Disposition: A | Discharge status: Home / Self Care | Payer: Medicaid Other | Source: Home / Self Care | Attending: Emergency Medicine | Admitting: Emergency Medicine

## 2019-02-28 DIAGNOSIS — F17210 Nicotine dependence, cigarettes, uncomplicated: Secondary | ICD-10-CM | POA: Insufficient documentation

## 2019-02-28 DIAGNOSIS — T07XXXA Unspecified multiple injuries, initial encounter: Secondary | ICD-10-CM | POA: Diagnosis present

## 2019-02-28 DIAGNOSIS — Z9189 Other specified personal risk factors, not elsewhere classified: Secondary | ICD-10-CM

## 2019-02-28 DIAGNOSIS — Y998 Other external cause status: Secondary | ICD-10-CM | POA: Insufficient documentation

## 2019-02-28 DIAGNOSIS — F339 Major depressive disorder, recurrent, unspecified: Secondary | ICD-10-CM | POA: Diagnosis not present

## 2019-02-28 DIAGNOSIS — F10920 Alcohol use, unspecified with intoxication, uncomplicated: Secondary | ICD-10-CM | POA: Diagnosis not present

## 2019-02-28 DIAGNOSIS — Z23 Encounter for immunization: Secondary | ICD-10-CM | POA: Insufficient documentation

## 2019-02-28 DIAGNOSIS — F603 Borderline personality disorder: Secondary | ICD-10-CM | POA: Insufficient documentation

## 2019-02-28 DIAGNOSIS — Y9389 Activity, other specified: Secondary | ICD-10-CM | POA: Diagnosis not present

## 2019-02-28 DIAGNOSIS — R45851 Suicidal ideations: Secondary | ICD-10-CM

## 2019-02-28 DIAGNOSIS — Z5982 Transportation insecurity: Secondary | ICD-10-CM

## 2019-02-28 DIAGNOSIS — F1099 Alcohol use, unspecified with unspecified alcohol-induced disorder: Secondary | ICD-10-CM | POA: Insufficient documentation

## 2019-02-28 DIAGNOSIS — F1910 Other psychoactive substance abuse, uncomplicated: Secondary | ICD-10-CM | POA: Insufficient documentation

## 2019-02-28 DIAGNOSIS — Z79899 Other long term (current) drug therapy: Secondary | ICD-10-CM | POA: Insufficient documentation

## 2019-02-28 DIAGNOSIS — Y92410 Unspecified street and highway as the place of occurrence of the external cause: Secondary | ICD-10-CM | POA: Diagnosis not present

## 2019-02-28 DIAGNOSIS — F1010 Alcohol abuse, uncomplicated: Secondary | ICD-10-CM

## 2019-02-28 DIAGNOSIS — F4310 Post-traumatic stress disorder, unspecified: Secondary | ICD-10-CM | POA: Insufficient documentation

## 2019-02-28 DIAGNOSIS — Z8673 Personal history of transient ischemic attack (TIA), and cerebral infarction without residual deficits: Secondary | ICD-10-CM | POA: Insufficient documentation

## 2019-02-28 LAB — COMPREHENSIVE METABOLIC PANEL
ALT: 23 U/L (ref 0–44)
ALT: 22 U/L (ref 0–44)
AST: 25 U/L (ref 15–41)
AST: 22 U/L (ref 15–41)
Albumin: 4.6 g/dL (ref 3.5–5.0)
Albumin: 4.4 g/dL (ref 3.5–5.0)
Alkaline Phosphatase: 83 U/L (ref 38–126)
Alkaline Phosphatase: 80 U/L (ref 38–126)
Anion gap: 11 (ref 5–15)
Anion gap: 9 (ref 5–15)
BUN: 8 mg/dL (ref 6–20)
BUN: 8 mg/dL (ref 6–20)
CO2: 24 mmol/L (ref 22–32)
CO2: 22 mmol/L (ref 22–32)
Calcium: 9.5 mg/dL (ref 8.9–10.3)
Calcium: 8.9 mg/dL (ref 8.9–10.3)
Chloride: 110 mmol/L (ref 98–111)
Chloride: 112 mmol/L — ABNORMAL HIGH (ref 98–111)
Creatinine, Ser: 0.73 mg/dL (ref 0.44–1.00)
Creatinine, Ser: 0.57 mg/dL (ref 0.44–1.00)
GFR calc Af Amer: >60 mL/min (ref >60)
GFR calc Af Amer: >60 mL/min (ref >60)
GFR calc non Af Amer: >60 mL/min (ref >60)
GFR calc non Af Amer: >60 mL/min (ref >60)
Glucose, Bld: 104 mg/dL — ABNORMAL HIGH (ref 70–99)
Glucose, Bld: 105 mg/dL — ABNORMAL HIGH (ref 70–99)
Potassium: 3.5 mmol/L (ref 3.5–5.1)
Potassium: 3.6 mmol/L (ref 3.5–5.1)
Sodium: 145 mmol/L (ref 135–145)
Sodium: 143 mmol/L (ref 135–145)
Total Bilirubin: 0.3 mg/dL (ref 0.3–1.2)
Total Bilirubin: 0.4 mg/dL (ref 0.3–1.2)
Total Protein: 8.8 g/dL — ABNORMAL HIGH (ref 6.5–8.1)
Total Protein: 8.2 g/dL — ABNORMAL HIGH (ref 6.5–8.1)

## 2019-02-28 LAB — CBC
HCT: 43.2 % (ref 36.0–46.0)
Hemoglobin: 14.8 g/dL (ref 12.0–15.0)
MCH: 31.2 pg (ref 26.0–34.0)
MCHC: 34.3 g/dL (ref 30.0–36.0)
MCV: 91.1 fL (ref 80.0–100.0)
Platelets: 337 10*3/uL (ref 150–400)
RBC: 4.74 MIL/uL (ref 3.87–5.11)
RDW: 12.8 % (ref 11.5–15.5)
WBC: 9 10*3/uL (ref 4.0–10.5)
nRBC: 0 % (ref 0.0–0.2)

## 2019-02-28 LAB — URINALYSIS, COMPLETE (UACMP) WITH MICROSCOPIC
Bacteria, UA: NONE SEEN
Bilirubin Urine: NEGATIVE
Glucose, UA: NEGATIVE mg/dL
Hgb urine dipstick: NEGATIVE
Ketones, ur: NEGATIVE mg/dL
Leukocytes,Ua: NEGATIVE
Nitrite: NEGATIVE
Protein, ur: NEGATIVE mg/dL
Specific Gravity, Urine: 1.002 — ABNORMAL LOW (ref 1.005–1.030)
WBC, UA: NONE SEEN WBC/hpf (ref 0–5)
pH: 6 (ref 5.0–8.0)

## 2019-02-28 LAB — CBC WITH DIFFERENTIAL/PLATELET
Abs Immature Granulocytes: 0.05 10*3/uL (ref 0.00–0.07)
Basophils Absolute: 0.1 10*3/uL (ref 0.0–0.1)
Basophils Relative: 1 %
Eosinophils Absolute: 0.1 10*3/uL (ref 0.0–0.5)
Eosinophils Relative: 1 %
HCT: 45.2 % (ref 36.0–46.0)
Hemoglobin: 15.3 g/dL — ABNORMAL HIGH (ref 12.0–15.0)
Immature Granulocytes: 1 %
Lymphocytes Relative: 38 %
Lymphs Abs: 4 10*3/uL (ref 0.7–4.0)
MCH: 31.2 pg (ref 26.0–34.0)
MCHC: 33.8 g/dL (ref 30.0–36.0)
MCV: 92.1 fL (ref 80.0–100.0)
Monocytes Absolute: 0.7 10*3/uL (ref 0.1–1.0)
Monocytes Relative: 6 %
Neutro Abs: 5.7 10*3/uL (ref 1.7–7.7)
Neutrophils Relative %: 53 %
Platelets: 337 10*3/uL (ref 150–400)
RBC: 4.91 MIL/uL (ref 3.87–5.11)
RDW: 12.8 % (ref 11.5–15.5)
WBC: 10.6 10*3/uL — ABNORMAL HIGH (ref 4.0–10.5)
nRBC: 0 % (ref 0.0–0.2)

## 2019-02-28 LAB — URINE DRUG SCREEN, QUALITATIVE (ARMC ONLY)
Amphetamines, Ur Screen: NOT DETECTED
Barbiturates, Ur Screen: NOT DETECTED
Benzodiazepine, Ur Scrn: NOT DETECTED
Cannabinoid 50 Ng, Ur ~~LOC~~: NOT DETECTED
Cocaine Metabolite,Ur ~~LOC~~: POSITIVE — AB
MDMA (Ecstasy)Ur Screen: NOT DETECTED
Methadone Scn, Ur: NOT DETECTED
Opiate, Ur Screen: NOT DETECTED
Phencyclidine (PCP) Ur S: NOT DETECTED
Tricyclic, Ur Screen: NOT DETECTED

## 2019-02-28 LAB — SALICYLATE LEVEL: Salicylate Lvl: <7 mg/dL (ref 2.8–30.0)

## 2019-02-28 LAB — ACETAMINOPHEN LEVEL: Acetaminophen (Tylenol), Serum: 10 ug/mL (ref 10–30)

## 2019-02-28 LAB — PROTIME-INR
INR: 1.1 (ref 0.8–1.2)
Prothrombin Time: 13.8 seconds (ref 11.4–15.2)

## 2019-02-28 LAB — ETHANOL
Alcohol, Ethyl (B): 274 mg/dL — ABNORMAL HIGH (ref <10)
Alcohol, Ethyl (B): 190 mg/dL — ABNORMAL HIGH (ref <10)

## 2019-02-28 LAB — HCG, QUANTITATIVE, PREGNANCY: hCG, Beta Chain, Quant, S: <1 m[IU]/mL (ref <5)

## 2019-02-28 LAB — POCT PREGNANCY, URINE: Preg Test, Ur: NEGATIVE

## 2019-02-28 MED ORDER — SODIUM CHLORIDE 0.9 % IV BOLUS
1000.0000 mL | Freq: Once | INTRAVENOUS | Status: AC
  Administered 2019-02-28: 08:00:00 1000 mL via INTRAVENOUS

## 2019-02-28 MED ORDER — IOHEXOL 300 MG/ML  SOLN
100.0000 mL | Freq: Once | INTRAMUSCULAR | Status: AC | PRN
  Administered 2019-02-28: 07:00:00 100 mL via INTRAVENOUS

## 2019-02-28 MED ORDER — ACETAMINOPHEN 500 MG PO TABS
ORAL_TABLET | ORAL | Status: AC
  Administered 2019-02-28: 07:00:00 1000 mg via ORAL
  Filled 2019-02-28: qty 2

## 2019-02-28 MED ORDER — TETANUS-DIPHTH-ACELL PERTUSSIS 5-2.5-18.5 LF-MCG/0.5 IM SUSP
0.5000 mL | Freq: Once | INTRAMUSCULAR | Status: AC
  Administered 2019-02-28: 07:00:00 0.5 mL via INTRAMUSCULAR
  Filled 2019-02-28: qty 0.5

## 2019-02-28 MED ORDER — KETOROLAC TROMETHAMINE 30 MG/ML IJ SOLN
15.0000 mg | INTRAMUSCULAR | Status: AC
  Administered 2019-02-28: 08:00:00 15 mg via INTRAVENOUS
  Filled 2019-02-28: qty 1

## 2019-02-28 MED ORDER — ACETAMINOPHEN 500 MG PO TABS
1000.0000 mg | ORAL_TABLET | Freq: Once | ORAL | Status: AC
  Administered 2019-02-28: 1000 mg via ORAL

## 2019-02-28 NOTE — ED Notes (Signed)
Pt dressed out into psych paper scrubs.   Belongings bad include : nike shoes, white socks, orange shirt, leggings, black underwear, and 7 rings, 1 hoop earring and 1 stud earring.
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
## 2019-02-28 NOTE — ED Notes (Signed)
Assisted pt to use bathroom 

## 2019-02-28 NOTE — ED Provider Notes (Signed)
Procedures  Clinical Course as of Feb 27 945  Sat Feb 28, 2019  0818 CT head face neck chest abdomen pelvis all unremarkable.  We will continue to observe the patient in the ED until clinically sober at which point she is stable for discharge.   [PS]    Clinical Course User Index [PS] Carrie Mew, MD    ----------------------------------------- 9:46 AM on 02/28/2019 -----------------------------------------  Ambulatory with steady gait, tol PO. Clear speech.clinically sober. Trauma workup negative. Wants to go home, stable for DC.   Final diagnoses:  Abrasions of multiple sites  Alcoholic intoxication without complication [facility])      Carrie Mew, MD 02/28/19 [id number]

## 2019-02-28 NOTE — ED Notes (Signed)
First Nurse Note: Pt just D/C from ED, pt states that she does not have a ride home and does not have a phone to call anyone. Pt states that if she has to walk home that she will walk out in front of a car because she does not want to be here anymore.

## 2019-02-28 NOTE — ED Notes (Signed)
Pt got sandwich tray and is resting.   Lm edt
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
## 2019-02-28 NOTE — ED Notes (Signed)
Pt given back belongings prior to discharge.

## 2019-02-28 NOTE — ED Notes (Signed)
Patient transported to CT 
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
## 2019-02-28 NOTE — ED Notes (Signed)
Pt given cab voucher at [employer].

## 2019-02-28 NOTE — ED Notes (Signed)
Pt states she "just wants to leave"- Dr Joni Fears aware

## 2019-02-28 NOTE — Discharge Instructions (Addendum)
Your x-rays and CT scans today were all okay and do not show any serious injuries.

## 2019-02-28 NOTE — ED Notes (Signed)
Pt given water and graham crackers at this time 
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
## 2019-03-01 LAB — URINE CULTURE: Culture: <10000 — AB

## 2019-03-15 ENCOUNTER — Emergency Department
Admission: EM | Admit: 2019-03-15 | Discharge: 2019-03-15 | Disposition: A | Discharge status: Home / Self Care | Payer: Medicaid Other | Attending: Emergency Medicine | Admitting: Emergency Medicine

## 2019-03-15 ENCOUNTER — Emergency Department: Payer: Medicaid Other

## 2019-03-15 ENCOUNTER — Other Ambulatory Visit: Payer: Self-pay

## 2019-03-15 DIAGNOSIS — Z881 Allergy status to other antibiotic agents status: Secondary | ICD-10-CM | POA: Insufficient documentation

## 2019-03-15 DIAGNOSIS — T40601A Poisoning by unspecified narcotics, accidental (unintentional), initial encounter: Secondary | ICD-10-CM

## 2019-03-15 DIAGNOSIS — F17210 Nicotine dependence, cigarettes, uncomplicated: Secondary | ICD-10-CM | POA: Diagnosis not present

## 2019-03-15 DIAGNOSIS — T402X1A Poisoning by other opioids, accidental (unintentional), initial encounter: Secondary | ICD-10-CM | POA: Diagnosis not present

## 2019-03-15 DIAGNOSIS — Z888 Allergy status to other drugs, medicaments and biological substances status: Secondary | ICD-10-CM | POA: Diagnosis not present

## 2019-03-15 DIAGNOSIS — Z8673 Personal history of transient ischemic attack (TIA), and cerebral infarction without residual deficits: Secondary | ICD-10-CM | POA: Insufficient documentation

## 2019-03-15 LAB — COMPREHENSIVE METABOLIC PANEL
ALT: 44 U/L (ref 0–44)
AST: 47 U/L — ABNORMAL HIGH (ref 15–41)
Albumin: 3.9 g/dL (ref 3.5–5.0)
Alkaline Phosphatase: 65 U/L (ref 38–126)
Anion gap: 15 (ref 5–15)
BUN: 7 mg/dL (ref 6–20)
CO2: 25 mmol/L (ref 22–32)
Calcium: 8.4 mg/dL — ABNORMAL LOW (ref 8.9–10.3)
Chloride: 94 mmol/L — ABNORMAL LOW (ref 98–111)
Creatinine, Ser: 0.68 mg/dL (ref 0.44–1.00)
GFR calc Af Amer: >60 mL/min (ref >60)
GFR calc non Af Amer: >60 mL/min (ref >60)
Glucose, Bld: 168 mg/dL — ABNORMAL HIGH (ref 70–99)
Potassium: 3.3 mmol/L — ABNORMAL LOW (ref 3.5–5.1)
Sodium: 134 mmol/L — ABNORMAL LOW (ref 135–145)
Total Bilirubin: 0.5 mg/dL (ref 0.3–1.2)
Total Protein: 7.5 g/dL (ref 6.5–8.1)

## 2019-03-15 LAB — CBC WITH DIFFERENTIAL/PLATELET
Abs Immature Granulocytes: 0.05 10*3/uL (ref 0.00–0.07)
Basophils Absolute: 0 10*3/uL (ref 0.0–0.1)
Basophils Relative: 0 %
Eosinophils Absolute: 0.1 10*3/uL (ref 0.0–0.5)
Eosinophils Relative: 1 %
HCT: 40.4 % (ref 36.0–46.0)
Hemoglobin: 13.3 g/dL (ref 12.0–15.0)
Immature Granulocytes: 1 %
Lymphocytes Relative: 16 %
Lymphs Abs: 1.4 10*3/uL (ref 0.7–4.0)
MCH: 31.1 pg (ref 26.0–34.0)
MCHC: 32.9 g/dL (ref 30.0–36.0)
MCV: 94.4 fL (ref 80.0–100.0)
Monocytes Absolute: 0.6 10*3/uL (ref 0.1–1.0)
Monocytes Relative: 6 %
Neutro Abs: 6.8 10*3/uL (ref 1.7–7.7)
Neutrophils Relative %: 76 %
Platelets: 265 10*3/uL (ref 150–400)
RBC: 4.28 MIL/uL (ref 3.87–5.11)
RDW: 12.3 % (ref 11.5–15.5)
WBC: 8.9 10*3/uL (ref 4.0–10.5)
nRBC: 0 % (ref 0.0–0.2)

## 2019-03-15 LAB — URINALYSIS, COMPLETE (UACMP) WITH MICROSCOPIC
Bacteria, UA: NONE SEEN
Bilirubin Urine: NEGATIVE
Glucose, UA: NEGATIVE mg/dL
Hgb urine dipstick: NEGATIVE
Ketones, ur: NEGATIVE mg/dL
Nitrite: NEGATIVE
Protein, ur: 30 mg/dL — AB
Specific Gravity, Urine: 1.009 (ref 1.005–1.030)
pH: 6 (ref 5.0–8.0)

## 2019-03-15 LAB — URINE DRUG SCREEN, QUALITATIVE (ARMC ONLY)
Amphetamines, Ur Screen: NOT DETECTED
Barbiturates, Ur Screen: NOT DETECTED
Benzodiazepine, Ur Scrn: POSITIVE — AB
Cannabinoid 50 Ng, Ur ~~LOC~~: NOT DETECTED
Cocaine Metabolite,Ur ~~LOC~~: NOT DETECTED
MDMA (Ecstasy)Ur Screen: NOT DETECTED
Methadone Scn, Ur: NOT DETECTED
Opiate, Ur Screen: POSITIVE — AB
Phencyclidine (PCP) Ur S: NOT DETECTED
Tricyclic, Ur Screen: NOT DETECTED

## 2019-03-15 LAB — ACETAMINOPHEN LEVEL: Acetaminophen (Tylenol), Serum: <10 ug/mL — ABNORMAL LOW (ref 10–30)

## 2019-03-15 LAB — ETHANOL: Alcohol, Ethyl (B): <10 mg/dL (ref <10)

## 2019-03-15 LAB — HCG, QUANTITATIVE, PREGNANCY: hCG, Beta Chain, Quant, S: <1 m[IU]/mL (ref <5)

## 2019-03-15 LAB — GLUCOSE, CAPILLARY: Glucose-Capillary: 164 mg/dL — ABNORMAL HIGH (ref 70–99)

## 2019-03-15 LAB — SALICYLATE LEVEL: Salicylate Lvl: <7 mg/dL (ref 2.8–30.0)

## 2019-03-15 MED ORDER — NALOXONE HCL 4 MG/0.1ML NA LIQD: NASAL | 1 refills | Status: DC

## 2019-03-15 MED ORDER — NALOXONE HCL 2 MG/2ML IJ SOSY
1.0000 mg | PREFILLED_SYRINGE | Freq: Once | INTRAMUSCULAR | Status: AC
  Administered 2019-03-15: 1 mg via INTRAVENOUS
  Filled 2019-03-15: qty 2

## 2019-03-15 MED ORDER — SODIUM CHLORIDE 0.9 % IV SOLN
Freq: Once | INTRAVENOUS | Status: AC
  Administered 2019-03-15: 19:00:00 via INTRAVENOUS

## 2019-03-15 MED ORDER — NALOXONE HCL 2 MG/2ML IJ SOSY
2.0000 mg | PREFILLED_SYRINGE | Freq: Once | INTRAMUSCULAR | Status: AC
  Administered 2019-03-15: 19:00:00 2 mg via INTRAVENOUS
  Filled 2019-03-15: qty 2

## 2019-03-15 NOTE — ED Provider Notes (Signed)
[facility] Emergency Department Provider Note       Time seen: ----------------------------------------- 7:14 PM on 03/15/2019 ----------------------------------------- Level V caveat: History/ROS limited by altered mental status  I have reviewed the triage vital signs and the nursing notes.  HISTORY   Chief Complaint Drug Overdose    HPI Sandy Mason is a 43 y.o. female with a history of alcohol use disorder, anxiety, CVA, depression, opioid use disorder, sedative use disorder, suicidal ideation who presents to the ED for overdose.  Patient was by EMS after overdosing.  She was found by her roommates brother with a needle still in her arm.  Patient told EMS she only did heroin today.  She had 2 mg of Narcan intranasally.  No further information or review of systems is available.  Past Medical History:  Diagnosis Date  . Alcohol use disorder severe. 12/18/2014  . Anxiety   . Borderline personality disorder (HCC) 12/18/2014  . CVA (cerebral infarction)   . Depression   . 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 Opioid use disorder, severe, dependence (HCC) 12/20/2014  . PTSD (post-traumatic stress disorder) 03/28/2015  . 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 Seizures (HCC)   . Suicide attempt by hanging [facility](HCC)     Patient Active Problem List   Diagnosis Date Noted  . Major depressive disorder, recurrent severe without psychotic features (HCC) 04/11/2017  . Salicylate overdose 04/09/2017  . AKI (acute kidney injury) (HCC) 04/03/2016  . Suicidal behavior 01/16/2016  . Alcohol intoxication (HCC)   . Severe episode of recurrent major depressive disorder, without psychotic features (HCC)   . Malingering 12/16/2015  . Major depressive disorder, recurrent episode, moderate (HCC) 12/12/2015  . Closed pelvic fracture (HCC) 10/31/2015  . Tobacco abuse 10/31/2015  .  Tobacco use disorder 03/28/2015  . PTSD (post-traumatic stress disorder) 03/28/2015  . Stimulant use disorder (cocaine) 03/28/2015  . 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 Opioid use disorder, severe, dependence (HCC) 12/20/2014  . Alcohol use disorder severe. 12/18/2014  . Borderline personality disorder (HCC) 12/18/2014    Past Surgical History:  Procedure Laterality Date  . NO PAST SURGERIES      Allergies Depakote [valproic acid], Haldol [haloperidol lactate], Meloxicam, and Keflet [cephalexin]  Social History Social History   Tobacco Use  . Smoking status: Current Every Day Smoker    Packs/day: 0.00    Years: 20.00    Pack years: 0.00    Types: Cigarettes  . Smokeless tobacco: Never Used  . Tobacco comment: 1-2 cigarettes per day  Substance Use Topics  . Alcohol use: Yes    Comment: socially  . Drug use: No    Types: Benzodiazepines, Hydrocodone    Review of Systems Unknown, patient altered after an overdose  All systems negative/normal/unremarkable except as stated in the HPI  ____________________________________________   PHYSICAL EXAM:  VITAL SIGNS: ED Triage Vitals  Enc Vitals Group     BP      Pulse      Resp      Temp      Temp src      SpO2      Weight      Height      Head Circumference      Peak Flow      Pain Score      Pain Loc      Pain Edu?  Excl. in GC?    Constitutional: Drowsy and lethargic Eyes: Conjunctivae are normal. Normal extraocular movements. ENT      Head: Normocephalic and atraumatic.      Nose: No congestion/rhinnorhea.      Mouth/Throat: Mucous membranes are moist.      Neck: No stridor. Cardiovascular: Normal rate, regular rhythm. No murmurs, rubs, or gallops. Respiratory: Hypoventilation, clear breath sounds, occasional coughing Gastrointestinal: Soft and nontender. Normal bowel sounds Musculoskeletal: Nontender with normal range of motion in extremities. No  lower extremity tenderness nor edema. Neurologic:  Normal speech and language. No gross focal neurologic deficits are appreciated.  Skin:  Skin is warm, dry and intact. No rash noted. Psychiatric: Flat affect ____________________________________________  EKG: Interpreted by me.  Sinus tachycardia with a rate of 108 bpm, normal PR interval, normal QRS, normal QT  ____________________________________________  ED COURSE:  As part of my medical decision making, I reviewed the following data within the electronic MEDICAL RECORD NUMBER History obtained from family if available, nursing notes, old chart and ekg, as well as notes from prior ED visits. Patient presented for an overdose, we will assess with labs and imaging as indicated at this time.  Patient will need IV Narcan as she is still lethargic from her overdose.   Procedures  Sandy Mason was evaluated in Emergency Department on 03/15/2019 for the symptoms described in the history of present illness. She was evaluated in the context of the global COVID-19 pandemic, which necessitated consideration that the patient might be at risk for infection with the SARS-CoV-2 virus that causes COVID-19. Institutional protocols and algorithms that pertain to the evaluation of patients at risk for COVID-19 are in a state of rapid change based on information released by regulatory bodies including the CDC and federal and state organizations. These policies and algorithms were followed during the patient's care in the ED.  ____________________________________________   LABS (pertinent positives/negatives)  Labs Reviewed  COMPREHENSIVE METABOLIC PANEL - Abnormal; Notable for the following components:      Result Value   Sodium 134 (*)    Potassium 3.3 (*)    Chloride 94 (*)    Glucose, Bld 168 (*)    Calcium 8.4 (*)    AST 47 (*)    All other components within normal limits  GLUCOSE, CAPILLARY - Abnormal; Notable for the following components:    Glucose-Capillary 164 (*)    All other components within normal limits  CBC WITH DIFFERENTIAL/PLATELET  ETHANOL  URINALYSIS, COMPLETE (UACMP) WITH MICROSCOPIC  HCG, QUANTITATIVE, PREGNANCY  URINE DRUG SCREEN, QUALITATIVE ([facility] ONLY)  ACETAMINOPHEN LEVEL  SALICYLATE LEVEL  CBG MONITORING, ED    RADIOLOGY Images were viewed by me  Chest x-ray IMPRESSION: Low lung volumes with presumed bibasilar atelectasis. ____________________________________________   DIFFERENTIAL DIAGNOSIS   Polysubstance abuse, overdose, suicide attempt, intoxication  FINAL ASSESSMENT AND PLAN  Opioid overdose   Plan: The patient had presented for an overdose of heroin.  She was given 2 mg of IV Narcan with improvement in her mental status.  Patient's labs did not reveal any acute process. Patient's imaging was unremarkable.  After Narcan she was awake and alert and breathing normally not requiring oxygen.  She does not want a go to detox, states this was a one-time event and she was not trying to harm her self.   Ulice DashJohnathan E Stewart Sasaki, MD    Note: This note was generated in part or whole with voice recognition software. Voice recognition is usually quite accurate  but there are transcription errors that can and very often do occur. I apologize for any typographical errors that were not detected and corrected.     Earleen Newport, MD 03/15/19 2005

## 2019-03-15 NOTE — ED Notes (Signed)
Pt up to use bathroom 

## 2019-03-15 NOTE — ED Triage Notes (Signed)
Pt arrives via EMS from home after overdosing- found by roommates brother with needle still in arm- pt told EMS she only did heroin- had 2 of narcan intranasal

## 2019-03-15 NOTE — ED Notes (Signed)
Pt more alert after IV narcan- pt states she tried heroin for the first time IV by herself

## 2019-03-15 NOTE — ED Notes (Signed)
Pt continually asks how long she has to be here- when told she would be here for at least a couple hours she asked if she could leave- Dr Jimmye Norman aware that pt would like to leave

## 2019-05-28 DIAGNOSIS — G5623 Lesion of ulnar nerve, bilateral upper limbs: Secondary | ICD-10-CM | POA: Insufficient documentation

## 2019-06-28 ENCOUNTER — Other Ambulatory Visit: Payer: Self-pay

## 2019-06-28 ENCOUNTER — Emergency Department: Payer: Medicaid Other

## 2019-06-28 ENCOUNTER — Emergency Department
Admission: EM | Admit: 2019-06-28 | Discharge: 2019-06-28 | Disposition: A | Discharge status: Home / Self Care | Payer: Medicaid Other | Attending: Emergency Medicine | Admitting: Emergency Medicine

## 2019-06-28 ENCOUNTER — Encounter: Payer: Self-pay | Admitting: Intensive Care

## 2019-06-28 DIAGNOSIS — Z20828 Contact with and (suspected) exposure to other viral communicable diseases: Secondary | ICD-10-CM | POA: Insufficient documentation

## 2019-06-28 DIAGNOSIS — Z3202 Encounter for pregnancy test, result negative: Secondary | ICD-10-CM | POA: Insufficient documentation

## 2019-06-28 DIAGNOSIS — B349 Viral infection, unspecified: Secondary | ICD-10-CM

## 2019-06-28 DIAGNOSIS — Z79899 Other long term (current) drug therapy: Secondary | ICD-10-CM | POA: Diagnosis not present

## 2019-06-28 DIAGNOSIS — R0602 Shortness of breath: Secondary | ICD-10-CM | POA: Diagnosis present

## 2019-06-28 DIAGNOSIS — F17210 Nicotine dependence, cigarettes, uncomplicated: Secondary | ICD-10-CM | POA: Diagnosis not present

## 2019-06-28 LAB — COMPREHENSIVE METABOLIC PANEL
ALT: 22 U/L (ref 0–44)
AST: 24 U/L (ref 15–41)
Albumin: 4.3 g/dL (ref 3.5–5.0)
Alkaline Phosphatase: 55 U/L (ref 38–126)
Anion gap: 16 — ABNORMAL HIGH (ref 5–15)
BUN: 11 mg/dL (ref 6–20)
CO2: 16 mmol/L — ABNORMAL LOW (ref 22–32)
Calcium: 9 mg/dL (ref 8.9–10.3)
Chloride: 109 mmol/L (ref 98–111)
Creatinine, Ser: 0.87 mg/dL (ref 0.44–1.00)
GFR calc Af Amer: >60 mL/min (ref >60)
GFR calc non Af Amer: >60 mL/min (ref >60)
Glucose, Bld: 112 mg/dL — ABNORMAL HIGH (ref 70–99)
Potassium: 3.6 mmol/L (ref 3.5–5.1)
Sodium: 141 mmol/L (ref 135–145)
Total Bilirubin: 0.5 mg/dL (ref 0.3–1.2)
Total Protein: 8.3 g/dL — ABNORMAL HIGH (ref 6.5–8.1)

## 2019-06-28 LAB — INFLUENZA PANEL BY PCR (TYPE A & B)
Influenza A By PCR: NEGATIVE
Influenza B By PCR: NEGATIVE

## 2019-06-28 LAB — CBC
HCT: 40.2 % (ref 36.0–46.0)
Hemoglobin: 13.9 g/dL (ref 12.0–15.0)
MCH: 31 pg (ref 26.0–34.0)
MCHC: 34.6 g/dL (ref 30.0–36.0)
MCV: 89.5 fL (ref 80.0–100.0)
Platelets: 395 10*3/uL (ref 150–400)
RBC: 4.49 MIL/uL (ref 3.87–5.11)
RDW: 14 % (ref 11.5–15.5)
WBC: 8.8 10*3/uL (ref 4.0–10.5)
nRBC: 0 % (ref 0.0–0.2)

## 2019-06-28 LAB — URINALYSIS, COMPLETE (UACMP) WITH MICROSCOPIC
Bacteria, UA: NONE SEEN
Bilirubin Urine: NEGATIVE
Glucose, UA: NEGATIVE mg/dL
Ketones, ur: 20 mg/dL — AB
Nitrite: NEGATIVE
Protein, ur: 100 mg/dL — AB
RBC / HPF: >50 RBC/hpf — ABNORMAL HIGH (ref 0–5)
Specific Gravity, Urine: 1.039 — ABNORMAL HIGH (ref 1.005–1.030)
pH: 5 (ref 5.0–8.0)

## 2019-06-28 LAB — BASIC METABOLIC PANEL
Anion gap: 13 (ref 5–15)
BUN: 10 mg/dL (ref 6–20)
CO2: 16 mmol/L — ABNORMAL LOW (ref 22–32)
Calcium: 8 mg/dL — ABNORMAL LOW (ref 8.9–10.3)
Chloride: 113 mmol/L — ABNORMAL HIGH (ref 98–111)
Creatinine, Ser: 0.8 mg/dL (ref 0.44–1.00)
GFR calc Af Amer: >60 mL/min (ref >60)
GFR calc non Af Amer: >60 mL/min (ref >60)
Glucose, Bld: 92 mg/dL (ref 70–99)
Potassium: 3.3 mmol/L — ABNORMAL LOW (ref 3.5–5.1)
Sodium: 142 mmol/L (ref 135–145)

## 2019-06-28 LAB — PREGNANCY, URINE: Preg Test, Ur: NEGATIVE

## 2019-06-28 LAB — LIPASE, BLOOD: Lipase: 40 U/L (ref 11–51)

## 2019-06-28 MED ORDER — SODIUM CHLORIDE 0.9 % IV BOLUS
1000.0000 mL | Freq: Once | INTRAVENOUS | Status: AC
  Administered 2019-06-28: 18:00:00 1000 mL via INTRAVENOUS

## 2019-06-28 MED ORDER — PROMETHAZINE HCL 25 MG/ML IJ SOLN
12.5000 mg | Freq: Once | INTRAMUSCULAR | Status: AC
  Administered 2019-06-28: 19:00:00 12.5 mg via INTRAVENOUS
  Filled 2019-06-28: qty 1

## 2019-06-28 MED ORDER — NITROFURANTOIN MONOHYD MACRO 100 MG PO CAPS: 100.0000 mg | ORAL_CAPSULE | Freq: Two times a day (BID) | ORAL | 0 refills | Status: AC

## 2019-06-28 MED ORDER — PROMETHAZINE HCL 12.5 MG PO TABS: 12.5000 mg | ORAL_TABLET | Freq: Four times a day (QID) | ORAL | 0 refills | Status: DC | PRN

## 2019-06-28 MED ORDER — ONDANSETRON HCL 4 MG/2ML IJ SOLN
4.0000 mg | Freq: Once | INTRAMUSCULAR | Status: AC
  Administered 2019-06-28: 18:00:00 4 mg via INTRAVENOUS
  Filled 2019-06-28: qty 2

## 2019-06-28 NOTE — ED Triage Notes (Signed)
Patient c/o SOB and emesis intermittently X1 week.

## 2019-06-28 NOTE — ED Notes (Addendum)
Lab states no covid test was sent down for patient, will collect. Lavender tube sent to lab, covid walked to lab

## 2019-06-28 NOTE — ED Provider Notes (Signed)
[facility] Emergency Department Provider Note  ____________________________________________  Time seen: Approximately 5:13 PM  I have reviewed the triage vital signs and the nursing notes.   HISTORY  Chief Complaint Shortness of Breath and Emesis   HPI Sandy Mason is a 43 y.o. female who presents to the emergency department for treatment and evaluation of vomiting and shortness of breath.  Symptoms started approximately 1 week ago.   [facility] Emergency Department Provider Note ____________________________________________   First MD Initiated Contact with Patient 06/28/19 1713     (approximate)  I have reviewed the triage vital signs and the nursing notes.   HISTORY  Chief Complaint Shortness of Breath and Emesis  HPI Sandy Mason is a 43 y.o. female who presents to the emergency department for treatment and evaluation of shortness of breath with intermittent vomiting for about 6 days.  She states that symptoms started several days ago and just have not improved.  She states that she has been able to continue working but has felt bad every day.  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  She estimates that she has vomited 5 times today.   Past Medical History:  Diagnosis Date  . Alcohol use disorder severe. 12/18/2014  . Anxiety   . Borderline personality disorder (HCC) 12/18/2014  . CVA (cerebral infarction)   . Depression   . 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 Opioid use disorder, severe, dependence (HCC) 12/20/2014  . PTSD (post-traumatic stress disorder) 03/28/2015  . 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 Seizures (HCC)   . Suicide attempt by hanging [facility])     Patient Active Problem List   Diagnosis Date Noted  . Major depressive disorder, recurrent severe without psychotic features (HCC)  04/11/2017  . Salicylate overdose 04/09/2017  . AKI (acute kidney injury) (HCC) 04/03/2016  . Suicidal behavior 01/16/2016  . Alcohol intoxication (HCC)   . Severe episode of recurrent major depressive disorder, without psychotic features (HCC)   . Malingering 12/16/2015  . Major depressive disorder, recurrent episode, moderate (HCC) 12/12/2015  . Closed pelvic fracture (HCC) 10/31/2015  . Tobacco abuse 10/31/2015  . Tobacco use disorder 03/28/2015  . PTSD (post-traumatic stress disorder) 03/28/2015  . Stimulant use disorder (cocaine) 03/28/2015  . 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 Opioid use disorder, severe, dependence (HCC) 12/20/2014  . Alcohol use disorder severe. 12/18/2014  . Borderline personality disorder (HCC) 12/18/2014    Past Surgical History:  Procedure Laterality Date  . NO PAST SURGERIES      Prior to Admission medications   Medication Sig Start Date End Date Taking? Authorizing Provider  albuterol (PROVENTIL HFA;VENTOLIN HFA) 108 (90 Base) MCG/ACT inhaler Inhale 2 puffs into the lungs every 6 (six) hours as needed for wheezing or shortness of breath. 03/19/17   Willy Eddy, MD  busPIRone (BUSPAR) 10 MG tablet Take 1 tablet (10 mg total) by mouth 3 (three) times daily. Patient not taking: Reported on 03/15/2019 10/23/18   Tommie Sams, DO  gabapentin (NEURONTIN) 300 MG capsule Take 3 capsules (900 mg total) by mouth 3 (three) times daily. Patient not taking: Reported on 03/15/2019 04/16/17   Pucilowska, Braulio Conte B, MD  hydrOXYzine (ATARAX/VISTARIL) 10 MG tablet Take 10 mg by mouth 3 (three) times daily as needed.    [provider]  levonorgestrel (MIRENA) 20 MCG/24HR IUD 1 each by Intrauterine  route once.    [provider]  mirtazapine (REMERON) 30 MG tablet Take 30 mg by mouth at bedtime.    [provider]  naloxone [facility]) nasal spray 4 mg/0.1 mL Nasal spray in case of heroin  overdose again 03/15/19   Emily Filbert, MD  prazosin (MINIPRESS) 2 MG capsule Take 1 capsule (2 mg total) by mouth at bedtime. Patient not taking: Reported on 03/15/2019 04/15/17   Pucilowska, Ellin Goodie, MD  promethazine (PHENERGAN) 12.5 MG tablet Take 1 tablet (12.5 mg total) by mouth every 6 (six) hours as needed for nausea or vomiting. 06/28/19   Andrianna Manalang, Rulon Eisenmenger B, FNP  venlafaxine XR (EFFEXOR-XR) 150 MG 24 hr capsule Take 1 capsule (150 mg total) by mouth daily with breakfast. Patient not taking: Reported on 03/15/2019 05/02/17   Rockne Menghini, MD    Allergies Depakote [valproic acid], Haldol [haloperidol lactate], Meloxicam, and Keflet [cephalexin]  Family History  Problem Relation Age of Onset  . Diabetes Father   . Dementia Mother   . Anxiety disorder Mother     Social History Social History   Tobacco Use  . Smoking status: Current Every Day Smoker    Packs/day: 0.00    Years: 20.00    Pack years: 0.00    Types: Cigarettes  . Smokeless tobacco: Never Used  . Tobacco comment: 1-2 cigarettes per day  Substance Use Topics  . Alcohol use: Never    Frequency: Never    Comment: socially  . Drug use: No    Types: Benzodiazepines, Hydrocodone    Review of Systems  Constitutional: No fever/chills. Positive for body aches. Eyes: No visual changes. ENT: No sore throat. Cardiovascular: Denies chest pain. Respiratory: Positive for shortness of breath. Gastrointestinal: No abdominal pain. Positive for nausea and vomiting.  No diarrhea.  No constipation. Genitourinary: Negative for dysuria. Musculoskeletal: Negative for back pain. Skin: Negative for rash. Neurological: Negative for headaches, focal weakness or numbness. ____________________________________________   PHYSICAL EXAM:  VITAL SIGNS: ED Triage Vitals [06/28/19 1604]  Enc Vitals Group     BP 130/70     Pulse Rate (!) 103     Resp 20     Temp 99.7 F (37.6 C)     Temp Source Oral     SpO2 97 %      Weight 210 lb (95.3 kg)     Height  (1.727 m)     Head Circumference      Peak Flow      Pain Score 0     Pain Loc      Pain Edu?      Excl. in GC?     Constitutional: Alert and oriented. Well appearing and in no acute distress. Eyes: Conjunctivae are normal. PERRL. EOMI. Head: Atraumatic. Nose: No congestion/rhinnorhea. Mouth/Throat: Mucous membranes are moist.  Oropharynx non-erythematous. Neck: No stridor.   Hematological/Lymphatic/Immunilogical: No cervical lymphadenopathy. Cardiovascular: Normal rate, regular rhythm. Grossly normal heart sounds.  Good peripheral circulation. Respiratory: Normal respiratory effort.  No retractions. Lungs CTAB. Gastrointestinal: Soft and nontender. No distention. No abdominal bruits. No CVA tenderness. Genitourinary:  Musculoskeletal: No lower extremity tenderness nor edema.  No joint effusions. Neurologic:  Normal speech and language. No gross focal neurologic deficits are appreciated. No gait instability. Skin:  Skin is warm, dry and intact. No rash noted. Psychiatric: Mood and affect are normal. Speech and behavior are normal.  ____________________________________________   LABS (all labs ordered are listed, but only abnormal results are displayed)  Labs Reviewed  COMPREHENSIVE METABOLIC PANEL - Abnormal; Notable for the following components:      Result Value   CO2 16 (*)    Glucose, Bld 112 (*)    Total Protein 8.3 (*)    Anion gap 16 (*)    All other components within normal limits  URINALYSIS, COMPLETE (UACMP) WITH MICROSCOPIC - Abnormal; Notable for the following components:   Color, Urine YELLOW (*)    APPearance CLOUDY (*)    Specific Gravity, Urine 1.039 (*)    Hgb urine dipstick MODERATE (*)    Ketones, ur 20 (*)    Protein, ur 100 (*)    Leukocytes,Ua TRACE (*)    RBC / HPF >50 (*)    All other components within normal limits  BASIC METABOLIC PANEL - Abnormal; Notable for the following components:    Potassium 3.3 (*)    Chloride 113 (*)    CO2 16 (*)    Calcium 8.0 (*)    All other components within normal limits  NOVEL CORONAVIRUS, NAA (HOSP ORDER, SEND-OUT TO REF LAB; TAT 18-24 HRS)  URINE CULTURE  LIPASE, BLOOD  CBC  INFLUENZA PANEL BY PCR (TYPE A & B)  PREGNANCY, URINE   ____________________________________________  EKG  ED ECG REPORT I, Jacek Colson, FNP-BC personally viewed and interpreted this ECG.   Date: 06/28/2019  EKG Time: 1412  Rate: 95  Rhythm: normal EKG, normal sinus rhythm  Axis: normal  Intervals:none  ST&T Change: no STEMI  ____________________________________________  RADIOLOGY  ED MD interpretation:    Chest x-ray is negative for acute findings per radiology.  Official radiology report(s): Dg Chest Portable 1 View  Result Date: 06/28/2019 CLINICAL DATA:  Shortness of breath. EXAM: PORTABLE CHEST 1 VIEW COMPARISON:  March 15, 2019 FINDINGS: The heart size and mediastinal contours are within normal limits. Both lungs are clear. The visualized skeletal structures are unremarkable. IMPRESSION: No active disease. Electronically Signed   By: Gerome Samavid  Williams III M.D   On: 06/28/2019 18:32    ____________________________________________   PROCEDURES  Procedure(s) performed (including Critical Care):  Procedures  ____________________________________________   INITIAL IMPRESSION / ASSESSMENT AND PLAN     43 year old female presenting to the emergency department for treatment and evaluation of symptoms as listed in the HPI.  Initial labs show normal CBC.  BUN and creatinine are normal.  Lipase is normal. Will give fluids, test for influenza and COVID-19. Will also give zofran.  DIFFERENTIAL DIAGNOSIS  COVID-19, Influenza, Gastroenteritis  ED COURSE  CMP did show an anion gap of 16.  Patient is not a known diabetic. She has been vomiting for the past several days.  Anion gap closed after hydration.  She will be discharged home to await  the results of her COVID-19 screening.  Influenza is negative.  She was given Phenergan and fluids which relieved her nausea.  Urinalysis shows a trace leukocytes with 21-50 white blood cells, 100 protein, 20 ketones.  The patient denies dysuria or urinary symptoms.  Urine culture will be added. Prescription for phenergan given as well as work note. ____________________________________________   FINAL CLINICAL IMPRESSION(S) / ED DIAGNOSES  Final diagnoses:  Acute viral syndrome     ED Discharge Orders         Ordered    promethazine (PHENERGAN) 12.5 MG tablet  Every 6 hours PRN     06/28/19 2022           Note:  This document was prepared using Dragon voice  recognition software and may include unintentional dictation errors.    Past Medical History:  Diagnosis Date  . Alcohol use disorder severe. 12/18/2014  . Anxiety   . Borderline personality disorder (HCC) 12/18/2014  . CVA (cerebral infarction)   . Depression   . 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 Opioid use disorder, severe, dependence (HCC) 12/20/2014  . PTSD (post-traumatic stress disorder) 03/28/2015  . 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 Seizures (HCC)   . Suicide attempt by hanging [facility])     Patient Active Problem List   Diagnosis Date Noted  . Major depressive disorder, recurrent severe without psychotic features (HCC) 04/11/2017  . Salicylate overdose 04/09/2017  . AKI (acute kidney injury) (HCC) 04/03/2016  . Suicidal behavior 01/16/2016  . Alcohol intoxication (HCC)   . Severe episode of recurrent major depressive disorder, without psychotic features (HCC)   . Malingering 12/16/2015  . Major depressive disorder, recurrent episode, moderate (HCC) 12/12/2015  . Closed pelvic fracture (HCC) 10/31/2015  . Tobacco abuse 10/31/2015  . Tobacco use disorder 03/28/2015  . PTSD (post-traumatic stress disorder)  03/28/2015  . Stimulant use disorder (cocaine) 03/28/2015  . 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 Opioid use disorder, severe, dependence (HCC) 12/20/2014  . Alcohol use disorder severe. 12/18/2014  . Borderline personality disorder (HCC) 12/18/2014    Past Surgical History:  Procedure Laterality Date  . NO PAST SURGERIES      Prior to Admission medications   Medication Sig Start Date End Date Taking? Authorizing Provider  albuterol (PROVENTIL HFA;VENTOLIN HFA) 108 (90 Base) MCG/ACT inhaler Inhale 2 puffs into the lungs every 6 (six) hours as needed for wheezing or shortness of breath. 03/19/17   Willy Eddy, MD  busPIRone (BUSPAR) 10 MG tablet Take 1 tablet (10 mg total) by mouth 3 (three) times daily. Patient not taking: Reported on 03/15/2019 10/23/18   Tommie Sams, DO  gabapentin (NEURONTIN) 300 MG capsule Take 3 capsules (900 mg total) by mouth 3 (three) times daily. Patient not taking: Reported on 03/15/2019 04/16/17   Pucilowska, Braulio Conte B, MD  hydrOXYzine (ATARAX/VISTARIL) 10 MG tablet Take 10 mg by mouth 3 (three) times daily as needed.    [provider]  levonorgestrel (MIRENA) 20 MCG/24HR IUD 1 each by Intrauterine route once.    [provider]  mirtazapine (REMERON) 30 MG tablet Take 30 mg by mouth at bedtime.    [provider]  naloxone [facility]) nasal spray 4 mg/0.1 mL Nasal spray in case of heroin overdose again 03/15/19   Emily Filbert, MD  prazosin (MINIPRESS) 2 MG capsule Take 1 capsule (2 mg total) by mouth at bedtime. Patient not taking: Reported on 03/15/2019 04/15/17   Pucilowska, Ellin Goodie, MD  promethazine (PHENERGAN) 12.5 MG tablet Take 1 tablet (12.5 mg total) by mouth every 6 (six) hours as needed for nausea or vomiting. 06/28/19   Breydan Shillingburg, Rulon Eisenmenger B, FNP  venlafaxine XR (EFFEXOR-XR) 150 MG 24 hr capsule Take 1 capsule (150 mg total) by mouth daily with breakfast. Patient not  taking: Reported on 03/15/2019 05/02/17   Rockne Menghini, MD    Allergies Depakote [valproic acid], Haldol [haloperidol lactate], Meloxicam, and Keflet [cephalexin]  Family History  Problem Relation Age of Onset  . Diabetes Father   . Dementia Mother   . Anxiety disorder Mother     Social History Social History   Tobacco Use  .  Smoking status: Current Every Day Smoker    Packs/day: 0.00    Years: 20.00    Pack years: 0.00    Types: Cigarettes  . Smokeless tobacco: Never Used  . Tobacco comment: 1-2 cigarettes per day  Substance Use Topics  . Alcohol use: Never    Frequency: Never    Comment: socially  . Drug use: No    Types: Benzodiazepines, Hydrocodone   ED Discharge Orders         Ordered    promethazine (PHENERGAN) 12.5 MG tablet  Every 6 hours PRN     06/28/19 2022           Pertinent labs & imaging results that were available during my care of the patient were reviewed by me and considered in my medical decision making (see chart for details).    If controlled substance prescribed during this visit, 12 month history viewed on the [place] prior to issuing an initial prescription for Schedule II or III opiod. ____________________________________________   FINAL CLINICAL IMPRESSION(S) / ED DIAGNOSES  Final diagnoses:  Acute viral syndrome    Note:  This document was prepared using Dragon voice recognition software and may include unintentional dictation errors.    Victorino Dike, FNP 06/28/19 2028    Lavonia Drafts, MD 06/28/19 2215

## 2019-06-28 NOTE — Discharge Instructions (Signed)
Please quarantine until your COVID-19 test results are back.  If negative, you still need to stay home until you have been symptom free for 48 hours.  If positive, you will need to quarantine for 2 weeks.

## 2019-06-28 NOTE — ED Triage Notes (Signed)
First RN Note: Pt presents to ED via POV with c/o emesis and SOB. Pt ambulatory without difficulty across the lobby. Pt able to speak in complete sentences upon arrival.

## 2019-06-29 ENCOUNTER — Telehealth: Payer: Self-pay | Admitting: *Deleted

## 2019-06-29 LAB — URINE CULTURE: Culture: <10000 — AB

## 2019-06-29 NOTE — Telephone Encounter (Signed)
Patient called for results ,still pending advised to call back. 

## 2019-07-01 LAB — NOVEL CORONAVIRUS, NAA (HOSP ORDER, SEND-OUT TO REF LAB; TAT 18-24 HRS): SARS-CoV-2, NAA: NOT DETECTED

## 2019-09-28 ENCOUNTER — Other Ambulatory Visit: Payer: Self-pay

## 2019-09-28 ENCOUNTER — Emergency Department
Admission: EM | Admit: 2019-09-28 | Discharge: 2019-09-28 | Disposition: A | Discharge status: Home / Self Care | Payer: Medicaid Other | Attending: Emergency Medicine | Admitting: Emergency Medicine

## 2019-09-28 DIAGNOSIS — F17210 Nicotine dependence, cigarettes, uncomplicated: Secondary | ICD-10-CM | POA: Insufficient documentation

## 2019-09-28 DIAGNOSIS — K2900 Acute gastritis without bleeding: Secondary | ICD-10-CM | POA: Insufficient documentation

## 2019-09-28 DIAGNOSIS — Z8711 Personal history of peptic ulcer disease: Secondary | ICD-10-CM | POA: Diagnosis not present

## 2019-09-28 DIAGNOSIS — R1013 Epigastric pain: Secondary | ICD-10-CM | POA: Diagnosis present

## 2019-09-28 DIAGNOSIS — R195 Other fecal abnormalities: Secondary | ICD-10-CM | POA: Insufficient documentation

## 2019-09-28 LAB — COMPREHENSIVE METABOLIC PANEL
ALT: 19 U/L (ref 0–44)
AST: 22 U/L (ref 15–41)
Albumin: 4.2 g/dL (ref 3.5–5.0)
Alkaline Phosphatase: 56 U/L (ref 38–126)
Anion gap: 10 (ref 5–15)
BUN: 7 mg/dL (ref 6–20)
CO2: 22 mmol/L (ref 22–32)
Calcium: 8.9 mg/dL (ref 8.9–10.3)
Chloride: 104 mmol/L (ref 98–111)
Creatinine, Ser: 0.65 mg/dL (ref 0.44–1.00)
GFR calc Af Amer: >60 mL/min (ref >60)
GFR calc non Af Amer: >60 mL/min (ref >60)
Glucose, Bld: 145 mg/dL — ABNORMAL HIGH (ref 70–99)
Potassium: 3.7 mmol/L (ref 3.5–5.1)
Sodium: 136 mmol/L (ref 135–145)
Total Bilirubin: 0.4 mg/dL (ref 0.3–1.2)
Total Protein: 7.8 g/dL (ref 6.5–8.1)

## 2019-09-28 LAB — TYPE AND SCREEN
ABO/RH(D): A POS
Antibody Screen: NEGATIVE

## 2019-09-28 LAB — CBC
HCT: 44.1 % (ref 36.0–46.0)
Hemoglobin: 14.7 g/dL (ref 12.0–15.0)
MCH: 31.2 pg (ref 26.0–34.0)
MCHC: 33.3 g/dL (ref 30.0–36.0)
MCV: 93.6 fL (ref 80.0–100.0)
Platelets: 313 10*3/uL (ref 150–400)
RBC: 4.71 MIL/uL (ref 3.87–5.11)
RDW: 11.9 % (ref 11.5–15.5)
WBC: 10.2 10*3/uL (ref 4.0–10.5)
nRBC: 0 % (ref 0.0–0.2)

## 2019-09-28 LAB — POCT PREGNANCY, URINE: Preg Test, Ur: NEGATIVE

## 2019-09-28 MED ORDER — ALUM & MAG HYDROXIDE-SIMETH 200-200-20 MG/5ML PO SUSP
30.0000 mL | Freq: Once | ORAL | Status: AC
  Administered 2019-09-28: 30 mL via ORAL
  Filled 2019-09-28: qty 30

## 2019-09-28 MED ORDER — SUCRALFATE 1 G PO TABS: 1.0000 g | ORAL_TABLET | Freq: Four times a day (QID) | ORAL | 0 refills | Status: DC

## 2019-09-28 MED ORDER — PANTOPRAZOLE SODIUM 20 MG PO TBEC: 20.0000 mg | DELAYED_RELEASE_TABLET | Freq: Every day | ORAL | 1 refills | Status: DC

## 2019-09-28 MED ORDER — LIDOCAINE VISCOUS HCL 2 % MT SOLN
15.0000 mL | Freq: Once | OROMUCOSAL | Status: AC
  Administered 2019-09-28: 15 mL via ORAL
  Filled 2019-09-28: qty 15

## 2019-09-28 NOTE — ED Triage Notes (Addendum)
Pt c/o upper abd pain for the past 3-4 days, states she passed one clot in her stool a couple of days ago and today her BM was half black tar and normal. States prior to this she had daily N/V for about 2 weeks but non since the bleeding started

## 2019-09-28 NOTE — ED Provider Notes (Addendum)
[facility] Emergency Department Provider Note   ____________________________________________    I have reviewed the triage vital signs and the nursing notes.   HISTORY  Chief Complaint GI Bleeding     HPI Sandy Mason is a 44 y.o. female with history as noted below who presents with complaints of upper abdominal pain for about 3 days now.  She does not take anything for this.  She describes as a burning pain in her epigastrium.  Occasionally some radiation to her back.  Some nausea no vomiting.  She is concerned because she thinks she saw some blood in her stool several days ago and today her bowel movement was partially "tarry ".  She states that she did have a peptic ulcer several years ago.   Past Medical History:  Diagnosis Date  . Alcohol use disorder severe. 12/18/2014  . Anxiety   . Borderline personality disorder ([place]) 12/18/2014  . CVA (cerebral infarction)   . Depression   . 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 Opioid use disorder, severe, dependence ([place]) 12/20/2014  . PTSD (post-traumatic stress disorder) 03/28/2015  . Sedative, hypnotic or anxiolytic use disorder, severe, dependence ([place]) 03/27/2015  . Seizures ([place])   . Suicide attempt by hanging [facility])     Patient Active Problem List   Diagnosis Date Noted  . Major depressive disorder, recurrent severe without psychotic features ([employer]) 04/11/2017  . Salicylate overdose 62/94/7654  . AKI (acute kidney injury) ([place]) 04/03/2016  . Suicidal behavior 01/16/2016  . Alcohol intoxication ([place])   . Severe episode of recurrent major depressive disorder, without psychotic features ([place])   . Malingering 12/16/2015  . Major depressive disorder, recurrent episode, moderate ([place]) 12/12/2015  . Closed pelvic fracture ([place]) 10/31/2015  . Tobacco abuse 10/31/2015  . Tobacco use disorder 03/28/2015  . PTSD (post-traumatic stress disorder)  03/28/2015  . Stimulant use disorder (cocaine) 03/28/2015  . Sedative, hypnotic or anxiolytic use disorder, severe, dependence ([place]) 03/27/2015  . Opioid use disorder, severe, dependence ([place]) 12/20/2014  . Alcohol use disorder severe. 12/18/2014  . Borderline personality disorder ([place]) 12/18/2014    Past Surgical History:  Procedure Laterality Date  . NO PAST SURGERIES      Prior to Admission medications   Medication Sig Start Date End Date Taking? Authorizing Provider  albuterol (PROVENTIL HFA;VENTOLIN HFA) 108 (90 Base) MCG/ACT inhaler Inhale 2 puffs into the lungs every 6 (six) hours as needed for wheezing or shortness of breath. 03/19/17   Merlyn Lot, MD  busPIRone (BUSPAR) 10 MG tablet Take 1 tablet (10 mg total) by mouth 3 (three) times daily. Patient not taking: Reported on 03/15/2019 10/23/18   Coral Spikes, DO  gabapentin (NEURONTIN) 300 MG capsule Take 3 capsules (900 mg total) by mouth 3 (three) times daily. Patient not taking: Reported on 03/15/2019 04/16/17   Pucilowska, Herma Ard B, MD  hydrOXYzine (ATARAX/VISTARIL) 10 MG tablet Take 10 mg by mouth 3 (three) times daily as needed.    [provider]  levonorgestrel (MIRENA) 20 MCG/24HR IUD 1 each by Intrauterine route once.    [provider]  mirtazapine (REMERON) 30 MG tablet Take 30 mg by mouth at bedtime.    [provider]  naloxone [facility]) nasal spray 4 mg/0.1 mL Nasal spray in case of heroin overdose again 03/15/19   Earleen Newport, MD  pantoprazole (PROTONIX) 20 MG tablet Take 1 tablet (20 mg total) by mouth daily. 09/28/19 09/27/20  Corky Downs,  Molly Maduro, MD  prazosin (MINIPRESS) 2 MG capsule Take 1 capsule (2 mg total) by mouth at bedtime. Patient not taking: Reported on 03/15/2019 04/15/17   Pucilowska, Ellin Goodie, MD  promethazine (PHENERGAN) 12.5 MG tablet Take 1 tablet (12.5 mg total) by mouth every 6 (six) hours as needed for nausea or vomiting. 06/28/19   Triplett, Rulon Eisenmenger B, FNP  sucralfate  (CARAFATE) 1 g tablet Take 1 tablet (1 g total) by mouth 4 (four) times daily for 15 days. 09/28/19 10/13/19  Jene Every, MD  venlafaxine XR (EFFEXOR-XR) 150 MG 24 hr capsule Take 1 capsule (150 mg total) by mouth daily with breakfast. Patient not taking: Reported on 03/15/2019 05/02/17   Rockne Menghini, MD     Allergies Depakote [valproic acid], Haldol [haloperidol lactate], Meloxicam, and Keflet [cephalexin]  Family History  Problem Relation Age of Onset  . Diabetes Father   . Dementia Mother   . Anxiety disorder Mother     Social History Social History   Tobacco Use  . Smoking status: Current Every Day Smoker    Packs/day: 0.00    Years: 20.00    Pack years: 0.00    Types: Cigarettes  . Smokeless tobacco: Never Used  . Tobacco comment: 1-2 cigarettes per day  Substance Use Topics  . Alcohol use: Never    Comment: socially  . Drug use: No    Types: Benzodiazepines, Hydrocodone    Review of Systems  Constitutional: No fever/chills Eyes: No visual changes.  ENT: No sore throat. Cardiovascular: Denies chest pain. Respiratory: Denies shortness of breath. Gastrointestinal: As above Genitourinary: Negative for dysuria. Musculoskeletal: Negative for back pain. Skin: Negative for rash. Neurological: Negative for headaches    ____________________________________________   PHYSICAL EXAM:  VITAL SIGNS: ED Triage Vitals  Enc Vitals Group     BP 09/28/19 1430 (!) 138/92     Pulse Rate 09/28/19 1428 77     Resp 09/28/19 1428 18     Temp 09/28/19 1428 98.2 F (36.8 C)     Temp Source 09/28/19 1428 Oral     SpO2 09/28/19 1428 98 %     Weight 09/28/19 1430 99.8 kg (220 lb)     Height 09/28/19 1430 1.727 m (5\' 8" )     Head Circumference --      Peak Flow --      Pain Score 09/28/19 1430 10     Pain Loc --      Pain Edu? --      Excl. in GC? --     Constitutional: Alert and oriented. Eyes: Conjunctivae are normal.  Head: Atraumatic. Nose: No  congestion/rhinnorhea.  Cardiovascular: Normal rate, regular rhythm.  Good peripheral circulation. Respiratory: Normal respiratory effort.  No retractions Gastrointestinal: Mild tenderness over the epigastrium. No distention.  No CVA tenderness.  Guaiac negative brown stool Musculoskeletal: No lower extremity tenderness nor edema.  Warm and well perfused Neurologic:  Normal speech and language. No gross focal neurologic deficits are appreciated.  Skin:  Skin is warm, dry and intact. No rash noted. Psychiatric: Mood and affect are normal. Speech and behavior are normal.  ____________________________________________   LABS (all labs ordered are listed, but only abnormal results are displayed)  Labs Reviewed  COMPREHENSIVE METABOLIC PANEL - Abnormal; Notable for the following components:      Result Value   Glucose, Bld 145 (*)    All other components within normal limits  CBC  POC URINE PREG, ED  POCT PREGNANCY, URINE  POC OCCULT  BLOOD, ED  TYPE AND SCREEN   ____________________________________________  EKG  None ____________________________________________  RADIOLOGY  None ____________________________________________   PROCEDURES  Procedure(s) performed: No  Procedures   Critical Care performed: No ____________________________________________   INITIAL IMPRESSION / ASSESSMENT AND PLAN / ED COURSE  Pertinent labs & imaging results that were available during my care of the patient were reviewed by me and considered in my medical decision making (see chart for details).  Patient presents with epigastric discomfort burning in nature suspicious for gastritis versus PUD.  Abdominal exam is overall reassuring, lab work demonstrates normal LFTs.  Denies recent alcohol use.  Does smoke cigarettes.  Treated with GI cocktail with significant improvement in symptoms suggestive of gastritis  Will Rx Carafate and Protonix, recommend close follow-up with GI, return  precautions discussed    ____________________________________________   FINAL CLINICAL IMPRESSION(S) / ED DIAGNOSES  Final diagnoses:  Acute gastritis without hemorrhage, unspecified gastritis type        Note:  This document was prepared using Dragon voice recognition software and may include unintentional dictation errors.   Jene Every, MD 09/28/19 Derek Jack    Jene Every, MD 09/28/19 2053

## 2019-09-28 NOTE — ED Notes (Signed)
Urine sent to the lab

## 2019-12-29 ENCOUNTER — Other Ambulatory Visit: Payer: Self-pay

## 2019-12-29 ENCOUNTER — Inpatient Hospital Stay
Admission: EM | Admit: 2019-12-29 | Disposition: A | Discharge status: Still In Hospital | Payer: Medicaid Other | Source: Home / Self Care | Attending: Psychiatry | Admitting: Psychiatry

## 2019-12-29 ENCOUNTER — Emergency Department
Admission: EM | Admit: 2019-12-29 | Discharge: 2019-12-29 | Disposition: A | Discharge status: Home / Self Care | Payer: Medicaid Other | Attending: Emergency Medicine | Admitting: Emergency Medicine

## 2019-12-29 DIAGNOSIS — F10220 Alcohol dependence with intoxication, uncomplicated: Secondary | ICD-10-CM | POA: Diagnosis not present

## 2019-12-29 DIAGNOSIS — F419 Anxiety disorder, unspecified: Secondary | ICD-10-CM | POA: Diagnosis present

## 2019-12-29 DIAGNOSIS — F609 Personality disorder, unspecified: Secondary | ICD-10-CM | POA: Diagnosis not present

## 2019-12-29 DIAGNOSIS — Y929 Unspecified place or not applicable: Secondary | ICD-10-CM | POA: Diagnosis not present

## 2019-12-29 DIAGNOSIS — F411 Generalized anxiety disorder: Secondary | ICD-10-CM | POA: Insufficient documentation

## 2019-12-29 DIAGNOSIS — Y9389 Activity, other specified: Secondary | ICD-10-CM | POA: Diagnosis not present

## 2019-12-29 DIAGNOSIS — Y999 Unspecified external cause status: Secondary | ICD-10-CM | POA: Insufficient documentation

## 2019-12-29 DIAGNOSIS — E876 Hypokalemia: Secondary | ICD-10-CM | POA: Diagnosis not present

## 2019-12-29 DIAGNOSIS — Y906 Blood alcohol level of 120-199 mg/100 ml: Secondary | ICD-10-CM | POA: Diagnosis not present

## 2019-12-29 DIAGNOSIS — F17210 Nicotine dependence, cigarettes, uncomplicated: Secondary | ICD-10-CM | POA: Insufficient documentation

## 2019-12-29 DIAGNOSIS — Z79899 Other long term (current) drug therapy: Secondary | ICD-10-CM | POA: Insufficient documentation

## 2019-12-29 DIAGNOSIS — F3161 Bipolar disorder, current episode mixed, mild: Secondary | ICD-10-CM | POA: Insufficient documentation

## 2019-12-29 DIAGNOSIS — F3163 Bipolar disorder, current episode mixed, severe, without psychotic features: Secondary | ICD-10-CM | POA: Diagnosis present

## 2019-12-29 DIAGNOSIS — Z20822 Contact with and (suspected) exposure to covid-19: Secondary | ICD-10-CM | POA: Insufficient documentation

## 2019-12-29 DIAGNOSIS — F4310 Post-traumatic stress disorder, unspecified: Secondary | ICD-10-CM | POA: Diagnosis not present

## 2019-12-29 LAB — COMPREHENSIVE METABOLIC PANEL
ALT: 16 U/L (ref 0–44)
AST: 18 U/L (ref 15–41)
Albumin: 4.2 g/dL (ref 3.5–5.0)
Alkaline Phosphatase: 64 U/L (ref 38–126)
Anion gap: 13 (ref 5–15)
BUN: 6 mg/dL (ref 6–20)
CO2: 21 mmol/L — ABNORMAL LOW (ref 22–32)
Calcium: 8.8 mg/dL — ABNORMAL LOW (ref 8.9–10.3)
Chloride: 104 mmol/L (ref 98–111)
Creatinine, Ser: 0.67 mg/dL (ref 0.44–1.00)
GFR calc Af Amer: >60 mL/min (ref >60)
GFR calc non Af Amer: >60 mL/min (ref >60)
Glucose, Bld: 157 mg/dL — ABNORMAL HIGH (ref 70–99)
Potassium: 2.9 mmol/L — ABNORMAL LOW (ref 3.5–5.1)
Sodium: 138 mmol/L (ref 135–145)
Total Bilirubin: 0.5 mg/dL (ref 0.3–1.2)
Total Protein: 8.2 g/dL — ABNORMAL HIGH (ref 6.5–8.1)

## 2019-12-29 LAB — ACETAMINOPHEN LEVEL: Acetaminophen (Tylenol), Serum: <10 ug/mL — ABNORMAL LOW (ref 10–30)

## 2019-12-29 LAB — CBC
HCT: 42.8 % (ref 36.0–46.0)
Hemoglobin: 14.4 g/dL (ref 12.0–15.0)
MCH: 31.9 pg (ref 26.0–34.0)
MCHC: 33.6 g/dL (ref 30.0–36.0)
MCV: 94.7 fL (ref 80.0–100.0)
Platelets: 293 10*3/uL (ref 150–400)
RBC: 4.52 MIL/uL (ref 3.87–5.11)
RDW: 12.7 % (ref 11.5–15.5)
WBC: 13.4 10*3/uL — ABNORMAL HIGH (ref 4.0–10.5)
nRBC: 0 % (ref 0.0–0.2)

## 2019-12-29 LAB — ETHANOL: Alcohol, Ethyl (B): 160 mg/dL — ABNORMAL HIGH (ref <10)

## 2019-12-29 LAB — SARS CORONAVIRUS 2 BY RT PCR (HOSPITAL ORDER, PERFORMED IN ~~LOC~~ HOSPITAL LAB): SARS Coronavirus 2: NEGATIVE

## 2019-12-29 LAB — SALICYLATE LEVEL: Salicylate Lvl: 7.3 mg/dL (ref 7.0–30.0)

## 2019-12-29 MED ORDER — PANTOPRAZOLE SODIUM 20 MG PO TBEC
20.0000 mg | DELAYED_RELEASE_TABLET | Freq: Every day | ORAL | Status: DC
  Filled 2019-12-29 (×2): qty 1

## 2019-12-29 MED ORDER — ARIPIPRAZOLE 2 MG PO TABS: 2.0000 mg | ORAL_TABLET | Freq: Every day | ORAL | Status: DC

## 2019-12-29 MED ORDER — BUSPIRONE HCL 5 MG PO TABS
5.0000 mg | ORAL_TABLET | Freq: Three times a day (TID) | ORAL | Status: DC
  Filled 2019-12-29 (×2): qty 1

## 2019-12-29 MED ORDER — POTASSIUM CHLORIDE 20 MEQ/15ML (10%) PO SOLN
40.0000 meq | Freq: Once | ORAL | Status: AC
  Administered 2019-12-29: 40 meq via ORAL
  Filled 2019-12-29: qty 30

## 2019-12-29 MED ORDER — SUCRALFATE 1 G PO TABS
1.0000 g | ORAL_TABLET | Freq: Four times a day (QID) | ORAL | Status: DC
  Administered 2019-12-29: 1 g via ORAL
  Filled 2019-12-29: qty 1

## 2019-12-29 MED ORDER — MIRTAZAPINE 15 MG PO TABS
30.0000 mg | ORAL_TABLET | Freq: Every day | ORAL | Status: DC
  Administered 2019-12-29: 30 mg via ORAL
  Filled 2019-12-29: qty 2

## 2019-12-29 MED ORDER — MIRTAZAPINE 15 MG PO TBDP
45.0000 mg | ORAL_TABLET | Freq: Every day | ORAL | Status: DC
  Filled 2019-12-29: qty 3

## 2019-12-29 MED ORDER — BREXPIPRAZOLE 2 MG PO TABS
2.0000 mg | ORAL_TABLET | Freq: Every day | ORAL | Status: DC
  Filled 2019-12-29 (×2): qty 1

## 2019-12-29 MED ORDER — LORAZEPAM 2 MG PO TABS
2.0000 mg | ORAL_TABLET | Freq: Once | ORAL | Status: AC
  Administered 2019-12-29: 2 mg via ORAL
  Filled 2019-12-29: qty 1

## 2019-12-29 MED ORDER — PRAZOSIN HCL 2 MG PO CAPS
2.0000 mg | ORAL_CAPSULE | Freq: Every day | ORAL | Status: DC
  Filled 2019-12-29: qty 1

## 2019-12-29 MED ORDER — HYDROXYZINE HCL 25 MG PO TABS: 25.0000 mg | ORAL_TABLET | Freq: Two times a day (BID) | ORAL | Status: DC | PRN

## 2019-12-29 MED ORDER — CLONAZEPAM 0.5 MG PO TABS
0.5000 mg | ORAL_TABLET | Freq: Two times a day (BID) | ORAL | Status: DC
  Administered 2019-12-29: 0.5 mg via ORAL
  Filled 2019-12-29: qty 1

## 2019-12-29 MED ORDER — VENLAFAXINE HCL ER 75 MG PO CP24: 150.0000 mg | ORAL_CAPSULE | Freq: Every morning | ORAL | Status: DC

## 2019-12-29 MED ORDER — GABAPENTIN 300 MG PO CAPS
900.0000 mg | ORAL_CAPSULE | Freq: Three times a day (TID) | ORAL | Status: DC
  Administered 2019-12-29 (×2): 900 mg via ORAL
  Filled 2019-12-29 (×2): qty 3

## 2019-12-29 MED ORDER — HYDROXYZINE HCL 10 MG PO TABS
10.0000 mg | ORAL_TABLET | Freq: Three times a day (TID) | ORAL | Status: DC
  Filled 2019-12-29 (×3): qty 1
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
## 2019-12-29 NOTE — ED Notes (Signed)
Report provided to BMU. Pt in route with EDT x2. Pt in personal clothing with personal belongings bags.
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
## 2019-12-29 NOTE — ED Notes (Signed)
Pt given meal tray.

## 2019-12-29 NOTE — ED Notes (Signed)
Per Dr Smith Robert note, awaiting voluntary bed or other hospital admit

## 2019-12-29 NOTE — BH Assessment (Signed)
Patient is to be admitted to [facility] by Dr. Smith Robert.  Attending Physician will be Dr. Toni Amend.   Patient has been assigned to room 311, by [facility] Charge Nurse Aurther Loft.   Intake Paper Work has been signed and placed on patient chart.  ER staff is aware of the admission:  Drinda Butts, ER Secretary    Dr. Marisa Severin, ER MD   Nicole Cella, Patient Access.

## 2019-12-29 NOTE — ED Triage Notes (Addendum)
Pt states she is here for mental health issues, hx of major depressive disorder. Pt states "I am not having any thoughts of harming my self or others, I just feel like I am loosing control of my life". States she is just having some stressors she is not able to control. Pt states she is currently homeless and the only bed she can find is at a center on the other side of [place] and she works here locally and doesn't know what to do. States she has been having some demostic issues.

## 2019-12-29 NOTE — ED Notes (Signed)
Called no answer

## 2019-12-29 NOTE — ED Notes (Signed)
Call from [facility] reports that pt does not have admission orders from admitting MD. Pt brought back to ED due to this issue.
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
## 2019-12-29 NOTE — ED Notes (Signed)
Called Pharmacy and per Trinna Post they will send up medication.

## 2019-12-29 NOTE — ED Notes (Signed)
Pharmacy to come and speak with pt to verify medications
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
## 2019-12-30 ENCOUNTER — Emergency Department
Admission: EM | Admit: 2019-12-30 | Discharge: 2019-12-30 | Disposition: A | Discharge status: Home / Self Care | Payer: Medicaid Other | Source: Home / Self Care

## 2019-12-30 DIAGNOSIS — F439 Reaction to severe stress, unspecified: Secondary | ICD-10-CM

## 2019-12-30 DIAGNOSIS — F419 Anxiety disorder, unspecified: Secondary | ICD-10-CM

## 2019-12-30 MED ORDER — SUCRALFATE 1 G PO TABS
1.0000 g | ORAL_TABLET | Freq: Four times a day (QID) | ORAL | Status: DC
  Administered 2019-12-30 (×2): 1 g via ORAL
  Filled 2019-12-30 (×2): qty 1

## 2019-12-30 MED ORDER — GABAPENTIN 300 MG PO CAPS
900.0000 mg | ORAL_CAPSULE | Freq: Three times a day (TID) | ORAL | Status: DC
  Administered 2019-12-30 (×2): 900 mg via ORAL
  Filled 2019-12-30 (×2): qty 3

## 2019-12-30 MED ORDER — ACETAMINOPHEN 500 MG PO TABS
1000.0000 mg | ORAL_TABLET | ORAL | Status: AC
  Administered 2019-12-30: 1000 mg via ORAL
  Filled 2019-12-30: qty 2

## 2019-12-30 MED ORDER — PANTOPRAZOLE SODIUM 20 MG PO TBEC
20.0000 mg | DELAYED_RELEASE_TABLET | Freq: Every day | ORAL | Status: DC
  Administered 2019-12-30: 20 mg via ORAL
  Filled 2019-12-30 (×2): qty 1

## 2019-12-30 MED ORDER — NICOTINE 21 MG/24HR TD PT24
21.0000 mg | MEDICATED_PATCH | Freq: Once | TRANSDERMAL | Status: DC
  Administered 2019-12-30: 21 mg via TRANSDERMAL
  Filled 2019-12-30: qty 1

## 2019-12-30 MED ORDER — BREXPIPRAZOLE 2 MG PO TABS
2.0000 mg | ORAL_TABLET | Freq: Every day | ORAL | Status: DC
  Filled 2019-12-30 (×3): qty 1

## 2019-12-30 MED ORDER — CLONAZEPAM 0.5 MG PO TABS
1.0000 mg | ORAL_TABLET | ORAL | Status: AC
  Administered 2019-12-30: 1 mg via ORAL
  Filled 2019-12-30: qty 2

## 2019-12-30 MED ORDER — VENLAFAXINE HCL ER 150 MG PO CP24
150.0000 mg | ORAL_CAPSULE | ORAL | Status: AC
  Administered 2019-12-30: 150 mg via ORAL
  Filled 2019-12-30: qty 1

## 2019-12-30 MED ORDER — HYDROXYZINE HCL 25 MG PO TABS
25.0000 mg | ORAL_TABLET | Freq: Two times a day (BID) | ORAL | Status: DC | PRN
  Filled 2019-12-30: qty 1

## 2019-12-30 MED ORDER — ACETAMINOPHEN 325 MG PO TABS
650.0000 mg | ORAL_TABLET | Freq: Once | ORAL | Status: AC
  Administered 2019-12-30: 650 mg via ORAL
  Filled 2019-12-30: qty 2

## 2019-12-30 NOTE — ED Notes (Signed)
Pt given breakfast tray

## 2019-12-30 NOTE — ED Notes (Signed)
Pharmacy stating pt was authorized for one time dose of Klonopin and was d/c by psychiatrist. MD made aware.

## 2019-12-30 NOTE — Discharge Instructions (Addendum)
Follow-up with some of the resources provided to you. Continue taking your medications as prescribed.  Return to the ER for any new or recurrent thoughts of suicide, any other thoughts of wanting to hurt yourself or anyone else, or any other new or worsening symptoms that concern you.

## 2019-12-30 NOTE — ED Notes (Signed)
Pt given cereal, milk and a spoon.

## 2019-12-30 NOTE — ED Notes (Signed)
Pt refusing hydroxyzine. Pt stating she does not like how it makes her feel. Pt still wanting to take tylenol at this time

## 2019-12-30 NOTE — ED Notes (Addendum)
After returning from a different pt room pt no longer seen on stretcher. Discharge papers no longer on MD desk. Pt did not sign discharge on computer but did verbalize understanding of discharge with MD. This nurse believes pt misunderstood that she needed to wait for signature. Pt belongings already with pt.

## 2019-12-30 NOTE — ED Notes (Signed)
Called [facility] to check on status of bed. Per Charge nurse Aurther Loft, they are at capacity based on acuity and will not be accepting any patients today unless approved by Dr. Toni Amend. ED charge RN made aware.

## 2019-12-30 NOTE — ED Notes (Signed)
Pt requesting tylenol and klonopin. Pt stating she takes 1mg  klonopin x2 daily. This RN asked pharmacy to verify meds. MD made aware.

## 2019-12-30 NOTE — ED Notes (Signed)
Pt re-registered due to issue with admission into [facility] post discharge from ED. Chart to me merged due to same encounter. Paper chart was utilized from 2300 6/1 until now.   Pt is resting comfortably in bed with normal respirations. Bed at lowest position for safety.

## 2019-12-30 NOTE — ED Notes (Signed)
Patient sitting cross-legged on bed in hallway. Attempted to get up to talk to nurse and her foot got caught in the sheet. Patient was unable to get her footing and fell on her left side. Patient did not hit head and was able to get up without difficulty. Patient complaining of pain left hip, but able to ambulate and bear weight without difficulty. EDP assessed patient prior to her getting up from floor and again once she was positioned in back in bed. Patient requesting tylenol for pain in hip. No other complaints at this time. Charge RN aware. Patient remains in low bed in hallway. Given non-slip socks to wear when her shoes are off.

## 2019-12-30 NOTE — ED Notes (Signed)
Pt states she is wanting to go home. She feels like sitting in the hallway is counterproductive and she is tired of on-line shopping.

## 2019-12-30 NOTE — ED Notes (Signed)
behavioral intake at the bedside to discuss possibility of discharge.

## 2019-12-30 NOTE — ED Notes (Signed)
Pt was given dinner food tray.

## 2019-12-30 NOTE — ED Provider Notes (Signed)
Patient stood up from her bed about 1 hour ago, at which point she got her foot caught in her bedding.  She fell onto her left hip.  She denies any significant injury just a little bit of soreness.  Was able to get up stand ambulate without difficulty.  No noted evidence of injury other than some tenderness over the left lateral buttock region she reports, but denies any significant injury.   Sharyn Creamer, MD 12/30/19 [phone]

## 2019-12-30 NOTE — ED Notes (Signed)
ED MD at the bedside to discuss plan of care with pt.

## 2019-12-30 NOTE — ED Provider Notes (Signed)
Patient understandably frustrated by the wait time to find her an admit bed, though understands the reasoning for the long wait time due to limited bed availability. She is requesting discharge. She states that since being here she feels improved, feels like her stress is better managed than prior to arrival, but is worried with the commotion of other psych patients that this may act as a new stressor. She has been able to sleep more, which is an improvement. She has been compliant w/ her medications here (prior to ED visit she had issues with obtaining her medications due to availability and changing housing, which has been resolved). She feels improved, comfortable, and safe going home. She has access to her medications. She denies SI or HI and demonstrates clear and linear thought process. Updated TTS, who have provided her a list of outpatient resources. Advised she follow up with the resources, she voices understanding. Patient does not demonstrate an acute danger to herself or others. As such, feel she is otherwise reasonable and stable for discharge w/ close outpatient follow up.  Given return precautions.    Miguel Aschoff., MD 12/31/19 [phone]

## 2020-01-01 NOTE — Discharge Summary (Signed)
This note is to fill in the requirement for a discharge summary for this patient in the computer.  As I mentioned in my history and physical the patient was seen in the emergency room.  She was admitted in the computer to the psychiatry unit but as far as I know was never actually assigned a bed physically or physically seen on the ward.  She was taken back to the emergency room because of staffing issues and acuity issues on the psychiatry ward.  From what I see in the chart she stayed in the emergency room for some short period of time and then ultimately was released.  At no time was the patient seen by my self or anyone else as far as I know on the inpatient psychiatry ward.  All treatment was performed in the emergency room.

## 2020-01-01 NOTE — H&P (Signed)
This is a note to fill in the requirement for a history and physical.  This 44 year old woman was seen in the emergency room of [facility].  She was not seen by me.  Arrangements were initially made for her to be admitted to the inpatient psychiatric service.  In fact orders were even placed.  It is my understanding that she was brought down to the unit but was not actually brought into the unit or placed in a bed and was taken back to the emergency room.  At no time did I ever meet or interact with the patient in any way.  No physical performed by me.  Patient was not really ever admitted in any real sense to psychiatry but was treated in the emergency room and from what I see in the chart ultimately released.

## 2020-02-03 ENCOUNTER — Encounter: Payer: Self-pay | Admitting: Emergency Medicine

## 2020-02-03 ENCOUNTER — Other Ambulatory Visit: Payer: Self-pay

## 2020-02-03 ENCOUNTER — Emergency Department
Admission: EM | Admit: 2020-02-03 | Discharge: 2020-02-04 | Disposition: A | Discharge status: Home / Self Care | Payer: Medicaid Other | Attending: Emergency Medicine | Admitting: Emergency Medicine

## 2020-02-03 DIAGNOSIS — T43011A Poisoning by tricyclic antidepressants, accidental (unintentional), initial encounter: Secondary | ICD-10-CM | POA: Insufficient documentation

## 2020-02-03 DIAGNOSIS — T50901A Poisoning by unspecified drugs, medicaments and biological substances, accidental (unintentional), initial encounter: Secondary | ICD-10-CM

## 2020-02-03 DIAGNOSIS — Z79899 Other long term (current) drug therapy: Secondary | ICD-10-CM | POA: Insufficient documentation

## 2020-02-03 DIAGNOSIS — F17210 Nicotine dependence, cigarettes, uncomplicated: Secondary | ICD-10-CM | POA: Diagnosis not present

## 2020-02-03 DIAGNOSIS — F419 Anxiety disorder, unspecified: Secondary | ICD-10-CM | POA: Diagnosis present

## 2020-02-03 LAB — CBC WITH DIFFERENTIAL/PLATELET
Abs Immature Granulocytes: 0.03 10*3/uL (ref 0.00–0.07)
Basophils Absolute: 0.1 10*3/uL (ref 0.0–0.1)
Basophils Relative: 1 %
Eosinophils Absolute: 0.1 10*3/uL (ref 0.0–0.5)
Eosinophils Relative: 1 %
HCT: 35.3 % — ABNORMAL LOW (ref 36.0–46.0)
Hemoglobin: 12.3 g/dL (ref 12.0–15.0)
Immature Granulocytes: 0 %
Lymphocytes Relative: 47 %
Lymphs Abs: 3.8 10*3/uL (ref 0.7–4.0)
MCH: 31.8 pg (ref 26.0–34.0)
MCHC: 34.8 g/dL (ref 30.0–36.0)
MCV: 91.2 fL (ref 80.0–100.0)
Monocytes Absolute: 0.7 10*3/uL (ref 0.1–1.0)
Monocytes Relative: 8 %
Neutro Abs: 3.5 10*3/uL (ref 1.7–7.7)
Neutrophils Relative %: 43 %
Platelets: 255 10*3/uL (ref 150–400)
RBC: 3.87 MIL/uL (ref 3.87–5.11)
RDW: 12.6 % (ref 11.5–15.5)
WBC: 8.2 10*3/uL (ref 4.0–10.5)
nRBC: 0 % (ref 0.0–0.2)

## 2020-02-03 LAB — BASIC METABOLIC PANEL
Anion gap: 8 (ref 5–15)
BUN: 7 mg/dL (ref 6–20)
CO2: 27 mmol/L (ref 22–32)
Calcium: 8.1 mg/dL — ABNORMAL LOW (ref 8.9–10.3)
Chloride: 105 mmol/L (ref 98–111)
Creatinine, Ser: 0.52 mg/dL (ref 0.44–1.00)
GFR calc Af Amer: >60 mL/min (ref >60)
GFR calc non Af Amer: >60 mL/min (ref >60)
Glucose, Bld: 84 mg/dL (ref 70–99)
Potassium: 3.4 mmol/L — ABNORMAL LOW (ref 3.5–5.1)
Sodium: 140 mmol/L (ref 135–145)

## 2020-02-03 LAB — ACETAMINOPHEN LEVEL: Acetaminophen (Tylenol), Serum: <10 ug/mL — ABNORMAL LOW (ref 10–30)

## 2020-02-03 LAB — SALICYLATE LEVEL: Salicylate Lvl: <7 mg/dL — ABNORMAL LOW (ref 7.0–30.0)

## 2020-02-03 MED ORDER — LORAZEPAM 1 MG PO TABS
1.0000 mg | ORAL_TABLET | Freq: Once | ORAL | Status: AC
  Administered 2020-02-03: 1 mg via ORAL
  Filled 2020-02-03: qty 1

## 2020-02-03 NOTE — ED Triage Notes (Signed)
Pt reports missed some doses of her medication and took multiple doses at a time. Medication effexor. Pt took 3 doses yesterday, 300 mg each. Pt reports has been feeling anxious since then

## 2020-02-03 NOTE — ED Notes (Signed)
Pt not in room.

## 2020-02-03 NOTE — ED Triage Notes (Signed)
Pt reports took meds at 9:62m yesterday morning and started feeling this way 3 hours ago. Pt denies pain just reports feels anxious

## 2020-02-03 NOTE — ED Provider Notes (Signed)
[facility] Emergency Department Provider Note ____________________________________________  Time seen: 2205  I have reviewed the triage vital signs and the nursing notes.  HISTORY  Chief Complaint  Drug Overdose  HPI Sandy Mason is a 44 y.o. female presents to the ED after she took 3 tabs of her Effexor p.o., intentionally, after she missed a dose the day before.  Patient presents after she took 3 tabs of her 300 mg Effexor at 9 am yesterday morning. She reports onset of anxiety and nervousness about 3 hours prior to arrival today. She denies chest pain, SOB, N/V, or weakness. She denies intentionally taking the overdose to harm herself, she reports she had missed the dose the prior day, and was trying to catch up.  She has been on this Effexor for the last several years, and reports that she is her regular refills from her primary care provider.  Past Medical History:  Diagnosis Date  . Alcohol use disorder severe. 12/18/2014  . Anxiety   . Borderline personality disorder (HCC) 12/18/2014  . CVA (cerebral infarction)   . Depression   . 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 Opioid use disorder, severe, dependence (HCC) 12/20/2014  . PTSD (post-traumatic stress disorder) 03/28/2015  . 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 Seizures (HCC)   . Suicide attempt by hanging [facility])     Patient Active Problem List   Diagnosis Date Noted  . Major depressive disorder, recurrent severe without psychotic features (HCC) 04/11/2017  . Salicylate overdose 04/09/2017  . AKI (acute kidney injury) (HCC) 04/03/2016  . Suicidal behavior 01/16/2016  . Alcohol intoxication (HCC)   . Severe episode of recurrent major depressive disorder, without psychotic features (HCC)   . Malingering 12/16/2015  . Major depressive disorder, recurrent episode, moderate (HCC) 12/12/2015  . Closed  pelvic fracture (HCC) 10/31/2015  . Tobacco abuse 10/31/2015  . Tobacco use disorder 03/28/2015  . PTSD (post-traumatic stress disorder) 03/28/2015  . Stimulant use disorder (cocaine) 03/28/2015  . 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 Opioid use disorder, severe, dependence (HCC) 12/20/2014  . Alcohol use disorder severe. 12/18/2014  . Borderline personality disorder (HCC) 12/18/2014    Past Surgical History:  Procedure Laterality Date  . NO PAST SURGERIES      Prior to Admission medications   Medication Sig Start Date End Date Taking? Authorizing Provider  acetaminophen (TYLENOL) 325 MG tablet Take by mouth every 6 (six) hours as needed.  02/20/13   [provider]  gabapentin (NEURONTIN) 300 MG capsule Take 900 mg by mouth 3 (three) times daily.    [provider]  hydrOXYzine (ATARAX/VISTARIL) 25 MG tablet Take 25 mg by mouth 2 (two) times daily as needed. 11/23/19   [provider]  levonorgestrel (MIRENA) 20 MCG/24HR IUD 1 each by Intrauterine route once.    [provider]  mirtazapine (REMERON) 30 MG tablet Take 30 mg by mouth at bedtime.    [provider]  naloxone [facility]) nasal spray 4 mg/0.1 mL Nasal spray in case of heroin overdose again 03/15/19   Emily Filbert, MD  pantoprazole (PROTONIX) 20 MG tablet Take 1 tablet (20 mg total) by mouth daily. 09/28/19 09/27/20  Jene Every, MD  REXULTI 2 MG TABS tablet Take 2 mg by mouth daily. 11/24/19   [provider]  sucralfate (CARAFATE) 1 g tablet Take 1 tablet (1 g total) by mouth  4 (four) times daily for 15 days. 09/28/19 12/29/19  Jene Every, MD  venlafaxine XR (EFFEXOR-XR) 150 MG 24 hr capsule Take 300 mg by mouth daily with breakfast.     [provider]    Allergies Depakote [valproic acid], Haldol [haloperidol lactate], Buspar [buspirone], Meloxicam, and Keflet [cephalexin]  Family History  Problem Relation  Age of Onset  . Diabetes Father   . Dementia Mother   . Anxiety disorder Mother     Social History Social History   Tobacco Use  . Smoking status: Current Every Day Smoker    Packs/day: 0.00    Years: 20.00    Pack years: 0.00    Types: Cigarettes  . Smokeless tobacco: Never Used  . Tobacco comment: 1-2 cigarettes per day  Vaping Use  . Vaping Use: Never used  Substance Use Topics  . Alcohol use: Never    Comment: socially  . Drug use: No    Types: Benzodiazepines, Hydrocodone    Review of Systems  Constitutional: Negative for fever. Cardiovascular: Negative for chest pain. Respiratory: Negative for shortness of breath. Gastrointestinal: Negative for abdominal pain, vomiting and diarrhea. Musculoskeletal: Negative for back pain. Skin: Negative for rash. Neurological: Negative for headaches, focal weakness or numbness. Psychological: Patient denies suicidal or homicidal ideations. ____________________________________________  PHYSICAL EXAM:  VITAL SIGNS: ED Triage Vitals  Enc Vitals Group     BP 02/03/20 1848 (!) 92/58     Pulse Rate 02/03/20 1848 69     Resp 02/03/20 1848 20     Temp 02/03/20 1848 99.1 F (37.3 C)     Temp Source 02/03/20 1848 Oral     SpO2 02/03/20 1848 97 %     Weight 02/03/20 1849 220 lb (99.8 kg)     Height 02/03/20 1849 5\' 8"  (1.727 m)     Head Circumference --      Peak Flow --      Pain Score 02/03/20 1848 0     Pain Loc --      Pain Edu? --      Excl. in GC? --     Constitutional: Alert and oriented. Well appearing and in no distress. GCS = 15 Head: Normocephalic and atraumatic. Eyes: Conjunctivae are normal. PERRL. Normal extraocular movements Cardiovascular: Normal rate, regular rhythm. Normal distal pulses. Respiratory: Normal respiratory effort. No wheezes/rales/rhonchi. Gastrointestinal: Soft and nontender. No distention. Musculoskeletal: Nontender with normal range of motion in all extremities.  Neurologic:  Normal gait  without ataxia. Normal speech and language. No gross focal neurologic deficits are appreciated. Skin:  Skin is warm, dry and intact. No rash noted. Psychiatric: Mood and affect are normal. Patient exhibits appropriate insight and judgment. ____________________________________________   LABS (pertinent positives/negatives) Labs Reviewed  BASIC METABOLIC PANEL - Abnormal; Notable for the following components:      Result Value   Potassium 3.4 (*)    Calcium 8.1 (*)    All other components within normal limits  CBC WITH DIFFERENTIAL/PLATELET - Abnormal; Notable for the following components:   HCT 35.3 (*)    All other components within normal limits  SALICYLATE LEVEL - Abnormal; Notable for the following components:   Salicylate Lvl <7.0 (*)    All other components within normal limits  ACETAMINOPHEN LEVEL - Abnormal; Notable for the following components:   Acetaminophen (Tylenol), Serum <10 (*)    All other components within normal limits  ____________________________________________  EKG NSR 67 bpm PR Interval 128 ms QRS duration 78 ms Normal  axis No STEMI ____________________________________________  PROCEDURES  Ativan 1 mg PO  Procedures ____________________________________________  INITIAL IMPRESSION / ASSESSMENT AND PLAN / ED COURSE  Patient with ED evaluation of accidental, unintentional overdose of Effexor pill when she took 3 pills at 1 time the day following having missed her dose.  Patient presents with some increased anxiety related to her overdose but denies any chest pain, shortness of breath, weakness, nausea, vomiting, dizziness.  Exam is overall benign reassuring this time.  Labs are also without any signs of any acute toxicity.  Patient treated with a single dose of Ativan in the ED.  Discharged to follow-up with her primary provider peer return precautions have been reviewed.  Sandy Mason was evaluated in Emergency Department on 02/03/2020 for the  symptoms described in the history of present illness. She was evaluated in the context of the global COVID-19 pandemic, which necessitated consideration that the patient might be at risk for infection with the SARS-CoV-2 virus that causes COVID-19. Institutional protocols and algorithms that pertain to the evaluation of patients at risk for COVID-19 are in a state of rapid change based on information released by regulatory bodies including the CDC and federal and state organizations. These policies and algorithms were followed during the patient's care in the ED.  I reviewed the patient's prescription history over the last 12 months in the multi-state controlled substances database(s) that includes [place], [place], [place], [place], [place], [place], [place], [place], [place], [place], [place], [place], [place], and [place].  Results were notable for no current RX.  ____________________________________________  FINAL CLINICAL IMPRESSION(S) / ED DIAGNOSES  Final diagnoses:  Accidental drug overdose, initial encounter      Lissa Hoard, PA-C 02/03/20 2322    Emily Filbert, MD 02/04/20 1501

## 2020-02-03 NOTE — Discharge Instructions (Signed)
Your exam and labs are normal at this time.  You may restart your medication tomorrow as prescribed.  Follow-up with your primary provider for ongoing symptoms.  Return to the ED for worsening symptoms.

## 2020-02-03 NOTE — ED Notes (Signed)
Water and phone provided to pt.

## 2020-02-03 NOTE — ED Notes (Signed)
Attempted to D/c patient. PT not found in room. Will reassess.

## 2020-02-22 ENCOUNTER — Other Ambulatory Visit: Payer: Self-pay

## 2020-02-22 ENCOUNTER — Encounter: Payer: Self-pay | Admitting: Emergency Medicine

## 2020-02-22 ENCOUNTER — Emergency Department: Payer: Medicaid Other

## 2020-02-22 ENCOUNTER — Emergency Department
Admission: EM | Admit: 2020-02-22 | Discharge: 2020-02-22 | Disposition: A | Discharge status: Home / Self Care | Payer: Medicaid Other | Attending: Emergency Medicine | Admitting: Emergency Medicine

## 2020-02-22 DIAGNOSIS — F17210 Nicotine dependence, cigarettes, uncomplicated: Secondary | ICD-10-CM | POA: Diagnosis not present

## 2020-02-22 DIAGNOSIS — W010XXA Fall on same level from slipping, tripping and stumbling without subsequent striking against object, initial encounter: Secondary | ICD-10-CM | POA: Insufficient documentation

## 2020-02-22 DIAGNOSIS — S0992XA Unspecified injury of nose, initial encounter: Secondary | ICD-10-CM | POA: Diagnosis present

## 2020-02-22 DIAGNOSIS — S022XXA Fracture of nasal bones, initial encounter for closed fracture: Secondary | ICD-10-CM | POA: Insufficient documentation

## 2020-02-22 DIAGNOSIS — Y999 Unspecified external cause status: Secondary | ICD-10-CM | POA: Diagnosis not present

## 2020-02-22 DIAGNOSIS — Y9301 Activity, walking, marching and hiking: Secondary | ICD-10-CM | POA: Diagnosis not present

## 2020-02-22 DIAGNOSIS — S0093XA Contusion of unspecified part of head, initial encounter: Secondary | ICD-10-CM | POA: Diagnosis not present

## 2020-02-22 DIAGNOSIS — S0083XA Contusion of other part of head, initial encounter: Secondary | ICD-10-CM

## 2020-02-22 DIAGNOSIS — Y92009 Unspecified place in unspecified non-institutional (private) residence as the place of occurrence of the external cause: Secondary | ICD-10-CM | POA: Diagnosis not present

## 2020-02-22 MED ORDER — OXYCODONE-ACETAMINOPHEN 5-325 MG PO TABS: 1.0000 | ORAL_TABLET | Freq: Four times a day (QID) | ORAL | 0 refills | Status: DC | PRN

## 2020-02-22 MED ORDER — OXYCODONE-ACETAMINOPHEN 5-325 MG PO TABS
1.0000 | ORAL_TABLET | Freq: Once | ORAL | Status: AC
  Administered 2020-02-22: 1 via ORAL
  Filled 2020-02-22: qty 1

## 2020-02-22 NOTE — ED Provider Notes (Signed)
[facility] Emergency Department Provider Note  ____________________________________________  Time seen: Approximately 8:29 PM  I have reviewed the triage vital signs and the nursing notes.   HISTORY  Chief Complaint Facial Injury    HPI Sandy Mason is a 44 y.o. female with a history of alcohol abuse, PTSD, opioid use, seizures, depression who comes the ED complaining of nasal pain after a fall.  She reports that she was feeling sad because her birthday head, and her family did not call her, and so she drank 240s of alcohol this morning as a coping mechanism.  As she was walking back up the steps of her house, she tripped and fell forward hitting her nose on the steps.  Denies loss of consciousness.  No other significant injuries other than abrasion to the left forearm and right knee.  She is ambulatory, feels fine from a trauma perspective, denies neck pain or vision change.  Also states that she is having some persistent depression symptoms consisting of depressed mood, hopelessness.  No guilty feelings.  No changes in vision or appetite or energy level.  Denies SI HI or hallucinations.  Does note that she had recently run out of her Lexapro but is restarted it, and has been taking it again for the past week.  She follows up with [facility] and has an appointment coming up in 1 week.    Last tetanus was about 4 years ago   Past Medical History:  Diagnosis Date  . Alcohol use disorder severe. 12/18/2014  . Anxiety   . Borderline personality disorder (HCC) 12/18/2014  . CVA (cerebral infarction)   . Depression   . 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 Opioid use disorder, severe, dependence (HCC) 12/20/2014  . PTSD (post-traumatic stress disorder) 03/28/2015  . 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 Seizures (HCC)   . Suicide attempt by hanging [facility])      Patient  Active Problem List   Diagnosis Date Noted  . Major depressive disorder, recurrent severe without psychotic features (HCC) 04/11/2017  . Salicylate overdose 04/09/2017  . AKI (acute kidney injury) (HCC) 04/03/2016  . Suicidal behavior 01/16/2016  . Alcohol intoxication (HCC)   . Severe episode of recurrent major depressive disorder, without psychotic features (HCC)   . Malingering 12/16/2015  . Major depressive disorder, recurrent episode, moderate (HCC) 12/12/2015  . Closed pelvic fracture (HCC) 10/31/2015  . Tobacco abuse 10/31/2015  . Tobacco use disorder 03/28/2015  . PTSD (post-traumatic stress disorder) 03/28/2015  . Stimulant use disorder (cocaine) 03/28/2015  . 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 Opioid use disorder, severe, dependence (HCC) 12/20/2014  . Alcohol use disorder severe. 12/18/2014  . Borderline personality disorder (HCC) 12/18/2014     Past Surgical History:  Procedure Laterality Date  . NO PAST SURGERIES       Prior to Admission medications   Medication Sig Start Date End Date Taking? Authorizing Provider  acetaminophen (TYLENOL) 325 MG tablet Take by mouth every 6 (six) hours as needed.  02/20/13   [provider]  gabapentin (NEURONTIN) 300 MG capsule Take 900 mg by mouth 3 (three) times daily.    [provider]  hydrOXYzine (ATARAX/VISTARIL) 25 MG tablet Take 25 mg by mouth 2 (two) times daily as needed. 11/23/19   [provider]  levonorgestrel (MIRENA) 20 MCG/24HR IUD 1 each by Intrauterine route once.    [provider]  mirtazapine (REMERON) 30 MG tablet Take 30 mg by mouth at bedtime.    [provider]  naloxone [facility]) nasal spray 4 mg/0.1 mL Nasal spray in case of heroin overdose again 03/15/19   Emily Filbert, MD  oxyCODONE-acetaminophen (PERCOCET) 5-325 MG tablet Take 1 tablet by mouth every 6 (six) hours as needed for severe pain. 02/22/20  02/21/21  Sharman Cheek, MD  pantoprazole (PROTONIX) 20 MG tablet Take 1 tablet (20 mg total) by mouth daily. 09/28/19 09/27/20  Jene Every, MD  REXULTI 2 MG TABS tablet Take 2 mg by mouth daily. 11/24/19   [provider]  sucralfate (CARAFATE) 1 g tablet Take 1 tablet (1 g total) by mouth 4 (four) times daily for 15 days. 09/28/19 12/29/19  Jene Every, MD  venlafaxine XR (EFFEXOR-XR) 150 MG 24 hr capsule Take 300 mg by mouth daily with breakfast.     [provider]     Allergies Depakote [valproic acid], Haldol [haloperidol lactate], Buspar [buspirone], Meloxicam, and Keflet [cephalexin]   Family History  Problem Relation Age of Onset  . Diabetes Father   . Dementia Mother   . Anxiety disorder Mother     Social History Social History   Tobacco Use  . Smoking status: Current Every Day Smoker    Packs/day: 0.00    Years: 20.00    Pack years: 0.00    Types: Cigarettes  . Smokeless tobacco: Never Used  . Tobacco comment: 1-2 cigarettes per day  Vaping Use  . Vaping Use: Never used  Substance Use Topics  . Alcohol use: Yes    Comment: socially  . Drug use: No    Types: Benzodiazepines, Hydrocodone    Review of Systems  Constitutional:   No fever or chills.  ENT:   No sore throat. No rhinorrhea. Cardiovascular:   No chest pain or syncope. Respiratory:   No dyspnea or cough. Gastrointestinal:   Negative for abdominal pain, vomiting and diarrhea.  Musculoskeletal: Facial pain as above All other systems reviewed and are negative except as documented above in ROS and HPI.  ____________________________________________   PHYSICAL EXAM:  VITAL SIGNS: ED Triage Vitals  Enc Vitals Group     BP 02/22/20 1458 (!) 100/49     Pulse Rate 02/22/20 1458 77     Resp 02/22/20 1458 16     Temp 02/22/20 1458 98.7 F (37.1 C)     Temp Source 02/22/20 1458 Oral     SpO2 02/22/20 1458 95 %     Weight 02/22/20 1457 (!) 220 lb 0.3 oz (99.8 kg)     Height --       Head Circumference --      Peak Flow --      Pain Score 02/22/20 1457 0     Pain Loc --      Pain Edu? --      Excl. in GC? --     Vital signs reviewed, nursing assessments reviewed.   Constitutional:   Alert and oriented. Non-toxic appearance. Eyes:   Conjunctivae are normal. EOMI. PERRL. ENT      Head:   Normocephalic with swelling and bruising of the nose.  There is tenderness of the area without crepitus.  No laceration..      Nose: Scant dried blood in the nares, no septal hematoma or epistaxis.      Mouth/Throat: No dental injuries, no malocclusion.  No intraoral injuries.      Neck:  No meningismus. Full ROM.  No midline spinal tenderness. Hematological/Lymphatic/Immunilogical:   No cervical lymphadenopathy. Cardiovascular:   RRR. Symmetric bilateral radial and DP pulses.  No murmurs. Cap refill less than 2 seconds. Respiratory:   Normal respiratory effort without tachypnea/retractions. Breath sounds are clear and equal bilaterally. No wheezes/rales/rhonchi. Gastrointestinal:   Soft and nontender. Non distended. There is no CVA tenderness.  No rebound, rigidity, or guarding.  Musculoskeletal:   Normal range of motion in all extremities. No joint effusions.  No lower extremity tenderness.  No edema. Neurologic:   Normal speech and language.  Motor grossly intact. No acute focal neurologic deficits are appreciated.  Skin:    Skin is warm, dry with slight abrasions on left forearm and right knee, hemostatic.  No rash noted.  No petechiae, purpura, or bullae.  ____________________________________________    LABS (pertinent positives/negatives) (all labs ordered are listed, but only abnormal results are displayed) Labs Reviewed - No data to display ____________________________________________   EKG    ____________________________________________    RADIOLOGY  CT Head Wo Contrast  Result Date: 02/22/2020 CLINICAL DATA:  Facial trauma.  Additional provided:  Fall. EXAM: CT HEAD WITHOUT CONTRAST CT MAXILLOFACIAL WITHOUT CONTRAST CT CERVICAL SPINE WITHOUT CONTRAST TECHNIQUE: Multidetector CT imaging of the head, cervical spine, and maxillofacial structures were performed using the standard protocol without intravenous contrast. Multiplanar CT image reconstructions of the cervical spine and maxillofacial structures were also generated. COMPARISON:  CT head/maxillofacial/cervical spine 02/28/2019. FINDINGS: CT HEAD FINDINGS Brain: Cerebral volume is normal for age. There is no acute intracranial hemorrhage. No demarcated cortical infarct. No extra-axial fluid collection. No evidence of intracranial mass. No midline shift. Vascular: No hyperdense vessel. Skull: Normal. Negative for fracture or focal lesion. Other: No significant mastoid effusion. CT MAXILLOFACIAL FINDINGS Osseous: Subtle deformity of the bilateral nasal bones, new as compared to the prior examination of 02/28/2019, which may reflect minimally displaced acute fractures. Elsewhere, no acute maxillofacial fracture is demonstrated. Orbits: No acute abnormality. The globes are normal in size and contour. The extraocular muscles and optic nerve sheath complexes are symmetric and unremarkable. Sinuses: Trace right maxillary sinus mucosal thickening. Soft tissues: Mild soft tissue swelling of the nose. Nonspecific prominence of bilateral level II lymph nodes. Most notably, a right level II lymph node measures at the upper limits of normal for in short axis (11 mm) (series 2, image 62). CT CERVICAL SPINE FINDINGS Alignment: Cervical levocurvature nonspecific reversal of the expected cervical lordosis. No significant spondylolisthesis. Skull base and vertebrae: The basion-dental and atlanto-dental intervals are maintained.No evidence of acute fracture to the cervical spine. Soft tissues and spinal canal: No prevertebral fluid or swelling. No visible canal hematoma. Disc levels: No significant bony spinal canal or  neural foraminal narrowing at any level. Upper chest: The lung apices are excluded from the field of view. IMPRESSION: CT head: No evidence of acute intracranial abnormality. CT maxillofacial: 1. Subtle deformity of the bilateral nasal bones which is new as compared to the prior examination of 02/28/2019 and may reflect minimally displaced acute fractures. Mild overlying soft tissue swelling. 2. Nonspecific prominence of bilateral level II lymph nodes. Clinical correlation is recommended. CT cervical spine: 1. No evidence of acute fracture to the cervical spine. 2. Reversal of the expected cervical lordosis. 3. Cervical levocurvature. Electronically Signed   By: Jackey Loge DO   On: 02/22/2020 17:25   CT Cervical Spine Wo Contrast  Result Date: 02/22/2020 CLINICAL DATA:  Facial trauma.  Additional provided: Fall. EXAM: CT HEAD  WITHOUT CONTRAST CT MAXILLOFACIAL WITHOUT CONTRAST CT CERVICAL SPINE WITHOUT CONTRAST TECHNIQUE: Multidetector CT imaging of the head, cervical spine, and maxillofacial structures were performed using the standard protocol without intravenous contrast. Multiplanar CT image reconstructions of the cervical spine and maxillofacial structures were also generated. COMPARISON:  CT head/maxillofacial/cervical spine 02/28/2019. FINDINGS: CT HEAD FINDINGS Brain: Cerebral volume is normal for age. There is no acute intracranial hemorrhage. No demarcated cortical infarct. No extra-axial fluid collection. No evidence of intracranial mass. No midline shift. Vascular: No hyperdense vessel. Skull: Normal. Negative for fracture or focal lesion. Other: No significant mastoid effusion. CT MAXILLOFACIAL FINDINGS Osseous: Subtle deformity of the bilateral nasal bones, new as compared to the prior examination of 02/28/2019, which may reflect minimally displaced acute fractures. Elsewhere, no acute maxillofacial fracture is demonstrated. Orbits: No acute abnormality. The globes are normal in size and contour.  The extraocular muscles and optic nerve sheath complexes are symmetric and unremarkable. Sinuses: Trace right maxillary sinus mucosal thickening. Soft tissues: Mild soft tissue swelling of the nose. Nonspecific prominence of bilateral level II lymph nodes. Most notably, a right level II lymph node measures at the upper limits of normal for in short axis (11 mm) (series 2, image 62). CT CERVICAL SPINE FINDINGS Alignment: Cervical levocurvature nonspecific reversal of the expected cervical lordosis. No significant spondylolisthesis. Skull base and vertebrae: The basion-dental and atlanto-dental intervals are maintained.No evidence of acute fracture to the cervical spine. Soft tissues and spinal canal: No prevertebral fluid or swelling. No visible canal hematoma. Disc levels: No significant bony spinal canal or neural foraminal narrowing at any level. Upper chest: The lung apices are excluded from the field of view. IMPRESSION: CT head: No evidence of acute intracranial abnormality. CT maxillofacial: 1. Subtle deformity of the bilateral nasal bones which is new as compared to the prior examination of 02/28/2019 and may reflect minimally displaced acute fractures. Mild overlying soft tissue swelling. 2. Nonspecific prominence of bilateral level II lymph nodes. Clinical correlation is recommended. CT cervical spine: 1. No evidence of acute fracture to the cervical spine. 2. Reversal of the expected cervical lordosis. 3. Cervical levocurvature. Electronically Signed   By: Jackey Loge DO   On: 02/22/2020 17:25   CT Maxillofacial Wo Contrast  Result Date: 02/22/2020 CLINICAL DATA:  Facial trauma.  Additional provided: Fall. EXAM: CT HEAD WITHOUT CONTRAST CT MAXILLOFACIAL WITHOUT CONTRAST CT CERVICAL SPINE WITHOUT CONTRAST TECHNIQUE: Multidetector CT imaging of the head, cervical spine, and maxillofacial structures were performed using the standard protocol without intravenous contrast. Multiplanar CT image  reconstructions of the cervical spine and maxillofacial structures were also generated. COMPARISON:  CT head/maxillofacial/cervical spine 02/28/2019. FINDINGS: CT HEAD FINDINGS Brain: Cerebral volume is normal for age. There is no acute intracranial hemorrhage. No demarcated cortical infarct. No extra-axial fluid collection. No evidence of intracranial mass. No midline shift. Vascular: No hyperdense vessel. Skull: Normal. Negative for fracture or focal lesion. Other: No significant mastoid effusion. CT MAXILLOFACIAL FINDINGS Osseous: Subtle deformity of the bilateral nasal bones, new as compared to the prior examination of 02/28/2019, which may reflect minimally displaced acute fractures. Elsewhere, no acute maxillofacial fracture is demonstrated. Orbits: No acute abnormality. The globes are normal in size and contour. The extraocular muscles and optic nerve sheath complexes are symmetric and unremarkable. Sinuses: Trace right maxillary sinus mucosal thickening. Soft tissues: Mild soft tissue swelling of the nose. Nonspecific prominence of bilateral level II lymph nodes. Most notably, a right level II lymph node measures at the upper limits of normal  for in short axis (11 mm) (series 2, image 62). CT CERVICAL SPINE FINDINGS Alignment: Cervical levocurvature nonspecific reversal of the expected cervical lordosis. No significant spondylolisthesis. Skull base and vertebrae: The basion-dental and atlanto-dental intervals are maintained.No evidence of acute fracture to the cervical spine. Soft tissues and spinal canal: No prevertebral fluid or swelling. No visible canal hematoma. Disc levels: No significant bony spinal canal or neural foraminal narrowing at any level. Upper chest: The lung apices are excluded from the field of view. IMPRESSION: CT head: No evidence of acute intracranial abnormality. CT maxillofacial: 1. Subtle deformity of the bilateral nasal bones which is new as compared to the prior examination of  02/28/2019 and may reflect minimally displaced acute fractures. Mild overlying soft tissue swelling. 2. Nonspecific prominence of bilateral level II lymph nodes. Clinical correlation is recommended. CT cervical spine: 1. No evidence of acute fracture to the cervical spine. 2. Reversal of the expected cervical lordosis. 3. Cervical levocurvature. Electronically Signed   By: Jackey LogeKyle  Golden DO   On: 02/22/2020 17:25    ____________________________________________   PROCEDURES Procedures  ____________________________________________  DIFFERENTIAL DIAGNOSIS   Intracranial hemorrhage, facial fracture, C-spine fracture  CLINICAL IMPRESSION / ASSESSMENT AND PLAN / ED COURSE  Medications ordered in the ED: Medications  oxyCODONE-acetaminophen (PERCOCET/ROXICET) 5-325 MG per tablet 1 tablet (1 tablet Oral Given 02/22/20 2026)    Pertinent labs & imaging results that were available during my care of the patient were reviewed by me and considered in my medical decision making (see chart for details).  Sandy SprangJennifer Joy Hanak was evaluated in Emergency Department on 02/22/2020 for the symptoms described in the history of present illness. She was evaluated in the context of the global COVID-19 pandemic, which necessitated consideration that the patient might be at risk for infection with the SARS-CoV-2 virus that causes COVID-19. Institutional protocols and algorithms that pertain to the evaluation of patients at risk for COVID-19 are in a state of rapid change based on information released by regulatory bodies including the CDC and federal and state organizations. These policies and algorithms were followed during the patient's care in the ED.   Patient presents with facial pain after a trip and fall with blunt trauma to the face and head while intoxicated.  This occurred at about 11:00 AM today.  Currently she is clinically sober.  CT scans were obtained which are negative for any acute injury other than some  slight facial bone fractures.  No urgent intervention needed, will refer to follow-up with ENT in 1 to 2 weeks.  Short course of pain medication as needed.  Regarding her depression, this appears to be due to medication noncompliance, likely improving now that she is restarted her medicine.  She has follow-up in place.  Do not think she is an imminent danger to herself or others, not committable, suitable for continued outpatient care.      ____________________________________________   FINAL CLINICAL IMPRESSION(S) / ED DIAGNOSES    Final diagnoses:  Facial contusion, initial encounter  Closed fracture of nasal bone, initial encounter     ED Discharge Orders         Ordered    oxyCODONE-acetaminophen (PERCOCET) 5-325 MG tablet  Every 6 hours PRN,   Status:  Discontinued     Reprint     02/22/20 2028    oxyCODONE-acetaminophen (PERCOCET) 5-325 MG tablet  Every 6 hours PRN     Discontinue  Reprint     02/22/20 2029  Portions of this note were generated with dragon dictation software. Dictation errors may occur despite best attempts at proofreading.   Sharman Cheek, MD 02/22/20 2034

## 2020-02-22 NOTE — ED Triage Notes (Signed)
Pt in via EMS from home with c/o fall. EMS reports pt was drinking today and went to go outside and fell down the steps hitting her face. No LOC. Pt admits to having 2 40's this am. Pt sad that noone from her family called her on her bday and wants some kind of couseling

## 2020-02-22 NOTE — ED Triage Notes (Signed)
Denies SI/ HI.  Wishes to speak to mental health.

## 2020-02-27 ENCOUNTER — Other Ambulatory Visit: Payer: Self-pay

## 2020-02-27 ENCOUNTER — Emergency Department
Admission: EM | Admit: 2020-02-27 | Discharge: 2020-02-27 | Disposition: A | Discharge status: Home / Self Care | Payer: Medicaid Other | Attending: Emergency Medicine | Admitting: Emergency Medicine

## 2020-02-27 DIAGNOSIS — F4310 Post-traumatic stress disorder, unspecified: Secondary | ICD-10-CM | POA: Diagnosis not present

## 2020-02-27 DIAGNOSIS — R0981 Nasal congestion: Secondary | ICD-10-CM | POA: Diagnosis not present

## 2020-02-27 DIAGNOSIS — R519 Headache, unspecified: Secondary | ICD-10-CM | POA: Diagnosis present

## 2020-02-27 DIAGNOSIS — G8911 Acute pain due to trauma: Secondary | ICD-10-CM | POA: Insufficient documentation

## 2020-02-27 DIAGNOSIS — F1120 Opioid dependence, uncomplicated: Secondary | ICD-10-CM | POA: Insufficient documentation

## 2020-02-27 DIAGNOSIS — F418 Other specified anxiety disorders: Secondary | ICD-10-CM | POA: Diagnosis not present

## 2020-02-27 DIAGNOSIS — X58XXXD Exposure to other specified factors, subsequent encounter: Secondary | ICD-10-CM | POA: Diagnosis not present

## 2020-02-27 DIAGNOSIS — S022XXG Fracture of nasal bones, subsequent encounter for fracture with delayed healing: Secondary | ICD-10-CM | POA: Diagnosis not present

## 2020-02-27 DIAGNOSIS — F17210 Nicotine dependence, cigarettes, uncomplicated: Secondary | ICD-10-CM | POA: Insufficient documentation

## 2020-02-27 MED ORDER — OXYCODONE-ACETAMINOPHEN 5-325 MG PO TABS
1.0000 | ORAL_TABLET | Freq: Once | ORAL | Status: AC
  Administered 2020-02-27: 1 via ORAL
  Filled 2020-02-27: qty 1

## 2020-02-27 MED ORDER — PSEUDOEPHEDRINE HCL ER 120 MG PO TB12: 120.0000 mg | ORAL_TABLET | Freq: Two times a day (BID) | ORAL | 2 refills | Status: DC | PRN

## 2020-02-27 MED ORDER — OXYCODONE-ACETAMINOPHEN 7.5-325 MG PO TABS: 1.0000 | ORAL_TABLET | Freq: Four times a day (QID) | ORAL | 0 refills | Status: DC | PRN

## 2020-02-27 NOTE — Discharge Instructions (Signed)
Follow discharge care instruction take medication as directed. °

## 2020-02-27 NOTE — ED Notes (Signed)
Pt reports pain from facial injury a few days ago, was prescribed pain meds, but is out and continues to c/o pain.

## 2020-02-27 NOTE — ED Provider Notes (Signed)
[facility] Emergency Department Provider Note   ____________________________________________   First MD Initiated Contact with Patient 02/27/20 1606     (approximate)  I have reviewed the triage vital signs and the nursing notes.   HISTORY  Chief Complaint Facial Injury    HPI Sandy Mason is a 44 y.o. female patient complain continued pain from nasal injury 5 days ago.  Patient states pain is compounded by nasal congestion and facial pain.  Patient stated no relief taking over-the-counter ibuprofen.  Patient is run out of her 2 days of narcotic pain medication.         Past Medical History:  Diagnosis Date  . Alcohol use disorder severe. 12/18/2014  . Anxiety   . Borderline personality disorder (HCC) 12/18/2014  . CVA (cerebral infarction)   . Depression   . 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 Opioid use disorder, severe, dependence (HCC) 12/20/2014  . PTSD (post-traumatic stress disorder) 03/28/2015  . 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 Seizures (HCC)   . Suicide attempt by hanging [facility])     Patient Active Problem List   Diagnosis Date Noted  . Major depressive disorder, recurrent severe without psychotic features (HCC) 04/11/2017  . Salicylate overdose 04/09/2017  . AKI (acute kidney injury) (HCC) 04/03/2016  . Suicidal behavior 01/16/2016  . Alcohol intoxication (HCC)   . Severe episode of recurrent major depressive disorder, without psychotic features (HCC)   . Malingering 12/16/2015  . Major depressive disorder, recurrent episode, moderate (HCC) 12/12/2015  . Closed pelvic fracture (HCC) 10/31/2015  . Tobacco abuse 10/31/2015  . Tobacco use disorder 03/28/2015  . PTSD (post-traumatic stress disorder) 03/28/2015  . Stimulant use disorder (cocaine) 03/28/2015  . Sedative, hypnotic or anxiolytic use disorder, severe, dependence (HCC)  03/27/2015  . Opioid use disorder, severe, dependence (HCC) 12/20/2014  . Alcohol use disorder severe. 12/18/2014  . Borderline personality disorder (HCC) 12/18/2014    Past Surgical History:  Procedure Laterality Date  . NO PAST SURGERIES      Prior to Admission medications   Medication Sig Start Date End Date Taking? Authorizing Provider  acetaminophen (TYLENOL) 325 MG tablet Take by mouth every 6 (six) hours as needed.  02/20/13   [provider]  gabapentin (NEURONTIN) 300 MG capsule Take 900 mg by mouth 3 (three) times daily.    [provider]  hydrOXYzine (ATARAX/VISTARIL) 25 MG tablet Take 25 mg by mouth 2 (two) times daily as needed. 11/23/19   [provider]  levonorgestrel (MIRENA) 20 MCG/24HR IUD 1 each by Intrauterine route once.    [provider]  mirtazapine (REMERON) 30 MG tablet Take 30 mg by mouth at bedtime.    [provider]  naloxone [facility]) nasal spray 4 mg/0.1 mL Nasal spray in case of heroin overdose again 03/15/19   Emily Filbert, MD  oxyCODONE-acetaminophen (PERCOCET) 5-325 MG tablet Take 1 tablet by mouth every 6 (six) hours as needed for severe pain. 02/22/20 02/21/21  Sharman Cheek, MD  oxyCODONE-acetaminophen (PERCOCET) 7.5-325 MG tablet Take 1 tablet by mouth every 6 (six) hours as needed. 02/27/20   Joni Reining, PA-C  pantoprazole (PROTONIX) 20 MG tablet Take 1 tablet (20 mg total) by mouth daily. 09/28/19 09/27/20  Jene Every, MD  pseudoephedrine (SUDAFED) 120 MG 12 hr tablet Take 1 tablet (120 mg total) by mouth 2 (two) times daily as needed for congestion. 02/27/20 02/26/21  Katrinka Blazing,  Arther Abbott, PA-C  REXULTI 2 MG TABS tablet Take 2 mg by mouth daily. 11/24/19   [provider]  sucralfate (CARAFATE) 1 g tablet Take 1 tablet (1 g total) by mouth 4 (four) times daily for 15 days. 09/28/19 12/29/19  Jene Every, MD  venlafaxine XR (EFFEXOR-XR) 150 MG 24 hr capsule Take 300 mg by mouth daily with  breakfast.     [provider]    Allergies Depakote [valproic acid], Haldol [haloperidol lactate], Buspar [buspirone], Meloxicam, and Keflet [cephalexin]  Family History  Problem Relation Age of Onset  . Diabetes Father   . Dementia Mother   . Anxiety disorder Mother     Social History Social History   Tobacco Use  . Smoking status: Current Every Day Smoker    Packs/day: 0.00    Years: 20.00    Pack years: 0.00    Types: Cigarettes  . Smokeless tobacco: Never Used  . Tobacco comment: 1-2 cigarettes per day  Vaping Use  . Vaping Use: Never used  Substance Use Topics  . Alcohol use: Yes    Comment: socially  . Drug use: No    Types: Benzodiazepines, Hydrocodone    Review of Systems Constitutional: No fever/chills Eyes: No visual changes. ENT: No sore throat.  Nasal pain/congestion and facial pain. Cardiovascular: Denies chest pain. Respiratory: Denies shortness of breath. Gastrointestinal: No abdominal pain.  No nausea, no vomiting.  No diarrhea.  No constipation. Genitourinary: Negative for dysuria. Musculoskeletal: Negative for back pain. Skin: Negative for rash. Neurological: Negative for headaches, focal weakness or numbness. Psychiatric:  Anxiety, depression, opiate dependency, and PTSD. Endocrine:  Hematological/Lymphatic:  Allergic/Immunilogical: Depakote, Haldol, BuSpar, meloxicam, and Keflex. ____________________________________________   PHYSICAL EXAM:  VITAL SIGNS: ED Triage Vitals  Enc Vitals Group     BP 02/27/20 1530 125/85     Pulse Rate 02/27/20 1530 93     Resp 02/27/20 1530 18     Temp 02/27/20 1530 98.7 F (37.1 C)     Temp Source 02/27/20 1530 Oral     SpO2 02/27/20 1530 99 %     Weight 02/27/20 1531 (!) 220 lb (99.8 kg)     Height 02/27/20 1531 5\' 8"  (1.727 m)     Head Circumference --      Peak Flow --      Pain Score 02/27/20 1531 10     Pain Loc --      Pain Edu? --      Excl. in GC? --    Constitutional: Alert  and oriented. Well appearing and in no acute distress. Eyes: Conjunctivae are normal. PERRL. EOMI. Head: Atraumatic. Nose: Edema but no obvious deformity. Mouth/Throat: Mucous membranes are moist.  Oropharynx non-erythematous. Neck: No stridor.  Hematological/Lymphatic/Immunilogical: No cervical lymphadenopathy. Cardiovascular: Normal rate, regular rhythm. Grossly normal heart sounds.  Good peripheral circulation. Respiratory: Normal respiratory effort.  No retractions. Lungs CTAB. Gastrointestinal: Soft and nontender. No distention. No abdominal bruits. No CVA tenderness. Neurologic:  Normal speech and language. No gross focal neurologic deficits are appreciated. No gait instability. Skin:  Skin is warm, dry and intact. No rash noted. Psychiatric: Mood and affect are normal. Speech and behavior are normal.  ____________________________________________   LABS (all labs ordered are listed, but only abnormal results are displayed)  Labs Reviewed - No data to display ____________________________________________  EKG   ____________________________________________  RADIOLOGY  ED MD interpretation:    Official radiology report(s): No results found.  ____________________________________________   PROCEDURES  Procedure(s) performed (  including Critical Care):  Procedures   ____________________________________________   INITIAL IMPRESSION / ASSESSMENT AND PLAN / ED COURSE  As part of my medical decision making, I reviewed the following data within the electronic MEDICAL RECORD NUMBER     Patient presents with nasal pain and congestion.  Patient has a history of blunt trauma to the nose approximately 1 week ago with subtle deformity of the bilateral nasal bones.  Patient given a consult to follow-up with ENT.  Patient given a prescription for Sudafed and Percocets.   Coco Sharpnack was evaluated in Emergency Department on 02/27/2020 for the symptoms described in the history of  present illness. She was evaluated in the context of the global COVID-19 pandemic, which necessitated consideration that the patient might be at risk for infection with the SARS-CoV-2 virus that causes COVID-19. Institutional protocols and algorithms that pertain to the evaluation of patients at risk for COVID-19 are in a state of rapid change based on information released by regulatory bodies including the CDC and federal and state organizations. These policies and algorithms were followed during the patient's care in the ED.       ____________________________________________   FINAL CLINICAL IMPRESSION(S) / ED DIAGNOSES  Final diagnoses:  Fracture of nasal bones, subsequent encounter for fracture with delayed healing  Facial pain  Nasal sinus congestion     ED Discharge Orders         Ordered    pseudoephedrine (SUDAFED) 120 MG 12 hr tablet  2 times daily PRN     Discontinue  Reprint     02/27/20 1714    oxyCODONE-acetaminophen (PERCOCET) 7.5-325 MG tablet  Every 6 hours PRN     Discontinue  Reprint     02/27/20 1714           Note:  This document was prepared using Dragon voice recognition software and may include unintentional dictation errors.    Joni Reining, PA-C 02/27/20 1724    Sharyn Creamer, MD 02/29/20 [phone]

## 2020-02-27 NOTE — ED Triage Notes (Signed)
Pt arrives via POV for reports of continued pain from injuring nose on 02/22/20. Pt reports she was given 8 tablets for pain and has used them already and ibuprofen is not helping. Pt ambulatory from triage in NAD, A&Ox4.

## 2020-03-09 ENCOUNTER — Emergency Department
Admission: EM | Admit: 2020-03-09 | Discharge: 2020-03-09 | Disposition: A | Discharge status: Home / Self Care | Payer: Medicaid Other | Attending: Emergency Medicine | Admitting: Emergency Medicine

## 2020-03-09 ENCOUNTER — Other Ambulatory Visit: Payer: Self-pay

## 2020-03-09 ENCOUNTER — Emergency Department: Payer: Medicaid Other

## 2020-03-09 DIAGNOSIS — Y9289 Other specified places as the place of occurrence of the external cause: Secondary | ICD-10-CM | POA: Insufficient documentation

## 2020-03-09 DIAGNOSIS — S0083XA Contusion of other part of head, initial encounter: Secondary | ICD-10-CM | POA: Insufficient documentation

## 2020-03-09 DIAGNOSIS — F17210 Nicotine dependence, cigarettes, uncomplicated: Secondary | ICD-10-CM | POA: Insufficient documentation

## 2020-03-09 DIAGNOSIS — Y9389 Activity, other specified: Secondary | ICD-10-CM | POA: Insufficient documentation

## 2020-03-09 DIAGNOSIS — Y998 Other external cause status: Secondary | ICD-10-CM | POA: Diagnosis not present

## 2020-03-09 DIAGNOSIS — S0993XA Unspecified injury of face, initial encounter: Secondary | ICD-10-CM | POA: Diagnosis present

## 2020-03-09 DIAGNOSIS — W010XXA Fall on same level from slipping, tripping and stumbling without subsequent striking against object, initial encounter: Secondary | ICD-10-CM | POA: Insufficient documentation

## 2020-03-09 DIAGNOSIS — R519 Headache, unspecified: Secondary | ICD-10-CM | POA: Diagnosis not present

## 2020-03-09 DIAGNOSIS — W108XXA Fall (on) (from) other stairs and steps, initial encounter: Secondary | ICD-10-CM

## 2020-03-09 MED ORDER — NAPROXEN 500 MG PO TABS
500.0000 mg | ORAL_TABLET | Freq: Two times a day (BID) | ORAL | 0 refills | Status: DC
Start: 2020-03-09 — End: 2020-11-05

## 2020-03-09 MED ORDER — OXYCODONE-ACETAMINOPHEN 5-325 MG PO TABS
1.0000 | ORAL_TABLET | Freq: Once | ORAL | Status: AC
  Administered 2020-03-09: 1 via ORAL
  Filled 2020-03-09: qty 1

## 2020-03-09 NOTE — ED Triage Notes (Signed)
Pt comes POV with broken nose per pt. States fell on the 26th.

## 2020-03-09 NOTE — ED Provider Notes (Signed)
[facility] Emergency Department Provider Note  ____________________________________________  Time seen: Approximately 7:03 PM  I have reviewed the triage vital signs and the nursing notes.   HISTORY  Chief Complaint Other (nose pain)    HPI Sandy Mason is a 44 y.o. female who presents the emergency department complaining of facial pain after a mechanical fall.  Patient states that she was going up the stairway of her house when she tripped, falling striking her face on concrete.  Patient has had ongoing facial pain and nasal pain following this injury.  No headache.  No loss of consciousness.  No neck pain.  Patient denies any other complaints at this time.  She states that she is taking medications at home with no relief of symptoms.  Patient has a medical history as described below.  No complaints of chronic medical issues.         Past Medical History:  Diagnosis Date  . Alcohol use disorder severe. 12/18/2014  . Anxiety   . Borderline personality disorder (HCC) 12/18/2014  . CVA (cerebral infarction)   . Depression   . 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 Opioid use disorder, severe, dependence (HCC) 12/20/2014  . PTSD (post-traumatic stress disorder) 03/28/2015  . 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 Seizures (HCC)   . Suicide attempt by hanging [facility])     Patient Active Problem List   Diagnosis Date Noted  . Major depressive disorder, recurrent severe without psychotic features (HCC) 04/11/2017  . Salicylate overdose 04/09/2017  . AKI (acute kidney injury) (HCC) 04/03/2016  . Suicidal behavior 01/16/2016  . Alcohol intoxication (HCC)   . Severe episode of recurrent major depressive disorder, without psychotic features (HCC)   . Malingering 12/16/2015  . Major depressive disorder, recurrent episode, moderate (HCC) 12/12/2015  . Closed pelvic  fracture (HCC) 10/31/2015  . Tobacco abuse 10/31/2015  . Tobacco use disorder 03/28/2015  . PTSD (post-traumatic stress disorder) 03/28/2015  . Stimulant use disorder (cocaine) 03/28/2015  . 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 Opioid use disorder, severe, dependence (HCC) 12/20/2014  . Alcohol use disorder severe. 12/18/2014  . Borderline personality disorder (HCC) 12/18/2014    Past Surgical History:  Procedure Laterality Date  . NO PAST SURGERIES      Prior to Admission medications   Medication Sig Start Date End Date Taking? Authorizing Provider  acetaminophen (TYLENOL) 325 MG tablet Take by mouth every 6 (six) hours as needed.  02/20/13   [provider]  gabapentin (NEURONTIN) 300 MG capsule Take 900 mg by mouth 3 (three) times daily.    [provider]  hydrOXYzine (ATARAX/VISTARIL) 25 MG tablet Take 25 mg by mouth 2 (two) times daily as needed. 11/23/19   [provider]  levonorgestrel (MIRENA) 20 MCG/24HR IUD 1 each by Intrauterine route once.    [provider]  mirtazapine (REMERON) 30 MG tablet Take 30 mg by mouth at bedtime.    [provider]  naloxone [facility]) nasal spray 4 mg/0.1 mL Nasal spray in case of heroin overdose again 03/15/19   Emily Filbert, MD  naproxen (NAPROSYN) 500 MG tablet Take 1 tablet (500 mg total) by mouth 2 (two) times daily with a meal. 03/09/20 03/09/21  Vicki Chaffin, Delorise Royals, PA-C  oxyCODONE-acetaminophen (PERCOCET) 5-325 MG tablet Take 1 tablet by mouth every 6 (six) hours as needed for severe pain. 02/22/20 02/21/21  Scotty Court,  Aneta Mins, MD  pantoprazole (PROTONIX) 20 MG tablet Take 1 tablet (20 mg total) by mouth daily. 09/28/19 09/27/20  Jene Every, MD  pseudoephedrine (SUDAFED) 120 MG 12 hr tablet Take 1 tablet (120 mg total) by mouth 2 (two) times daily as needed for congestion. 02/27/20 02/26/21  Joni Reining, PA-C  REXULTI 2 MG TABS tablet Take 2  mg by mouth daily. 11/24/19   [provider]  sucralfate (CARAFATE) 1 g tablet Take 1 tablet (1 g total) by mouth 4 (four) times daily for 15 days. 09/28/19 12/29/19  Jene Every, MD  venlafaxine XR (EFFEXOR-XR) 150 MG 24 hr capsule Take 300 mg by mouth daily with breakfast.     [provider]    Allergies Depakote [valproic acid], Haldol [haloperidol lactate], Buspar [buspirone], Meloxicam, and Keflet [cephalexin]  Family History  Problem Relation Age of Onset  . Diabetes Father   . Dementia Mother   . Anxiety disorder Mother     Social History Social History   Tobacco Use  . Smoking status: Current Every Day Smoker    Packs/day: 0.00    Years: 20.00    Pack years: 0.00    Types: Cigarettes  . Smokeless tobacco: Never Used  . Tobacco comment: 1-2 cigarettes per day  Vaping Use  . Vaping Use: Never used  Substance Use Topics  . Alcohol use: Yes    Comment: socially  . Drug use: No    Types: Benzodiazepines, Hydrocodone     Review of Systems  Constitutional: No fever/chills Eyes: No visual changes. No discharge ENT: No upper respiratory complaints. Cardiovascular: no chest pain. Respiratory: no cough. No SOB. Gastrointestinal: No abdominal pain.  No nausea, no vomiting.  No diarrhea.  No constipation. Musculoskeletal: Facial pain to the nose, bilateral cheeks Skin: Negative for rash, abrasions, lacerations, ecchymosis. Neurological: Negative for headaches, focal weakness or numbness. 10-point ROS otherwise negative.  ____________________________________________   PHYSICAL EXAM:  VITAL SIGNS: ED Triage Vitals  Enc Vitals Group     BP 03/09/20 1556 117/83     Pulse Rate 03/09/20 1556 74     Resp 03/09/20 1556 18     Temp 03/09/20 1556 98.4 F (36.9 C)     Temp Source 03/09/20 1556 Oral     SpO2 03/09/20 1556 98 %     Weight 03/09/20 1557 220 lb (99.8 kg)     Height 03/09/20 1557 5\' 8"  (1.727 m)     Head Circumference --      Peak Flow  --      Pain Score 03/09/20 1557 10     Pain Loc --      Pain Edu? --      Excl. in GC? --      Constitutional: Alert and oriented. Well appearing and in no acute distress. Eyes: Conjunctivae are normal. PERRL. EOMI. Head: Atraumatic.  No visible erythema, edema, ecchymosis, abrasions or lacerations.  Patient is tender palpation along the nasal bridge and bilateral zygomatic and inferior orbits.  No palpable abnormality or crepitus.  No subcutaneous emphysema.  Good range of motion to the mandible.  No tenderness to palpation of the osseous structures of the skull and face.  No battle signs, raccoon eyes, serosanguineous fluid drainage from the ears or nares. ENT:      Ears:       Nose: No congestion/rhinnorhea.      Mouth/Throat: Mucous membranes are moist.  Neck: No stridor.  No cervical spine tenderness to palpation.  Cardiovascular: Normal rate, regular rhythm. Normal S1 and S2.  Good peripheral circulation. Respiratory: Normal respiratory effort without tachypnea or retractions. Lungs CTAB. Good air entry to the bases with no decreased or absent breath sounds. Musculoskeletal: Full range of motion to all extremities. No gross deformities appreciated. Neurologic:  Normal speech and language. No gross focal neurologic deficits are appreciated.  Skin:  Skin is warm, dry and intact. No rash noted. Psychiatric: Mood and affect are normal. Speech and behavior are normal. Patient exhibits appropriate insight and judgement.   ____________________________________________   LABS (all labs ordered are listed, but only abnormal results are displayed)  Labs Reviewed - No data to display ____________________________________________  EKG   ____________________________________________  RADIOLOGY I personally viewed and evaluated these images as part of my medical decision making, as well as reviewing the written report by the radiologist.  CT Maxillofacial Wo Contrast  Result Date:  03/09/2020 CLINICAL DATA:  44 year old female with fall and trauma to the nose. EXAM: CT MAXILLOFACIAL WITHOUT CONTRAST TECHNIQUE: Multidetector CT imaging of the maxillofacial structures was performed. Multiplanar CT image reconstructions were also generated. COMPARISON:  Head CT dated 02/22/2020. FINDINGS: Osseous: No fracture or mandibular dislocation. No destructive process. Orbits: Negative. No traumatic or inflammatory finding. Sinuses: Clear. Soft tissues: Negative. Limited intracranial: No significant or unexpected finding. IMPRESSION: No acute/traumatic facial bone fractures. Electronically Signed   By: Elgie Collard M.D.   On: 03/09/2020 19:29    ____________________________________________    PROCEDURES  Procedure(s) performed:    Procedures    Medications  oxyCODONE-acetaminophen (PERCOCET/ROXICET) 5-325 MG per tablet 1 tablet (1 tablet Oral Given 03/09/20 1922)     ____________________________________________   INITIAL IMPRESSION / ASSESSMENT AND PLAN / ED COURSE  Pertinent labs & imaging results that were available during my care of the patient were reviewed by me and considered in my medical decision making (see chart for details).  Review of the [place] CSRS was performed in accordance of the [facility] prior to dispensing any controlled drugs.           Patient's diagnosis is consistent with fall, facial contusion.  Patient presented to emergency department complaining of facial pain.  She states that she took a mechanical fall striking her face on the ground.  Overall exam was reassuring.  Imaging reveals no fractures.  To take Naprosyn for pain..  Follow-up primary care as needed.  Patient is given ED precautions to return to the ED for any worsening or new symptoms.     ____________________________________________  FINAL CLINICAL IMPRESSION(S) / ED DIAGNOSES  Final diagnoses:  Fall (on) (from) other stairs and steps, initial encounter  Contusion of face,  initial encounter      NEW MEDICATIONS STARTED DURING THIS VISIT:  ED Discharge Orders         Ordered    naproxen (NAPROSYN) 500 MG tablet  2 times daily with meals     Discontinue  Reprint     03/09/20 2021              This chart was dictated using voice recognition software/Dragon. Despite best efforts to proofread, errors can occur which can change the meaning. Any change was purely unintentional.    Racheal Patches, PA-C 03/09/20 2022    Arnaldo Natal, MD 03/09/20 2035

## 2020-03-09 NOTE — ED Notes (Signed)
Pt unable to sign E-signature due to signature pad malfunction. Pt verbalized understanding of d/c instructions and had no additional questions or concerns for this RN or provider. Pt left with d/c instructions and gathered all personal belongings from room and removed them prior to ED departure.   

## 2020-03-09 NOTE — ED Notes (Signed)
See triage note  Presents with nasal pain  States she fell and broke her nose in July  States she is still having pain  Has not seen ENT  States the last med was percocet and Sudafed that helped her

## 2020-05-03 ENCOUNTER — Other Ambulatory Visit: Payer: Self-pay

## 2020-05-03 ENCOUNTER — Encounter: Payer: Self-pay | Admitting: Emergency Medicine

## 2020-05-03 ENCOUNTER — Emergency Department
Admission: EM | Admit: 2020-05-03 | Discharge: 2020-05-03 | Disposition: A | Discharge status: Home / Self Care | Payer: Medicaid Other | Attending: Emergency Medicine | Admitting: Emergency Medicine

## 2020-05-03 ENCOUNTER — Emergency Department: Payer: Medicaid Other

## 2020-05-03 DIAGNOSIS — M791 Myalgia, unspecified site: Secondary | ICD-10-CM | POA: Insufficient documentation

## 2020-05-03 DIAGNOSIS — Z20822 Contact with and (suspected) exposure to covid-19: Secondary | ICD-10-CM | POA: Diagnosis not present

## 2020-05-03 DIAGNOSIS — F17210 Nicotine dependence, cigarettes, uncomplicated: Secondary | ICD-10-CM | POA: Insufficient documentation

## 2020-05-03 DIAGNOSIS — R531 Weakness: Secondary | ICD-10-CM | POA: Insufficient documentation

## 2020-05-03 DIAGNOSIS — R5383 Other fatigue: Secondary | ICD-10-CM | POA: Insufficient documentation

## 2020-05-03 LAB — CBC
HCT: 37.2 % (ref 36.0–46.0)
Hemoglobin: 12.4 g/dL (ref 12.0–15.0)
MCH: 31.4 pg (ref 26.0–34.0)
MCHC: 33.3 g/dL (ref 30.0–36.0)
MCV: 94.2 fL (ref 80.0–100.0)
Platelets: 351 10*3/uL (ref 150–400)
RBC: 3.95 MIL/uL (ref 3.87–5.11)
RDW: 12.6 % (ref 11.5–15.5)
WBC: 11.3 10*3/uL — ABNORMAL HIGH (ref 4.0–10.5)
nRBC: 0 % (ref 0.0–0.2)

## 2020-05-03 LAB — URINALYSIS, COMPLETE (UACMP) WITH MICROSCOPIC
Bacteria, UA: NONE SEEN
Bilirubin Urine: NEGATIVE
Glucose, UA: NEGATIVE mg/dL
Hgb urine dipstick: NEGATIVE
Ketones, ur: NEGATIVE mg/dL
Leukocytes,Ua: NEGATIVE
Nitrite: NEGATIVE
Protein, ur: NEGATIVE mg/dL
Specific Gravity, Urine: 1.002 — ABNORMAL LOW (ref 1.005–1.030)
pH: 6 (ref 5.0–8.0)

## 2020-05-03 LAB — RESPIRATORY PANEL BY RT PCR (FLU A&B, COVID)
Influenza A by PCR: NEGATIVE
Influenza B by PCR: NEGATIVE
SARS Coronavirus 2 by RT PCR: NEGATIVE

## 2020-05-03 LAB — BASIC METABOLIC PANEL
Anion gap: 9 (ref 5–15)
BUN: 12 mg/dL (ref 6–20)
CO2: 24 mmol/L (ref 22–32)
Calcium: 8.7 mg/dL — ABNORMAL LOW (ref 8.9–10.3)
Chloride: 103 mmol/L (ref 98–111)
Creatinine, Ser: 0.62 mg/dL (ref 0.44–1.00)
GFR calc non Af Amer: >60 mL/min (ref >60)
Glucose, Bld: 158 mg/dL — ABNORMAL HIGH (ref 70–99)
Potassium: 3.5 mmol/L (ref 3.5–5.1)
Sodium: 136 mmol/L (ref 135–145)

## 2020-05-03 MED ORDER — KETOROLAC TROMETHAMINE 30 MG/ML IJ SOLN
30.0000 mg | Freq: Once | INTRAMUSCULAR | Status: AC
  Administered 2020-05-03: 30 mg via INTRAMUSCULAR
  Filled 2020-05-03: qty 1

## 2020-05-03 NOTE — ED Notes (Signed)
See triage note, pt reports fatigue x4 days. Ambulatory to treatment room, NAD noted, RR even and unlabored.  C/o body aches

## 2020-05-03 NOTE — ED Provider Notes (Signed)
[facility] Emergency Department Provider Note ____________________________________________   First MD Initiated Contact with Patient 05/03/20 1657     (approximate)  I have reviewed the triage vital signs and the nursing notes.  HISTORY  Chief Complaint Fatigue   HPI Sandy Mason is a 44 y.o. femalewho presents to the ED for evaluation of fatigue.   Chart review indicates history of PTSD, borderline personality disorder and alcohol abuse. Patient reports receiving the first shot of Moderna COVID-19 vaccine 1 month ago, but not the second shot yet.  Patient reports 4 days of fatigue and diffuse myalgias.  She denies any other symptoms beyond this.  She is reporting diffuse myalgias throughout her body, without focal areas, causing 3/10 aching pain throughout that is constant for 4 days.  She reports concurrent sensation of fatigue.  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  She denies fevers, cough, shortness of breath, congestion, chest pain, abdominal pain, vomiting, diarrhea, dysuria or vaginal bleeding/discharge.  Reports tolerating p.o. intake and toileting at her baseline.  Denies syncope.    Past Medical History:  Diagnosis Date  . Alcohol use disorder severe. 12/18/2014  . Anxiety   . Borderline personality disorder (HCC) 12/18/2014  . CVA (cerebral infarction)   . Depression   . 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 Opioid use disorder, severe, dependence (HCC) 12/20/2014  . PTSD (post-traumatic stress disorder) 03/28/2015  . 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 Seizures (HCC)   . Suicide attempt by hanging [facility])     Patient Active Problem List   Diagnosis Date Noted  . Major depressive disorder, recurrent severe without psychotic features (HCC) 04/11/2017  . Salicylate overdose  04/09/2017  . AKI (acute kidney injury) (HCC) 04/03/2016  . Suicidal behavior 01/16/2016  . Alcohol intoxication (HCC)   . Severe episode of recurrent major depressive disorder, without psychotic features (HCC)   . Malingering 12/16/2015  . Major depressive disorder, recurrent episode, moderate (HCC) 12/12/2015  . Closed pelvic fracture (HCC) 10/31/2015  . Tobacco abuse 10/31/2015  . Tobacco use disorder 03/28/2015  . PTSD (post-traumatic stress disorder) 03/28/2015  . Stimulant use disorder (cocaine) 03/28/2015  . 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 Opioid use disorder, severe, dependence (HCC) 12/20/2014  . Alcohol use disorder severe. 12/18/2014  . Borderline personality disorder (HCC) 12/18/2014    Past Surgical History:  Procedure Laterality Date  . NO PAST SURGERIES      Prior to Admission medications   Medication Sig Start Date End Date Taking? Authorizing Provider  acetaminophen (TYLENOL) 325 MG tablet Take by mouth every 6 (six) hours as needed.  02/20/13   [provider]  gabapentin (NEURONTIN) 300 MG capsule Take 900 mg by mouth 3 (three) times daily.    [provider]  hydrOXYzine (ATARAX/VISTARIL) 25 MG tablet Take 25 mg by mouth 2 (two) times daily as needed. 11/23/19   [provider]  levonorgestrel (MIRENA) 20 MCG/24HR IUD 1 each by Intrauterine route once.    [provider]  mirtazapine (REMERON) 30 MG tablet Take 30 mg by mouth at bedtime.    [provider]  naloxone [facility]) nasal spray 4 mg/0.1 mL Nasal spray in case of heroin overdose again 03/15/19   Emily Filbert, MD  naproxen (NAPROSYN) 500 MG tablet Take 1 tablet (  500 mg total) by mouth 2 (two) times daily with a meal. 03/09/20 03/09/21  Cuthriell, Delorise Royals, PA-C  oxyCODONE-acetaminophen (PERCOCET) 5-325 MG tablet Take 1 tablet by mouth every 6 (six) hours as needed for severe pain. 02/22/20 02/21/21   Sharman Cheek, MD  pantoprazole (PROTONIX) 20 MG tablet Take 1 tablet (20 mg total) by mouth daily. 09/28/19 09/27/20  Jene Every, MD  pseudoephedrine (SUDAFED) 120 MG 12 hr tablet Take 1 tablet (120 mg total) by mouth 2 (two) times daily as needed for congestion. 02/27/20 02/26/21  Joni Reining, PA-C  REXULTI 2 MG TABS tablet Take 2 mg by mouth daily. 11/24/19   [provider]  sucralfate (CARAFATE) 1 g tablet Take 1 tablet (1 g total) by mouth 4 (four) times daily for 15 days. 09/28/19 12/29/19  Jene Every, MD  venlafaxine XR (EFFEXOR-XR) 150 MG 24 hr capsule Take 300 mg by mouth daily with breakfast.     [provider]    Allergies Depakote [valproic acid], Haldol [haloperidol lactate], Buspar [buspirone], Meloxicam, and Keflet [cephalexin]  Family History  Problem Relation Age of Onset  . Diabetes Father   . Dementia Mother   . Anxiety disorder Mother     Social History Social History   Tobacco Use  . Smoking status: Current Every Day Smoker    Packs/day: 0.00    Years: 20.00    Pack years: 0.00    Types: Cigarettes  . Smokeless tobacco: Never Used  . Tobacco comment: 1-2 cigarettes per day  Vaping Use  . Vaping Use: Never used  Substance Use Topics  . Alcohol use: Yes    Comment: socially  . Drug use: No    Types: Benzodiazepines, Hydrocodone    Review of Systems  Constitutional: No fever/chills.  Positive for fatigue Eyes: No visual changes. ENT: No sore throat. Cardiovascular: Denies chest pain. Respiratory: Denies shortness of breath. Gastrointestinal: No abdominal pain.  No nausea, no vomiting.  No diarrhea.  No constipation. Genitourinary: Negative for dysuria. Musculoskeletal: Negative for back pain.  Positive for myalgias Skin: Negative for rash. Neurological: Negative for headaches, focal weakness or numbness.  ____________________________________________   PHYSICAL EXAM:  VITAL SIGNS: Vitals:   05/03/20 1636 05/03/20 1700   BP: 126/88 134/78  Pulse: 82 89  Resp: 14   Temp: 99 F (37.2 C)   SpO2: 99% 99%      Constitutional: Alert and oriented. Well appearing and in no acute distress. Eyes: Conjunctivae are normal. PERRL. EOMI. Head: Atraumatic. Nose: No congestion/rhinnorhea. Mouth/Throat: Mucous membranes are moist.  Oropharynx non-erythematous. Neck: No stridor. No cervical spine tenderness to palpation. Cardiovascular: Normal rate, regular rhythm. Grossly normal heart sounds.  Good peripheral circulation. Respiratory: Normal respiratory effort.  No retractions. Lungs CTAB. Gastrointestinal: Soft , nondistended, nontender to palpation. No abdominal bruits. No CVA tenderness. Musculoskeletal: No lower extremity tenderness nor edema.  No joint effusions. No signs of acute trauma. Neurologic:  Normal speech and language. No gross focal neurologic deficits are appreciated. No gait instability noted. Cranial nerves II through XII intact 5/5 strength and sensation in all 4 extremities Skin:  Skin is warm, dry and intact. No rash noted. Psychiatric: Mood and affect are normal. Speech and behavior are normal.  ____________________________________________   LABS (all labs ordered are listed, but only abnormal results are displayed)  Labs Reviewed  BASIC METABOLIC PANEL - Abnormal; Notable for the following components:      Result Value   Glucose, Bld 158 (*)  Calcium 8.7 (*)    All other components within normal limits  CBC - Abnormal; Notable for the following components:   WBC 11.3 (*)    All other components within normal limits  URINALYSIS, COMPLETE (UACMP) WITH MICROSCOPIC - Abnormal; Notable for the following components:   Color, Urine COLORLESS (*)    APPearance CLEAR (*)    Specific Gravity, Urine 1.002 (*)    All other components within normal limits  RESPIRATORY PANEL BY RT PCR (FLU A&B, COVID)   ____________________________________________  12 Lead EKG  Sinus rhythm, rate of 77  bpm.  Normal axis and intervals.  No evidence of acute ischemia. ____________________________________________  RADIOLOGY  ED MD interpretation: 2 view CXR without evidence of acute cardiopulmonary pathology  Official radiology report(s): DG Chest 2 View  Result Date: 05/03/2020 CLINICAL DATA:  Fatigue and myalgias. EXAM: CHEST - 2 VIEW COMPARISON:  06/28/2019 FINDINGS: The cardiomediastinal contours are normal. Minor right infrahilar atelectasis. Pulmonary vasculature is normal. No consolidation, pleural effusion, or pneumothorax. No acute osseous abnormalities are seen. IMPRESSION: Minor right infrahilar atelectasis.  No evidence of pneumonia. Electronically Signed   By: Narda Rutherford M.D.   On: 05/03/2020 18:31    ____________________________________________   PROCEDURES and INTERVENTIONS  Procedure(s) performed (including Critical Care):  Procedures  Medications  ketorolac (TORADOL) 30 MG/ML injection 30 mg (30 mg Intramuscular Given 05/03/20 1820)    ____________________________________________   MDM / ED COURSE  44 year old woman with psychiatric history presents with isolated fatigue and myalgias without evidence of acute pathology and amenable to outpatient management.  Normal vital signs on room air.  Exam without evidence of acute pathology.  She is well-appearing without distress, neurovascular deficits or signs of trauma.  Blood work with isolated leukocytosis to 11 is only acute pathology, albeit very minor.  EKG is nonischemic and CXR has no infiltrates.  UA without infectious features.  Patient reports resolving symptoms after Toradol administration.  Her Covid/influenza swab was negative.  I see no evidence of acute pathology to warrant further work-up or management in the hospital.  Advised patient of possible viral syndrome and continue Tylenol/NSAIDs as an outpatient.  We discussed return precautions for the ED.  Patient medically stable for discharge  home.  Clinical Course as of May 04 1935  Tue May 03, 2020  1757 Patient ambulates to and from the restroom independently prior to my evaluation.  Steady gait and no distress.   [DS]  1930 Reassessed.  Patient reports resolving symptoms after Toradol administration.  Educated patient on reassuring work-up.  We discussed NSAIDs as an outpatient.  We discussed return precautions for the ED.   [DS]    Clinical Course User Index [DS] Delton Prairie, MD   ____________________________________________   FINAL CLINICAL IMPRESSION(S) / ED DIAGNOSES  Final diagnoses:  Weakness  Myalgia     ED Discharge Orders    None       Warnie Belair Katrinka Blazing   Note:  This document was prepared using Dragon voice recognition software and may include unintentional dictation errors.   Delton Prairie, MD 05/03/20 [phone]

## 2020-05-03 NOTE — Discharge Instructions (Signed)
Please take Tylenol and ibuprofen/Advil for your pain.  It is safe to take them together, or to alternate them every few hours.  Take up to 1000mg  of Tylenol at a time, up to 4 times per day.  Do not take more than 4000 mg of Tylenol in 24 hours.  For ibuprofen, take 400-600 mg, 4-5 times per day.  Return to the ED with any worsening symptoms

## 2020-05-03 NOTE — ED Notes (Signed)
Signature pad not working, pt verbalizes understnading of d/c instructions, denies questions or concerns

## 2020-05-03 NOTE — ED Triage Notes (Addendum)
Pt reports has chronic fatigue and has been asleep for 4 days. Pt also c/o generalized bodyaches. Pt denies other sx's.

## 2020-05-25 ENCOUNTER — Other Ambulatory Visit: Payer: Self-pay

## 2020-05-25 ENCOUNTER — Emergency Department
Admission: EM | Admit: 2020-05-25 | Discharge: 2020-05-25 | Disposition: A | Discharge status: Home / Self Care | Payer: Medicaid Other | Attending: Emergency Medicine | Admitting: Emergency Medicine

## 2020-05-25 DIAGNOSIS — W57XXXA Bitten or stung by nonvenomous insect and other nonvenomous arthropods, initial encounter: Secondary | ICD-10-CM | POA: Diagnosis not present

## 2020-05-25 DIAGNOSIS — F17210 Nicotine dependence, cigarettes, uncomplicated: Secondary | ICD-10-CM | POA: Insufficient documentation

## 2020-05-25 DIAGNOSIS — S80869A Insect bite (nonvenomous), unspecified lower leg, initial encounter: Secondary | ICD-10-CM

## 2020-05-25 DIAGNOSIS — Z79899 Other long term (current) drug therapy: Secondary | ICD-10-CM | POA: Insufficient documentation

## 2020-05-25 DIAGNOSIS — S80862A Insect bite (nonvenomous), left lower leg, initial encounter: Secondary | ICD-10-CM | POA: Diagnosis not present

## 2020-05-25 MED ORDER — FAMOTIDINE 20 MG PO TABS
40.0000 mg | ORAL_TABLET | Freq: Once | ORAL | Status: AC
  Administered 2020-05-25: 40 mg via ORAL
  Filled 2020-05-25: qty 2

## 2020-05-25 MED ORDER — DIPHENHYDRAMINE HCL 25 MG PO TABS: 25.0000 mg | ORAL_TABLET | Freq: Four times a day (QID) | ORAL | 0 refills | Status: DC | PRN

## 2020-05-25 MED ORDER — PREDNISONE 20 MG PO TABS
60.0000 mg | ORAL_TABLET | Freq: Once | ORAL | Status: AC
  Administered 2020-05-25: 60 mg via ORAL
  Filled 2020-05-25: qty 3

## 2020-05-25 MED ORDER — ACETAMINOPHEN 325 MG PO TABS
650.0000 mg | ORAL_TABLET | Freq: Once | ORAL | Status: AC
  Administered 2020-05-25: 650 mg via ORAL
  Filled 2020-05-25: qty 2

## 2020-05-25 MED ORDER — FAMOTIDINE 20 MG PO TABS: 20.0000 mg | ORAL_TABLET | Freq: Two times a day (BID) | ORAL | 1 refills | Status: DC

## 2020-05-25 MED ORDER — TRIAMCINOLONE ACETONIDE 0.5 % EX OINT: 1.0000 "application " | TOPICAL_OINTMENT | Freq: Two times a day (BID) | CUTANEOUS | 0 refills | Status: DC

## 2020-05-25 NOTE — ED Triage Notes (Signed)
Pt to ED for chief complaint of sting by yellow jacket on right upper thigh x1. Small red papule noted to right thigh area, no swelling at this time Pt reports sweating and fatigue  RR even and unlabored. No facial swelling noted.    Reports taking benadryl, ibuprofen and tylenol PTA.

## 2020-05-25 NOTE — ED Notes (Signed)
Pt reports at approx 1530 today she was stung on the right thih by a yellow jacket. Pt presents with rash approx 1.5 inches across to area, along with an abrasion off-center to the redness. Pt reports that her throat is sore, she feels sweaty, and reports some mild SOB.   Pt reports taking 2 benadryl, 400 mg ibuprofen, 1300 mg tylenol approx 30 min after. Pt mildly anxious. No prior history of reactions to stings. Pt denies n/v/d/abd pain   Pt states "I don't feel well"

## 2020-05-25 NOTE — ED Provider Notes (Signed)
Emergency Department Provider Note  ____________________________________________  Time seen: Approximately 7:58 PM  I have reviewed the triage vital signs and the nursing notes.   HISTORY  Chief Complaint Insect Bite   Historian Patient     HPI Sandy Mason is a 44 y.o. female presents to the emergency department after patient was stung by a yellow jacket.  Patient states that she took two Benadryl but has been experiencing some pharyngitis.  She denies chest tightness, shortness of breath or nausea and vomiting.  Patient states that she has experienced similar symptoms in the past after having yellowjacket stings.  She denies history of anaphylaxis.  She does not carry an EpiPen.   Past Medical History:  Diagnosis Date  . Alcohol use disorder severe. 12/18/2014  . Anxiety   . Borderline personality disorder (HCC) 12/18/2014  . CVA (cerebral infarction)   . Depression   . 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 Opioid use disorder, severe, dependence (HCC) 12/20/2014  . PTSD (post-traumatic stress disorder) 03/28/2015  . 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 Seizures (HCC)   . Suicide attempt by hanging [facility](HCC)      Immunizations up to date:  Yes.     Past Medical History:  Diagnosis Date  . Alcohol use disorder severe. 12/18/2014  . Anxiety   . Borderline personality disorder (HCC) 12/18/2014  . CVA (cerebral infarction)   . Depression   . 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 Opioid use disorder, severe, dependence (HCC) 12/20/2014  . PTSD (post-traumatic stress disorder) 03/28/2015  . 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 Seizures (HCC)   . Suicide attempt by hanging [facility](HCC)     Patient Active Problem List   Diagnosis Date Noted  . Major depressive disorder, recurrent severe  without psychotic features (HCC) 04/11/2017  . Salicylate overdose 04/09/2017  . AKI (acute kidney injury) (HCC) 04/03/2016  . Suicidal behavior 01/16/2016  . Alcohol intoxication (HCC)   . Severe episode of recurrent major depressive disorder, without psychotic features (HCC)   . Malingering 12/16/2015  . Major depressive disorder, recurrent episode, moderate (HCC) 12/12/2015  . Closed pelvic fracture (HCC) 10/31/2015  . Tobacco abuse 10/31/2015  . Tobacco use disorder 03/28/2015  . PTSD (post-traumatic stress disorder) 03/28/2015  . Stimulant use disorder (cocaine) 03/28/2015  . 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 Opioid use disorder, severe, dependence (HCC) 12/20/2014  . Alcohol use disorder severe. 12/18/2014  . Borderline personality disorder (HCC) 12/18/2014    Past Surgical History:  Procedure Laterality Date  . NO PAST SURGERIES      Prior to Admission medications   Medication Sig Start Date End Date Taking? Authorizing Provider  acetaminophen (TYLENOL) 325 MG tablet Take by mouth every 6 (six) hours as needed.  02/20/13   [provider]  diphenhydrAMINE (BENADRYL ALLERGY) 25 MG tablet Take 1 tablet (25 mg total) by mouth every 6 (six) hours as needed. 05/25/20   Orvil FeilWoods, Joory Gough M, PA-C  famotidine (PEPCID) 20 MG tablet Take 1 tablet (20 mg total) by mouth 2 (two) times daily. 05/25/20 05/25/21  Orvil FeilWoods, Laylani Pudwill M, PA-C  gabapentin (NEURONTIN) 300 MG capsule Take 900 mg by mouth 3 (three) times daily.    [provider]  hydrOXYzine (ATARAX/VISTARIL) 25 MG tablet Take 25 mg by mouth 2 (two) times daily as needed. 11/23/19  [provider]  levonorgestrel (MIRENA) 20 MCG/24HR IUD 1 each by Intrauterine route once.    [provider]  mirtazapine (REMERON) 30 MG tablet Take 30 mg by mouth at bedtime.    [provider]  naloxone [facility]) nasal spray 4 mg/0.1 mL Nasal spray in case of  heroin overdose again 03/15/19   Emily Filbert, MD  naproxen (NAPROSYN) 500 MG tablet Take 1 tablet (500 mg total) by mouth 2 (two) times daily with a meal. 03/09/20 03/09/21  Cuthriell, Delorise Royals, PA-C  oxyCODONE-acetaminophen (PERCOCET) 5-325 MG tablet Take 1 tablet by mouth every 6 (six) hours as needed for severe pain. 02/22/20 02/21/21  Sharman Cheek, MD  pantoprazole (PROTONIX) 20 MG tablet Take 1 tablet (20 mg total) by mouth daily. 09/28/19 09/27/20  Jene Every, MD  pseudoephedrine (SUDAFED) 120 MG 12 hr tablet Take 1 tablet (120 mg total) by mouth 2 (two) times daily as needed for congestion. 02/27/20 02/26/21  Joni Reining, PA-C  REXULTI 2 MG TABS tablet Take 2 mg by mouth daily. 11/24/19   [provider]  sucralfate (CARAFATE) 1 g tablet Take 1 tablet (1 g total) by mouth 4 (four) times daily for 15 days. 09/28/19 12/29/19  Jene Every, MD  triamcinolone ointment (KENALOG) 0.5 % Apply 1 application topically 2 (two) times daily. 05/25/20   Orvil Feil, PA-C  venlafaxine XR (EFFEXOR-XR) 150 MG 24 hr capsule Take 300 mg by mouth daily with breakfast.     [provider]    Allergies Depakote [valproic acid], Haldol [haloperidol lactate], Buspar [buspirone], Meloxicam, and Keflet [cephalexin]  Family History  Problem Relation Age of Onset  . Diabetes Father   . Dementia Mother   . Anxiety disorder Mother     Social History Social History   Tobacco Use  . Smoking status: Current Every Day Smoker    Packs/day: 0.00    Years: 20.00    Pack years: 0.00    Types: Cigarettes  . Smokeless tobacco: Never Used  . Tobacco comment: 1-2 cigarettes per day  Vaping Use  . Vaping Use: Never used  Substance Use Topics  . Alcohol use: Yes    Comment: socially  . Drug use: No    Types: Benzodiazepines, Hydrocodone     Review of Systems  Constitutional: No fever/chills Eyes:  No discharge ENT: No upper respiratory complaints. Respiratory: no cough. No  SOB/ use of accessory muscles to breath Gastrointestinal:   No nausea, no vomiting.  No diarrhea.  No constipation. Musculoskeletal: Negative for musculoskeletal pain. Skin: Patient has yellow jacket sting.    ____________________________________________   PHYSICAL EXAM:  VITAL SIGNS: ED Triage Vitals  Enc Vitals Group     BP 05/25/20 1832 (!) 143/82     Pulse Rate 05/25/20 1832 90     Resp 05/25/20 1832 17     Temp 05/25/20 1832 98.6 F (37 C)     Temp Source 05/25/20 1832 Oral     SpO2 05/25/20 1832 100 %     Weight 05/25/20 1832 220 lb (99.8 kg)     Height 05/25/20 1832 5\' 8"  (1.727 m)     Head Circumference --      Peak Flow --      Pain Score 05/25/20 1843 10     Pain Loc --      Pain Edu? --      Excl. in GC? --      Constitutional: Alert and oriented. Well appearing  and in no acute distress. Eyes: Conjunctivae are normal. PERRL. EOMI. Head: Atraumatic. ENT:      Ears: TMs are pearly.       Nose: No congestion/rhinnorhea.      Mouth/Throat: Mucous membranes are moist.  Neck: No stridor. Full range of motion.  Cardiovascular: Normal rate, regular rhythm. Normal S1 and S2.  Good peripheral circulation. Respiratory: Normal respiratory effort without tachypnea or retractions. Lungs CTAB. Good air entry to the bases with no decreased or absent breath sounds Gastrointestinal: Bowel sounds x 4 quadrants. Soft and nontender to palpation. No guarding or rigidity. No distention. Musculoskeletal: Full range of motion to all extremities. No obvious deformities noted Neurologic:  Normal for age. No gross focal neurologic deficits are appreciated.  Skin: Patient has yellowjacket sting along right upper thigh. Psychiatric: Mood and affect are normal for age. Speech and behavior are normal.   ____________________________________________   LABS (all labs ordered are listed, but only abnormal results are displayed)  Labs Reviewed - No data to  display ____________________________________________  EKG   ____________________________________________  RADIOLOGY   No results found.  ____________________________________________    PROCEDURES  Procedure(s) performed:     Procedures     Medications  predniSONE (DELTASONE) tablet 60 mg (60 mg Oral Given 05/25/20 2047)  famotidine (PEPCID) tablet 40 mg (40 mg Oral Given 05/25/20 2047)  acetaminophen (TYLENOL) tablet 650 mg (650 mg Oral Given 05/25/20 2158)     ____________________________________________   INITIAL IMPRESSION / ASSESSMENT AND PLAN / ED COURSE  Pertinent labs & imaging results that were available during my care of the patient were reviewed by me and considered in my medical decision making (see chart for details).      Assessment and plan: Yellow jacket sting:  44 year old female presents to the emergency department with yellowjacket sting along the right upper thigh.  Patient was given a dose of prednisone in the emergency department as well as Pepcid.  She was observed over the course of almost 4 hours.  Patient states that her pharyngitis improved significantly.  Patient was discharged with Benadryl and Pepcid as well as topical triamcinolone.  Return precautions were given to return with new or worsening symptoms.   ____________________________________________  FINAL CLINICAL IMPRESSION(S) / ED DIAGNOSES  Final diagnoses:  Insect bite of lower leg, unspecified laterality, initial encounter      NEW MEDICATIONS STARTED DURING THIS VISIT:  ED Discharge Orders         Ordered    diphenhydrAMINE (BENADRYL ALLERGY) 25 MG tablet  Every 6 hours PRN,   Status:  Discontinued        05/25/20 2149    famotidine (PEPCID) 20 MG tablet  2 times daily,   Status:  Discontinued        05/25/20 2149    triamcinolone ointment (KENALOG) 0.5 %  2 times daily,   Status:  Discontinued        05/25/20 2149    diphenhydrAMINE (BENADRYL ALLERGY) 25 MG  tablet  Every 6 hours PRN        05/25/20 2158    famotidine (PEPCID) 20 MG tablet  2 times daily        05/25/20 2158    triamcinolone ointment (KENALOG) 0.5 %  2 times daily        05/25/20 2158              This chart was dictated using voice recognition software/Dragon. Despite best efforts to proofread, errors can occur  which can change the meaning. Any change was purely unintentional.     Gasper Lloyd 05/25/20 2159    Chesley Noon, MD 05/27/20 [phone]

## 2020-05-25 NOTE — Discharge Instructions (Addendum)
Take Benadryl every 8 hours for the next 3 days. Take Pepcid twice daily for the next 3 days.

## 2020-05-30 DIAGNOSIS — F909 Attention-deficit hyperactivity disorder, unspecified type: Secondary | ICD-10-CM

## 2020-05-30 HISTORY — DX: Attention-deficit hyperactivity disorder, unspecified type: F90.9

## 2020-06-01 ENCOUNTER — Other Ambulatory Visit: Payer: Self-pay

## 2020-06-01 ENCOUNTER — Emergency Department
Admission: EM | Admit: 2020-06-01 | Discharge: 2020-06-01 | Disposition: A | Discharge status: Home / Self Care | Payer: Medicaid Other | Attending: Emergency Medicine | Admitting: Emergency Medicine

## 2020-06-01 ENCOUNTER — Emergency Department: Payer: Medicaid Other

## 2020-06-01 DIAGNOSIS — F17210 Nicotine dependence, cigarettes, uncomplicated: Secondary | ICD-10-CM | POA: Insufficient documentation

## 2020-06-01 DIAGNOSIS — M25512 Pain in left shoulder: Secondary | ICD-10-CM | POA: Insufficient documentation

## 2020-06-01 DIAGNOSIS — W109XXA Fall (on) (from) unspecified stairs and steps, initial encounter: Secondary | ICD-10-CM | POA: Diagnosis not present

## 2020-06-01 MED ORDER — ONDANSETRON 4 MG PO TBDP: 4.0000 mg | ORAL_TABLET | Freq: Three times a day (TID) | ORAL | 0 refills | Status: AC | PRN

## 2020-06-01 MED ORDER — ONDANSETRON 4 MG PO TBDP: 4.0000 mg | ORAL_TABLET | Freq: Three times a day (TID) | ORAL | 0 refills | Status: DC | PRN

## 2020-06-01 MED ORDER — HYDROCODONE-ACETAMINOPHEN 5-325 MG PO TABS
1.0000 | ORAL_TABLET | Freq: Four times a day (QID) | ORAL | 0 refills | Status: AC | PRN
Start: 2020-06-01 — End: 2020-06-03

## 2020-06-01 MED ORDER — HYDROCODONE-ACETAMINOPHEN 5-325 MG PO TABS
1.0000 | ORAL_TABLET | Freq: Once | ORAL | Status: AC
  Administered 2020-06-01: 1 via ORAL
  Filled 2020-06-01: qty 1

## 2020-06-01 MED ORDER — HYDROCODONE-ACETAMINOPHEN 5-325 MG PO TABS
1.0000 | ORAL_TABLET | Freq: Four times a day (QID) | ORAL | 0 refills | Status: DC | PRN
Start: 2020-06-01 — End: 2020-06-01

## 2020-06-01 MED ORDER — ONDANSETRON 4 MG PO TBDP
4.0000 mg | ORAL_TABLET | Freq: Once | ORAL | Status: AC
  Administered 2020-06-01: 4 mg via ORAL
  Filled 2020-06-01: qty 1

## 2020-06-01 NOTE — ED Triage Notes (Signed)
Pt comes POV with left shoulder pain after falling down some stairs. Tried to go to urgent care but they wouldn't take her due to money issues. Has had some chronic issues with her left shoulder but "hasn't had time to go to the doctor".

## 2020-06-01 NOTE — ED Provider Notes (Signed)
Emergency Department Provider Note  ____________________________________________  Time seen: Approximately 6:29 PM  I have reviewed the triage vital signs and the nursing notes.   HISTORY  Chief Complaint Shoulder Pain   Historian Patient    HPI Sandy Mason is a 44 y.o. female presents to the emergency department with acute left shoulder pain after a mechanical fall.  Patient denies hitting her head or her neck.  No numbness or tingling in the upper and lower extremities.  No chest pain, chest tightness or abdominal pain.  Patient states that pain is worsened with attempted overhead movements and relieved with rest.   Past Medical History:  Diagnosis Date  . Alcohol use disorder severe. 12/18/2014  . Anxiety   . Borderline personality disorder (HCC) 12/18/2014  . CVA (cerebral infarction)   . Depression   . 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 Opioid use disorder, severe, dependence (HCC) 12/20/2014  . PTSD (post-traumatic stress disorder) 03/28/2015  . 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 Seizures (HCC)   . Suicide attempt by hanging [facility](HCC)      Immunizations up to date:  Yes.     Past Medical History:  Diagnosis Date  . Alcohol use disorder severe. 12/18/2014  . Anxiety   . Borderline personality disorder (HCC) 12/18/2014  . CVA (cerebral infarction)   . Depression   . 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 Opioid use disorder, severe, dependence (HCC) 12/20/2014  . PTSD (post-traumatic stress disorder) 03/28/2015  . 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 Seizures (HCC)   . Suicide attempt by hanging [facility](HCC)     Patient Active Problem List   Diagnosis Date Noted  . Major depressive disorder, recurrent severe without psychotic features (HCC) 04/11/2017  . Salicylate overdose  04/09/2017  . AKI (acute kidney injury) (HCC) 04/03/2016  . Suicidal behavior 01/16/2016  . Alcohol intoxication (HCC)   . Severe episode of recurrent major depressive disorder, without psychotic features (HCC)   . Malingering 12/16/2015  . Major depressive disorder, recurrent episode, moderate (HCC) 12/12/2015  . Closed pelvic fracture (HCC) 10/31/2015  . Tobacco abuse 10/31/2015  . Tobacco use disorder 03/28/2015  . PTSD (post-traumatic stress disorder) 03/28/2015  . Stimulant use disorder (cocaine) 03/28/2015  . 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 Opioid use disorder, severe, dependence (HCC) 12/20/2014  . Alcohol use disorder severe. 12/18/2014  . Borderline personality disorder (HCC) 12/18/2014    Past Surgical History:  Procedure Laterality Date  . NO PAST SURGERIES      Prior to Admission medications   Medication Sig Start Date End Date Taking? Authorizing Provider  acetaminophen (TYLENOL) 325 MG tablet Take by mouth every 6 (six) hours as needed.  02/20/13   [provider]  diphenhydrAMINE (BENADRYL ALLERGY) 25 MG tablet Take 1 tablet (25 mg total) by mouth every 6 (six) hours as needed. 05/25/20   Orvil FeilWoods, Zniya Cottone M, PA-C  famotidine (PEPCID) 20 MG tablet Take 1 tablet (20 mg total) by mouth 2 (two) times daily. 05/25/20 05/25/21  Orvil FeilWoods, Alf Doyle M, PA-C  gabapentin (NEURONTIN) 300 MG capsule Take 900 mg by mouth 3 (three) times daily.    [provider]  HYDROcodone-acetaminophen (NORCO) 5-325 MG tablet Take 1 tablet by mouth every 6 (six) hours as needed for up to 2 days for moderate pain. 06/01/20 06/03/20  Orvil FeilWoods, Blimi Godby M,  PA-C  hydrOXYzine (ATARAX/VISTARIL) 25 MG tablet Take 25 mg by mouth 2 (two) times daily as needed. 11/23/19   [provider]  levonorgestrel (MIRENA) 20 MCG/24HR IUD 1 each by Intrauterine route once.    [provider]  mirtazapine (REMERON) 30 MG tablet Take 30 mg by mouth  at bedtime.    [provider]  naloxone [facility]) nasal spray 4 mg/0.1 mL Nasal spray in case of heroin overdose again 03/15/19   Emily Filbert, MD  naproxen (NAPROSYN) 500 MG tablet Take 1 tablet (500 mg total) by mouth 2 (two) times daily with a meal. 03/09/20 03/09/21  Cuthriell, Delorise Royals, PA-C  ondansetron (ZOFRAN ODT) 4 MG disintegrating tablet Take 1 tablet (4 mg total) by mouth every 8 (eight) hours as needed for up to 5 days. 06/01/20 06/06/20  Orvil Feil, PA-C  oxyCODONE-acetaminophen (PERCOCET) 5-325 MG tablet Take 1 tablet by mouth every 6 (six) hours as needed for severe pain. 02/22/20 02/21/21  Sharman Cheek, MD  pantoprazole (PROTONIX) 20 MG tablet Take 1 tablet (20 mg total) by mouth daily. 09/28/19 09/27/20  Jene Every, MD  pseudoephedrine (SUDAFED) 120 MG 12 hr tablet Take 1 tablet (120 mg total) by mouth 2 (two) times daily as needed for congestion. 02/27/20 02/26/21  Joni Reining, PA-C  REXULTI 2 MG TABS tablet Take 2 mg by mouth daily. 11/24/19   [provider]  sucralfate (CARAFATE) 1 g tablet Take 1 tablet (1 g total) by mouth 4 (four) times daily for 15 days. 09/28/19 12/29/19  Jene Every, MD  triamcinolone ointment (KENALOG) 0.5 % Apply 1 application topically 2 (two) times daily. 05/25/20   Orvil Feil, PA-C  venlafaxine XR (EFFEXOR-XR) 150 MG 24 hr capsule Take 300 mg by mouth daily with breakfast.     [provider]    Allergies Depakote [valproic acid], Haldol [haloperidol lactate], Buspar [buspirone], Meloxicam, and Keflet [cephalexin]  Family History  Problem Relation Age of Onset  . Diabetes Father   . Dementia Mother   . Anxiety disorder Mother     Social History Social History   Tobacco Use  . Smoking status: Current Every Day Smoker    Packs/day: 0.00    Years: 20.00    Pack years: 0.00    Types: Cigarettes  . Smokeless tobacco: Never Used  . Tobacco comment: 1-2 cigarettes per day  Vaping Use  . Vaping  Use: Never used  Substance Use Topics  . Alcohol use: Yes    Comment: socially  . Drug use: No    Types: Benzodiazepines, Hydrocodone     Review of Systems  Constitutional: No fever/chills Eyes:  No discharge ENT: No upper respiratory complaints. Respiratory: no cough. No SOB/ use of accessory muscles to breath Gastrointestinal:   No nausea, no vomiting.  No diarrhea.  No constipation. Musculoskeletal: Patient has left shoulder pain.  Skin: Negative for rash, abrasions, lacerations, ecchymosis.    ____________________________________________   PHYSICAL EXAM:  VITAL SIGNS: ED Triage Vitals  Enc Vitals Group     BP 06/01/20 1653 (!) 161/84     Pulse Rate 06/01/20 1653 (!) 103     Resp 06/01/20 1653 18     Temp 06/01/20 1653 98.1 F (36.7 C)     Temp Source 06/01/20 1653 Oral     SpO2 06/01/20 1653 98 %     Weight 06/01/20 1654 218 lb 4.1 oz (99 kg)     Height 06/01/20 1654 5\' 8"  (1.727  m)     Head Circumference --      Peak Flow --      Pain Score 06/01/20 1654 8     Pain Loc --      Pain Edu? --      Excl. in GC? --      Constitutional: Alert and oriented. Well appearing and in no acute distress. Eyes: Conjunctivae are normal. PERRL. EOMI. Head: Atraumatic. Cardiovascular: Normal rate, regular rhythm. Normal S1 and S2.  Good peripheral circulation. Respiratory: Normal respiratory effort without tachypnea or retractions. Lungs CTAB. Good air entry to the bases with no decreased or absent breath sounds Gastrointestinal: Bowel sounds x 4 quadrants. Soft and nontender to palpation. No guarding or rigidity. No distention. Musculoskeletal: Patient performs limited range of motion of the left shoulder, likely due to pain.  Palpable radial pulse, left.  Capillary refill less than 2 seconds on the left. Neurologic:  Normal for age. No gross focal neurologic deficits are appreciated.  Skin:  Skin is warm, dry and intact. No rash noted. Psychiatric: Mood and affect are  normal for age. Speech and behavior are normal.   ____________________________________________   LABS (all labs ordered are listed, but only abnormal results are displayed)  Labs Reviewed - No data to display ____________________________________________  EKG   ____________________________________________  RADIOLOGY Geraldo Pitter, personally viewed and evaluated these images (plain radiographs) as part of my medical decision making, as well as reviewing the written report by the radiologist.  DG Shoulder Left  Result Date: 06/01/2020 CLINICAL DATA:  Left shoulder pain after fall down stairs. EXAM: LEFT SHOULDER - 2+ VIEW COMPARISON:  None. FINDINGS: There is no evidence of fracture or dislocation. There is no evidence of arthropathy or other focal bone abnormality. Soft tissues are unremarkable. No fracture of the included left ribs. IMPRESSION: Negative radiographs of the left shoulder. Electronically Signed   By: Narda Rutherford M.D.   On: 06/01/2020 18:00    ____________________________________________    PROCEDURES  Procedure(s) performed:     Procedures     Medications  HYDROcodone-acetaminophen (NORCO/VICODIN) 5-325 MG per tablet 1 tablet (has no administration in time range)  ondansetron (ZOFRAN-ODT) disintegrating tablet 4 mg (has no administration in time range)     ____________________________________________   INITIAL IMPRESSION / ASSESSMENT AND PLAN / ED COURSE  Pertinent labs & imaging results that were available during my care of the patient were reviewed by me and considered in my medical decision making (see chart for details).      Assessment and Plan:  Left shoulder pain:  44 year old female presents to the emergency department with acute left shoulder pain after mechanical fall.  Patient was hypertensive and mildly tachycardic at triage.  On physical exam, patient limited range of motion at the left shoulder.  I reviewed x-rays of left  shoulder and no bony abnormalities were visualized.  Patient was placed in a sling and advised to follow-up with orthopedics.  She was discharged with a 2-day course of Norco.  Return precautions were given to return with new or worsening symptoms.    ____________________________________________  FINAL CLINICAL IMPRESSION(S) / ED DIAGNOSES  Final diagnoses:  Acute pain of left shoulder      NEW MEDICATIONS STARTED DURING THIS VISIT:  ED Discharge Orders         Ordered    HYDROcodone-acetaminophen (NORCO) 5-325 MG tablet  Every 6 hours PRN,   Status:  Discontinued        06/01/20  1824    ondansetron (ZOFRAN ODT) 4 MG disintegrating tablet  Every 8 hours PRN,   Status:  Discontinued        06/01/20 1824    HYDROcodone-acetaminophen (NORCO) 5-325 MG tablet  Every 6 hours PRN,   Status:  Discontinued        06/01/20 1825    ondansetron (ZOFRAN ODT) 4 MG disintegrating tablet  Every 8 hours PRN,   Status:  Discontinued        06/01/20 1825    HYDROcodone-acetaminophen (NORCO) 5-325 MG tablet  Every 6 hours PRN        06/01/20 1826    ondansetron (ZOFRAN ODT) 4 MG disintegrating tablet  Every 8 hours PRN        06/01/20 1826              This chart was dictated using voice recognition software/Dragon. Despite best efforts to proofread, errors can occur which can change the meaning. Any change was purely unintentional.     Orvil Feil, PA-C 06/01/20 1836    Gilles Chiquito, MD 06/01/20 2022

## 2020-06-01 NOTE — Discharge Instructions (Addendum)
You can take Norco for pain. Please follow up with ortho, Dr. Odis Luster.

## 2020-09-22 ENCOUNTER — Emergency Department
Admission: EM | Admit: 2020-09-22 | Discharge: 2020-09-22 | Disposition: A | Discharge status: Home / Self Care | Payer: Medicaid Other | Attending: Emergency Medicine | Admitting: Emergency Medicine

## 2020-09-22 ENCOUNTER — Other Ambulatory Visit: Payer: Self-pay

## 2020-09-22 ENCOUNTER — Other Ambulatory Visit: Payer: Self-pay | Admitting: Emergency Medicine

## 2020-09-22 DIAGNOSIS — F17210 Nicotine dependence, cigarettes, uncomplicated: Secondary | ICD-10-CM | POA: Diagnosis not present

## 2020-09-22 DIAGNOSIS — R1030 Lower abdominal pain, unspecified: Secondary | ICD-10-CM | POA: Diagnosis present

## 2020-09-22 DIAGNOSIS — N3091 Cystitis, unspecified with hematuria: Secondary | ICD-10-CM | POA: Diagnosis not present

## 2020-09-22 LAB — URINALYSIS, COMPLETE (UACMP) WITH MICROSCOPIC
Bacteria, UA: NONE SEEN
Bilirubin Urine: NEGATIVE
Glucose, UA: NEGATIVE mg/dL
Ketones, ur: NEGATIVE mg/dL
Nitrite: NEGATIVE
Protein, ur: 30 mg/dL — AB
Specific Gravity, Urine: 1.033 — ABNORMAL HIGH (ref 1.005–1.030)
WBC, UA: >50 WBC/hpf — ABNORMAL HIGH (ref 0–5)
pH: 5 (ref 5.0–8.0)

## 2020-09-22 LAB — COMPREHENSIVE METABOLIC PANEL
ALT: 23 U/L (ref 0–44)
AST: 21 U/L (ref 15–41)
Albumin: 4 g/dL (ref 3.5–5.0)
Alkaline Phosphatase: 84 U/L (ref 38–126)
Anion gap: 12 (ref 5–15)
BUN: 13 mg/dL (ref 6–20)
CO2: 21 mmol/L — ABNORMAL LOW (ref 22–32)
Calcium: 9.1 mg/dL (ref 8.9–10.3)
Chloride: 100 mmol/L (ref 98–111)
Creatinine, Ser: 0.83 mg/dL (ref 0.44–1.00)
GFR, Estimated: >60 mL/min (ref >60)
Glucose, Bld: 152 mg/dL — ABNORMAL HIGH (ref 70–99)
Potassium: 3.5 mmol/L (ref 3.5–5.1)
Sodium: 133 mmol/L — ABNORMAL LOW (ref 135–145)
Total Bilirubin: 0.6 mg/dL (ref 0.3–1.2)
Total Protein: 8.3 g/dL — ABNORMAL HIGH (ref 6.5–8.1)

## 2020-09-22 LAB — CBC
HCT: 37.7 % (ref 36.0–46.0)
Hemoglobin: 11.9 g/dL — ABNORMAL LOW (ref 12.0–15.0)
MCH: 28.1 pg (ref 26.0–34.0)
MCHC: 31.6 g/dL (ref 30.0–36.0)
MCV: 89.1 fL (ref 80.0–100.0)
Platelets: 362 10*3/uL (ref 150–400)
RBC: 4.23 MIL/uL (ref 3.87–5.11)
RDW: 14.3 % (ref 11.5–15.5)
WBC: 16 10*3/uL — ABNORMAL HIGH (ref 4.0–10.5)
nRBC: 0 % (ref 0.0–0.2)

## 2020-09-22 LAB — CHLAMYDIA/NGC RT PCR (ARMC ONLY)
Chlamydia Tr: NOT DETECTED
N gonorrhoeae: DETECTED — AB

## 2020-09-22 LAB — LIPASE, BLOOD: Lipase: 52 U/L — ABNORMAL HIGH (ref 11–51)

## 2020-09-22 LAB — POC URINE PREG, ED: Preg Test, Ur: NEGATIVE

## 2020-09-22 MED ORDER — CIPROFLOXACIN HCL 500 MG PO TABS
500.0000 mg | ORAL_TABLET | Freq: Once | ORAL | Status: AC
  Administered 2020-09-22: 500 mg via ORAL
  Filled 2020-09-22: qty 1

## 2020-09-22 MED ORDER — CIPROFLOXACIN HCL 500 MG PO TABS: 500.0000 mg | ORAL_TABLET | Freq: Two times a day (BID) | ORAL | 0 refills | Status: DC

## 2020-09-22 MED ORDER — ONDANSETRON 4 MG PO TBDP: 4.0000 mg | ORAL_TABLET | Freq: Three times a day (TID) | ORAL | 0 refills | Status: DC | PRN

## 2020-09-22 MED ORDER — ONDANSETRON 4 MG PO TBDP: 4.0000 mg | ORAL_TABLET | Freq: Once | ORAL | Status: DC | PRN

## 2020-09-22 MED ORDER — PHENAZOPYRIDINE HCL 200 MG PO TABS: 200.0000 mg | ORAL_TABLET | Freq: Three times a day (TID) | ORAL | 0 refills | Status: DC | PRN

## 2020-09-22 MED ORDER — OXYCODONE-ACETAMINOPHEN 5-325 MG PO TABS
2.0000 | ORAL_TABLET | Freq: Once | ORAL | Status: AC
  Administered 2020-09-22: 2 via ORAL
  Filled 2020-09-22: qty 2

## 2020-09-22 MED ORDER — IBUPROFEN 600 MG PO TABS: 600.0000 mg | ORAL_TABLET | Freq: Three times a day (TID) | ORAL | 0 refills | Status: DC | PRN

## 2020-09-22 MED ORDER — PHENAZOPYRIDINE HCL 200 MG PO TABS: 200.0000 mg | ORAL_TABLET | Freq: Three times a day (TID) | ORAL | 0 refills | Status: AC | PRN

## 2020-09-22 MED ORDER — PHENAZOPYRIDINE HCL 200 MG PO TABS
200.0000 mg | ORAL_TABLET | Freq: Once | ORAL | Status: AC
  Administered 2020-09-22: 200 mg via ORAL
  Filled 2020-09-22: qty 1

## 2020-09-22 NOTE — ED Triage Notes (Signed)
Please see first nurse note. Pt describes a pulling pressure sensation/pain from middle upper abd to pubic region. States normally does not have a period, last period 14 years ago. Reports nausea. NAD noted at this time.

## 2020-09-22 NOTE — ED Provider Notes (Addendum)
[facility] Emergency Department Provider Note  ____________________________________________   Event Date/Time   First MD Initiated Contact with Patient 09/22/20 1623     (approximate)  I have reviewed the triage vital signs and the nursing notes.   HISTORY  Chief Complaint Abdominal Pain and Vaginal Bleeding    HPI Sandy Mason is a 45 y.o. female with history of borderline personality disorder, chronic substance abuse,  here with suprapubic pain and blood after wiping after urinating.  The patient states that approximately 3 weeks ago, she developed aching, gnawing, suprapubic pain with urinary frequency and urgency.  She felt like she had a UTI.  She states that she could not miss work so she tried to hydrate and drink cranberry juice to fix it.  She states that she had initially felt better.  However, over the last several days, she has had return of aching, sharp, stabbing, suprapubic pain.  It is constant.  It does seem slightly worse with urinating and over the last day, she has noticed blood after urinating when she wipes.  She feels like there was a small clot in her urine as well.  Denies any overt vaginal bleeding.  No vaginal discharge.  No dyspareunia.  Denies concerns for STD/STIs.  No other complaints.  No flank pain, nausea, vomiting.  Denies any symptoms similar to her prior kidney stones.       Past Medical History:  Diagnosis Date  . Adult ADHD 05/2020  . Alcohol use disorder severe. 12/18/2014  . Anxiety   . Borderline personality disorder (HCC) 12/18/2014  . CVA (cerebral infarction)   . Depression   . 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 Opioid use disorder, severe, dependence (HCC) 12/20/2014  . PTSD (post-traumatic stress disorder) 03/28/2015  . 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 Seizures (HCC)   . Suicide attempt by hanging [facility])      Patient Active Problem List   Diagnosis Date Noted  . Major depressive disorder, recurrent severe without psychotic features (HCC) 04/11/2017  . Salicylate overdose 04/09/2017  . AKI (acute kidney injury) (HCC) 04/03/2016  . Suicidal behavior 01/16/2016  . Alcohol intoxication (HCC)   . Severe episode of recurrent major depressive disorder, without psychotic features (HCC)   . Malingering 12/16/2015  . Major depressive disorder, recurrent episode, moderate (HCC) 12/12/2015  . Closed pelvic fracture (HCC) 10/31/2015  . Tobacco abuse 10/31/2015  . Tobacco use disorder 03/28/2015  . PTSD (post-traumatic stress disorder) 03/28/2015  . Stimulant use disorder (cocaine) 03/28/2015  . 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 Opioid use disorder, severe, dependence (HCC) 12/20/2014  . Alcohol use disorder severe. 12/18/2014  . Borderline personality disorder (HCC) 12/18/2014    Past Surgical History:  Procedure Laterality Date  . NO PAST SURGERIES      Prior to Admission medications   Medication Sig Start Date End Date Taking? Authorizing Provider  ciprofloxacin (CIPRO) 500 MG tablet Take 1 tablet (500 mg total) by mouth 2 (two) times daily for 7 days. 09/22/20 09/29/20 Yes Shaune Pollack, MD  ibuprofen (ADVIL) 600 MG tablet Take 1 tablet (600 mg total) by mouth every 8 (eight) hours as needed for moderate pain. 09/22/20  Yes Shaune Pollack, MD  ondansetron (ZOFRAN ODT) 4 MG disintegrating tablet Take 1 tablet (4 mg total) by mouth every 8 (eight) hours as needed for nausea or vomiting. 09/22/20  Yes Erma Heritage,  Sheria Lang, MD  phenazopyridine (PYRIDIUM) 200 MG tablet Take 1 tablet (200 mg total) by mouth 3 (three) times daily as needed for up to 3 days for pain. 09/22/20 09/25/20 Yes Shaune Pollack, MD  acetaminophen (TYLENOL) 325 MG tablet Take by mouth every 6 (six) hours as needed.  02/20/13   [provider]  diphenhydrAMINE (BENADRYL  ALLERGY) 25 MG tablet Take 1 tablet (25 mg total) by mouth every 6 (six) hours as needed. 05/25/20   Orvil Feil, PA-C  famotidine (PEPCID) 20 MG tablet Take 1 tablet (20 mg total) by mouth 2 (two) times daily. 05/25/20 05/25/21  Orvil Feil, PA-C  gabapentin (NEURONTIN) 300 MG capsule Take 900 mg by mouth 3 (three) times daily.    [provider]  hydrOXYzine (ATARAX/VISTARIL) 25 MG tablet Take 25 mg by mouth 2 (two) times daily as needed. 11/23/19   [provider]  levonorgestrel (MIRENA) 20 MCG/24HR IUD 1 each by Intrauterine route once.    [provider]  mirtazapine (REMERON) 30 MG tablet Take 30 mg by mouth at bedtime.    [provider]  naloxone [facility]) nasal spray 4 mg/0.1 mL Nasal spray in case of heroin overdose again 03/15/19   Emily Filbert, MD  naproxen (NAPROSYN) 500 MG tablet Take 1 tablet (500 mg total) by mouth 2 (two) times daily with a meal. 03/09/20 03/09/21  Cuthriell, Delorise Royals, PA-C  oxyCODONE-acetaminophen (PERCOCET) 5-325 MG tablet Take 1 tablet by mouth every 6 (six) hours as needed for severe pain. 02/22/20 02/21/21  Sharman Cheek, MD  pantoprazole (PROTONIX) 20 MG tablet Take 1 tablet (20 mg total) by mouth daily. 09/28/19 09/27/20  Jene Every, MD  pseudoephedrine (SUDAFED) 120 MG 12 hr tablet Take 1 tablet (120 mg total) by mouth 2 (two) times daily as needed for congestion. 02/27/20 02/26/21  Joni Reining, PA-C  REXULTI 2 MG TABS tablet Take 2 mg by mouth daily. 11/24/19   [provider]  sucralfate (CARAFATE) 1 g tablet Take 1 tablet (1 g total) by mouth 4 (four) times daily for 15 days. 09/28/19 12/29/19  Jene Every, MD  triamcinolone ointment (KENALOG) 0.5 % Apply 1 application topically 2 (two) times daily. 05/25/20   Orvil Feil, PA-C  venlafaxine XR (EFFEXOR-XR) 150 MG 24 hr capsule Take 300 mg by mouth daily with breakfast.     [provider]    Allergies Depakote [valproic acid],  Haldol [haloperidol lactate], Buspar [buspirone], Meloxicam, and Keflet [cephalexin]  Family History  Problem Relation Age of Onset  . Diabetes Father   . Dementia Mother   . Anxiety disorder Mother     Social History Social History   Tobacco Use  . Smoking status: Current Every Day Smoker    Packs/day: 0.00    Years: 20.00    Pack years: 0.00    Types: Cigarettes  . Smokeless tobacco: Never Used  . Tobacco comment: 1-2 cigarettes per day  Vaping Use  . Vaping Use: Never used  Substance Use Topics  . Alcohol use: Yes    Comment: socially  . Drug use: No    Types: Benzodiazepines, Hydrocodone    Review of Systems  Review of Systems  Constitutional: Positive for fatigue. Negative for fever.  HENT: Negative for congestion and sore throat.   Eyes: Negative for visual disturbance.  Respiratory: Negative for cough and shortness of breath.   Cardiovascular: Negative for chest pain.  Gastrointestinal: Positive for abdominal pain. Negative for diarrhea, nausea  and vomiting.  Genitourinary: Positive for hematuria and urgency. Negative for flank pain.  Musculoskeletal: Negative for back pain and neck pain.  Skin: Negative for rash and wound.  Neurological: Negative for weakness.  All other systems reviewed and are negative.    ____________________________________________  PHYSICAL EXAM:      VITAL SIGNS: ED Triage Vitals  Enc Vitals Group     BP 09/22/20 1356 117/62     Pulse Rate 09/22/20 1356 94     Resp 09/22/20 1356 17     Temp 09/22/20 1356 98.1 F (36.7 C)     Temp Source 09/22/20 1356 Oral     SpO2 09/22/20 1356 100 %     Weight 09/22/20 1357 220 lb (99.8 kg)     Height 09/22/20 1357 5\' 8"  (1.727 m)     Head Circumference --      Peak Flow --      Pain Score 09/22/20 1432 10     Pain Loc --      Pain Edu? --      Excl. in GC? --      Physical Exam Vitals and nursing note reviewed.  Constitutional:      General: She is not in acute distress.     Appearance: She is well-developed.  HENT:     Head: Normocephalic and atraumatic.  Eyes:     Conjunctiva/sclera: Conjunctivae normal.  Cardiovascular:     Rate and Rhythm: Normal rate and regular rhythm.     Heart sounds: Normal heart sounds. No murmur heard. No friction rub.  Pulmonary:     Effort: Pulmonary effort is normal. No respiratory distress.     Breath sounds: Normal breath sounds. No wheezing or rales.  Abdominal:     General: There is no distension.     Palpations: Abdomen is soft.     Tenderness: There is abdominal tenderness in the suprapubic area. There is no right CVA tenderness, left CVA tenderness, guarding or rebound. Negative signs include Murphy's sign and Rovsing's sign.  Musculoskeletal:     Cervical back: Neck supple.  Skin:    General: Skin is warm.     Capillary Refill: Capillary refill takes less than 2 seconds.  Neurological:     Mental Status: She is alert and oriented to person, place, and time.     Motor: No abnormal muscle tone.       ____________________________________________   LABS (all labs ordered are listed, but only abnormal results are displayed)  Labs Reviewed  LIPASE, BLOOD - Abnormal; Notable for the following components:      Result Value   Lipase 52 (*)    All other components within normal limits  COMPREHENSIVE METABOLIC PANEL - Abnormal; Notable for the following components:   Sodium 133 (*)    CO2 21 (*)    Glucose, Bld 152 (*)    Total Protein 8.3 (*)    All other components within normal limits  CBC - Abnormal; Notable for the following components:   WBC 16.0 (*)    Hemoglobin 11.9 (*)    All other components within normal limits  URINALYSIS, COMPLETE (UACMP) WITH MICROSCOPIC - Abnormal; Notable for the following components:   Color, Urine YELLOW (*)    APPearance CLOUDY (*)    Specific Gravity, Urine 1.033 (*)    Hgb urine dipstick SMALL (*)    Protein, ur 30 (*)    Leukocytes,Ua LARGE (*)    WBC, UA >50 (*)  All other components within normal limits  CHLAMYDIA/NGC RT PCR ([facility] ONLY)  URINE CULTURE  POC URINE PREG, ED    ____________________________________________  EKG: Normal sinus rhythm, VR 87. PR 132, QRS 80, QTc 445. No acute ST elevations or depressions. ________________________________________  RADIOLOGY All imaging, including plain films, CT scans, and ultrasounds, independently reviewed by me, and interpretations confirmed via formal radiology reads.  ED MD interpretation:     Official radiology report(s): No results found.  ____________________________________________  PROCEDURES   Procedure(s) performed (including Critical Care):  Procedures  ____________________________________________  INITIAL IMPRESSION / MDM / ASSESSMENT AND PLAN / ED COURSE  As part of my medical decision making, I reviewed the following data within the electronic MEDICAL RECORD NUMBER Nursing notes reviewed and incorporated, Old chart reviewed, Notes from prior ED visits, and [place] Controlled Substance Database       *Sandy Mason was evaluated in Emergency Department on 09/22/2020 for the symptoms described in the history of present illness. She was evaluated in the context of the global COVID-19 pandemic, which necessitated consideration that the patient might be at risk for infection with the SARS-CoV-2 virus that causes COVID-19. Institutional protocols and algorithms that pertain to the evaluation of patients at risk for COVID-19 are in a state of rapid change based on information released by regulatory bodies including the CDC and federal and state organizations. These policies and algorithms were followed during the patient's care in the ED.  Some ED evaluations and interventions may be delayed as a result of limited staffing during the pandemic.*     Medical Decision Making: 45 year old female here with suprapubic pain and hematuria with small amount of blood after wiping  after urination.  Lab work shows leukocytosis, mild dehydration, and urinalysis consistent with UTI.  She has no vaginal discharge, dyspareunia, denies concerns for STD/STI, and all of her symptoms are related to urinary issues, with blood only after urinating, do not feel STD/STI is likely.  Will send urine test nonetheless.  Given her allergy to cephalosporin, will hold on empiric Rocephin at this time as she denies any other high risk features for gonorrhea.  Will treat for UTI with Cipro.  Urine culture sent.  Otherwise, no flank pain, nausea, vomiting, signs of pyelonephritis.  She systemically well-appearing.  Return precautions given.  ____________________________________________  FINAL CLINICAL IMPRESSION(S) / ED DIAGNOSES  Final diagnoses:  Cystitis with hematuria     MEDICATIONS GIVEN DURING THIS VISIT:  Medications  ondansetron (ZOFRAN-ODT) disintegrating tablet 4 mg (has no administration in time range)  ciprofloxacin (CIPRO) tablet 500 mg (500 mg Oral Given 09/22/20 1647)  oxyCODONE-acetaminophen (PERCOCET/ROXICET) 5-325 MG per tablet 2 tablet (2 tablets Oral Given 09/22/20 1647)  phenazopyridine (PYRIDIUM) tablet 200 mg (200 mg Oral Given 09/22/20 1647)     ED Discharge Orders         Ordered    ciprofloxacin (CIPRO) 500 MG tablet  2 times daily        09/22/20 1759    ondansetron (ZOFRAN ODT) 4 MG disintegrating tablet  Every 8 hours PRN        09/22/20 1759    phenazopyridine (PYRIDIUM) 200 MG tablet  3 times daily PRN        09/22/20 1759    ibuprofen (ADVIL) 600 MG tablet  Every 8 hours PRN        09/22/20 1759           Note:  This document was prepared using Dragon voice recognition  software and may include unintentional dictation errors.   Shaune PollackIsaacs, Debroah Shuttleworth, MD 09/22/20 1800    Shaune PollackIsaacs, Darrio Bade, MD 10/25/20 2045

## 2020-09-22 NOTE — ED Notes (Signed)
Pt in bathroom

## 2020-09-22 NOTE — ED Triage Notes (Signed)
First Nurse Note:  C/O abdominal pain x 1 day and vaginal bleeding x 2 days.  AAOx3.  Skin warm and dry. NAD

## 2020-09-22 NOTE — Discharge Instructions (Addendum)
Drink plenty of fluids  Take the antibiotics and meds as prescribed  Cranberry juice can also help with hydration and symptoms  Your symptoms should improve over the next 24-48 hours  If pain or fever persists after 48 hours, return to the ER

## 2020-09-23 ENCOUNTER — Emergency Department: Payer: Medicaid Other

## 2020-09-23 ENCOUNTER — Telehealth: Payer: Self-pay | Admitting: Emergency Medicine

## 2020-09-23 ENCOUNTER — Emergency Department
Admission: EM | Admit: 2020-09-23 | Discharge: 2020-09-23 | Disposition: A | Discharge status: Home / Self Care | Payer: Medicaid Other | Attending: Emergency Medicine | Admitting: Emergency Medicine

## 2020-09-23 ENCOUNTER — Encounter: Payer: Self-pay | Admitting: Emergency Medicine

## 2020-09-23 ENCOUNTER — Other Ambulatory Visit: Payer: Self-pay

## 2020-09-23 DIAGNOSIS — N739 Female pelvic inflammatory disease, unspecified: Secondary | ICD-10-CM | POA: Insufficient documentation

## 2020-09-23 DIAGNOSIS — A549 Gonococcal infection, unspecified: Secondary | ICD-10-CM | POA: Diagnosis not present

## 2020-09-23 DIAGNOSIS — R1030 Lower abdominal pain, unspecified: Secondary | ICD-10-CM | POA: Diagnosis present

## 2020-09-23 DIAGNOSIS — F17210 Nicotine dependence, cigarettes, uncomplicated: Secondary | ICD-10-CM | POA: Diagnosis not present

## 2020-09-23 DIAGNOSIS — N3091 Cystitis, unspecified with hematuria: Secondary | ICD-10-CM | POA: Diagnosis not present

## 2020-09-23 DIAGNOSIS — R310 Gross hematuria: Secondary | ICD-10-CM | POA: Diagnosis not present

## 2020-09-23 DIAGNOSIS — N730 Acute parametritis and pelvic cellulitis: Secondary | ICD-10-CM

## 2020-09-23 LAB — URINALYSIS, ROUTINE W REFLEX MICROSCOPIC
Bacteria, UA: NONE SEEN
Bilirubin Urine: NEGATIVE
Glucose, UA: NEGATIVE mg/dL
Ketones, ur: NEGATIVE mg/dL
Nitrite: NEGATIVE
Protein, ur: 30 mg/dL — AB
RBC / HPF: >50 RBC/hpf — ABNORMAL HIGH (ref 0–5)
Specific Gravity, Urine: 1.017 (ref 1.005–1.030)
WBC, UA: >50 WBC/hpf — ABNORMAL HIGH (ref 0–5)
pH: 5 (ref 5.0–8.0)

## 2020-09-23 LAB — CHLAMYDIA/NGC RT PCR (ARMC ONLY)
Chlamydia Tr: NOT DETECTED
N gonorrhoeae: DETECTED — AB

## 2020-09-23 LAB — WET PREP, GENITAL
Sperm: NONE SEEN
Trich, Wet Prep: NONE SEEN
Yeast Wet Prep HPF POC: NONE SEEN

## 2020-09-23 MED ORDER — DIPHENHYDRAMINE HCL 25 MG PO CAPS
50.0000 mg | ORAL_CAPSULE | Freq: Once | ORAL | Status: AC
  Administered 2020-09-23: 50 mg via ORAL
  Filled 2020-09-23: qty 2

## 2020-09-23 MED ORDER — KETOROLAC TROMETHAMINE 30 MG/ML IJ SOLN
30.0000 mg | Freq: Once | INTRAMUSCULAR | Status: AC
  Administered 2020-09-23: 30 mg via INTRAMUSCULAR
  Filled 2020-09-23: qty 1

## 2020-09-23 MED ORDER — METRONIDAZOLE 500 MG PO TABS
500.0000 mg | ORAL_TABLET | Freq: Once | ORAL | Status: AC
  Administered 2020-09-23: 500 mg via ORAL
  Filled 2020-09-23: qty 1

## 2020-09-23 MED ORDER — OXYCODONE-ACETAMINOPHEN 5-325 MG PO TABS
1.0000 | ORAL_TABLET | Freq: Once | ORAL | Status: AC
Start: 2020-09-23 — End: 2020-09-23
  Administered 2020-09-23: 1 via ORAL
  Filled 2020-09-23: qty 1

## 2020-09-23 MED ORDER — AZITHROMYCIN 500 MG PO TABS
1000.0000 mg | ORAL_TABLET | Freq: Once | ORAL | Status: AC
  Administered 2020-09-23: 1000 mg via ORAL
  Filled 2020-09-23: qty 2

## 2020-09-23 MED ORDER — METRONIDAZOLE 500 MG PO TABS: 500.0000 mg | ORAL_TABLET | Freq: Two times a day (BID) | ORAL | 0 refills | Status: AC

## 2020-09-23 MED ORDER — DOXYCYCLINE HYCLATE 100 MG PO TABS: 100.0000 mg | ORAL_TABLET | Freq: Two times a day (BID) | ORAL | 0 refills | Status: AC

## 2020-09-23 MED ORDER — DOXYCYCLINE HYCLATE 100 MG PO TABS
100.0000 mg | ORAL_TABLET | Freq: Once | ORAL | Status: AC
  Administered 2020-09-23: 100 mg via ORAL
  Filled 2020-09-23: qty 1

## 2020-09-23 MED ORDER — CEFTRIAXONE SODIUM 1 G IJ SOLR
500.0000 mg | Freq: Once | INTRAMUSCULAR | Status: AC
  Administered 2020-09-23: 500 mg via INTRAMUSCULAR
  Filled 2020-09-23: qty 10

## 2020-09-23 MED ORDER — OXYCODONE-ACETAMINOPHEN 5-325 MG PO TABS: 1.0000 | ORAL_TABLET | Freq: Three times a day (TID) | ORAL | 0 refills | Status: AC | PRN

## 2020-09-23 NOTE — Discharge Instructions (Addendum)
You have been treated for suspected pelvic inflammatory disease.  This is caused by an untreated sexually transmitted infection.  Urine culture yesterday confirmed gonorrhea.  You have been treated in the ED with an initial dose of antibiotics.  Take the prescription medicines as prescribed.  You may follow-up with your primary provider for ongoing symptoms.  You should avoid intimate contact with your partner until all parties have been treated and are symptom-free.

## 2020-09-23 NOTE — Telephone Encounter (Signed)
Called patient to inform of STD result positive for gonorrhea and need for treatment. She can be treated for this by her pcp, the [facility] or urgent care.   Her voicemail is full, but I sent an SMS with my number.

## 2020-09-23 NOTE — ED Provider Notes (Signed)
[facility] Emergency Department Provider Note ____________________________________________  Time seen: 1558  I have reviewed the triage vital signs and the nursing notes.  HISTORY  Chief Complaint  Cystitis  HPI Sandy Mason is a 45 y.o. female returns to the ED 1 day after being treated and diagnosed for cystitis.  Patient presented initially with complaints of lower abdominal pain,  as well as gross hematuria.  She was discharged with a prescription for Cipro as well as ibuprofen.  She returned today reporting she has not had any better, has continued to have gross blood upon urination.  She was advised by her primary provider to return to the ER for further evaluation.  Denies any interim fever, chills, sweats but she has noted some interim abdominal pain, malaise, and abdominal discomfort.  She initially reported symptoms onset 3 weeks ago, but she assumed she was having a mild UTI.  She started to increase doses of cranberry juice, and noted some resolution of symptoms.  She did not seek care at that time.  Past Medical History:  Diagnosis Date  . Adult ADHD 05/2020  . Alcohol use disorder severe. 12/18/2014  . Anxiety   . Borderline personality disorder (HCC) 12/18/2014  . CVA (cerebral infarction)   . Depression   . 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 Opioid use disorder, severe, dependence (HCC) 12/20/2014  . PTSD (post-traumatic stress disorder) 03/28/2015  . 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 Seizures (HCC)   . Suicide attempt by hanging [facility])     Patient Active Problem List   Diagnosis Date Noted  . Major depressive disorder, recurrent severe without psychotic features (HCC) 04/11/2017  . Salicylate overdose 04/09/2017  . AKI (acute kidney injury) (HCC) 04/03/2016  . Suicidal behavior 01/16/2016  . Alcohol intoxication (HCC)   . Severe episode of  recurrent major depressive disorder, without psychotic features (HCC)   . Malingering 12/16/2015  . Major depressive disorder, recurrent episode, moderate (HCC) 12/12/2015  . Closed pelvic fracture (HCC) 10/31/2015  . Tobacco abuse 10/31/2015  . Tobacco use disorder 03/28/2015  . PTSD (post-traumatic stress disorder) 03/28/2015  . Stimulant use disorder (cocaine) 03/28/2015  . 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 Opioid use disorder, severe, dependence (HCC) 12/20/2014  . Alcohol use disorder severe. 12/18/2014  . Borderline personality disorder (HCC) 12/18/2014    Past Surgical History:  Procedure Laterality Date  . NO PAST SURGERIES      Prior to Admission medications   Medication Sig Start Date End Date Taking? Authorizing Provider  doxycycline (VIBRA-TABS) 100 MG tablet Take 1 tablet (100 mg total) by mouth 2 (two) times daily for 7 days. 09/23/20 09/30/20 Yes Samaa Ueda, Charlesetta Ivory, PA-C  metroNIDAZOLE (FLAGYL) 500 MG tablet Take 1 tablet (500 mg total) by mouth 2 (two) times daily for 7 days. 09/23/20 09/30/20 Yes Kyheem Bathgate, Charlesetta Ivory, PA-C  oxyCODONE-acetaminophen (PERCOCET) 5-325 MG tablet Take 1-2 tablets by mouth every 8 (eight) hours as needed for up to 3 days for severe pain. 09/23/20 09/26/20 Yes Dorthy Magnussen, Charlesetta Ivory, PA-C  acetaminophen (TYLENOL) 325 MG tablet Take by mouth every 6 (six) hours as needed.  02/20/13   [provider]  ciprofloxacin (CIPRO) 500 MG tablet Take 1 tablet (500 mg total) by mouth 2 (two) times daily for 7 days. 09/22/20 09/29/20  Shaune Pollack, MD  diphenhydrAMINE (BENADRYL ALLERGY) 25 MG tablet Take 1  tablet (25 mg total) by mouth every 6 (six) hours as needed. 05/25/20   Orvil Feil, PA-C  famotidine (PEPCID) 20 MG tablet Take 1 tablet (20 mg total) by mouth 2 (two) times daily. 05/25/20 05/25/21  Orvil Feil, PA-C  gabapentin (NEURONTIN) 300 MG capsule Take 900 mg by mouth 3 (three)  times daily.    [provider]  hydrOXYzine (ATARAX/VISTARIL) 25 MG tablet Take 25 mg by mouth 2 (two) times daily as needed. 11/23/19   [provider]  ibuprofen (ADVIL) 600 MG tablet Take 1 tablet (600 mg total) by mouth every 8 (eight) hours as needed for moderate pain. 09/22/20   Shaune Pollack, MD  levonorgestrel (MIRENA) 20 MCG/24HR IUD 1 each by Intrauterine route once.    [provider]  mirtazapine (REMERON) 30 MG tablet Take 30 mg by mouth at bedtime.    [provider]  naloxone [facility]) nasal spray 4 mg/0.1 mL Nasal spray in case of heroin overdose again 03/15/19   Emily Filbert, MD  naproxen (NAPROSYN) 500 MG tablet Take 1 tablet (500 mg total) by mouth 2 (two) times daily with a meal. 03/09/20 03/09/21  Cuthriell, Delorise Royals, PA-C  ondansetron (ZOFRAN ODT) 4 MG disintegrating tablet Take 1 tablet (4 mg total) by mouth every 8 (eight) hours as needed for nausea or vomiting. 09/22/20   Shaune Pollack, MD  pantoprazole (PROTONIX) 20 MG tablet Take 1 tablet (20 mg total) by mouth daily. 09/28/19 09/27/20  Jene Every, MD  phenazopyridine (PYRIDIUM) 200 MG tablet Take 1 tablet (200 mg total) by mouth 3 (three) times daily as needed for up to 3 days for pain. 09/22/20 09/25/20  Shaune Pollack, MD  pseudoephedrine (SUDAFED) 120 MG 12 hr tablet Take 1 tablet (120 mg total) by mouth 2 (two) times daily as needed for congestion. 02/27/20 02/26/21  Joni Reining, PA-C  REXULTI 2 MG TABS tablet Take 2 mg by mouth daily. 11/24/19   [provider]  sucralfate (CARAFATE) 1 g tablet Take 1 tablet (1 g total) by mouth 4 (four) times daily for 15 days. 09/28/19 12/29/19  Jene Every, MD  triamcinolone ointment (KENALOG) 0.5 % Apply 1 application topically 2 (two) times daily. 05/25/20   Orvil Feil, PA-C  venlafaxine XR (EFFEXOR-XR) 150 MG 24 hr capsule Take 300 mg by mouth daily with breakfast.     [provider]    Allergies Depakote  [valproic acid], Haldol [haloperidol lactate], Buspar [buspirone], Meloxicam, and Keflet [cephalexin]  Family History  Problem Relation Age of Onset  . Diabetes Father   . Dementia Mother   . Anxiety disorder Mother     Social History Social History   Tobacco Use  . Smoking status: Current Every Day Smoker    Packs/day: 0.00    Years: 20.00    Pack years: 0.00    Types: Cigarettes  . Smokeless tobacco: Never Used  . Tobacco comment: 1-2 cigarettes per day  Vaping Use  . Vaping Use: Never used  Substance Use Topics  . Alcohol use: Yes    Comment: socially  . Drug use: No    Types: Benzodiazepines, Hydrocodone    Review of Systems  Constitutional: Negative for fever. Eyes: Negative for visual changes. ENT: Negative for sore throat. Cardiovascular: Negative for chest pain. Respiratory: Negative for shortness of breath. Gastrointestinal: Positive for lower abdominal abdominal and flank pain.  Denies vomiting and constipation.  Reports episodic diarrhea. Genitourinary: Positive for dysuria and hematuria.  Reports pelvic pain. Musculoskeletal: Negative for back pain. Skin: Negative for rash. Neurological: Negative for headaches, focal weakness or numbness. ____________________________________________  PHYSICAL EXAM:  VITAL SIGNS: ED Triage Vitals  Enc Vitals Group     BP 09/23/20 1440 (!) 125/91     Pulse Rate 09/23/20 1440 (!) 105     Resp 09/23/20 1440 16     Temp 09/23/20 1440 98.3 F (36.8 C)     Temp Source 09/23/20 1440 Oral     SpO2 09/23/20 1440 98 %     Weight 09/23/20 1441 220 lb (99.8 kg)     Height 09/23/20 1441 5\' 8"  (1.727 m)     Head Circumference --      Peak Flow --      Pain Score 09/23/20 1440 10     Pain Loc --      Pain Edu? --      Excl. in GC? --     Constitutional: Alert and oriented. Well appearing and in no distress. Head: Normocephalic and atraumatic. Eyes: Conjunctivae are normal. Normal extraocular movements Cardiovascular:  Normal rate, regular rhythm. Normal distal pulses. Respiratory: Normal respiratory effort. No wheezes/rales/rhonchi. Gastrointestinal: Soft and generally tender to palpation. No distention, rebound, or guarding.  Normoactive bowel sounds.  Mild flank tenderness is noted. GU: normal external genitalia.  Patient with a scant whitish mucoid discharge noted in the vault.  String from the IUD is noted in the cervical os.  Patient with some cervical motion tenderness on exam but no adnexal masses appreciated. Musculoskeletal: Nontender with normal range of motion in all extremities.  Neurologic:  Normal gait without ataxia. Normal speech and language. No gross focal neurologic deficits are appreciated. Skin:  Skin is warm, dry and intact. No rash noted. Psychiatric: Mood and affect are normal. Patient exhibits appropriate insight and judgment. ____________________________________________   LABS (pertinent positives/negatives) Labs Reviewed  WET PREP, GENITAL - Abnormal; Notable for the following components:      Result Value   Clue Cells Wet Prep HPF POC PRESENT (*)    WBC, Wet Prep HPF POC MANY (*)    All other components within normal limits  URINALYSIS, ROUTINE W REFLEX MICROSCOPIC - Abnormal; Notable for the following components:   Color, Urine AMBER (*)    APPearance CLOUDY (*)    Hgb urine dipstick LARGE (*)    Protein, ur 30 (*)    Leukocytes,Ua LARGE (*)    RBC / HPF >50 (*)    WBC, UA >50 (*)    All other components within normal limits  URINE CULTURE  CHLAMYDIA/NGC RT PCR ([facility] ONLY)  ____________________________________________   RADIOLOGY  CT Renal Stone Study   IMPRESSION: 1. No evidence for acute abnormality of the abdomen or pelvis. 2. Moderate hiatal hernia. 3. Colonic diverticulosis. 4. Small fat containing paraumbilical hernia. 5. Intrauterine device appears well positioned in the central aspect of the  uterus.  ____________________________________________  PROCEDURES  Toradol 30 mg IM Rocephin 500 mg IM Azithromycin 1000 mg PO Benadryl 50 mg PO Metronidazole 500 mg PO  Procedures ____________________________________________  INITIAL IMPRESSION / ASSESSMENT AND PLAN / ED COURSE  Differential diagnosis includes, but is not limited to, ovarian cyst, ovarian torsion, acute appendicitis, diverticulitis, urinary tract infection/pyelonephritis, endometriosis, bowel obstruction, colitis, renal colic, gastroenteritis, hernia, fibroids, pregnancy related pain including ectopic pregnancy, etc.  Patient with ED evaluation and return visit for ongoing lower abdominal pain, dysuria, and gross hematuria.  Patient was treated empirically yesterday with Cipro for presumed  UTI.  She had a GC urine culture pending, not apparently reported overnight.  An attempt was apparently made to contact the patient via telephone but her voicemail was full.  According to the nurse a SMS message was left on the patient's phone.  The patient confirmed today that she did not receive the results of this culture result, which warranted her to return for treatment.  She is here today and will be treated for gonorrhea as well as PID.  CT scan did not reveal any hydronephrosis or renal calculi.  Patient's wet prep confirms some reactive white blood cells as well as clue cells.  GC pelvic culture as well as a urine culture are pending at this time.  Patient has been treated in the ED with an initial dose of doxycycline, metronidazole, azithromycin, and ceftriaxone.  Pain medicines have been provided in the form of oxycodone and Toradol.  A dose of prophylactic Benadryl was given given the patient's mild reported sensitivity to cephalosporins.  She tolerated the Rocephin in the ED without subsequent itching, hives, or anaphylaxis.  She is discharged to follow-up with primary provider for ongoing symptoms peer return precautions have  been discussed.  Patient also advised that her partner should be treated and tested before they resume intermittent activity.  Sandy Mason was evaluated in Emergency Department on 09/23/2020 for the symptoms described in the history of present illness. She was evaluated in the context of the global COVID-19 pandemic, which necessitated consideration that the patient might be at risk for infection with the SARS-CoV-2 virus that causes COVID-19. Institutional protocols and algorithms that pertain to the evaluation of patients at risk for COVID-19 are in a state of rapid change based on information released by regulatory bodies including the CDC and federal and state organizations. These policies and algorithms were followed during the patient's care in the ED.  I reviewed the patient's prescription history over the last 12 months in the multi-state controlled substances database(s) that includes [place], [place], [place], [place], [place], [place], [place], [place], [place], [place], [place], [place], [place], and [place].  Results were notable for maintainence meds noted.  ____________________________________________  FINAL CLINICAL IMPRESSION(S) / ED DIAGNOSES  Final diagnoses:  Gonorrhea  PID (acute pelvic inflammatory disease)      Karmen Stabs, Charlesetta Ivory, PA-C 09/23/20 1808    Phineas Semen, MD 09/23/20 [phone]

## 2020-09-23 NOTE — ED Triage Notes (Signed)
Pt here with c/o continued pain from bladder infection, diagnosed here yesterday. States she received antibiotic, asos, and pain med given in ED, pt states the pain has not gotten any better and was told to come back here by her primary dr office who cannot see her today. Pt states she is still bleeding with urination and having burning with urination as well. NAD.

## 2020-09-24 LAB — URINE CULTURE

## 2020-09-25 LAB — URINE CULTURE: Special Requests: NORMAL

## 2020-11-04 ENCOUNTER — Encounter: Payer: Self-pay | Admitting: Intensive Care

## 2020-11-04 ENCOUNTER — Emergency Department
Admission: EM | Admit: 2020-11-04 | Discharge: 2020-11-05 | Disposition: A | Discharge status: Home / Self Care | Payer: No Typology Code available for payment source | Attending: Emergency Medicine | Admitting: Emergency Medicine

## 2020-11-04 ENCOUNTER — Other Ambulatory Visit: Payer: Self-pay

## 2020-11-04 DIAGNOSIS — F331 Major depressive disorder, recurrent, moderate: Secondary | ICD-10-CM | POA: Diagnosis present

## 2020-11-04 DIAGNOSIS — R45851 Suicidal ideations: Secondary | ICD-10-CM | POA: Insufficient documentation

## 2020-11-04 DIAGNOSIS — Z79899 Other long term (current) drug therapy: Secondary | ICD-10-CM | POA: Diagnosis not present

## 2020-11-04 DIAGNOSIS — R451 Restlessness and agitation: Secondary | ICD-10-CM | POA: Insufficient documentation

## 2020-11-04 DIAGNOSIS — F17210 Nicotine dependence, cigarettes, uncomplicated: Secondary | ICD-10-CM | POA: Diagnosis not present

## 2020-11-04 DIAGNOSIS — F1490 Cocaine use, unspecified, uncomplicated: Secondary | ICD-10-CM | POA: Insufficient documentation

## 2020-11-04 DIAGNOSIS — Z765 Malingerer [conscious simulation]: Secondary | ICD-10-CM | POA: Insufficient documentation

## 2020-11-04 DIAGNOSIS — F332 Major depressive disorder, recurrent severe without psychotic features: Secondary | ICD-10-CM | POA: Diagnosis present

## 2020-11-04 DIAGNOSIS — F603 Borderline personality disorder: Secondary | ICD-10-CM | POA: Insufficient documentation

## 2020-11-04 DIAGNOSIS — F4310 Post-traumatic stress disorder, unspecified: Secondary | ICD-10-CM | POA: Insufficient documentation

## 2020-11-04 DIAGNOSIS — Z8673 Personal history of transient ischemic attack (TIA), and cerebral infarction without residual deficits: Secondary | ICD-10-CM | POA: Diagnosis not present

## 2020-11-04 DIAGNOSIS — R4589 Other symptoms and signs involving emotional state: Secondary | ICD-10-CM | POA: Diagnosis present

## 2020-11-04 DIAGNOSIS — F1590 Other stimulant use, unspecified, uncomplicated: Secondary | ICD-10-CM | POA: Diagnosis present

## 2020-11-04 DIAGNOSIS — F419 Anxiety disorder, unspecified: Secondary | ICD-10-CM | POA: Diagnosis present

## 2020-11-04 DIAGNOSIS — F322 Major depressive disorder, single episode, severe without psychotic features: Secondary | ICD-10-CM | POA: Diagnosis not present

## 2020-11-04 DIAGNOSIS — Z20822 Contact with and (suspected) exposure to covid-19: Secondary | ICD-10-CM | POA: Diagnosis not present

## 2020-11-04 DIAGNOSIS — F17200 Nicotine dependence, unspecified, uncomplicated: Secondary | ICD-10-CM | POA: Diagnosis present

## 2020-11-04 LAB — CBC
HCT: 37.2 % (ref 36.0–46.0)
Hemoglobin: 12.6 g/dL (ref 12.0–15.0)
MCH: 28.8 pg (ref 26.0–34.0)
MCHC: 33.9 g/dL (ref 30.0–36.0)
MCV: 84.9 fL (ref 80.0–100.0)
Platelets: 272 10*3/uL (ref 150–400)
RBC: 4.38 MIL/uL (ref 3.87–5.11)
RDW: 14.8 % (ref 11.5–15.5)
WBC: 15.8 10*3/uL — ABNORMAL HIGH (ref 4.0–10.5)
nRBC: 0 % (ref 0.0–0.2)

## 2020-11-04 LAB — COMPREHENSIVE METABOLIC PANEL
ALT: 25 U/L (ref 0–44)
AST: 24 U/L (ref 15–41)
Albumin: 4.2 g/dL (ref 3.5–5.0)
Alkaline Phosphatase: 60 U/L (ref 38–126)
Anion gap: 10 (ref 5–15)
BUN: 10 mg/dL (ref 6–20)
CO2: 23 mmol/L (ref 22–32)
Calcium: 9.2 mg/dL (ref 8.9–10.3)
Chloride: 105 mmol/L (ref 98–111)
Creatinine, Ser: 0.81 mg/dL (ref 0.44–1.00)
GFR, Estimated: >60 mL/min (ref >60)
Glucose, Bld: 143 mg/dL — ABNORMAL HIGH (ref 70–99)
Potassium: 2.9 mmol/L — ABNORMAL LOW (ref 3.5–5.1)
Sodium: 138 mmol/L (ref 135–145)
Total Bilirubin: 0.3 mg/dL (ref 0.3–1.2)
Total Protein: 7.9 g/dL (ref 6.5–8.1)

## 2020-11-04 LAB — ACETAMINOPHEN LEVEL: Acetaminophen (Tylenol), Serum: <10 ug/mL — ABNORMAL LOW (ref 10–30)

## 2020-11-04 LAB — ETHANOL: Alcohol, Ethyl (B): <10 mg/dL (ref <10)

## 2020-11-04 LAB — RESP PANEL BY RT-PCR (FLU A&B, COVID) ARPGX2
Influenza A by PCR: NEGATIVE
Influenza B by PCR: NEGATIVE
SARS Coronavirus 2 by RT PCR: NEGATIVE

## 2020-11-04 LAB — SALICYLATE LEVEL: Salicylate Lvl: 14.3 mg/dL (ref 7.0–30.0)

## 2020-11-04 MED ORDER — ONDANSETRON HCL 4 MG PO TABS
4.0000 mg | ORAL_TABLET | Freq: Three times a day (TID) | ORAL | Status: DC | PRN
  Administered 2020-11-04: 4 mg via ORAL
  Filled 2020-11-04: qty 1

## 2020-11-04 MED ORDER — ONDANSETRON HCL 4 MG PO TABS: 4.0000 mg | ORAL_TABLET | Freq: Once | ORAL | Status: DC

## 2020-11-04 MED ORDER — ACETAMINOPHEN 500 MG PO TABS: 1000.0000 mg | ORAL_TABLET | Freq: Once | ORAL | Status: DC

## 2020-11-04 MED ORDER — FAMOTIDINE 20 MG PO TABS: 40.0000 mg | ORAL_TABLET | Freq: Two times a day (BID) | ORAL | Status: DC

## 2020-11-04 MED ORDER — ALUM & MAG HYDROXIDE-SIMETH 200-200-20 MG/5ML PO SUSP: 30.0000 mL | ORAL | Status: DC | PRN

## 2020-11-04 MED ORDER — LORAZEPAM 1 MG PO TABS
1.0000 mg | ORAL_TABLET | Freq: Once | ORAL | Status: AC
  Administered 2020-11-04: 1 mg via ORAL
  Filled 2020-11-04: qty 1

## 2020-11-04 MED ORDER — ACETAMINOPHEN 500 MG PO TABS
1000.0000 mg | ORAL_TABLET | Freq: Four times a day (QID) | ORAL | Status: DC | PRN
  Administered 2020-11-04: 1000 mg via ORAL
  Filled 2020-11-04: qty 2

## 2020-11-04 NOTE — ED Notes (Signed)
Hourly rounding completed at this time, patient currently asleep in room. No complaints, stable, and in no acute distress. Q15 minute rounds and monitoring via Security Cameras to continue. 

## 2020-11-04 NOTE — ED Notes (Signed)
Pt reports now that she has meds for anxiety, she has been having a headache and nausea due to lack of eating. Requests water, tylenol and something for  Nausea. Dr. Scotty Court sent secure chat, will follow up.

## 2020-11-04 NOTE — ED Triage Notes (Signed)
Patient reports anxiety and nerve issues, getting 1-2 hours of sleep a night, not eating. Currently still taking behavioral medications. Reports recent change in medications the past month. Also reports personal and financial issues recently. Denies SI/HI.

## 2020-11-04 NOTE — ED Notes (Signed)
Hourly rounding completed at this time, patient currently awake in room. No complaints, stable, and in no acute distress. Q15 minute rounds and monitoring via Security Cameras to continue. 

## 2020-11-04 NOTE — ED Notes (Addendum)
Patient belongings: silver rings, earrings, and bracelets placed in urine cup. Leopard sweater, nike tennis shoes, black sweatpants, black sportsbra, two pairs of socks, grey underwear, brown hairbow. Green purse. Cell phone. ID, 1 CC, debit cards, $71.00 in cash

## 2020-11-04 NOTE — ED Notes (Signed)
Patient transferred from ED to [facility] room 3 after screening for contraband. Report received from Jeannett Senior, RN including Situation, Background, Assessment and Recommendations. Pt oriented to unit including Q15 minute rounds as well as the security cameras for their protection. Patient is alert and oriented, warm and dry in no acute distress. Patient denies SI, HI, and AVH. Pt. Encouraged to let this nurse know if needs arise.

## 2020-11-04 NOTE — ED Provider Notes (Signed)
[facility] Emergency Department Provider Note  ____________________________________________  Time seen: Approximately 7:46 PM  I have reviewed the triage vital signs and the nursing notes.   HISTORY  Chief Complaint Mental Health Problem    HPI Sandy Mason is a 45 y.o. female with a history of alcohol abuse depression seizures and prior suicide attempts who comes ED complaining of anxiety, feeling like her life is spiraling out of control over the past 2 weeks.  Not eating, not sleeping, feeling nauseated.  Denies any pain fever vomiting.  Reports the symptoms started when she had to start titrating off of her Rexulti.  2 weeks ago, she started having involuntary jaw spasms which sound like a dystonic reaction.  She called her doctor who told her to decrease the Rexulti from 2 mg daily to 1 mg daily.  However, a week ago she had a similar reaction so she stopped taking it altogether.  During this time her psychiatric symptoms have continued worsening.   Currently denies SI HI or hallucinations.   Past Medical History:  Diagnosis Date  . Adult ADHD 05/2020  . Alcohol use disorder severe. 12/18/2014  . Anxiety   . Borderline personality disorder (HCC) 12/18/2014  . CVA (cerebral infarction)   . Depression   . 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 Opioid use disorder, severe, dependence (HCC) 12/20/2014  . PTSD (post-traumatic stress disorder) 03/28/2015  . 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 Seizures (HCC)   . Suicide attempt by hanging [facility])      Patient Active Problem List   Diagnosis Date Noted  . Major depressive disorder, recurrent severe without psychotic features (HCC) 04/11/2017  . Salicylate overdose 04/09/2017  . AKI (acute kidney injury) (HCC) 04/03/2016  . Suicidal behavior 01/16/2016  . Alcohol intoxication (HCC)   . Severe episode of  recurrent major depressive disorder, without psychotic features (HCC)   . Malingering 12/16/2015  . Major depressive disorder, recurrent episode, moderate (HCC) 12/12/2015  . Closed pelvic fracture (HCC) 10/31/2015  . Tobacco abuse 10/31/2015  . Tobacco use disorder 03/28/2015  . PTSD (post-traumatic stress disorder) 03/28/2015  . Stimulant use disorder (cocaine) 03/28/2015  . 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 Opioid use disorder, severe, dependence (HCC) 12/20/2014  . Alcohol use disorder severe. 12/18/2014  . Borderline personality disorder (HCC) 12/18/2014     Past Surgical History:  Procedure Laterality Date  . NO PAST SURGERIES       Prior to Admission medications   Medication Sig Start Date End Date Taking? Authorizing Provider  acetaminophen (TYLENOL) 325 MG tablet Take by mouth every 6 (six) hours as needed.  02/20/13   [provider]  ciprofloxacin (CIPRO) 500 MG tablet TAKE ONE TABLET BY MOUTH 2 TIMES A DAY FOR 7 DAYS 09/22/20 09/22/21  Shaune Pollack, MD  diphenhydrAMINE (BENADRYL ALLERGY) 25 MG tablet Take 1 tablet (25 mg total) by mouth every 6 (six) hours as needed. 05/25/20   Orvil Feil, PA-C  famotidine (PEPCID) 20 MG tablet Take 1 tablet (20 mg total) by mouth 2 (two) times daily. 05/25/20 05/25/21  Orvil Feil, PA-C  gabapentin (NEURONTIN) 300 MG capsule Take 900 mg by mouth 3 (three) times daily.    [provider]  hydrOXYzine (ATARAX/VISTARIL) 25 MG tablet Take 25 mg by mouth 2 (two) times daily as needed. 11/23/19   [provider]  ibuprofen (ADVIL)  600 MG tablet TAKE ONE TABLET BY MOUTH EVERY 8 HOURS AS NEEDED FOR MODERATE PAIN 09/22/20 09/22/21  Shaune PollackIsaacs, Cameron, MD  levonorgestrel (MIRENA) 20 MCG/24HR IUD 1 each by Intrauterine route once.    [provider]  mirtazapine (REMERON) 30 MG tablet Take 30 mg by mouth at bedtime.    [provider]  naloxone  [facility](NARCAN) nasal spray 4 mg/0.1 mL Nasal spray in case of heroin overdose again 03/15/19   Emily FilbertWilliams, Jonathan E, MD  naproxen (NAPROSYN) 500 MG tablet Take 1 tablet (500 mg total) by mouth 2 (two) times daily with a meal. 03/09/20 03/09/21  Cuthriell, Christiane HaJonathan D, PA-C  ondansetron (ZOFRAN-ODT) 4 MG disintegrating tablet TAKE ONE TABLET BY MOUTH EVERY 8 HOURS AS NEEDED FOR NAUSEA OR VOMITING 09/22/20 09/22/21  Shaune PollackIsaacs, Cameron, MD  pantoprazole (PROTONIX) 20 MG tablet Take 1 tablet (20 mg total) by mouth daily. 09/28/19 09/27/20  Jene EveryKinner, Robert, MD  pseudoephedrine (SUDAFED) 120 MG 12 hr tablet Take 1 tablet (120 mg total) by mouth 2 (two) times daily as needed for congestion. 02/27/20 02/26/21  Joni ReiningSmith, Ronald K, PA-C  REXULTI 2 MG TABS tablet Take 2 mg by mouth daily. 11/24/19   [provider]  sucralfate (CARAFATE) 1 g tablet Take 1 tablet (1 g total) by mouth 4 (four) times daily for 15 days. 09/28/19 12/29/19  Jene EveryKinner, Robert, MD  triamcinolone ointment (KENALOG) 0.5 % Apply 1 application topically 2 (two) times daily. 05/25/20   Orvil FeilWoods, Jaclyn M, PA-C  venlafaxine XR (EFFEXOR-XR) 150 MG 24 hr capsule Take 300 mg by mouth daily with breakfast.     [provider]     Allergies Depakote [valproic acid], Haldol [haloperidol lactate], Buspar [buspirone], Meloxicam, and Keflet [cephalexin]   Family History  Problem Relation Age of Onset  . Diabetes Father   . Dementia Mother   . Anxiety disorder Mother     Social History Social History   Tobacco Use  . Smoking status: Current Every Day Smoker    Packs/day: 0.00    Years: 20.00    Pack years: 0.00    Types: Cigarettes  . Smokeless tobacco: Never Used  . Tobacco comment: 1-2 cigarettes per day  Vaping Use  . Vaping Use: Never used  Substance Use Topics  . Alcohol use: Yes    Comment: socially  . Drug use: Yes    Types: Benzodiazepines, Hydrocodone    Comment: prescribed vivance and adderol    Review of Systems  Constitutional:    No fever or chills.  ENT:   No sore throat. No rhinorrhea. Cardiovascular:   No chest pain or syncope. Respiratory:   No dyspnea or cough. Gastrointestinal:   Negative for abdominal pain, vomiting and diarrhea.  Musculoskeletal:   Negative for focal pain or swelling All other systems reviewed and are negative except as documented above in ROS and HPI.  ____________________________________________   PHYSICAL EXAM:  VITAL SIGNS: ED Triage Vitals  Enc Vitals Group     BP 11/04/20 1839 (!) 134/95     Pulse Rate 11/04/20 1839 (!) 108     Resp 11/04/20 1839 18     Temp 11/04/20 1839 98.6 F (37 C)     Temp Source 11/04/20 1839 Oral     SpO2 11/04/20 1839 98 %     Weight 11/04/20 1840 220 lb (99.8 kg)     Height 11/04/20 1840 5\' 8"  (1.727 m)     Head Circumference --      Peak  Flow --      Pain Score 11/04/20 1840 9     Pain Loc --      Pain Edu? --      Excl. in GC? --     Vital signs reviewed, nursing assessments reviewed.   Constitutional:   Alert and oriented. Non-toxic appearance. Eyes:   Conjunctivae are normal. EOMI. PERRL. ENT      Head:   Normocephalic and atraumatic.      Nose:   Wearing a mask.      Mouth/Throat:   Wearing a mask.      Neck:   No meningismus. Full ROM. Hematological/Lymphatic/Immunilogical:   No cervical lymphadenopathy. Cardiovascular:   RRR. Symmetric bilateral radial and DP pulses.  No murmurs. Cap refill less than 2 seconds. Respiratory:   Normal respiratory effort without tachypnea/retractions. Breath sounds are clear and equal bilaterally. No wheezes/rales/rhonchi. Gastrointestinal:   Soft and nontender. Non distended. There is no CVA tenderness.  No rebound, rigidity, or guarding.  Musculoskeletal:   Normal range of motion in all extremities. Neurologic:   Normal speech and language.  Motor grossly intact. No acute focal neurologic deficits are appreciated.  Skin:    Skin is warm, dry and intact. No rash noted.  No petechiae, purpura,  or bullae.  ____________________________________________    LABS (pertinent positives/negatives) (all labs ordered are listed, but only abnormal results are displayed) Labs Reviewed  COMPREHENSIVE METABOLIC PANEL - Abnormal; Notable for the following components:      Result Value   Potassium 2.9 (*)    Glucose, Bld 143 (*)    All other components within normal limits  ACETAMINOPHEN LEVEL - Abnormal; Notable for the following components:   Acetaminophen (Tylenol), Serum <10 (*)    All other components within normal limits  CBC - Abnormal; Notable for the following components:   WBC 15.8 (*)    All other components within normal limits  RESP PANEL BY RT-PCR (FLU A&B, COVID) ARPGX2  ETHANOL  SALICYLATE LEVEL  URINE DRUG SCREEN, QUALITATIVE ([facility] ONLY)  POC URINE PREG, ED   ____________________________________________   EKG    ____________________________________________    RADIOLOGY  No results found.  ____________________________________________   PROCEDURES Procedures  ____________________________________________    CLINICAL IMPRESSION / ASSESSMENT AND PLAN / ED COURSE  Medications ordered in the ED: Medications  LORazepam (ATIVAN) tablet 1 mg (has no administration in time range)    Pertinent labs & imaging results that were available during my care of the patient were reviewed by me and considered in my medical decision making (see chart for details).  Sandy Mason was evaluated in Emergency Department on 11/04/2020 for the symptoms described in the history of present illness. She was evaluated in the context of the global COVID-19 pandemic, which necessitated consideration that the patient might be at risk for infection with the SARS-CoV-2 virus that causes COVID-19. Institutional protocols and algorithms that pertain to the evaluation of patients at risk for COVID-19 are in a state of rapid change based on information released by regulatory bodies  including the CDC and federal and state organizations. These policies and algorithms were followed during the patient's care in the ED.   Patient with significant history of depression and mental illness comes ED feeling overwhelmed and out of control, not eating, not sleeping, somewhat pressured speech.  Will give oral Ativan, consult psychiatry.  Patient agreeable to staying in the ED.  Not an imminent danger to herself or others, not committable at  this time.  The patient has been placed in psychiatric observation due to the need to provide a safe environment for the patient while obtaining psychiatric consultation and evaluation, as well as ongoing medical and medication management to treat the patient's condition.  The patient has not been placed under full IVC at this time.       ____________________________________________   FINAL CLINICAL IMPRESSION(S) / ED DIAGNOSES    Final diagnoses:  Agitation     ED Discharge Orders    None      Portions of this note were generated with dragon dictation software. Dictation errors may occur despite best attempts at proofreading.   Sharman Cheek, MD 11/04/20 1949

## 2020-11-04 NOTE — ED Notes (Signed)
Pt provided more water and saltine crackers.

## 2020-11-04 NOTE — ED Notes (Signed)
Pt to restroom, this nurse asks pt to provide urine sample. Pt states she already provided sample in triage. This nurse contacts triage nurse who states none was collected and lab reports they never received sample. Will attempt to collect with pts next void.

## 2020-11-04 NOTE — ED Notes (Signed)
Pt states she has had changes in meds with her MDs, has experienced SI in past but has not had any issues last 5 years. Reports that with COVID her meetings changed and her mental health has gradually declined. States her anxiety peaked at home and her counselor could not get her anything until Monday, states she couldn't wait so long so came to ED.

## 2020-11-04 NOTE — BH Assessment (Signed)
Comprehensive Clinical Assessment (CCA) Note  11/05/2020 Sandy Mason [id number] Recommendations for Services/Supports/Treatments: Consulted with Madaline Brilliant., NP, who recommended patient for psychiatric inpatient criteria. Notified Dr. Scotty Court and Thayer Ohm, RN of disposition recommendation. [facility] and other facilities will be contacted for placement.     Sandy Mason is a 45 year old patient who presented to [facility] ED, voluntarily for complaints of anxiety. Pt was alert and oriented x5. It was noted that the pt. was visibly in distress and fidgeting with her hands throughout the interview. Pt presented with an appropriate appearance. Pt was calm and cooperative during the assessment.  Pt's speech was coherent, and her thought processes were relevant. Motor behavior appears restless. Eye contact was good and pt does not appear to be responding to internal stimuli. Pt's mood is anxious; affect is congruent with mood. Patient was noted to have good insight, evidenced by her contacting her psychiatrist and PCP for help prior to presenting to the ED. The patient reports a hx of chronic suicidality for the past 22 years, but denies current thoughts of SI.  Pt reported that she sees a psychiatrist at [facility] and recently had medication adjustments. The pt identified her stressors as the fast pace of her job at [employer], her mother being in stage three dementia, and her daughter "coming out" recently. The patient denied HI or A/V/H. Pt reported that her sleep has decreased, and her appetite is poor. Pt explained that she eats very little and often vomits when she forces herself to eat. Flowsheet Row ED from 11/04/2020 in [facility] ED from 09/23/2020 in [facility] EMERGENCY DEPARTMENT ED from 09/22/2020 in [facility]  C-SSRS RISK CATEGORY Low Risk No Risk No Risk    The patient demonstrates the  following risk factors for suicide: Chronic risk factors for suicide include: psychiatric disorder of MMD. Acute risk factors for suicide include: N/A. Protective factors for this patient include: hope for the future. Considering these factors, the overall suicide risk at this point appears to be low. Patient is not appropriate for outpatient follow up.  Chief Complaint:  Chief Complaint  Patient presents with  . Mental Health Problem   Visit Diagnosis: Severe episode of recurrent major depressive disorder, without psychotic features     CCA Screening, Triage and Referral (STR)  Patient Reported Information How did you hear about [place]? Self  Referral name: Self  Referral phone number: No data recorded  Whom do you see for routine medical problems? Primary Care  Practice/Facility Name: [facility]  Practice/Facility Phone Number: [phone]  Name of Contact: No data recorded Contact Number: No data recorded Contact Fax Number: No data recorded Prescriber Name: No data recorded Prescriber Address (if known): No data recorded  What Is the Reason for Your Visit/Call Today? Anxiety  How Long Has This Been Causing You Problems? 1 wk - 1 month  What Do You Feel Would Help You the Most Today? Stress Management   Have You Recently Been in Any Inpatient Treatment (Hospital/Detox/Crisis Center/28-Day Program)? No  Name/Location of Program/Hospital:No data recorded How Long Were You There? No data recorded When Were You Discharged? No data recorded  Have You Ever Received Services From [facility] Before? No  Who Do You See at [facility]? No data recorded  Have You Recently Had Any Thoughts About Hurting Yourself? No  Are You Planning to Commit Suicide/Harm Yourself At This time? No  Have you Recently Had Thoughts About Hurting Someone Karolee Ohslse? No  Explanation: No data recorded  Have You Used Any Alcohol or Drugs in the Past 24 Hours? No  How Long Ago Did You  Use Drugs or Alcohol? No data recorded What Did You Use and How Much? No data recorded  Do You Currently Have a Therapist/Psychiatrist? Yes  Name of Therapist/Psychiatrist: [facility]   Have You Been Recently Discharged From Any Office Practice or Programs? No  Explanation of Discharge From Practice/Program: No data recorded    CCA Screening Triage Referral Assessment Type of Contact: Face-to-Face  Is this Initial or Reassessment? No data recorded Date Telepsych consult ordered in [facility]:  No data recorded Time Telepsych consult ordered in [facility]:  No data recorded  Patient Reported Information Reviewed? Yes  Patient Left Without Being Seen? No data recorded Reason for Not Completing Assessment: No data recorded  Collateral Involvement: n/a   Does Patient Have a Court Appointed Legal Guardian? No data recorded Name and Contact of Legal Guardian: Self  If Minor and Not Living with Parent(s), Who has Custody? n/a  Is CPS involved or ever been involved? Never  Is APS involved or ever been involved? Never   Patient Determined To Be At Risk for Harm To Self or Others Based on Review of Patient Reported Information or Presenting Complaint? No  Method: No data recorded Availability of Means: No data recorded Intent: No data recorded Notification Required: No data recorded Additional Information for Danger to Others Potential: No data recorded Additional Comments for Danger to Others Potential: No data recorded Are There Guns or Other Weapons in Your Home? No data recorded Types of Guns/Weapons: No data recorded Are These Weapons Safely Secured?                            No data recorded Who Could Verify You Are Able To Have These Secured: No data recorded Do You Have any Outstanding Charges, Pending Court Dates, Parole/Probation? No data recorded Contacted To Inform of Risk of Harm To Self or Others: No data recorded  [facility]   Does Patient  Present under Involuntary Commitment? No  IVC Papers Initial File Date: No data recorded  [place]   Patient Currently Receiving the Following Services: Medication Management   Determination of Need: Emergent (2 hours)   Options For Referral: Inpatient Hospitalization     CCA Biopsychosocial Intake/Chief Complaint:  Anxiety  Current Symptoms/Problems: Anxiety   Patient Reported Schizophrenia/Schizoaffective Diagnosis in Past: No   Strengths: The individual has stable housing for the foreseeable future  Preferences: None reported  Abilities: Able to take care of self; asks for help   Type of Services Patient Feels are Needed: Pt unable to identify services needed.   Initial Clinical Notes/Concerns: Pt noted to be visibly anxious.   Mental Health Symptoms Depression:  Change in energy/activity; Increase/decrease in appetite   Duration of Depressive symptoms: Greater than two weeks   Mania:  None   Anxiety:   Irritability; Fatigue; Tension; Worrying   Psychosis:  None   Duration of Psychotic symptoms: No data recorded  Trauma:  None   Obsessions:  None   Compulsions:  None   Inattention:  Fails to pay attention/makes careless mistakes   Hyperactivity/Impulsivity:  Fidgets with hands/feet   Oppositional/Defiant Behaviors:  None   Emotional Irregularity:  N/A; None   Other Mood/Personality Symptoms:  No data  recorded   Mental Status Exam Appearance and self-care  Stature:  Average   Weight:  Overweight   Clothing:  Casual   Grooming:  Neglected   Cosmetic use:  None   Posture/gait:  Normal   Motor activity:  Restless   Sensorium  Attention:  Normal   Concentration:  Anxiety interferes   Orientation:  Object; Person; Time; Place; Situation   Recall/memory:  Normal   Affect and Mood  Affect:  Anxious   Mood:  Anxious   Relating  Eye contact:  Normal   Facial expression:  Anxious   Attitude toward  examiner:  Cooperative   Thought and Language  Speech flow: Normal   Thought content:  Appropriate to Mood and Circumstances   Preoccupation:  Ruminations   Hallucinations:  None   Organization:  No data recorded  [employer] of Knowledge:  Average   Intelligence:  Average   Abstraction:  Normal   Judgement:  Good   Reality Testing:  Adequate   Insight:  Good   Decision Making:  Paralyzed   Social Functioning  Social Maturity:  Responsible   Social Judgement:  Normal   Stress  Stressors:  Family conflict; Relationship   Coping Ability:  Human resources officer Deficits:  Self-control   Supports:  Support needed     Religion:    Leisure/Recreation:    Exercise/Diet: Exercise/Diet Do You Follow a Special Diet?: No Do You Have Any Trouble Sleeping?: Yes Explanation of Sleeping Difficulties: Anxiety interferes with pts' speech.   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  Childhood History:  Childhood History By whom was/is the patient raised?: Both parents  Child/Adolescent Assessment:     CCA Substance  Use Alcohol/Drug Use: Alcohol / Drug Use Pain Medications: See PTA Prescriptions: See PTA Over the Counter: See PTA History of alcohol / drug use?: No history of alcohol / drug abuse Longest period of sobriety (when/how long): n/a                         ASAM's:  Six Dimensions of Multidimensional Assessment  Dimension 1:  Acute Intoxication and/or Withdrawal Potential:      Dimension 2:  Biomedical Conditions and Complications:      Dimension 3:  Emotional, Behavioral, or Cognitive Conditions and Complications:     Dimension 4:  Readiness to Change:     Dimension 5:  Relapse, Continued use, or Continued Problem Potential:     Dimension 6:  Recovery/Living Environment:     ASAM Severity Score:    ASAM Recommended Level of Treatment:     Substance use Disorder (SUD)    Recommendations for Services/Supports/Treatments:    DSM5 Diagnoses: Patient Active Problem  List   Diagnosis Date Noted  . Major depressive disorder, recurrent severe without psychotic features (HCC) 04/11/2017  . Salicylate overdose 04/09/2017  . AKI (acute kidney injury) (HCC) 04/03/2016  . Suicidal behavior 01/16/2016  . Alcohol intoxication (HCC)   . Severe episode of recurrent major depressive disorder, without psychotic features (HCC)   . Malingering 12/16/2015  . Major depressive disorder, recurrent episode, moderate (HCC) 12/12/2015  . Closed pelvic fracture (HCC) 10/31/2015  . Tobacco abuse 10/31/2015  . Tobacco use disorder 03/28/2015  . PTSD (post-traumatic stress disorder) 03/28/2015  . Stimulant use disorder (cocaine) 03/28/2015  . 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 Opioid use disorder, severe, dependence (HCC) 12/20/2014  . Alcohol use disorder severe. 12/18/2014  . Borderline personality disorder (HCC) 12/18/2014    Trevaun Rendleman R Ronda, LCAS

## 2020-11-05 ENCOUNTER — Inpatient Hospital Stay (HOSPITAL_COMMUNITY)
Admission: RE | Admit: 2020-11-05 | Discharge: 2020-11-11 | DRG: 885 | Disposition: A | Discharge status: Home / Self Care | Payer: No Typology Code available for payment source | Source: Intra-hospital | Attending: Psychiatry | Admitting: Psychiatry

## 2020-11-05 DIAGNOSIS — Z79899 Other long term (current) drug therapy: Secondary | ICD-10-CM | POA: Diagnosis not present

## 2020-11-05 DIAGNOSIS — F332 Major depressive disorder, recurrent severe without psychotic features: Secondary | ICD-10-CM | POA: Diagnosis present

## 2020-11-05 DIAGNOSIS — R45851 Suicidal ideations: Secondary | ICD-10-CM | POA: Diagnosis present

## 2020-11-05 DIAGNOSIS — Z8673 Personal history of transient ischemic attack (TIA), and cerebral infarction without residual deficits: Secondary | ICD-10-CM | POA: Diagnosis not present

## 2020-11-05 DIAGNOSIS — G4700 Insomnia, unspecified: Secondary | ICD-10-CM | POA: Diagnosis present

## 2020-11-05 DIAGNOSIS — Z818 Family history of other mental and behavioral disorders: Secondary | ICD-10-CM

## 2020-11-05 DIAGNOSIS — F909 Attention-deficit hyperactivity disorder, unspecified type: Secondary | ICD-10-CM | POA: Diagnosis present

## 2020-11-05 DIAGNOSIS — Z9151 Personal history of suicidal behavior: Secondary | ICD-10-CM

## 2020-11-05 DIAGNOSIS — Z881 Allergy status to other antibiotic agents status: Secondary | ICD-10-CM | POA: Diagnosis not present

## 2020-11-05 DIAGNOSIS — F17210 Nicotine dependence, cigarettes, uncomplicated: Secondary | ICD-10-CM | POA: Diagnosis present

## 2020-11-05 DIAGNOSIS — Z888 Allergy status to other drugs, medicaments and biological substances status: Secondary | ICD-10-CM | POA: Diagnosis not present

## 2020-11-05 DIAGNOSIS — F603 Borderline personality disorder: Secondary | ICD-10-CM | POA: Diagnosis present

## 2020-11-05 DIAGNOSIS — Z886 Allergy status to analgesic agent status: Secondary | ICD-10-CM | POA: Diagnosis not present

## 2020-11-05 DIAGNOSIS — F4310 Post-traumatic stress disorder, unspecified: Secondary | ICD-10-CM | POA: Diagnosis present

## 2020-11-05 DIAGNOSIS — R451 Restlessness and agitation: Secondary | ICD-10-CM | POA: Diagnosis not present

## 2020-11-05 LAB — URINE DRUG SCREEN, QUALITATIVE (ARMC ONLY)
Amphetamines, Ur Screen: POSITIVE — AB
Barbiturates, Ur Screen: NOT DETECTED
Benzodiazepine, Ur Scrn: NOT DETECTED
Cannabinoid 50 Ng, Ur ~~LOC~~: POSITIVE — AB
Cocaine Metabolite,Ur ~~LOC~~: POSITIVE — AB
MDMA (Ecstasy)Ur Screen: NOT DETECTED
Methadone Scn, Ur: NOT DETECTED
Opiate, Ur Screen: NOT DETECTED
Phencyclidine (PCP) Ur S: NOT DETECTED
Tricyclic, Ur Screen: POSITIVE — AB

## 2020-11-05 MED ORDER — LORAZEPAM 1 MG PO TABS
1.0000 mg | ORAL_TABLET | Freq: Once | ORAL | Status: AC
  Administered 2020-11-05: 1 mg via ORAL
  Filled 2020-11-05: qty 1

## 2020-11-05 MED ORDER — ALUM & MAG HYDROXIDE-SIMETH 200-200-20 MG/5ML PO SUSP
30.0000 mL | ORAL | Status: DC | PRN
  Filled 2020-11-05 (×2): qty 30

## 2020-11-05 MED ORDER — POTASSIUM CHLORIDE CRYS ER 20 MEQ PO TBCR
40.0000 meq | EXTENDED_RELEASE_TABLET | Freq: Once | ORAL | Status: AC
  Administered 2020-11-05: 40 meq via ORAL
  Filled 2020-11-05 (×2): qty 2

## 2020-11-05 MED ORDER — HYDROXYZINE HCL 25 MG PO TABS
25.0000 mg | ORAL_TABLET | Freq: Three times a day (TID) | ORAL | Status: DC
  Administered 2020-11-06 (×2): 25 mg via ORAL
  Filled 2020-11-05 (×12): qty 1

## 2020-11-05 MED ORDER — GABAPENTIN 300 MG PO CAPS
900.0000 mg | ORAL_CAPSULE | Freq: Three times a day (TID) | ORAL | Status: DC
  Filled 2020-11-05: qty 3

## 2020-11-05 MED ORDER — MAGNESIUM HYDROXIDE 400 MG/5ML PO SUSP: 30.0000 mL | Freq: Every day | ORAL | Status: DC | PRN

## 2020-11-05 MED ORDER — VENLAFAXINE HCL ER 150 MG PO CP24
300.0000 mg | ORAL_CAPSULE | Freq: Every day | ORAL | Status: DC
  Administered 2020-11-06 – 2020-11-11 (×6): 300 mg via ORAL
  Filled 2020-11-05 (×10): qty 2

## 2020-11-05 MED ORDER — MIRTAZAPINE 15 MG PO TABS
15.0000 mg | ORAL_TABLET | Freq: Every day | ORAL | Status: DC
  Administered 2020-11-05 – 2020-11-10 (×6): 15 mg via ORAL
  Filled 2020-11-05 (×10): qty 1

## 2020-11-05 MED ORDER — LIDOCAINE VISCOUS HCL 2 % MT SOLN
15.0000 mL | Freq: Once | OROMUCOSAL | Status: AC
  Administered 2020-11-05: 15 mL via ORAL
  Filled 2020-11-05: qty 15

## 2020-11-05 MED ORDER — GABAPENTIN 300 MG PO CAPS
900.0000 mg | ORAL_CAPSULE | Freq: Three times a day (TID) | ORAL | Status: DC
  Administered 2020-11-05 – 2020-11-11 (×18): 900 mg via ORAL
  Filled 2020-11-05 (×28): qty 3

## 2020-11-05 MED ORDER — ALUM & MAG HYDROXIDE-SIMETH 200-200-20 MG/5ML PO SUSP
30.0000 mL | Freq: Once | ORAL | Status: AC
  Administered 2020-11-05: 30 mL via ORAL
  Filled 2020-11-05: qty 30

## 2020-11-05 MED ORDER — LISDEXAMFETAMINE DIMESYLATE 50 MG PO CAPS: 50.0000 mg | ORAL_CAPSULE | Freq: Every morning | ORAL | Status: DC

## 2020-11-05 MED ORDER — AMPHETAMINE-DEXTROAMPHETAMINE 5 MG PO TABS
20.0000 mg | ORAL_TABLET | Freq: Every day | ORAL | Status: DC
  Administered 2020-11-05: 20 mg via ORAL
  Filled 2020-11-05: qty 4

## 2020-11-05 MED ORDER — ACETAMINOPHEN 325 MG PO TABS
650.0000 mg | ORAL_TABLET | Freq: Four times a day (QID) | ORAL | Status: DC | PRN
  Administered 2020-11-06 – 2020-11-11 (×12): 650 mg via ORAL
  Filled 2020-11-05 (×12): qty 2

## 2020-11-05 MED ORDER — HYDROXYZINE HCL 25 MG PO TABS
25.0000 mg | ORAL_TABLET | Freq: Once | ORAL | Status: DC
  Filled 2020-11-05: qty 1

## 2020-11-05 NOTE — ED Notes (Signed)
Patient asked for a [place] tray. No [place] trays available. Patient was given an ice cream. Patient is waiting for transport to [facility] which should occur after 1930. Patient is calm and cooperative at this time.

## 2020-11-05 NOTE — ED Notes (Signed)
Pt requesting her adderall and vyvanse. RN looking into that.  Pt laying in bed. Tech gave pt dinner tray.

## 2020-11-05 NOTE — ED Notes (Signed)
Hourly rounding completed at this time, patient currently asleep in room. No complaints, stable, and in no acute distress. Q15 minute rounds and monitoring via Security Cameras to continue. 

## 2020-11-05 NOTE — ED Notes (Signed)
EMTALA reviewed by this RN.  

## 2020-11-05 NOTE — ED Provider Notes (Signed)
Emergency Medicine Observation Re-evaluation Note  Sandy Mason is a 45 y.o. female, seen on rounds today.  Pt initially presented to the ED for complaints of Mental Health Problem Currently, the patient is sleeping.  Physical Exam  BP (!) 134/95 (BP Location: Left Arm)   Pulse (!) 108   Temp 98.6 F (37 C) (Oral)   Resp 18   Ht 5\' 8"  (1.727 m)   Wt 99.8 kg   SpO2 98%   BMI 33.45 kg/m  Physical Exam Constitutional:      Appearance: She is not ill-appearing or toxic-appearing.  HENT:     Head: Atraumatic.  Cardiovascular:     Comments: Well perfused Pulmonary:     Effort: Pulmonary effort is normal.  Abdominal:     General: There is no distension.  Musculoskeletal:        General: No deformity.  Skin:    Findings: No rash.  Neurological:     General: No focal deficit present.     Cranial Nerves: No cranial nerve deficit.      ED Course / MDM  EKG:   I have reviewed the labs performed to date as well as medications administered while in observation.  Recent changes in the last 24 hours include psychiatric NP overnight evaluation recommending inpatient care.  Plan  Current plan is for inpatient psychiatric care. Patient is not under full IVC at this time.   , MD 11/05/20 [phone]

## 2020-11-05 NOTE — ED Notes (Addendum)
Patient is aware that she is being transferred after 1930 tonight. Patient was requesting her ADHD meds, Vyvanse and Adderall. MD informed. Patient was given a Sprite and is requesting Saltine crackers.

## 2020-11-05 NOTE — ED Notes (Addendum)
Hourly rounding completed at this time, patient currently awake in restroom. No complaints, stable, and in no acute distress. Q15 minute rounds and monitoring via Security Cameras to continue. 

## 2020-11-05 NOTE — Progress Notes (Signed)
Potassium 2.9 on 11/04/20. It does not appear that potassium was replaced while in the ED. Will give potassium 40 mEq oral x 1 dose tonight and check BMP in the morning.

## 2020-11-05 NOTE — ED Notes (Signed)
Referral information for Psychiatric Hospitalization faxed to;   . [facility] ([phone]-or- [phone]),   . [facility] ([phone]) [phone]  . [facility] ([phone], [phone], [phone] or [phone]),   . [facility] ([phone] or [phone])  . [facility] ([phone]),   . [facility] ([phone] -or- [phone]),   . [facility] ([phone])   

## 2020-11-05 NOTE — ED Notes (Addendum)
Patient is calm, lying down watching TV. Patient was given Sprite to take her meds.. Patient requested Effexor from her home med list. Dr. Derrill Kay aware.

## 2020-11-05 NOTE — Consult Note (Signed)
[facility] Face-to-Face Psychiatry Consult   Reason for Consult: Mental Health Problem Referring Physician: Dr. Scotty Court Patient Identification: Sandy Mason MRN:  [id number] Principal Diagnosis: <principal problem not specified> Diagnosis:  Active Problems:   Borderline personality disorder (HCC)   Tobacco use disorder   PTSD (post-traumatic stress disorder)   Stimulant use disorder (cocaine)   Major depressive disorder, recurrent episode, moderate (HCC)   Malingering   Suicidal behavior   Major depressive disorder, recurrent severe without psychotic features (HCC)   Total Time spent with patient: 30 minutes  Subjective: " I have been suicidal for 20 years.  5 years ago I tried walking in and out of traffic. I feel like I am getting back to that state." Sandy Mason is a 45 y.o. female patient  presented to [facility] ED via POV and placed under involuntary commitment status (IVC).  Per the ED triage nurse note, Patient reports anxiety and nerve issues, getting 1-2 hours of sleep a night, not eating. Currently still taking behavioral medications. Reports recent change in medications the past month. Also reports personal and financial issues recently. Denies SI/HI.  The patient was seen face-to-face by this provider; the chart was reviewed and consulted with Dr. Scotty Court on 11/04/2020 due to the patient's care. It was discussed with the EDP that the patient does meet the criteria to be admitted to the inpatient unit. The patient has a history of malingering.  On evaluation, the patient reports alert and oriented x4, anxious, but cooperative, and mood-congruent with affect. It was noted that the patient was visibly in distress and fidgeting with her hands throughout the interview. The patient presented with an appropriate appearance. The patient was cooperative during the assessment. The patient's speech was coherent, and her thought processes were relevant. Motor behavior appears restless. Eye  contact was good, and the patient did not appear to be responding to internal stimuli. The patient's mood is anxious;affect is congruent with mood.  The patient does not appear to be responding to internal or external stimuli. Neither is the patient presenting with any delusional thinking. The patient is not presenting with any psychotic or paranoid behaviors. The patient denies auditory or visual hallucinations. The patient denies suicidal ideation, but she entertained the conversation as though she could flip in and out of being suicidal. The patient expressed she had been suicidal for 22 years, and five years ago, she began walking in and out of traffic, hoping she would get hit by a car. The patient expressed she is starting to feel like "I did five years ago when I was depressed homicidal, or self-harm ideations." During an encounter with the patient, she was able to answer questions appropriately.  Plan: The patient is a safety risk to herself and requires psychiatric inpatient admission for stabilization and treatment.   HPI: Per Dr. Scotty Court, Sandy Mason is a 45 y.o. female with a history of alcohol abuse depression seizures and prior suicide attempts who comes ED complaining of anxiety, feeling like her life is spiraling out of control over the past 2 weeks.  Not eating, not sleeping, feeling nauseated.  Denies any pain fever vomiting. Reports the symptoms started when she had to start titrating off of her Rexulti.  2 weeks ago, she started having involuntary jaw spasms which sound like a dystonic reaction.  She called her doctor who told her to decrease the Rexulti from 2 mg daily to 1 mg daily.  However, a week ago she had a similar reaction so  she stopped taking it altogether.  During this time her psychiatric symptoms have continued worsening.  Currently denies SI HI or hallucinations.    Past Psychiatric History: Risk to Self:   Risk to Others:   Prior Inpatient Therapy:    Prior Outpatient Therapy:    Past Medical History:  Past Medical History:  Diagnosis Date  . Adult ADHD 05/2020  . Alcohol use disorder severe. 12/18/2014  . Anxiety   . Borderline personality disorder (HCC) 12/18/2014  . CVA (cerebral infarction)   . Depression   . 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 Opioid use disorder, severe, dependence (HCC) 12/20/2014  . PTSD (post-traumatic stress disorder) 03/28/2015  . 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 Seizures (HCC)   . Suicide attempt by hanging [facility](HCC)     Past Surgical History:  Procedure Laterality Date  . NO PAST SURGERIES     Family History:  Family History  Problem Relation Age of Onset  . Diabetes Father   . Dementia Mother   . Anxiety disorder Mother    Family Psychiatric  History:  Social History:  Social History   Substance and Sexual Activity  Alcohol Use Yes   Comment: socially     Social History   Substance and Sexual Activity  Drug Use Yes  . Types: Benzodiazepines, Hydrocodone   Comment: prescribed vivance and adderol    Social History   Socioeconomic History  . Marital status: Single    Spouse name: Not on file  . Number of children: Not on file  . Years of education: Not on file  . Highest education level: Not on file  Occupational History  . Not on file  Tobacco Use  . Smoking status: Current Every Day Smoker    Packs/day: 0.00    Years: 20.00    Pack years: 0.00    Types: Cigarettes  . Smokeless tobacco: Never Used  . Tobacco comment: 1-2 cigarettes per day  Vaping Use  . Vaping Use: Never used  Substance and Sexual Activity  . Alcohol use: Yes    Comment: socially  . Drug use: Yes    Types: Benzodiazepines, Hydrocodone    Comment: prescribed vivance and adderol  . Sexual activity: Yes    Birth control/protection: I.U.D.  Other Topics Concern  . Not on file  Social History Narrative  .  Not on file   Social Determinants of Health   Financial Resource Strain: Not on file  Food Insecurity: Not on file  Transportation Needs: Not on file  Physical Activity: Not on file  Stress: Not on file  Social Connections: Not on file   Additional Social History:    Allergies:   Allergies  Allergen Reactions  . Depakote [Valproic Acid] Anaphylaxis and Other (See Comments)    Reaction:  Seizures   . Haldol [Haloperidol Lactate] Anaphylaxis and Other (See Comments)    Reaction:  Seizures   . Buspar [Buspirone] Other (See Comments)    "lock jaw"  . Meloxicam Other (See Comments) and Swelling    Ankle swelling and reflex  . Keflet [Cephalexin] Rash    Labs:  Results for orders placed or performed during the hospital encounter of 11/04/20 (from the past 48 hour(s))  Comprehensive metabolic panel     Status: Abnormal   Collection Time: 11/04/20  6:53 PM  Result Value Ref Range   Sodium 138 135 - 145 mmol/L   Potassium 2.9 (L)  3.5 - 5.1 mmol/L   Chloride 105 98 - 111 mmol/L   CO2 23 22 - 32 mmol/L   Glucose, Bld 143 (H) 70 - 99 mg/dL    Comment: Glucose reference range applies only to samples taken after fasting for at least 8 hours.   BUN 10 6 - 20 mg/dL   Creatinine, Ser 0.76 0.44 - 1.00 mg/dL   Calcium 9.2 8.9 - 22.6 mg/dL   Total Protein 7.9 6.5 - 8.1 g/dL   Albumin 4.2 3.5 - 5.0 g/dL   AST 24 15 - 41 U/L   ALT 25 0 - 44 U/L   Alkaline Phosphatase 60 38 - 126 U/L   Total Bilirubin 0.3 0.3 - 1.2 mg/dL   GFR, Estimated >33 >35 mL/min    Comment: (NOTE) Calculated using the CKD-EPI Creatinine Equation (2021)    Anion gap 10 5 - 15    Comment: Performed at [facility], [address] [place], [place] [address]  Ethanol     Status: None   Collection Time: 11/04/20  6:53 PM  Result Value Ref Range   Alcohol, Ethyl (B) <10 <10 mg/dL    Comment: (NOTE) Lowest detectable limit for serum alcohol is 10 mg/dL.  For medical purposes only. Performed at  [facility], [address] [place], [place] [address]   Salicylate level     Status: None   Collection Time: 11/04/20  6:53 PM  Result Value Ref Range   Salicylate Lvl 14.3 7.0 - 30.0 mg/dL    Comment: Performed at [facility], [address] [place], [place] [address]  Acetaminophen level     Status: Abnormal   Collection Time: 11/04/20  6:53 PM  Result Value Ref Range   Acetaminophen (Tylenol), Serum <10 (L) 10 - 30 ug/mL    Comment: (NOTE) Therapeutic concentrations vary significantly. A range of 10-30 ug/mL  may be an effective concentration for many patients. However, some  are best treated at concentrations outside of this range. Acetaminophen concentrations >150 ug/mL at 4 hours after ingestion  and >50 ug/mL at 12 hours after ingestion are often associated with  toxic reactions.  Performed at [facility], [address] [place], [place] [address]   cbc     Status: Abnormal   Collection Time: 11/04/20  6:53 PM  Result Value Ref Range   WBC 15.8 (H) 4.0 - 10.5 K/uL   RBC 4.38 3.87 - 5.11 MIL/uL   Hemoglobin 12.6 12.0 - 15.0 g/dL   HCT 57.2 62.0 - 35.5 %   MCV 84.9 80.0 - 100.0 fL   MCH 28.8 26.0 - 34.0 pg   MCHC 33.9 30.0 - 36.0 g/dL   RDW 97.4 16.3 - 84.5 %   Platelets 272 150 - 400 K/uL   nRBC 0.0 0.0 - 0.2 %    Comment: Performed at [facility], [address] [place], [place] [address]  Resp Panel by RT-PCR (Flu A&B, Covid) Nasopharyngeal Swab     Status: None   Collection Time: 11/04/20  8:05 PM   Specimen: Nasopharyngeal Swab; Nasopharyngeal(NP) swabs in vial transport medium  Result Value Ref Range   SARS Coronavirus 2 by RT PCR NEGATIVE NEGATIVE    Comment: (NOTE) SARS-CoV-2 target nucleic acids are NOT DETECTED.  The SARS-CoV-2 RNA is generally detectable in upper respiratory specimens during the acute phase of infection. The lowest concentration of SARS-CoV-2 viral copies this assay can detect is 138  copies/mL. A negative result  does not preclude SARS-Cov-2 infection and should not be used as the sole basis for treatment or other patient management decisions. A negative result may occur with  improper specimen collection/handling, submission of specimen other than nasopharyngeal swab, presence of viral mutation(s) within the areas targeted by this assay, and inadequate number of viral copies(<138 copies/mL). A negative result must be combined with clinical observations, patient history, and epidemiological information. The expected result is Negative.  Fact Sheet for Patients:  BloggerCourse.com  Fact Sheet for Healthcare Providers:  SeriousBroker.it  This test is no t yet approved or cleared by the [place] FDA and  has been authorized for detection and/or diagnosis of SARS-CoV-2 by FDA under an Emergency Use Authorization (EUA). This EUA will remain  in effect (meaning this test can be used) for the duration of the COVID-19 declaration under Section 564(b)(1) of the Act, 21 U.S.C.section 360bbb-3(b)(1), unless the authorization is terminated  or revoked sooner.       Influenza A by PCR NEGATIVE NEGATIVE   Influenza B by PCR NEGATIVE NEGATIVE    Comment: (NOTE) The Xpert Xpress SARS-CoV-2/FLU/RSV plus assay is intended as an aid in the diagnosis of influenza from Nasopharyngeal swab specimens and should not be used as a sole basis for treatment. Nasal washings and aspirates are unacceptable for Xpert Xpress SARS-CoV-2/FLU/RSV testing.  Fact Sheet for Patients: BloggerCourse.com  Fact Sheet for Healthcare Providers: SeriousBroker.it  This test is not yet approved or cleared by the [place] FDA and has been authorized for detection and/or diagnosis of SARS-CoV-2 by FDA under an Emergency Use Authorization (EUA). This EUA will remain in effect (meaning this test  can be used) for the duration of the COVID-19 declaration under Section 564(b)(1) of the Act, 21 U.S.C. section 360bbb-3(b)(1), unless the authorization is terminated or revoked.  Performed at [facility], [address], [place], [place] [address]     Current Facility-Administered Medications  Medication Dose Route Frequency Provider Last Rate Last Admin  . acetaminophen (TYLENOL) tablet 1,000 mg  1,000 mg Oral Q6H PRN Sharman Cheek, MD   1,000 mg at 11/04/20 2043  . ondansetron (ZOFRAN) tablet 4 mg  4 mg Oral Q8H PRN Sharman Cheek, MD   4 mg at 11/04/20 2042   Current Outpatient Medications  Medication Sig Dispense Refill  . acetaminophen (TYLENOL) 325 MG tablet Take by mouth every 6 (six) hours as needed.     . ciprofloxacin (CIPRO) 500 MG tablet TAKE ONE TABLET BY MOUTH 2 TIMES A DAY FOR 7 DAYS 14 tablet 0  . diphenhydrAMINE (BENADRYL ALLERGY) 25 MG tablet Take 1 tablet (25 mg total) by mouth every 6 (six) hours as needed. 30 tablet 0  . famotidine (PEPCID) 20 MG tablet Take 1 tablet (20 mg total) by mouth 2 (two) times daily. 60 tablet 1  . gabapentin (NEURONTIN) 300 MG capsule Take 900 mg by mouth 3 (three) times daily.    . hydrOXYzine (ATARAX/VISTARIL) 25 MG tablet Take 25 mg by mouth 2 (two) times daily as needed.    Marland Kitchen ibuprofen (ADVIL) 600 MG tablet TAKE ONE TABLET BY MOUTH EVERY 8 HOURS AS NEEDED FOR MODERATE PAIN 20 tablet 0  . levonorgestrel (MIRENA) 20 MCG/24HR IUD 1 each by Intrauterine route once.    . mirtazapine (REMERON) 30 MG tablet Take 30 mg by mouth at bedtime.    . naloxone (NARCAN) nasal spray 4 mg/0.1 mL Nasal spray in case of heroin overdose again 1 each 1  . naproxen (  NAPROSYN) 500 MG tablet Take 1 tablet (500 mg total) by mouth 2 (two) times daily with a meal. 60 tablet 0  . ondansetron (ZOFRAN-ODT) 4 MG disintegrating tablet TAKE ONE TABLET BY MOUTH EVERY 8 HOURS AS NEEDED FOR NAUSEA OR VOMITING 12 tablet 0  . pantoprazole (PROTONIX) 20 MG  tablet Take 1 tablet (20 mg total) by mouth daily. 30 tablet 1  . pseudoephedrine (SUDAFED) 120 MG 12 hr tablet Take 1 tablet (120 mg total) by mouth 2 (two) times daily as needed for congestion. 20 tablet 2  . REXULTI 2 MG TABS tablet Take 2 mg by mouth daily.    . sucralfate (CARAFATE) 1 g tablet Take 1 tablet (1 g total) by mouth 4 (four) times daily for 15 days. 60 tablet 0  . triamcinolone ointment (KENALOG) 0.5 % Apply 1 application topically 2 (two) times daily. 30 g 0  . venlafaxine XR (EFFEXOR-XR) 150 MG 24 hr capsule Take 300 mg by mouth daily with breakfast.       Musculoskeletal: Strength & Muscle Tone: within normal limits Gait & Station: normal Patient leans: N/A  Psychiatric Specialty Exam:  Presentation  General Appearance: Appropriate for Environment  Eye Contact:Good  Speech:Clear and Coherent  Speech Volume:Normal  Handedness:Right   Mood and Affect  Mood:Euthymic  Affect:Appropriate; Congruent   Thought Process  Thought Processes:Coherent  Descriptions of Associations:Intact  Orientation:Full (Time, Place and Person)  Thought Content:Logical; Tangential  History of Schizophrenia/Schizoaffective disorder:No  Duration of Psychotic Symptoms:No data recorded Hallucinations:Hallucinations: None  Ideas of Reference:None  Suicidal Thoughts:Suicidal Thoughts: No  Homicidal Thoughts:Homicidal Thoughts: No   Sensorium  Memory:Immediate Good; Recent Good; Remote Good  Judgment:Fair  Insight:Fair   Executive Functions  Concentration:Good  Attention Span:Good  Recall:Good  Fund of Knowledge:Good  Language:Good   Psychomotor Activity  Psychomotor Activity:Psychomotor Activity: Normal   Assets  Assets:Desire for Improvement; Housing; Resilience; Social Support   Sleep  Sleep:Sleep: Poor   Physical Exam: Physical Exam Constitutional:      Appearance: Normal appearance.  HENT:     Nose: Nose normal.     Mouth/Throat:      Mouth: Mucous membranes are moist.  Cardiovascular:     Rate and Rhythm: Tachycardia present.  Pulmonary:     Effort: Pulmonary effort is normal.  Musculoskeletal:        General: Normal range of motion.     Cervical back: Normal range of motion and neck supple.  Neurological:     General: No focal deficit present.     Mental Status: She is alert and oriented to person, place, and time. Mental status is at baseline.  Psychiatric:        Attention and Perception: Attention and perception normal.        Mood and Affect: Mood is anxious and depressed.        Speech: Speech is tangential.        Behavior: Behavior normal. Behavior is cooperative.        Thought Content: Thought content normal.        Cognition and Memory: Cognition and memory normal.        Judgment: Judgment normal.    Review of Systems  Psychiatric/Behavioral: Positive for depression. The patient is nervous/anxious and has insomnia.   All other systems reviewed and are negative.  Blood pressure (!) 134/95, pulse (!) 108, temperature 98.6 F (37 C), temperature source Oral, resp. rate 18, height  (1.727 m), weight 99.8 kg, SpO2 98 %.  Body mass index is 33.45 kg/m.  Treatment Plan Summary: Plan The patient is a safety risk to herself and requires psychiatric inpatient admission for stabilization and treatment.  Disposition: Recommend psychiatric Inpatient admission when medically cleared. Supportive therapy provided about ongoing stressors. Discussed crisis plan, support from social network, calling 911, coming to the Emergency Department, and calling Suicide Hotline.  Gillermo Murdoch, NP 11/05/2020 12:54 AM

## 2020-11-05 NOTE — ED Notes (Signed)
Report given to Chris RN

## 2020-11-05 NOTE — ED Notes (Signed)
This RN confirmed with Carollee Herter, RN the receiving RN for patient that Eugene Gavia, NP is the accepting provider for the patient.

## 2020-11-05 NOTE — ED Notes (Signed)
Pt again does not provide urine sample and again is reminded of need for sample. Pt to desk, requests medication for indigestion.

## 2020-11-05 NOTE — ED Notes (Signed)
Patient has been accepted to [facility].  Patient assigned to room 304 Bed 2 Accepting physician is Gillermo Murdoch, NP.  Call report to [phone].  Representative was [employer].   ER Staff is aware of it:  [facility] ER Secretary  Dr. Katrinka Blazing, ER MD  Lamar Laundry Patient's Nurse

## 2020-11-05 NOTE — ED Notes (Signed)
Pt asleep at this time, unable to collect vitals. Will collect pt vitals once awake. 

## 2020-11-06 ENCOUNTER — Other Ambulatory Visit: Payer: Self-pay

## 2020-11-06 ENCOUNTER — Encounter (HOSPITAL_COMMUNITY): Payer: Self-pay | Admitting: Behavioral Health

## 2020-11-06 DIAGNOSIS — F332 Major depressive disorder, recurrent severe without psychotic features: Secondary | ICD-10-CM | POA: Diagnosis not present

## 2020-11-06 LAB — BASIC METABOLIC PANEL
Anion gap: 11 (ref 5–15)
BUN: 9 mg/dL (ref 6–20)
CO2: 25 mmol/L (ref 22–32)
Calcium: 9.3 mg/dL (ref 8.9–10.3)
Chloride: 101 mmol/L (ref 98–111)
Creatinine, Ser: 0.65 mg/dL (ref 0.44–1.00)
GFR, Estimated: >60 mL/min (ref >60)
Glucose, Bld: 122 mg/dL — ABNORMAL HIGH (ref 70–99)
Potassium: 3.7 mmol/L (ref 3.5–5.1)
Sodium: 137 mmol/L (ref 135–145)

## 2020-11-06 LAB — LIPID PANEL
Cholesterol: 173 mg/dL (ref 0–200)
HDL: 39 mg/dL — ABNORMAL LOW (ref >40)
LDL Cholesterol: 99 mg/dL (ref 0–99)
Total CHOL/HDL Ratio: 4.4 RATIO
Triglycerides: 175 mg/dL — ABNORMAL HIGH (ref <150)
VLDL: 35 mg/dL (ref 0–40)

## 2020-11-06 LAB — HEMOGLOBIN A1C
Hgb A1c MFr Bld: 5.6 % (ref 4.8–5.6)
Mean Plasma Glucose: 114.02 mg/dL

## 2020-11-06 LAB — TSH: TSH: 2.297 u[IU]/mL (ref 0.350–4.500)

## 2020-11-06 MED ORDER — ARIPIPRAZOLE 5 MG PO TABS
5.0000 mg | ORAL_TABLET | Freq: Every day | ORAL | Status: DC
  Administered 2020-11-06: 5 mg via ORAL
  Filled 2020-11-06 (×5): qty 1

## 2020-11-06 MED ORDER — ADULT MULTIVITAMIN W/MINERALS CH
1.0000 | ORAL_TABLET | Freq: Every day | ORAL | Status: DC
  Administered 2020-11-06 – 2020-11-11 (×6): 1 via ORAL
  Filled 2020-11-06 (×10): qty 1

## 2020-11-06 MED ORDER — TRAZODONE HCL 50 MG PO TABS
50.0000 mg | ORAL_TABLET | Freq: Every evening | ORAL | Status: DC | PRN
  Administered 2020-11-06 – 2020-11-10 (×5): 50 mg via ORAL
  Filled 2020-11-06 (×4): qty 1

## 2020-11-06 MED ORDER — LORAZEPAM 1 MG PO TABS
1.0000 mg | ORAL_TABLET | Freq: Once | ORAL | Status: AC | PRN
  Administered 2020-11-06: 1 mg via ORAL
  Filled 2020-11-06: qty 1

## 2020-11-06 MED ORDER — NICOTINE 21 MG/24HR TD PT24
21.0000 mg | MEDICATED_PATCH | Freq: Every day | TRANSDERMAL | Status: DC
  Administered 2020-11-06 – 2020-11-11 (×6): 21 mg via TRANSDERMAL
  Filled 2020-11-06 (×9): qty 1

## 2020-11-06 MED ORDER — TRAZODONE HCL 50 MG PO TABS
50.0000 mg | ORAL_TABLET | Freq: Every evening | ORAL | Status: DC | PRN
  Administered 2020-11-06: 50 mg via ORAL
  Filled 2020-11-06 (×2): qty 1

## 2020-11-06 MED ORDER — OXCARBAZEPINE 150 MG PO TABS
75.0000 mg | ORAL_TABLET | Freq: Two times a day (BID) | ORAL | Status: DC
  Administered 2020-11-06 – 2020-11-08 (×4): 75 mg via ORAL
  Filled 2020-11-06 (×8): qty 0.5

## 2020-11-06 MED ORDER — ONDANSETRON 4 MG PO TBDP
8.0000 mg | ORAL_TABLET | Freq: Three times a day (TID) | ORAL | Status: DC | PRN
  Administered 2020-11-06 – 2020-11-10 (×8): 8 mg via ORAL
  Filled 2020-11-06 (×8): qty 2

## 2020-11-06 MED ORDER — ENSURE ENLIVE PO LIQD
237.0000 mL | ORAL | Status: DC
  Administered 2020-11-08: 237 mL via ORAL
  Filled 2020-11-06 (×7): qty 237

## 2020-11-06 MED ORDER — WHITE PETROLATUM EX OINT
TOPICAL_OINTMENT | CUTANEOUS | Status: AC
  Filled 2020-11-06: qty 5

## 2020-11-06 NOTE — Progress Notes (Addendum)
   11/06/20 0025  Psych Admission Type (Psych Patients Only)  Admission Status Voluntary  Psychosocial Assessment  Patient Complaints Nervousness;Depression;Anxiety  Eye Contact Brief  Facial Expression Anxious  Affect Appropriate to circumstance  Speech Logical/coherent  Interaction Other (Comment)  Motor Activity Other (Comment) (Repetitive motion between left thumb and pinky finger)  Appearance/Hygiene In scrubs  Behavior Characteristics Cooperative  Mood Depressed  Thought Process  Coherency WDL  Content Blaming others  Delusions None reported or observed  Perception WDL  Hallucination None reported or observed  Judgment Impaired  Confusion None  Danger to Self  Current suicidal ideation? Passive  Self-Injurious Behavior Some self-injurious ideation observed or expressed.  No lethal plan expressed   Agreement Not to Harm Self Yes  Danger to Others  Danger to Others None reported or observed   45 yo female pt voluntarily admitted to Adult unit at [facility]. Pt is alert and oriented x 4. Calm and cooperative during assessment. Pt endorses SI and chronic generalized "all over" pain. Pt rated pain 9/10 and reports taking Oxycodone 10 mg as prescribed by provider. Pt denies HI and AVH at present. Appearance is well groomed and appropriate for climate/ season. Speech is pressured at times but coherent and easy to follow in conversation. Her affect is anxious yet friendly and cooperative. On observation by this Clinical research associate, pt is experience repetitive involuntary  Movements as evidence by left thumb and pink finger constantly rubbing together. AIMS scale score was an 8. On site provider and unit nurse notified by this Clinical research associate.  Pt was offered and accepted a dinner tray. Pt belongings were identified, documented and secured per company policy. Items able to be kept were given to pt. Pt signed all necessary consents. Skin assessment was performed and pt was found to be free of contraband and skin was  intact. Vital signs were WNL. Pt's perspective is that she lacks support system and identifies stressors as recent break-up with partner of one year and inability to trust people. Pt states she have gotten so down she have recently tried snorting cocaine. She realized she is at a low point in life and sought help for herself.   Pt was oriented to the unit and denied further questions or concerns. Pt was placed on fall precautions as she stated she fall often because of "clumsiness".  No signs of distress nor concerns at this time. Pt able to readily contract for safety. Will continue to monitor and assess Q 15 minutes. Safety maintained

## 2020-11-06 NOTE — [facility] Group Notes (Incomplete)
Adult Psychoeducational Group Not Date:  11/06/2020 Time:  0900-1045 Group Topic/Focus: PROGRESSIVE RELAXATION. A group where deep breathing is taught and tensing and relaxation muscle groups is used. Imagery is used as well.  Pts are asked to imagine 3 pillars that hold them up when they are not able to hold themselves up.  Participation Level:  Active  Participation Quality:  Appropriate  Affect:  Appropriate  Cognitive:  Oriented  Insight: Improving  Engagement in Group:  Engaged  Modes of Intervention:  Activity, Discussion, Education, and Support  Additional Comments:    Judge, Christine A    

## 2020-11-06 NOTE — Plan of Care (Signed)
Nurse discussed anxiety, depression and coping skills with patient.  

## 2020-11-06 NOTE — H&P (Addendum)
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  Patient with long hx of depression, anxiety and multiple suicide attempted was admitted from [facility] for treatment of exacerbation of depression and anxiety with suicidal ideation.  Patient was seen this morning for admission interview.  She calm and cooperative and she stated that she came in Voluntarily due to relationship stressors and her daughter coming out as a transgender child.  She has missed days of work because she was anxious and depressed and did not want to face her co-workers.  Her last hospitalization in a Psychiatric unit was 5 years ago.  Patient reports that she has been Stable for a while until she stopped taking Rexulti.  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  Her Psychiatrist asked her to cut dose down to 1 mg which she did but continued having side effects and her PCP nurse asked her to stop taking Rexulti and come to the ER.  Patient sees Zorita PangJulie MCclure at [facility] in [place].   Patient reports intense depression 10/10 with 10 being severe depression and anxiety.  Psychiatric hx includes Opoid use disorder, Borderline Personality disorder, ADHD on Vyvanse and Adderall.  Last  drug and Alcohol rehabilitation was 5 years ago.   Patient endorsed suicide ideation with any plan available because she will not be given Vyvanse and Adderall in the unit.  Her UDS is positive for Stimulant, Cocaine and Cannabis.  She admits she recently tried Cocaine use.  Patient reports a suicide attempt 5 years ago  by hanging and for 5 years she has not tried to kill herself.  She reports poor appetite and sleep. She is unable to contract for safety at this time and is now on 1:1 observation for safety.  We have resumed home medications and will use Abilify to replace Rexulti.  We will make medication changes as needed.  Discharge plan will include helping patient engage in outpatient therapy.  Patient denies HI/AVH. Associated Signs/Symptoms: Depression Symptoms:  depressed mood, anhedonia, insomnia, psychomotor agitation, fatigue, difficulty concentrating, suicidal thoughts with specific plan, anxiety, disturbed sleep, Duration of Depression Symptoms: Greater than two weeks  (Hypo) Manic Symptoms:  Irritable Mood, Labiality of Mood, Anxiety Symptoms:  Excessive Worry, Psychotic Symptoms:  NA PTSD Symptoms: NA Total Time spent with patient: 50 minutes  Past Psychiatric History: Opoid use disorder, Alcohol use disoRder,  Borderline Personality disorder, ADHD on Vyvanse and Adderall.  Suicide attempts, PTSD  Is the patient at risk to self? Yes.    Has the patient been a risk to self in the past 6 months? Yes.    Has the patient been a risk to self within the distant past? Yes.    Is the patient a risk to others? No.  Has the patient been a risk to others in the past 6 months? No.  Has the patient been a risk to others within the distant past? No.   Prior Inpatient Therapy:   Prior Outpatient Therapy:    Alcohol Screening: Patient refused Alcohol Screening Tool: Yes 1. How often do you have a drink containing alcohol?: Never 2. How many drinks containing  alcohol do you have on a typical day when you are drinking?: 1 or 2 3. How often do you have six or more drinks on one occasion?: Never AUDIT-C Score: 0 Substance Abuse History in the last 12 months:  Yes.   Consequences of Substance Abuse: NA Previous Psychotropic Medications: Yes  Psychological Evaluations: Yes  Past Medical History:   Past Medical History:  Diagnosis Date  . Adult ADHD 05/2020  . Alcohol use disorder severe. 12/18/2014  . Anxiety   . Borderline personality disorder (HCC) 12/18/2014  . CVA (cerebral infarction)   . Depression   . 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 Opioid use disorder, severe, dependence (HCC) 12/20/2014  . PTSD (post-traumatic stress disorder) 03/28/2015  . 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 Seizures (HCC)   . Suicide attempt by hanging [facility])     Past Surgical History:  Procedure Laterality Date  . NO PAST SURGERIES     Family History:  Family History  Problem Relation Age of Onset  . Diabetes Father   . Dementia Mother   . Anxiety disorder Mother    Family Psychiatric  History: Mother-Dementia, Anxiety disorder,  Tobacco Screening: Have you used any form of tobacco in the last 30 days? (Cigarettes, Smokeless Tobacco, Cigars, and/or Pipes): Yes Tobacco use, Select all that apply: smokeless tobacco use daily Are you interested in Tobacco Cessation Medications?: No, patient refused Counseled patient on smoking cessation including recognizing danger situations, developing coping skills and basic information about quitting provided: Yes Social History:  Social History   Substance and Sexual Activity  Alcohol Use Yes   Comment: socially     Social History   Substance and Sexual Activity  Drug Use Yes  . Types: Benzodiazepines, Hydrocodone   Comment: prescribed vivance and adderol    Additional Social History:                           Allergies:   Allergies  Allergen Reactions  . Depakote [Valproic Acid] Anaphylaxis and Other (See Comments)    Reaction:  Seizures   . Haldol [Haloperidol Lactate] Anaphylaxis and Other (See Comments)    Reaction:  Seizures   . Buspar [Buspirone] Other (See Comments)    "lock jaw"  . Meloxicam Other (See Comments) and Swelling     Ankle swelling and reflex  . Keflet [Cephalexin] Rash   Lab Results:  Results for orders placed or performed during the hospital encounter of 11/05/20 (from the past 48 hour(s))  Basic metabolic panel     Status: Abnormal   Collection Time: 11/06/20  6:37 AM  Result Value Ref Range   Sodium 137 135 - 145 mmol/L   Potassium 3.7 3.5 - 5.1 mmol/L   Chloride 101 98 - 111 mmol/L   CO2 25 22 - 32 mmol/L   Glucose, Bld 122 (H) 70 - 99 mg/dL    Comment: Glucose reference range applies only to samples taken after fasting for at least 8 hours.   BUN 9 6 - 20 mg/dL   Creatinine, Ser 5.00 0.44 - 1.00 mg/dL   Calcium 9.3 8.9 - 37.0 mg/dL   GFR, Estimated >48 >88 mL/min    Comment: (NOTE) Calculated using the CKD-EPI Creatinine Equation (2021)    Anion gap 11 5 - 15    Comment: Performed at [facility], [address], [place], [place] [address]  Hemoglobin A1c     Status: None   Collection Time: 11/06/20  6:37 AM  Result Value Ref Range   Hgb A1c MFr Bld 5.6 4.8 - 5.6 %    Comment: (NOTE) Pre diabetes:          5.7%-6.4%  Diabetes:              >6.4%  Glycemic control for   <7.0% adults with diabetes    Mean Plasma Glucose 114.02 mg/dL    Comment: Performed at [facility], [address], [place], [place] [address]  Lipid panel     Status: Abnormal   Collection Time: 11/06/20  6:37 AM  Result Value Ref Range   Cholesterol 173 0 - 200 mg/dL   Triglycerides 604 (H) <150 mg/dL   HDL 39 (L) >54 mg/dL   Total CHOL/HDL Ratio 4.4 RATIO   VLDL 35 0 - 40 mg/dL   LDL Cholesterol 99 0 - 99 mg/dL    Comment:        Total Cholesterol/HDL:CHD Risk Coronary Heart Disease Risk Table                     Men   Women  1/2 Average Risk   3.4   3.3  Average Risk       5.0   4.4  2 X Average Risk   9.6   7.1  3 X Average Risk  23.4   11.0        Use the calculated Patient Ratio above and the CHD Risk Table to determine the patient's CHD Risk.        ATP III  CLASSIFICATION (LDL):  <100     mg/dL   Optimal  098-119  mg/dL   Near or Above                    Optimal  130-159  mg/dL   Borderline  147-829  mg/dL   High  >562     mg/dL   Very High Performed at [facility], [address] [place], [place] [address]   TSH     Status: None   Collection Time: 11/06/20  6:37 AM  Result Value Ref Range   TSH 2.297 0.350 - 4.500 uIU/mL    Comment: Performed by a 3rd Generation assay with a functional sensitivity of <=0.01 uIU/mL. Performed at [facility], [address], [place], [place] [address]     Blood Alcohol level:  Lab Results  Component Value Date   ETH <10 11/04/2020   ETH 160 (H) 12/29/2019    Metabolic Disorder Labs:  Lab Results  Component Value Date   HGBA1C 5.6 11/06/2020   MPG 114.02 11/06/2020   MPG 119.76 04/12/2017   Lab Results  Component Value Date   PROLACTIN 98.1 (H) 12/12/2015   Lab Results  Component Value Date   CHOL 173 11/06/2020   TRIG 175 (H) 11/06/2020   HDL 39 (L) 11/06/2020   CHOLHDL 4.4 11/06/2020   VLDL 35 11/06/2020   LDLCALC 99 11/06/2020   LDLCALC 80 04/12/2017    Current Medications: Current Facility-Administered Medications  Medication Dose Route Frequency Provider Last Rate Last Admin  . acetaminophen (TYLENOL) tablet 650 mg  650 mg Oral Q6H PRN Nira Conn A, NP   650 mg at 11/06/20 0906  . alum & mag hydroxide-simeth (MAALOX/MYLANTA) 200-200-20 MG/5ML suspension 30 mL  30 mL Oral Q4H PRN  Nira Conn A, NP      . ARIPiprazole (ABILIFY) tablet 5 mg  5 mg Oral Daily Antonieta Pert, MD   5 mg at 11/06/20 1115  . feeding supplement (ENSURE ENLIVE / ENSURE PLUS) liquid 237 mL  237 mL Oral Q24H Antonieta Pert, MD      . gabapentin (NEURONTIN) capsule 900 mg  900 mg Oral TID Nira Conn A, NP   900 mg at 11/06/20 1113  . hydrOXYzine (ATARAX/VISTARIL) tablet 25 mg  25 mg Oral TID Nira Conn A, NP   25 mg at 11/06/20 1113  . magnesium  hydroxide (MILK OF MAGNESIA) suspension 30 mL  30 mL Oral Daily PRN Nira Conn A, NP      . mirtazapine (REMERON) tablet 15 mg  15 mg Oral QHS Nira Conn A, NP   15 mg at 11/05/20 2311  . multivitamin with minerals tablet 1 tablet  1 tablet Oral Daily Antonieta Pert, MD      . OXcarbazepine (TRILEPTAL) tablet 75 mg  75 mg Oral BID Antonieta Pert, MD      . traZODone (DESYREL) tablet 50 mg  50 mg Oral QHS PRN Nira Conn A, NP   50 mg at 11/06/20 0047  . venlafaxine XR (EFFEXOR-XR) 24 hr capsule 300 mg  300 mg Oral Q breakfast Nira Conn A, NP   300 mg at 11/06/20 0730  . white petrolatum (VASELINE) gel            PTA Medications: Medications Prior to Admission  Medication Sig Dispense Refill Last Dose  . acetaminophen (TYLENOL) 325 MG tablet Take 325-650 mg by mouth every 6 (six) hours as needed for mild pain or moderate pain.     Marland Kitchen amphetamine-dextroamphetamine (ADDERALL) 20 MG tablet Take 20 mg by mouth daily.     Marland Kitchen buPROPion (WELLBUTRIN SR) 100 MG 12 hr tablet Take 100 mg by mouth daily.     . ciprofloxacin (CIPRO) 500 MG tablet TAKE ONE TABLET BY MOUTH 2 TIMES A DAY FOR 7 DAYS (Patient not taking: No sig reported) 14 tablet 0   . diphenhydrAMINE (BENADRYL ALLERGY) 25 MG tablet Take 1 tablet (25 mg total) by mouth every 6 (six) hours as needed. (Patient not taking: No sig reported) 30 tablet 0   . famotidine (PEPCID) 20 MG tablet Take 1 tablet (20 mg total) by mouth 2 (two) times daily. (Patient not taking: No sig reported) 60 tablet 1   . gabapentin (NEURONTIN) 300 MG capsule Take 900 mg by mouth 3 (three) times daily.     . hydrOXYzine (ATARAX/VISTARIL) 25 MG tablet Take 25 mg by mouth 3 (three) times daily.     Marland Kitchen ibuprofen (ADVIL) 600 MG tablet TAKE ONE TABLET BY MOUTH EVERY 8 HOURS AS NEEDED FOR MODERATE PAIN (Patient not taking: No sig reported) 20 tablet 0   . mirtazapine (REMERON) 15 MG tablet Take 15 mg by mouth at bedtime.     . naloxone (NARCAN) nasal spray 4 mg/0.1  mL Nasal spray in case of heroin overdose again 1 each 1   . ondansetron (ZOFRAN-ODT) 4 MG disintegrating tablet TAKE ONE TABLET BY MOUTH EVERY 8 HOURS AS NEEDED FOR NAUSEA OR VOMITING 12 tablet 0   . pseudoephedrine (SUDAFED) 120 MG 12 hr tablet Take 1 tablet (120 mg total) by mouth 2 (two) times daily as needed for congestion. (Patient not taking: No sig reported) 20 tablet 2   . triamcinolone ointment (KENALOG) 0.5 % Apply  1 application topically 2 (two) times daily. (Patient not taking: No sig reported) 30 g 0   . venlafaxine XR (EFFEXOR-XR) 150 MG 24 hr capsule Take 300 mg by mouth daily with breakfast.      . VYVANSE 50 MG capsule Take 50 mg by mouth every morning.       Musculoskeletal: Strength & Muscle Tone: within normal limits Gait & Station: normal Patient leans: Front            Psychiatric Specialty Exam:  Presentation  General Appearance: Appropriate for Environment  Eye Contact:Good  Speech:Clear and Coherent  Speech Volume:Normal  Handedness:Right   Mood and Affect  Mood:Euthymic  Affect:Appropriate; Congruent   Thought Process  Thought Processes:Coherent  Duration of Psychotic Symptoms: No data recorded Past Diagnosis of Schizophrenia or Psychoactive disorder: No  Descriptions of Associations:Intact  Orientation:Full (Time, Place and Person)  Thought Content:Logical; Tangential  Hallucinations:Hallucinations: None  Ideas of Reference:None  Suicidal Thoughts:Suicidal Thoughts: No  Homicidal Thoughts:Homicidal Thoughts: No   Sensorium  Memory:Immediate Good; Recent Good; Remote Good  Judgment:Fair  Insight:Fair   Executive Functions  Concentration:Good  Attention Span:Good  Recall:Good  Fund of Knowledge:Good  Language:Good   Psychomotor Activity  Psychomotor Activity:Psychomotor Activity: Normal   Assets  Assets:Desire for Improvement; Housing; Resilience; Social Support   Sleep  Sleep:Sleep:  Poor    Physical Exam: Physical Exam Vitals and nursing note reviewed.  HENT:     Head: Normocephalic.  Cardiovascular:     Rate and Rhythm: Normal rate and regular rhythm.     Pulses: Normal pulses.  Pulmonary:     Effort: Pulmonary effort is normal.  Musculoskeletal:     Cervical back: Normal range of motion.  Neurological:     Mental Status: She is alert.    Review of Systems  Eyes: Negative.   Respiratory: Negative.   Cardiovascular: Negative.   Gastrointestinal: Negative.   Genitourinary: Negative.   Musculoskeletal: Negative.   Neurological: Negative.   Endo/Heme/Allergies: Negative.   Psychiatric/Behavioral: Positive for depression and suicidal ideas. The patient is nervous/anxious and has insomnia.    Blood pressure 119/85, pulse (!) 106, temperature 98.2 F (36.8 C), temperature source Oral, resp. rate 18, height  (1.727 m), weight 94.3 kg, SpO2 99 %. Body mass index is 31.63 kg/m.  Treatment Plan Summary: Daily contact with patient to assess and evaluate symptoms and progress in treatment and Medication management  Plan: - Start Oxcarbazepine 75 mg po bid-Mood stabilization -Mirtazapine 15 mg po at bed time for sleep.Depression -Venlafaxine XR, 24 HRS 300 MG PO Daily for Depression -Sleep- Trazodone 50 mg po as need QHS for sleep -Anxiety-Hydroxyzine 25 mg po tid as needed for anxiety -Mood Control-Aripiprazole 5 mg po daily -Pain/Mood- Gabapentin 900 mg po tid -Continue 1:1 Observation until stable and able to contract for safety   Observation Level/Precautions:  1 to 1  Laboratory:  CBC Chemistry Profile HbAIC UDS Results reviewed, mostly WNL except for UDS posite for Canabis, Amphetamines and Cocaine.Elevated Triglycerides.  Psychotherapy:    Medications:    Consultations:    Discharge Concerns:    Estimated LOS:  Other:     Physician Treatment Plan for Primary Diagnosis: <principal problem not specified> Long Term Goal(s): Improvement in  symptoms so as ready for discharge  Short Term Goals: Ability to identify changes in lifestyle to reduce recurrence of condition will improve, Ability to verbalize feelings will improve, Ability to disclose and discuss suicidal ideas, Ability to demonstrate self-control will improve, Ability  to identify and develop effective coping behaviors will improve, Ability to maintain clinical measurements within normal limits will improve, Compliance with prescribed medications will improve and Ability to identify triggers associated with substance abuse/mental health issues will improve  Physician Treatment Plan for Secondary Diagnosis: Active Problems:   Severe recurrent major depression (HCC)  Long Term Goal(s): Improvement in symptoms so as ready for discharge  Short Term Goals: Ability to identify changes in lifestyle to reduce recurrence of condition will improve, Ability to verbalize feelings will improve, Ability to disclose and discuss suicidal ideas, Ability to demonstrate self-control will improve, Ability to identify and develop effective coping behaviors will improve, Ability to maintain clinical measurements within normal limits will improve, Compliance with prescribed medications will improve and Ability to identify triggers associated with substance abuse/mental health issues will improve  I certify that inpatient services furnished can reasonably be expected to improve the patient's condition.    Earney Navy, NP-PMHNP-BC 4/10/20221:09 PM

## 2020-11-06 NOTE — Progress Notes (Signed)
Patient stated she takes vyvance 50 mg daily and adderall 20 mg daily at home.  Also takes Ativan 1 mg every 8 hrs.  Has been struggling with high anxiety, "chest cave in", for the past 2 months.  Stated vistaril does not work for her.   Patient denied SI and HI, contracts for safety.  Denied A/V hallucinations.  Denied pain.  Safety maintained with 15 minute checks. Patient stated she will stay calm until she talks to MD/NP about medications.
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
## 2020-11-06 NOTE — BHH Suicide Risk Assessment (Signed)
[facility] Admission Suicide Risk Assessment   Nursing information obtained from:  Patient Demographic factors:  Gay, lesbian, or bisexual orientation,Living alone Current Mental Status:  Suicidal ideation indicated by patient Loss Factors:  Loss of significant relationship Historical Factors:  NA Risk Reduction Factors:  NA  Total Time spent with patient: 30 minutes Principal Problem: <principal problem not specified> Diagnosis:  Active Problems:   Severe recurrent major depression (HCC)  Subjective Data: Patient is seen and examined.  Patient is a 45 year old female with a past psychiatric history significant for depression, anxiety, alcohol use disorder, previous suicide attempts, cocaine use disorder and reported attention deficit disorder who presented to the [facility] emergency department on 11/04/2020 with suicidal ideation.  The patient told the examining physicians there that she had been coming off of Rexulti, had had involuntary jaw spasms, had not been doing well over the last 2 weeks and was not eating or sleeping.  Patient was evaluated by the nurse practitioner consulting for psychiatry.  The patient had been placed under involuntary commitment.  She reported anxiety and other psychiatric issues leading to 1 to 2 hours of sleep at night and not eating.  She stated she was taking her psychiatric medications except the Rexulti.  He was noted that the patient had a history of malingering, but it was not felt to be playing a role here.  During my examination with the patient today she is significantly anxious, significantly psychomotor agitated, and basically unable to sit still.  In the electronic medical record it reported a hospitalized at [facility] on the psychiatric unit on 12/29/2019.  The notes in the chart per Dr. Toni Amend was that the patient had not been admitted to the inpatient service.  Her medications that she was on at that point included Rexulti,  gabapentin, hydroxyzine, mirtazapine, naloxone, Protonix, prazosin, sulcal fate and venlafaxine.  The patient had apparently left a call to [facility] primary care [facility] on 11/04/2020.  She reported that she had been anxious for the last week.  She stated that she had stopped most of her psychiatric medicines.  She noted significant changes in her life over the last month.  She stated she had broken up with her boyfriend, her mother had stage III dementia, and her daughter told her that she was transgender.  The recommendations for her were to go to the [facility] urgent care center in [place] or have a neighbor take her to the [facility] for evaluation.  She went to [facility]  Because they had no psychiatric beds available the decision was made to transfer to our facility for evaluation and stabilization.  Continued Clinical Symptoms:    The "Alcohol Use Disorders Identification Test", Guidelines for Use in Primary Care, Second Edition.  World Science writer [facility]). Score between 0-7:  no or low risk or alcohol related problems. Score between 8-15:  moderate risk of alcohol related problems. Score between 16-19:  high risk of alcohol related problems. Score 20 or above:  warrants further diagnostic evaluation for alcohol dependence and treatment.   CLINICAL FACTORS:   Bipolar Disorder:   Mixed State Depression:   Anhedonia Comorbid alcohol abuse/dependence Hopelessness Impulsivity Insomnia Alcohol/Substance Abuse/Dependencies   Musculoskeletal: Strength & Muscle Tone: within normal limits Gait & Station: normal Patient leans: N/A  Psychiatric Specialty Exam:  Presentation  General Appearance: Appropriate for Environment  Eye Contact:Good  Speech:Clear and Coherent  Speech Volume:Normal  Handedness:Right   Mood and Affect  Mood:Euthymic  Affect:Appropriate; Congruent   Thought Process  Thought Processes:Coherent  Descriptions of  Associations:Intact  Orientation:Full (Time, Place and Person)  Thought Content:Logical; Tangential  History of Schizophrenia/Schizoaffective disorder:No  Duration of Psychotic Symptoms:No data recorded Hallucinations:Hallucinations: None  Ideas of Reference:None  Suicidal Thoughts:Suicidal Thoughts: No  Homicidal Thoughts:Homicidal Thoughts: No   Sensorium  Memory:Immediate Good; Recent Good; Remote Good  Judgment:Fair  Insight:Fair   Executive Functions  Concentration:Good  Attention Span:Good  Recall:Good  Fund of Knowledge:Good  Language:Good   Psychomotor Activity  Psychomotor Activity:Psychomotor Activity: Normal   Assets  Assets:Desire for Improvement; Housing; Resilience; Social Support   Sleep  Sleep:Sleep: Poor    Physical Exam: Physical Exam Vitals and nursing note reviewed.  HENT:     Head: Normocephalic and atraumatic.  Pulmonary:     Effort: Pulmonary effort is normal.  Neurological:     General: No focal deficit present.     Mental Status: She is alert and oriented to person, place, and time.    ROS Blood pressure 119/85, pulse (!) 106, temperature 98.2 F (36.8 C), temperature source Oral, resp. rate 18, height 5\' 8"  (1.727 m), weight 94.3 kg, SpO2 99 %. Body mass index is 31.63 kg/m.   COGNITIVE FEATURES THAT CONTRIBUTE TO RISK:  Thought constriction (tunnel vision)    SUICIDE RISK:   Mild:  Suicidal ideation of limited frequency, intensity, duration, and specificity.  There are no identifiable plans, no associated intent, mild dysphoria and related symptoms, good self-control (both objective and subjective assessment), few other risk factors, and identifiable protective factors, including available and accessible social support.  PLAN OF CARE: Patient is seen and examined.  Patient is a 45 year old female with the above-stated past psychiatric history who was admitted to our to facility secondary to suicidal ideation.  She  will be admitted to the hospital.  She will be integrated in the milieu.  She will be encouraged to attend groups.  The patient seems to be focused on Vyvanse as well as Adderall.  She has had this prescribed as an outpatient.  Unfortunately her drug screen was positive for the amphetamines most probably from those prescribed medications, but was also positive for cocaine and marijuana.  We have explained to the patient that given her physical agitation and anxiety at this point that restarting those medications would be contraindicated.  I am also concerned second to the substances in her system.  Review of the PMP database revealed that she was prescribed Vyvanse 50 mg #30 on 10/27/2020.  She had also been prescribed Adderall 20 mg 30 tablets on 10/12/2020.  She had also apparently been prescribed oxycodone on 09/23/2020.  We discussed her medication options, and I explained to her that Abilify was similar in structure to Rexulti, and that we would start that back at a low dose.  We started at 5 mg p.o. daily, and we will watch for any dystonic reactions that occurs with this.  We discussed the possibility of adding a mood stabilizer such as Tegretol or Depakote.  She stated that she was "allergic to" Depakote, but she did agree to a low-dose trial of Trileptal at 75 mg p.o. twice daily and to titrate during the course of the hospitalization.  We will also restart her Neurontin 900 mg p.o. 3 times daily, and the mirtazapine.  She will also have available hydroxyzine as well as trazodone.  Review of the rest of her laboratories revealed normal electrolytes with a creatinine of 0.65 and liver function enzymes  that were normal.  Lipid panel was normal except for an elevated triglyceride at 175.  Her white blood cell count was elevated at 15.8.  The rest of her CBC was normal.  We will repeat that given the elevated white blood cell count.  Acetaminophen was less than 10, salicylate was 14.3.  Glucose was 122, hemoglobin  A1c was 5.6.  TSH was 2.297.  Respiratory panel was negative for influenza A, B and coronavirus.  Blood alcohol was less than 10.  Drug screen was positive for amphetamines, cocaine, cannabinoids and tricyclic antidepressants.  EKG is not been obtained, but we will order that.  Her blood pressure and heart rate are mildly elevated.  Blood pressure was 148/101, pulse was 107-113.  She is afebrile.  Pulse oximetry on room air was 96%.  I certify that inpatient services furnished can reasonably be expected to improve the patient's condition.   Antonieta Pert, MD 11/06/2020, 1:14 PM

## 2020-11-06 NOTE — BHH Group Notes (Signed)
Psychoeducational Group Note  Date: 11-06-20 Time:  1300  Group Topic/Focus:  Making Healthy Choices:   The focus of this group is to help patients identify negative/unhealthy choices they were using prior to admission and identify positive/healthier coping strategies to replace them upon discharge.In this group, patients started asking about the brain and how the brain works with and how the chemicals work for those who use substances, the pros and cons of saboxone.  Participation Level:  Active  Participation Quality:  Appropriate  Affect:  Appropriate  Cognitive:  Oriented  Insight:  Improving  Engagement in Group:  Engaged  Additional Comments:Pt rates her energy at a 10/10.   Dione Housekeeper

## 2020-11-06 NOTE — Progress Notes (Signed)
NUTRITION ASSESSMENT RD working remotely.   Pt identified as at risk on the Malnutrition Screen Tool  INTERVENTION: - will order Ensure Enlive once/day, each supplement provides 350 kcal and 20 grams of protein. - will order 1 tablet multivitamin with minerals/day.  [facility] staff to continue to encourage PO intakes of meals, supplement, and snacks.   NUTRITION DIAGNOSIS: Unintentional weight loss related to sub-optimal intake as evidenced by pt report.   Goal: Pt to meet >/= 90% of their estimated nutrition needs.  Monitor:  PO intake  Assessment:  She presented to the ED d/t anxiety. She reported her job, her mother's health, and her daughter "coming out" recently as stressors.   She reported decreased sleep and a poor appetite d/t these stressors.  Weight yesterday was 208 lb and weight on 09/22/20 was 219 lb. This indicates 11 lb weight loss (5% body weight) in the past 1.5 months; not significant for time frame.    45 y.o. female  Height: Ht Readings from Last 1 Encounters:  11/05/20 5\' 8"  (1.727 m)    Weight: Wt Readings from Last 1 Encounters:  11/05/20 94.3 kg    Weight Hx: Wt Readings from Last 10 Encounters:  11/05/20 94.3 kg  11/04/20 99.8 kg  09/23/20 99.8 kg  09/22/20 99.8 kg  06/01/20 99 kg  05/25/20 99.8 kg  05/03/20 99.8 kg  03/09/20 99.8 kg  02/27/20 (!) 99.8 kg  02/22/20 (!) 99.8 kg    BMI:  Body mass index is 31.63 kg/m. Pt meets criteria for obesity based on current BMI.  Estimated Nutritional Needs: Kcal: 25-30 kcal/kg Protein: > 1 gram protein/kg Fluid: 1 ml/kcal  Diet Order:  Diet Order            Diet regular Room service appropriate? Yes; Fluid consistency: Thin  Diet effective now                Pt is also offered choice of unit snacks mid-morning and mid-afternoon.  Pt is eating as desired.   Lab results and medications reviewed.      02/24/20, MS, RD, LDN, CNSC Inpatient Clinical Dietitian RD pager #  available in [facility]  After hours/weekend pager # available in [facility]
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
## 2020-11-06 NOTE — Plan of Care (Addendum)
D:  Patient's self inventory sheet, patient sleeps good, sleep medication helpful.  Fair appetite, high energy level, poor concentration.  Rated depression, anxiety and hopeless 10+.  Denied withdrawals.  Denied SI, contracts for safety.  Physical problems, pain, dizziness, headaches.  Pain rated 9.  Goal is work on Data processing manager and anxiety.  Has a lot of physical pain and high anxiety.,  Continues to work on patience and anxiety.  No discharge plans yet. A:  Medications administered per MD orders.  Emotional support and encouragement given patient. R:  Denied SI and HI, contracts for safety.  Denied A/V Hallucinations.  Safety maintained with 15 minute checks.

## 2020-11-06 NOTE — BHH Suicide Risk Assessment (Signed)
BHH INPATIENT:  Family/Significant Other Suicide Prevention Education  Suicide Prevention Education:  Patient Refusal for Family/Significant Other Suicide Prevention Education: The patient Sandy Mason has refused to provide written consent for family/significant other to be provided Family/Significant Other Suicide Prevention Education during admission and/or prior to discharge.  Physician notified.  Carloyn Jaeger Grossman-Orr 11/06/2020, 2:07 PM

## 2020-11-06 NOTE — Progress Notes (Signed)
   11/06/20 2320  COVID-19 Daily Checkoff  Have you had a fever (temp > 37.80C/100F)  in the past 24 hours?  No  If you have had runny nose, nasal congestion, sneezing in the past 24 hours, has it worsened? No  COVID-19 EXPOSURE  Have you traveled outside the state in the past 14 days? No  Have you been in contact with someone with a confirmed diagnosis of COVID-19 or PUI in the past 14 days without wearing appropriate PPE? No  Have you been living in the same home as a person with confirmed diagnosis of COVID-19 or a PUI (household contact)? No  Have you been diagnosed with COVID-19? No

## 2020-11-07 MED ORDER — LORAZEPAM 0.5 MG PO TABS
0.5000 mg | ORAL_TABLET | Freq: Once | ORAL | Status: AC
  Administered 2020-11-07: 0.5 mg via ORAL

## 2020-11-07 MED ORDER — LORAZEPAM 0.5 MG PO TABS
ORAL_TABLET | ORAL | Status: AC
  Filled 2020-11-07: qty 1

## 2020-11-07 MED ORDER — ARIPIPRAZOLE 5 MG PO TABS
5.0000 mg | ORAL_TABLET | Freq: Every day | ORAL | Status: DC
  Administered 2020-11-08: 5 mg via ORAL
  Filled 2020-11-07 (×3): qty 1

## 2020-11-07 NOTE — Progress Notes (Addendum)
[facility] MD Progress Note  11/07/2020 5:00 PM Sandy Mason  MRN:  [id number]   Subjective: Patient states that she has been anxious and has had problems concentrating in groups.  She requests to restart Vyvanse or Adderall and requests as needed lorazepam.  Objective: Record reviewed.  Patient's case was discussed in detail with members of the treatment team.  Patient was seen and interviewed in the office on the unit this afternoon.  The patient reports anxious irritable mood.  She reports that at times she becomes so anxious that she has palpitations.  She reports passive wishes not to be alive and has nonspecific thoughts of hurting herself but denies intent or plan.  The patient reports good appetite and states she slept well last night.  She did not take her standing dose Abilify this morning because she felt it made her too tired yesterday.  She is agreeable to taking it in the evening.  The patient perseverates on having her stimulants restarted and on taking some medication other than hydroxyzine for anxiety.  During our conversation she maintains appropriate eye contact.  There are no motor abnormalities.  Speech is of normal rate and volume.  Mood is anxious and irritable.  Affect is congruent.  Thought processes are coherent and goal-directed.  The patient reports fleeting thoughts of hurting herself but denies intent or plan to harm self.  There is some perseveration on obtaining select medications.  No paranoid or delusional content is elicited.  The patient denies perceptual abnormalities and does not appear to respond to internal stimuli.  She is alert and oriented.  Attention and concentration are grossly intact.  Insight and judgment are fair.  I discussed with patient shifting her Abilify dose to 5 mg in the evening to reduce the chance of it making her feel tired during the day.  Patient is receptive to taking Abilify at night.  She requested that her stimulants be restarted.  I explained  to her the concern that restarting her stimulants may make her mood symptoms, anxiety symptoms and experience of palpitations worse.  She verbalized understanding.  We discussed that she has benefited from group therapy in the past.  We will negate whether partial hospital program or other outpatient group is an option for patient to help increase her social support outside the hospital.  Per chart review and nursing report, the patient has been somatically preoccupied.  She is not engaged in any self-injurious behavior or aggressive behavior on the unit.  She has been attending select groups.  This morning the patient refused her standing dose Abilify and her standing dose hydroxyzine.  She took her other standing dose medications.  Principal Problem: Severe recurrent major depression (HCC) Diagnosis: Principal Problem:   Severe recurrent major depression (HCC)  Total Time spent with patient: 20 minutes  Past Psychiatric History: See H&P  Past Medical History:  Past Medical History:  Diagnosis Date  . Adult ADHD 05/2020  . Alcohol use disorder severe. 12/18/2014  . Anxiety   . Borderline personality disorder (HCC) 12/18/2014  . CVA (cerebral infarction)   . Depression   . 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 Opioid use disorder, severe, dependence (HCC) 12/20/2014  . PTSD (post-traumatic stress disorder) 03/28/2015  . 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 Seizures (HCC)   . Suicide attempt by hanging [facility])     Past Surgical History:  Procedure Laterality Date  . NO PAST  SURGERIES     Family History:  Family History  Problem Relation Age of Onset  . Diabetes Father   . Dementia Mother   . Anxiety disorder Mother    Family Psychiatric  History: See H&P Social History:  Social History   Substance and Sexual Activity  Alcohol Use Yes   Comment: socially     Social History    Substance and Sexual Activity  Drug Use Yes  . Types: Benzodiazepines, Hydrocodone   Comment: prescribed vivance and adderol    Social History   Socioeconomic History  . Marital status: Single    Spouse name: Not on file  . Number of children: Not on file  . Years of education: Not on file  . Highest education level: Not on file  Occupational History  . Not on file  Tobacco Use  . Smoking status: Current Every Day Smoker    Packs/day: 0.00    Years: 20.00    Pack years: 0.00    Types: Cigarettes  . Smokeless tobacco: Never Used  . Tobacco comment: 1-2 cigarettes per day  Vaping Use  . Vaping Use: Never used  Substance and Sexual Activity  . Alcohol use: Yes    Comment: socially  . Drug use: Yes    Types: Benzodiazepines, Hydrocodone    Comment: prescribed vivance and adderol  . Sexual activity: Yes    Birth control/protection: I.U.D.  Other Topics Concern  . Not on file  Social History Narrative  . Not on file   Social Determinants of Health   Financial Resource Strain: Not on file  Food Insecurity: Not on file  Transportation Needs: Not on file  Physical Activity: Not on file  Stress: Not on file  Social Connections: Not on file   Additional Social History:                         Sleep: Good  Appetite:  Good  Current Medications: Current Facility-Administered Medications  Medication Dose Route Frequency Provider Last Rate Last Admin  . LORazepam (ATIVAN) 0.5 MG tablet           . acetaminophen (TYLENOL) tablet 650 mg  650 mg Oral Q6H PRN Nira Conn A, NP   650 mg at 11/07/20 1627  . alum & mag hydroxide-simeth (MAALOX/MYLANTA) 200-200-20 MG/5ML suspension 30 mL  30 mL Oral Q4H PRN Jackelyn Poling, NP      . Melene Muller ON 11/08/2020] ARIPiprazole (ABILIFY) tablet 5 mg  5 mg Oral QHS Claudie Revering, MD      . feeding supplement (ENSURE ENLIVE / ENSURE PLUS) liquid 237 mL  237 mL Oral Q24H Antonieta Pert, MD      . gabapentin (NEURONTIN)  capsule 900 mg  900 mg Oral TID Nira Conn A, NP   900 mg at 11/07/20 1200  . hydrOXYzine (ATARAX/VISTARIL) tablet 25 mg  25 mg Oral TID Nira Conn A, NP   25 mg at 11/06/20 1113  . magnesium hydroxide (MILK OF MAGNESIA) suspension 30 mL  30 mL Oral Daily PRN Nira Conn A, NP      . mirtazapine (REMERON) tablet 15 mg  15 mg Oral QHS Nira Conn A, NP   15 mg at 11/06/20 2151  . multivitamin with minerals tablet 1 tablet  1 tablet Oral Daily Antonieta Pert, MD   1 tablet at 11/07/20 0823  . nicotine (NICODERM CQ - dosed in mg/24 hours) patch 21 mg  21 mg Transdermal Daily Antonieta Pertlary, Greg Lawson, MD   21 mg at 11/07/20 [id number]  . ondansetron (ZOFRAN-ODT) disintegrating tablet 8 mg  8 mg Oral Q8H PRN Antonieta Pertlary, Greg Lawson, MD   8 mg at 11/07/20 1200  . OXcarbazepine (TRILEPTAL) tablet 75 mg  75 mg Oral BID Antonieta Pertlary, Greg Lawson, MD   75 mg at 11/07/20 [id number]  . traZODone (DESYREL) tablet 50 mg  50 mg Oral QHS PRN,MR X 1 Jaclyn Shaggyaylor, Cody W, PA-C   50 mg at 11/06/20 2151  . venlafaxine XR (EFFEXOR-XR) 24 hr capsule 300 mg  300 mg Oral Q breakfast Nira ConnBerry, Jason A, NP   300 mg at 11/07/20 0820    Lab Results:  Results for orders placed or performed during the hospital encounter of 11/05/20 (from the past 48 hour(s))  Basic metabolic panel     Status: Abnormal   Collection Time: 11/06/20  6:37 AM  Result Value Ref Range   Sodium 137 135 - 145 mmol/L   Potassium 3.7 3.5 - 5.1 mmol/L   Chloride 101 98 - 111 mmol/L   CO2 25 22 - 32 mmol/L   Glucose, Bld 122 (H) 70 - 99 mg/dL    Comment: Glucose reference range applies only to samples taken after fasting for at least 8 hours.   BUN 9 6 - 20 mg/dL   Creatinine, Ser 8.460.65 0.44 - 1.00 mg/dL   Calcium 9.3 8.9 - 96.210.3 mg/dL   GFR, Estimated >95>60 >28>60 mL/min    Comment: (NOTE) Calculated using the CKD-EPI Creatinine Equation (2021)    Anion gap 11 5 - 15    Comment: Performed at [facility], [address] [place], [place] [address]  Hemoglobin  A1c     Status: None   Collection Time: 11/06/20  6:37 AM  Result Value Ref Range   Hgb A1c MFr Bld 5.6 4.8 - 5.6 %    Comment: (NOTE) Pre diabetes:          5.7%-6.4%  Diabetes:              >6.4%  Glycemic control for   <7.0% adults with diabetes    Mean Plasma Glucose 114.02 mg/dL    Comment: Performed at [facility], [address] [place], [place] [address]  Lipid panel     Status: Abnormal   Collection Time: 11/06/20  6:37 AM  Result Value Ref Range   Cholesterol 173 0 - 200 mg/dL   Triglycerides 725175 (H) <150 mg/dL   HDL 39 (L) >36>40 mg/dL   Total CHOL/HDL Ratio 4.4 RATIO   VLDL 35 0 - 40 mg/dL   LDL Cholesterol 99 0 - 99 mg/dL    Comment:        Total Cholesterol/HDL:CHD Risk Coronary Heart Disease Risk Table                     Men   Women  1/2 Average Risk   3.4   3.3  Average Risk       5.0   4.4  2 X Average Risk   9.6   7.1  3 X Average Risk  23.4   11.0        Use the calculated Patient Ratio above and the CHD Risk Table to determine the patient's CHD Risk.        ATP III CLASSIFICATION (LDL):  <100     mg/dL   Optimal  [id number]  mg/dL  Near or Above                    Optimal  130-159  mg/dL   Borderline  097-353  mg/dL   High  >299     mg/dL   Very High Performed at [facility], [address] [place], [place] [address]   TSH     Status: None   Collection Time: 11/06/20  6:37 AM  Result Value Ref Range   TSH 2.297 0.350 - 4.500 uIU/mL    Comment: Performed by a 3rd Generation assay with a functional sensitivity of <=0.01 uIU/mL. Performed at [facility], [address], [place], [place] [address]     Blood Alcohol level:  Lab Results  Component Value Date   ETH <10 11/04/2020   ETH 160 (H) 12/29/2019    Metabolic Disorder Labs: Lab Results  Component Value Date   HGBA1C 5.6 11/06/2020   MPG 114.02 11/06/2020   MPG 119.76 04/12/2017   Lab Results  Component Value Date   PROLACTIN 98.1  (H) 12/12/2015   Lab Results  Component Value Date   CHOL 173 11/06/2020   TRIG 175 (H) 11/06/2020   HDL 39 (L) 11/06/2020   CHOLHDL 4.4 11/06/2020   VLDL 35 11/06/2020   LDLCALC 99 11/06/2020   LDLCALC 80 04/12/2017    Physical Findings: AIMS: Facial and Oral Movements Muscles of Facial Expression: None, normal Lips and Perioral Area: None, normal Jaw: None, normal Tongue: None, normal,Extremity Movements Upper (arms, wrists, hands, fingers): Moderate Lower (legs, knees, ankles, toes): Minimal, Trunk Movements Neck, shoulders, hips: None, normal, Overall Severity Severity of abnormal movements (highest score from questions above): Severe Incapacitation due to abnormal movements: None, normal Patient's awareness of abnormal movements (rate only patient's report): No Awareness, Dental Status Current problems with teeth and/or dentures?: No Does patient usually wear dentures?: No  CIWA:  CIWA-Ar Total: 7 COWS:     Musculoskeletal: Strength & Muscle Tone: within normal limits Gait & Station: normal Patient leans: N/A  Psychiatric Specialty Exam:  Presentation  General Appearance: Appropriate for Environment; Casual  Eye Contact:Good  Speech:Clear and Coherent  Speech Volume:Normal  Handedness:Right   Mood and Affect  Mood:Anxious; Dysphoric; Irritable  Affect:Congruent   Thought Process  Thought Processes:Coherent  Descriptions of Associations:Intact  Orientation:Full (Time, Place and Person)  Thought Content:Logical  History of Schizophrenia/Schizoaffective disorder:No  Duration of Psychotic Symptoms:No data recorded Hallucinations:Hallucinations: None  Ideas of Reference:None  Suicidal Thoughts:Suicidal Thoughts: Yes, Passive SI Passive Intent and/or Plan: Without Intent; Without Plan  Homicidal Thoughts:Homicidal Thoughts: No   Sensorium  Memory:Immediate Good; Recent Good; Remote Good  Judgment:Fair  Insight:Fair   Executive  Functions  Concentration:Good  Attention Span:Good  Recall:Good  Fund of Knowledge:Good  Language:Good   Psychomotor Activity  Psychomotor Activity:Psychomotor Activity: Normal   Assets  Assets:Desire for Improvement; Housing; Resilience; Social Support; Vocational/Educational   Sleep  Sleep:Sleep: Good    Physical Exam: Physical Exam Vitals and nursing note reviewed.  HENT:     Head: Normocephalic and atraumatic.  Pulmonary:     Effort: Pulmonary effort is normal.  Neurological:     General: No focal deficit present.     Mental Status: She is alert and oriented to person, place, and time.    ROS Blood pressure 137/81, pulse 87, temperature (!) 97.5 F (36.4 C), temperature source Oral, resp. rate 18, height 5\' 8"  (1.727 m), weight 94.3 kg, SpO2 99 %. Body mass index  is 31.63 kg/m.   Treatment Plan Summary: The patient is a 45 year old female with a past psychiatric history significant for depression, anxiety, alcohol use disorder, previous suicide attempts, cocaine use disorder and reported attention deficit disorder who presented to the [facility] emergency department on 11/04/2020 with suicidal ideation in the context of poor adherence to outpatient psychiatric medications and increased psychosocial stressors.  Inpatient psychiatric hospitalization is needed for further treatment and stabilization of mood and anxiety symptoms and for safety of patient.  Daily contact with patient to assess and evaluate symptoms and progress in treatment and Medication management   Continue every 15-minute observation status for safety of patient.  Encouraged participation in group therapy and therapeutic milieu  Depression/mood stabilization -Continue Effexor XR 300 mg daily for depression and anxiety -Continue mirtazapine 15 mg nightly for depression -Continue Trileptal 75 mg twice daily with plan to increase as tolerated for mood  stabilization. -Continue Abilify and shift to 5 mg nightly for mood stabilization and adjunct of treatment of depression  Anxiety -Continue Effexor XR 300 mg daily for anxiety and depression -Hydroxyzine 25 mg 3 times daily as needed anxiety -Give one-time dose of lorazepam 0.5 mg this afternoon for anxiety  Insomnia -Continue trazodone 50 mg nightly as needed insomnia  Labs obtained on 410 include Chem-7 which was WNL except for glucose of 122.  Lipid panel revealed HDL of 39 and triglycerides of 175 but was otherwise WNL hemoglobin A1c was 5.6.  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  Patient would benefit from referral to partial hospital program or some group therapy treatment setting if available in addition to individual therapy and outpatient psychiatry follow-up.  I certify that inpatient services furnished can reasonably be expected to improve the patient's condition.  Claudie Revering, MD 11/07/2020, 5:00 PM

## 2020-11-07 NOTE — Progress Notes (Signed)
   11/07/20 1632  Vital Signs  Temp (!) 97.5 F (36.4 C)  Temp Source Oral  Pulse Rate 87  Pulse Rate Source Dinamap  Resp 18  BP 137/81  BP Location Left Arm  BP Method Automatic  Patient Position (if appropriate) Sitting  Oxygen Therapy  SpO2 99 %   D: Patient denies SI/HI/AVH. Patient rated both anxiety and depression 10/10. Pt complained that she wasn't getting her ADHD medication, Vyvanse and Ativan. Pt. Stated that she wanted another doctor. Pt. Was out in open areas and was social with peers. Pt. Was labile with staff. Pt complained of nausea. Pt. Complained of HA pain 10/10. Pt complained of chest pain aroun @ 1630, above are the vital signs. Dr. Was notified an ECG was ordered.  A:  Patient took scheduled medicine,but did refuse to take vistaril.  Pt. Had 8 mg of ODT  Zofran that relieved her nausea. Pt. Had 0.5 mg of Ativan for anxiety.  Support and encouragement provided Routine safety checks conducted every 15 minutes. Patient  Informed to notify staff with any concerns.   R: Safety maintained.

## 2020-11-07 NOTE — [facility] Group Notes (Signed)
Occupational Therapy Group Note Date: 11/07/2020 Group Topic/Focus: Stress Management  Group Description: Group encouraged increased participation and engagement through discussion focused on topic of stress management. Patients engaged interactively to discuss components of stress including physical signs, emotional signs, negative management strategies, and positive management strategies. Each individual identified one new stress management strategy they would like to try moving forward.    Therapeutic Goals: Identify current stressors Identify healthy vs unhealthy stress management strategies/techniques Discuss and identify physical and emotional signs of stress Participation Level: Active   Participation Quality: Independent   Behavior: Cooperative and Interactive   Speech/Thought Process: Focused   Affect/Mood: Full range   Insight: Fair   Judgement: Fair   Individualization: Dorise was active in their participation of group discussion/activity. Pt identified "not feeling like myself, I have no control over everything and I never act like this" as something that is stressful here in the hospital, and "I drove myself into stressful delusions and this mood" as something that is stressful outside the hospital. Pt offered support to peers and other groups members, also appeared receptive to education and strategies provided by clinician to manage stress in the future.   Modes of Intervention: Discussion, Education and Support  Patient Response to Interventions:  Attentive, Engaged, Receptive and Interested   Plan: Continue to engage patient in OT groups 2 - 3x/week.  11/07/2020  Donne Hazel, MOT, OTR/L

## 2020-11-07 NOTE — [facility] Group Notes (Signed)
LCSW Group Therapy Note  Type of Therapy/Topic: Group Therapy: Six Dimensions of Wellness  Participation Level: Active  Description of Group: This group will address the concept of wellness and the six concepts of wellness: occupational, physical, social, intellectual, spiritual, and emotional. Patients will be encouraged to process areas in their lives that are out of balance and identify reasons for remaining unbalanced. Patients will be encouraged to explore ways to practice healthy habits daily to attain better physical and mental health outcomes.  Therapeutic Goals: 1. Identify aspects of wellness that they are doing well. 2. Identify aspects of wellness that they would like to improve upon. 3. Identify one action they can take to improve an aspect of wellness in their lives.   Summary of Patient Progress: Pt was seen interacting with her peers appropriately.    Therapeutic Modalities: Cognitive Behavioral Therapy Solution-Focused Therapy Relapse Prevention  Fredirick Lathe, LCSWA Clinicial Social Worker [employer]

## 2020-11-07 NOTE — Progress Notes (Signed)
Recreation Therapy Notes  Date: 4.11.22 Time: 0930 Location: [address] Dayroom  Group Topic: Stress Management   Goal Area(s) Addresses:  Patient will actively participate in stress management techniques presented during session.  Patient will successfully identify benefit of practicing stress management post d/c.   Intervention: Guided exercise with ambient sound and script  Activity :Guided Imagery  LRT read a script that focused envisioning being in a meadow in the summer and enjoying the sights, sounds and feeling of being outdoors.  Patients were to listen and follow along as script was read to fully engage in activity.   Education:  Stress Management, Discharge Planning.   Education Outcome: Acknowledges education  Clinical Observations/Feedback: Patient did not attend group session.   Caroll Rancher, LRT/CTRS         Caroll Rancher A 11/07/2020 10:58 AM

## 2020-11-07 NOTE — Tx Team (Signed)
Interdisciplinary Treatment and Diagnostic Plan Update  11/07/2020 Time of Session: 10:40am Sandy Mason MRN: [id number]  Principal Diagnosis: <principal problem not specified>  Secondary Diagnoses: Active Problems:   Severe recurrent major depression (HCC)   Current Medications:  Current Facility-Administered Medications  Medication Dose Route Frequency Provider Last Rate Last Admin  . acetaminophen (TYLENOL) tablet 650 mg  650 mg Oral Q6H PRN Nira Conn A, NP   650 mg at 11/07/20 3664  . alum & mag hydroxide-simeth (MAALOX/MYLANTA) 200-200-20 MG/5ML suspension 30 mL  30 mL Oral Q4H PRN Nira Conn A, NP      . ARIPiprazole (ABILIFY) tablet 5 mg  5 mg Oral Daily Antonieta Pert, MD   5 mg at 11/06/20 1115  . feeding supplement (ENSURE ENLIVE / ENSURE PLUS) liquid 237 mL  237 mL Oral Q24H Antonieta Pert, MD      . gabapentin (NEURONTIN) capsule 900 mg  900 mg Oral TID Nira Conn A, NP   900 mg at 11/07/20 0820  . hydrOXYzine (ATARAX/VISTARIL) tablet 25 mg  25 mg Oral TID Nira Conn A, NP   25 mg at 11/06/20 1113  . magnesium hydroxide (MILK OF MAGNESIA) suspension 30 mL  30 mL Oral Daily PRN Nira Conn A, NP      . mirtazapine (REMERON) tablet 15 mg  15 mg Oral QHS Nira Conn A, NP   15 mg at 11/06/20 2151  . multivitamin with minerals tablet 1 tablet  1 tablet Oral Daily Antonieta Pert, MD   1 tablet at 11/07/20 0823  . nicotine (NICODERM CQ - dosed in mg/24 hours) patch 21 mg  21 mg Transdermal Daily Antonieta Pert, MD   21 mg at 11/07/20 0823  . ondansetron (ZOFRAN-ODT) disintegrating tablet 8 mg  8 mg Oral Q8H PRN Antonieta Pert, MD   8 mg at 11/06/20 2151  . OXcarbazepine (TRILEPTAL) tablet 75 mg  75 mg Oral BID Antonieta Pert, MD   75 mg at 11/07/20 4034  . traZODone (DESYREL) tablet 50 mg  50 mg Oral QHS PRN,MR X 1 Jaclyn Shaggy, PA-C   50 mg at 11/06/20 2151  . venlafaxine XR (EFFEXOR-XR) 24 hr capsule 300 mg  300 mg Oral Q breakfast Nira Conn A, NP   300 mg at 11/07/20 0820   PTA Medications: Medications Prior to Admission  Medication Sig Dispense Refill Last Dose  . acetaminophen (TYLENOL) 325 MG tablet Take 325-650 mg by mouth every 6 (six) hours as needed for mild pain or moderate pain.     Marland Kitchen amphetamine-dextroamphetamine (ADDERALL) 20 MG tablet Take 20 mg by mouth daily.     Marland Kitchen buPROPion (WELLBUTRIN SR) 100 MG 12 hr tablet Take 100 mg by mouth daily.     . ciprofloxacin (CIPRO) 500 MG tablet TAKE ONE TABLET BY MOUTH 2 TIMES A DAY FOR 7 DAYS (Patient not taking: No sig reported) 14 tablet 0   . diphenhydrAMINE (BENADRYL ALLERGY) 25 MG tablet Take 1 tablet (25 mg total) by mouth every 6 (six) hours as needed. (Patient not taking: No sig reported) 30 tablet 0   . famotidine (PEPCID) 20 MG tablet Take 1 tablet (20 mg total) by mouth 2 (two) times daily. (Patient not taking: No sig reported) 60 tablet 1   . gabapentin (NEURONTIN) 300 MG capsule Take 900 mg by mouth 3 (three) times daily.     . hydrOXYzine (ATARAX/VISTARIL) 25 MG tablet Take 25 mg by mouth 3 (three)  times daily.     Marland Kitchen ibuprofen (ADVIL) 600 MG tablet TAKE ONE TABLET BY MOUTH EVERY 8 HOURS AS NEEDED FOR MODERATE PAIN (Patient not taking: No sig reported) 20 tablet 0   . mirtazapine (REMERON) 15 MG tablet Take 15 mg by mouth at bedtime.     . naloxone (NARCAN) nasal spray 4 mg/0.1 mL Nasal spray in case of heroin overdose again 1 each 1   . ondansetron (ZOFRAN-ODT) 4 MG disintegrating tablet TAKE ONE TABLET BY MOUTH EVERY 8 HOURS AS NEEDED FOR NAUSEA OR VOMITING 12 tablet 0   . pseudoephedrine (SUDAFED) 120 MG 12 hr tablet Take 1 tablet (120 mg total) by mouth 2 (two) times daily as needed for congestion. (Patient not taking: No sig reported) 20 tablet 2   . triamcinolone ointment (KENALOG) 0.5 % Apply 1 application topically 2 (two) times daily. (Patient not taking: No sig reported) 30 g 0   . venlafaxine XR (EFFEXOR-XR) 150 MG 24 hr capsule Take 300 mg by mouth daily  with breakfast.      . VYVANSE 50 MG capsule Take 50 mg by mouth every morning.       Patient Stressors:    Patient Strengths:    Treatment Modalities: Medication Management, Group therapy, Case management,  1 to 1 session with clinician, Psychoeducation, Recreational therapy.   Physician Treatment Plan for Primary Diagnosis: <principal problem not specified> Long Term Goal(s): Improvement in symptoms so as ready for discharge Improvement in symptoms so as ready for discharge   Short Term Goals: Ability to identify changes in lifestyle to reduce recurrence of condition will improve Ability to verbalize feelings will improve Ability to disclose and discuss suicidal ideas Ability to demonstrate self-control will improve Ability to identify and develop effective coping behaviors will improve Ability to maintain clinical measurements within normal limits will improve Compliance with prescribed medications will improve Ability to identify triggers associated with substance abuse/mental health issues will improve Ability to identify changes in lifestyle to reduce recurrence of condition will improve Ability to verbalize feelings will improve Ability to disclose and discuss suicidal ideas Ability to demonstrate self-control will improve Ability to identify and develop effective coping behaviors will improve Ability to maintain clinical measurements within normal limits will improve Compliance with prescribed medications will improve Ability to identify triggers associated with substance abuse/mental health issues will improve  Medication Management: Evaluate patient's response, side effects, and tolerance of medication regimen.  Therapeutic Interventions: 1 to 1 sessions, Unit Group sessions and Medication administration.  Evaluation of Outcomes: Progressing  Physician Treatment Plan for Secondary Diagnosis: Active Problems:   Severe recurrent major depression (HCC)  Long Term  Goal(s): Improvement in symptoms so as ready for discharge Improvement in symptoms so as ready for discharge   Short Term Goals: Ability to identify changes in lifestyle to reduce recurrence of condition will improve Ability to verbalize feelings will improve Ability to disclose and discuss suicidal ideas Ability to demonstrate self-control will improve Ability to identify and develop effective coping behaviors will improve Ability to maintain clinical measurements within normal limits will improve Compliance with prescribed medications will improve Ability to identify triggers associated with substance abuse/mental health issues will improve Ability to identify changes in lifestyle to reduce recurrence of condition will improve Ability to verbalize feelings will improve Ability to disclose and discuss suicidal ideas Ability to demonstrate self-control will improve Ability to identify and develop effective coping behaviors will improve Ability to maintain clinical measurements within normal limits will improve  Compliance with prescribed medications will improve Ability to identify triggers associated with substance abuse/mental health issues will improve     Medication Management: Evaluate patient's response, side effects, and tolerance of medication regimen.  Therapeutic Interventions: 1 to 1 sessions, Unit Group sessions and Medication administration.  Evaluation of Outcomes: Progressing   RN Treatment Plan for Primary Diagnosis: <principal problem not specified> Long Term Goal(s): Knowledge of disease and therapeutic regimen to maintain health will improve  Short Term Goals: Ability to verbalize frustration and anger appropriately will improve, Ability to verbalize feelings will improve, Ability to disclose and discuss suicidal ideas and Ability to identify and develop effective coping behaviors will improve  Medication Management: RN will administer medications as ordered by  provider, will assess and evaluate patient's response and provide education to patient for prescribed medication. RN will report any adverse and/or side effects to prescribing provider.  Therapeutic Interventions: 1 on 1 counseling sessions, Psychoeducation, Medication administration, Evaluate responses to treatment, Monitor vital signs and CBGs as ordered, Perform/monitor CIWA, COWS, AIMS and Fall Risk screenings as ordered, Perform wound care treatments as ordered.  Evaluation of Outcomes: Progressing   LCSW Treatment Plan for Primary Diagnosis: <principal problem not specified> Long Term Goal(s): Safe transition to appropriate next level of care at discharge, Engage patient in therapeutic group addressing interpersonal concerns.  Short Term Goals: Engage patient in aftercare planning with referrals and resources, Increase ability to appropriately verbalize feelings, Increase emotional regulation, Identify triggers associated with mental health/substance abuse issues and Increase skills for wellness and recovery  Therapeutic Interventions: Assess for all discharge needs, 1 to 1 time with Social worker, Explore available resources and support systems, Assess for adequacy in community support network, Educate family and significant other(s) on suicide prevention, Complete Psychosocial Assessment, Interpersonal group therapy.  Evaluation of Outcomes: Not Progressing   Progress in Treatment: Attending groups: Yes. Participating in groups: Yes. Taking medication as prescribed: Yes. Toleration medication: Yes. Family/Significant other contact made: No, will contact:  patient declined contact with natural supports Patient understands diagnosis: Yes. Discussing patient identified problems/goals with staff: Yes. Medical problems stabilized or resolved: No. Denies suicidal/homicidal ideation: Yes. Issues/concerns per patient self-inventory: No.  New problem(s) identified: No, Describe:   none  New Short Term/Long Term Goal(s):   medication stabilization, elimination of SI thoughts, development of comprehensive mental wellness plan.    Patient Goals:  Patient states "would like to stabilize medications."  Discharge Plan or Barriers:   Reason for Continuation of Hospitalization: Anxiety Depression Suicidal ideation  Estimated Length of Stay: 3-5 days Attendees: [name] 11/07/2020   Physician: Clifton Custard, MD 11/07/2020   Nursing:  11/07/2020   RN Care Manager: 11/07/2020   Social Worker: Anson [place], LCSW 11/07/2020   Recreational Therapist:  11/07/2020   Other:  11/07/2020   Other:  11/07/2020   Other: 11/07/2020        Scribe for Treatment Team: Beatris Si, LCSW 11/07/2020 11:30 AM

## 2020-11-07 NOTE — [facility] Group Notes (Signed)
ADULT GRIEF GROUP NOTE:   Spiritual care group on grief and loss facilitated by Sandy Mason, [facility]   Group Goal:   Support / Education around grief and loss   Members engage in facilitated group support and psycho-social education.   Group Description:   Following introductions and group rules, group members engaged in facilitated group dialog and support around topic of loss, with particular support around experiences of loss in their lives. Group Identified types of loss (relationships / self / things) and identified patterns, circumstances, and changes that precipitate losses. Reflected on thoughts / feelings around loss, normalized grief responses, and recognized variety in grief experience. Group noted Worden's four tasks of grief in discussion.   Group drew on Adlerian / Rogerian, narrative, MI,   Patient Progress:  Sandy Mason attended and participated in group. She reported feeling very anxious during group and did leave for some time before coming back.   Chaplain Sandy Mason, [facility] Pager, [phone] Office: [phone] 3:19 PM

## 2020-11-07 NOTE — Progress Notes (Signed)
@   1637 Pt complaining of chest pain "tight and heavy" BP 137/81 p 87, temp 97.5, 02 99%; Pt given 650 mg of Tylenol for 10/10 HA pain and 0.5 Ativan for anxiety 11/10. Dr. Truitt Merle.

## 2020-11-08 MED ORDER — NICOTINE POLACRILEX 2 MG MT GUM: 2.0000 mg | CHEWING_GUM | OROMUCOSAL | Status: DC | PRN

## 2020-11-08 MED ORDER — OXCARBAZEPINE 150 MG PO TABS
150.0000 mg | ORAL_TABLET | Freq: Two times a day (BID) | ORAL | Status: DC
  Administered 2020-11-08 – 2020-11-11 (×6): 150 mg via ORAL
  Filled 2020-11-08 (×11): qty 1

## 2020-11-08 MED ORDER — LORAZEPAM 0.5 MG PO TABS
0.5000 mg | ORAL_TABLET | Freq: Once | ORAL | Status: AC
  Administered 2020-11-08: 0.5 mg via ORAL

## 2020-11-08 MED ORDER — LORAZEPAM 0.5 MG PO TABS
ORAL_TABLET | ORAL | Status: AC
  Filled 2020-11-08: qty 1

## 2020-11-08 MED ORDER — HYDROXYZINE HCL 25 MG PO TABS
25.0000 mg | ORAL_TABLET | Freq: Four times a day (QID) | ORAL | Status: DC | PRN
  Administered 2020-11-10 (×2): 25 mg via ORAL
  Filled 2020-11-08 (×3): qty 1

## 2020-11-08 NOTE — Progress Notes (Signed)
Pt denies SI/HI/AVH.  Pt complains of significant anxiety.  One time order for ativan received.  Pt said after taking ativan, pt felt "much much better."  Pt is interacting with peers and in no distress at this time.  Pt remains safe on the unit with q 15 min checks in place.

## 2020-11-08 NOTE — Progress Notes (Signed)
Recreation Therapy Notes  Animal-Assisted Activity (AAA) Program Checklist/Progress Notes Patient Eligibility Criteria Checklist & Daily Group note for Rec Tx Intervention  Date: 4.12.22 Time: 1430 Location: [address]   AAA/T Program Assumption of Risk Form signed by Engineer, production or Parent Legal Guardian YES  Patient is free of allergies or severe asthma YES   Patient reports no fear of animals YES   Patient reports no history of cruelty to animals YES   Patient understands his/her participation is voluntary YES  Patient washes hands before animal contact YES   Patient washes hands after animal contact YES   Behavioral Response: Engaged  Education: Charity fundraiser, Appropriate Animal Interaction   Education Outcome: Acknowledges understanding/In group clarification offered/Needs additional education.   Clinical Observations/Feedback: Pt attended and participated in group activity.    Caroll Rancher, LRT/CTRS    Caroll Rancher A 11/08/2020 3:26 PM

## 2020-11-08 NOTE — Progress Notes (Signed)
[facility] Group Notes:  (Nursing/MHT/Case Management/Adjunct)  Date:  11/08/2020  Time:  2015 Type of Therapy:  wrap up group  Participation Level:  Active  Participation Quality:  Appropriate, Attentive, Sharing and Supportive  Affect:  Appropriate  Cognitive:  Appropriate  Insight:  Improving  Engagement in Group:  Engaged  Modes of Intervention:  Clarification, Education and Support  Summary of Progress/Problems: Positive thinking and self-care were discussed.   Sandy Mason 11/08/2020, 9:01 PM

## 2020-11-08 NOTE — Progress Notes (Signed)
   11/07/20 2128  Psych Admission Type (Psych Patients Only)  Admission Status Voluntary  Psychosocial Assessment  Patient Complaints Anxiety;Insomnia  Eye Contact Avoids  Facial Expression Anxious;Angry  Affect Irritable  Speech Logical/coherent  Interaction Avoidant  Motor Activity Slow  Appearance/Hygiene Unremarkable  Behavior Characteristics Agitated;Irritable  Mood Depressed;Irritable  Thought Process  Coherency WDL  Content Blaming others  Delusions None reported or observed  Perception WDL  Hallucination None reported or observed  Judgment Impaired  Confusion None  Danger to Self  Current suicidal ideation? Denies  Danger to Others  Danger to Others None reported or observed  Sandy Mason has been in her room all evening.  When she came up for her bedtime medications, she was unhappy that she didn't have lorazepam and Abilify for bedtime.  Encouraged her to speak with her MD tomorrow.  She denied any SI and will seek out staff if that changes.  She only provided brief answers and promptly returned to her room.  She is currently sleeping well.  Q 15 minute checks maintained for safety.  We will continue to monitor the progress towards her goals.

## 2020-11-08 NOTE — Progress Notes (Signed)
[facility] MD Progress Note  11/08/2020 4:44 PM Sandy Mason  MRN:  [id number]   Subjective: "I am having a lot of anxiety.  It makes me feel nausea's have a headache and chest pain."  Objective: Record reviewed.  Patient's case was discussed in detail with members of the treatment team.  Patient was seen and interviewed in the patient's room this afternoon. She reports continued anxiety accompanied by physical symptoms including chest pain, headache and nausea.  Patient has been trying to attend groups to distract herself but reports her anxiety is difficult to manage.  She is requesting one-time dose of Ativan 0.5 mg for anxiety.  We discussed other possible medications for anxiety.  Patient reports that she had a bad reaction to BuSpar and it caused her jaw to lock up.  She reports hydroxyzine is ineffective.  I discussed with patient possible nonpharmacologic ways of managing her anxiety such as breathing exercises, muscle relaxation, meditation, exercise, relaxing music, taking a bath or shower, distracting herself by drawing or journaling, etc.  Patient stated understanding and agreed to make a list of nonpharmacologic ways she could manage her anxiety. The patient denies passive wish for death or suicidal ideation.  She denies AI, HI, AH, VH or PI.  The patient denies side effects to her current psychiatric medications.  She agreeable to increasing her Trileptal dose and looks forward to starting Abilify tonight.  The patient reports she did not sleep well last night and experienced middle insomnia.  Appetite is okay.  She denies other physical symptoms.  Per chart review and nursing report, the patient has been anxious and somatically focused.  She has not engaged in any self-injurious behavior or aggressive behavior on the unit.  She has been attending select groups.  The patient is taking standing dose medications as prescribed set up for hydroxyzine and Ensure supplements.  She took as needed  acetaminophen twice today and as needed Zofran once today.  She took trazodone 50 mg nightly once last night for sleep.  Principal Problem: Severe recurrent major depression (HCC) Diagnosis: Principal Problem:   Severe recurrent major depression (HCC)  Total Time spent with patient: 20 minutes  Past Psychiatric History: See H&P  Past Medical History:  Past Medical History:  Diagnosis Date  . Adult ADHD 05/2020  . Alcohol use disorder severe. 12/18/2014  . Anxiety   . Borderline personality disorder (HCC) 12/18/2014  . CVA (cerebral infarction)   . Depression   . 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 Opioid use disorder, severe, dependence (HCC) 12/20/2014  . PTSD (post-traumatic stress disorder) 03/28/2015  . 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 Seizures (HCC)   . Suicide attempt by hanging [facility])     Past Surgical History:  Procedure Laterality Date  . NO PAST SURGERIES     Family History:  Family History  Problem Relation Age of Onset  . Diabetes Father   . Dementia Mother   . Anxiety disorder Mother    Family Psychiatric  History: See H&P Social History:  Social History   Substance and Sexual Activity  Alcohol Use Yes   Comment: socially     Social History   Substance and Sexual Activity  Drug Use Yes  . Types: Benzodiazepines, Hydrocodone   Comment: prescribed vivance and adderol    Social History   Socioeconomic History  . Marital status: Single    Spouse name: Not on file  .  Number of children: Not on file  . Years of education: Not on file  . Highest education level: Not on file  Occupational History  . Not on file  Tobacco Use  . Smoking status: Current Every Day Smoker    Packs/day: 0.00    Years: 20.00    Pack years: 0.00    Types: Cigarettes  . Smokeless tobacco: Never Used  . Tobacco comment: 1-2 cigarettes per day  Vaping Use  . Vaping Use: Never  used  Substance and Sexual Activity  . Alcohol use: Yes    Comment: socially  . Drug use: Yes    Types: Benzodiazepines, Hydrocodone    Comment: prescribed vivance and adderol  . Sexual activity: Yes    Birth control/protection: I.U.D.  Other Topics Concern  . Not on file  Social History Narrative  . Not on file   Social Determinants of Health   Financial Resource Strain: Not on file  Food Insecurity: Not on file  Transportation Needs: Not on file  Physical Activity: Not on file  Stress: Not on file  Social Connections: Not on file   Additional Social History:                         Sleep: Fair per patient report.  Appetite:  Good  Current Medications: Current Facility-Administered Medications  Medication Dose Route Frequency Provider Last Rate Last Admin  . acetaminophen (TYLENOL) tablet 650 mg  650 mg Oral Q6H PRN Nira ConnBerry, Jason A, NP   650 mg at 11/08/20 1403  . alum & mag hydroxide-simeth (MAALOX/MYLANTA) 200-200-20 MG/5ML suspension 30 mL  30 mL Oral Q4H PRN Nira ConnBerry, Jason A, NP      . ARIPiprazole (ABILIFY) tablet 5 mg  5 mg Oral QHS Claudie ReveringJames, Mixtli Reno L, MD      . feeding supplement (ENSURE ENLIVE / ENSURE PLUS) liquid 237 mL  237 mL Oral Q24H Antonieta Pertlary, Greg Lawson, MD      . gabapentin (NEURONTIN) capsule 900 mg  900 mg Oral TID Nira ConnBerry, Jason A, NP   900 mg at 11/08/20 1257  . hydrOXYzine (ATARAX/VISTARIL) tablet 25 mg  25 mg Oral Q6H PRN Claudie ReveringJames, Faatima Tench L, MD      . LORazepam (ATIVAN) 0.5 MG tablet           . magnesium hydroxide (MILK OF MAGNESIA) suspension 30 mL  30 mL Oral Daily PRN Nira ConnBerry, Jason A, NP      . mirtazapine (REMERON) tablet 15 mg  15 mg Oral QHS Nira ConnBerry, Jason A, NP   15 mg at 11/07/20 2128  . multivitamin with minerals tablet 1 tablet  1 tablet Oral Daily Antonieta Pertlary, Greg Lawson, MD   1 tablet at 11/08/20 0748  . nicotine (NICODERM CQ - dosed in mg/24 hours) patch 21 mg  21 mg Transdermal Daily Antonieta Pertlary, Greg Lawson, MD   21 mg at 11/08/20 0750  . ondansetron  (ZOFRAN-ODT) disintegrating tablet 8 mg  8 mg Oral Q8H PRN Antonieta Pertlary, Greg Lawson, MD   8 mg at 11/08/20 1257  . OXcarbazepine (TRILEPTAL) tablet 75 mg  75 mg Oral BID Antonieta Pertlary, Greg Lawson, MD   75 mg at 11/08/20 0748  . traZODone (DESYREL) tablet 50 mg  50 mg Oral QHS PRN,MR X 1 Jaclyn Shaggyaylor, Cody W, PA-C   50 mg at 11/07/20 2128  . venlafaxine XR (EFFEXOR-XR) 24 hr capsule 300 mg  300 mg Oral Q breakfast Nira ConnBerry, Jason A, NP   300 mg  at 11/08/20 0749    Lab Results:  No results found for this or any previous visit (from the past 48 hour(s)).  Blood Alcohol level:  Lab Results  Component Value Date   ETH <10 11/04/2020   ETH 160 (H) 12/29/2019    Metabolic Disorder Labs: Lab Results  Component Value Date   HGBA1C 5.6 11/06/2020   MPG 114.02 11/06/2020   MPG 119.76 04/12/2017   Lab Results  Component Value Date   PROLACTIN 98.1 (H) 12/12/2015   Lab Results  Component Value Date   CHOL 173 11/06/2020   TRIG 175 (H) 11/06/2020   HDL 39 (L) 11/06/2020   CHOLHDL 4.4 11/06/2020   VLDL 35 11/06/2020   LDLCALC 99 11/06/2020   LDLCALC 80 04/12/2017    Physical Findings: AIMS: Facial and Oral Movements Muscles of Facial Expression: None, normal Lips and Perioral Area: None, normal Jaw: None, normal Tongue: None, normal,Extremity Movements Upper (arms, wrists, hands, fingers): Moderate Lower (legs, knees, ankles, toes): Minimal, Trunk Movements Neck, shoulders, hips: None, normal, Overall Severity Severity of abnormal movements (highest score from questions above): Severe Incapacitation due to abnormal movements: None, normal Patient's awareness of abnormal movements (rate only patient's report): No Awareness, Dental Status Current problems with teeth and/or dentures?: No Does patient usually wear dentures?: No  CIWA:  CIWA-Ar Total: 7 COWS:     Musculoskeletal: Strength & Muscle Tone: within normal limits Gait & Station: normal Patient leans: N/A  Psychiatric Specialty  Exam:  Presentation  General Appearance: Appropriate for Environment; Casual  Eye Contact:Good  Speech:Clear and Coherent; Normal Rate  Speech Volume:Normal  Handedness:Right   Mood and Affect  Mood:Anxious  Affect:Congruent   Thought Process  Thought Processes:Coherent; Goal Directed  Descriptions of Associations:Intact  Orientation:Full (Time, Place and Person)  Thought Content:Logical; Rumination; Other (comment) (Somatic focus)  History of Schizophrenia/Schizoaffective disorder:No  Duration of Psychotic Symptoms:No data recorded Hallucinations:Hallucinations: None  Ideas of Reference:None  Suicidal Thoughts:Suicidal Thoughts: No SI Passive Intent and/or Plan: Without Intent; Without Plan  Homicidal Thoughts:Homicidal Thoughts: No   Sensorium  Memory:Immediate Good; Recent Good; Remote Good  Judgment:Fair  Insight:Fair   Executive Functions  Concentration:Good  Attention Span:Good  Recall:Good  Fund of Knowledge:Good  Language:Good   Psychomotor Activity  Psychomotor Activity:Psychomotor Activity: Normal   Assets  Assets:Communication Skills; Desire for Improvement; Housing; Resilience; Social Support; Vocational/Educational   Sleep  Sleep:Sleep: Fair    Physical Exam: Physical Exam Vitals and nursing note reviewed.  HENT:     Head: Normocephalic and atraumatic.  Neurological:     General: No focal deficit present.     Mental Status: She is alert and oriented to person, place, and time.    Review of Systems  Constitutional: Negative for chills, diaphoresis and fever.  HENT: Negative for sore throat.   Eyes: Negative for blurred vision.  Respiratory: Negative for cough and shortness of breath.   Cardiovascular: Positive for chest pain. Negative for palpitations.       Positive for chest pain when anxious  Gastrointestinal: Positive for nausea. Negative for constipation, diarrhea and vomiting.  Genitourinary: Negative for  dysuria.  Musculoskeletal: Negative for joint pain and myalgias.  Skin: Negative for rash.  Neurological: Positive for headaches. Negative for dizziness, tremors and seizures.  Endo/Heme/Allergies: Does not bruise/bleed easily.  Psychiatric/Behavioral: Negative for hallucinations and suicidal ideas. The patient is nervous/anxious and has insomnia.    Blood pressure 106/68, pulse 87, temperature (!) 97.5 F (36.4 C), temperature source Oral, resp. rate 18,  height 5\' 8"  (1.727 m), weight 94.3 kg, SpO2 99 %. Body mass index is 31.63 kg/m.   Treatment Plan Summary: The patient is a 45 year old female with a past psychiatric history significant for depression, anxiety, alcohol use disorder, previous suicide attempts, cocaine use disorder and reported attention deficit disorder who presented to the [facility] emergency department on 11/04/2020 with suicidal ideation in the context of poor adherence to outpatient psychiatric medications, substance use (UDS positive for cocaine, THC and amphetamines although patient was prescribed amphetamines) and increased psychosocial stressors.  Inpatient psychiatric hospitalization is needed for further treatment and stabilization of mood and anxiety symptoms and for safety of patient.  Daily contact with patient to assess and evaluate symptoms and progress in treatment and Medication management   Continue every 15-minute observation status for safety of patient.  Encouraged participation in group therapy and therapeutic milieu  Depression/mood stabilization -Continue Effexor XR 300 mg daily for depression and anxiety -Continue mirtazapine 15 mg nightly for depression -Increase Trileptal to 150 mg twice daily this evening for mood stabilization -Continue Abilify 5 mg nightly for mood stabilization and adjunctive treatment of depression  Anxiety -Continue Effexor XR 300 mg daily for anxiety and depression -Hydroxyzine 25 mg 3 times daily  as needed anxiety -Patient has been advised to make a list of nonpharmacologic activities that will help her to reduce her anxiety.  She is in agreement with making this list. -I ordered one-time dose of Ativan 0.5 mg to be administered this afternoon.  I have discussed with patient that the plan is for her to be discharged without a prescription for benzodiazepines and that the use of Ativan will not be standing dose or routinely as needed dosing.     Insomnia -Continue trazodone 50 mg nightly as needed insomnia  Substance use disorder -Would benefit from participating in substance use disorder treatment program after discharge.  EKG performed yesterday 11/07/2020 revealed normal sinus rhythm, ventricular rate of 76 and QT/QTC of 406/456.  EKG performed 11/06/2020 revealed normal sinus rhythm with ventricular rate of 78 and QT/QTC of 364/414.  Social work is working on 01/06/2021.  Patient would benefit from referral to substance use disorder treatment program, partial hospital program or some group therapy treatment setting if available in addition to individual therapy and outpatient psychiatry follow-up.  I certify that inpatient services furnished can reasonably be expected to improve the patient's condition.  Public house manager, MD 11/08/2020, 4:44 PM

## 2020-11-08 NOTE — [facility] Group Notes (Signed)
[facility] Group Notes:  (Nursing/MHT/Case Management/Adjunct)  Date:  11/08/2020  Time:  10:56 AM  Type of Therapy: psycho educational group  Participation Level:  Did Not Attend  Participation Quality:  na  Affect:  na  Cognitive:  na  Insight:  None  Engagement in Group:  na  Modes of Intervention:  na  Summary of Progress/Problems: pt did not attend.  Malva Limes 11/08/2020, 10:56 AM

## 2020-11-08 NOTE — Progress Notes (Signed)
Sandy Mason was noted snoring loudly and interrupting her roommates ability to sleep.  Roommate tried ear plugs without success and requested to move to another room.  No roommate order for 24 hours and will reevaluated tomorrow.

## 2020-11-09 MED ORDER — ARIPIPRAZOLE 5 MG PO TABS
5.0000 mg | ORAL_TABLET | Freq: Every day | ORAL | Status: DC
  Administered 2020-11-09 – 2020-11-11 (×3): 5 mg via ORAL
  Filled 2020-11-09 (×5): qty 1

## 2020-11-09 NOTE — Progress Notes (Signed)
[facility] MD Progress Note  11/09/2020 2:59 PM Karmel Patricelli  MRN:  [id number]   Subjective: Zareen reports, "I have been here since Sunday due to severe anxiety & suicidal thoughts. I till have the extreme anxiety & not the suicidal thoughts. The suicidal thoughts went away. I'm focused on getting my anxiety down. I do believe that my worsening anxiety was due to a recent change on my medication. The change was made due to the side effects I was having at the time. Ativan was the only medicine that has actually helped me in the past. I just need the doctor to give me me just one more dose of the 0.5 mg Ativan. I can't even focus on group sessions because of anxiety".  Objective: Brylynn is seen, chart reviewed. The chart findings discussed with the treatment team. She presents alert, oriented & aware of situation. She is visible on the unit, attending group sessions. She presents today complaining of high anxiety levels. She says she is unable to focus in the group sessions because of high anxiety levels. She is asking for Ativan as it was on the medication that has ever helped her with anxiety. She denies any nausea, headaches or chest pains today. However, she did say whenever her anxiety worsens, come the other mentioned symptoms. She has asked for her Abilify to be given to her during the day instead of at night starting this afternoon. She denies any SIHI, AVH, delusional thoughts or paranoia. She does not appear to be responding to any internal to any internal stimuli. The request for Ativan by the patient is declined as she was made aware yesterday that she will only receive a dose of the Ativan 0.5 mg once yesterday. She continues on her current plan of care as already in progress.  Previous notes: Record reviewed.  Patient's case was discussed in detail with members of the treatment team.  Patient was seen and interviewed in the patient's room this afternoon. She reports continued anxiety  accompanied by physical symptoms including chest pain, headache and nausea.  Patient has been trying to attend groups to distract herself but reports her anxiety is difficult to manage.  She is requesting one-time dose of Ativan 0.5 mg for anxiety.  We discussed other possible medications for anxiety.  Patient reports that she had a bad reaction to BuSpar and it caused her jaw to lock up.  She reports hydroxyzine is ineffective.  I discussed with patient possible nonpharmacologic ways of managing her anxiety such as breathing exercises, muscle relaxation, meditation, exercise, relaxing music, taking a bath or shower, distracting herself by drawing or journaling, etc.  Patient stated understanding and agreed to make a list of nonpharmacologic ways she could manage her anxiety. The patient denies passive wish for death or suicidal ideation.  She denies AI, HI, AH, VH or PI.  The patient denies side effects to her current psychiatric medications.  She agreeable to increasing her Trileptal dose and looks forward to starting Abilify tonight.  The patient reports she did not sleep well last night and experienced middle insomnia.  Appetite is okay.  She denies other physical symptoms.  Per chart review and nursing report, the patient has been anxious and somatically focused.  She has not engaged in any self-injurious behavior or aggressive behavior on the unit.  She has been attending select groups.  The patient is taking standing dose medications as prescribed set up for hydroxyzine and Ensure supplements.  She took as needed acetaminophen  twice today and as needed Zofran once today.  She took trazodone 50 mg nightly once last night for sleep.  Principal Problem: Severe recurrent major depression (HCC) Diagnosis: Principal Problem:   Severe recurrent major depression (HCC)  Total Time spent with patient: 15 minutes  Past Psychiatric History: See H&P  Past Medical History:  Past Medical History:  Diagnosis  Date  . Adult ADHD 05/2020  . Alcohol use disorder severe. 12/18/2014  . Anxiety   . Borderline personality disorder (HCC) 12/18/2014  . CVA (cerebral infarction)   . Depression   . 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 Opioid use disorder, severe, dependence (HCC) 12/20/2014  . PTSD (post-traumatic stress disorder) 03/28/2015  . 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 Seizures (HCC)   . Suicide attempt by hanging [facility](HCC)     Past Surgical History:  Procedure Laterality Date  . NO PAST SURGERIES     Family History:  Family History  Problem Relation Age of Onset  . Diabetes Father   . Dementia Mother   . Anxiety disorder Mother    Family Psychiatric  History: See H&P Social History:  Social History   Substance and Sexual Activity  Alcohol Use Yes   Comment: socially     Social History   Substance and Sexual Activity  Drug Use Yes  . Types: Benzodiazepines, Hydrocodone   Comment: prescribed vivance and adderol    Social History   Socioeconomic History  . Marital status: Single    Spouse name: Not on file  . Number of children: Not on file  . Years of education: Not on file  . Highest education level: Not on file  Occupational History  . Not on file  Tobacco Use  . Smoking status: Current Every Day Smoker    Packs/day: 0.00    Years: 20.00    Pack years: 0.00    Types: Cigarettes  . Smokeless tobacco: Never Used  . Tobacco comment: 1-2 cigarettes per day  Vaping Use  . Vaping Use: Never used  Substance and Sexual Activity  . Alcohol use: Yes    Comment: socially  . Drug use: Yes    Types: Benzodiazepines, Hydrocodone    Comment: prescribed vivance and adderol  . Sexual activity: Yes    Birth control/protection: I.U.D.  Other Topics Concern  . Not on file  Social History Narrative  . Not on file   Social Determinants of Health   Financial Resource Strain: Not on  file  Food Insecurity: Not on file  Transportation Needs: Not on file  Physical Activity: Not on file  Stress: Not on file  Social Connections: Not on file   Additional Social History:   Sleep: Fair, per patient report.  Appetite:  Good  Current Medications: Current Facility-Administered Medications  Medication Dose Route Frequency Provider Last Rate Last Admin  . acetaminophen (TYLENOL) tablet 650 mg  650 mg Oral Q6H PRN Nira ConnBerry, Jason A, NP   650 mg at 11/09/20 0946  . alum & mag hydroxide-simeth (MAALOX/MYLANTA) 200-200-20 MG/5ML suspension 30 mL  30 mL Oral Q4H PRN Nira ConnBerry, Jason A, NP      . ARIPiprazole (ABILIFY) tablet 5 mg  5 mg Oral QHS Claudie ReveringJames, Martha L, MD   5 mg at 11/08/20 2106  . feeding supplement (ENSURE ENLIVE / ENSURE PLUS) liquid 237 mL  237 mL Oral Q24H Antonieta Pertlary, Greg Lawson, MD   237 mL at 11/08/20 2109  .  gabapentin (NEURONTIN) capsule 900 mg  900 mg Oral TID Nira Conn A, NP   900 mg at 11/09/20 1214  . hydrOXYzine (ATARAX/VISTARIL) tablet 25 mg  25 mg Oral Q6H PRN Claudie Revering, MD      . magnesium hydroxide (MILK OF MAGNESIA) suspension 30 mL  30 mL Oral Daily PRN Nira Conn A, NP      . mirtazapine (REMERON) tablet 15 mg  15 mg Oral QHS Nira Conn A, NP   15 mg at 11/08/20 2106  . multivitamin with minerals tablet 1 tablet  1 tablet Oral Daily Antonieta Pert, MD   1 tablet at 11/09/20 0900  . nicotine (NICODERM CQ - dosed in mg/24 hours) patch 21 mg  21 mg Transdermal Daily Antonieta Pert, MD   21 mg at 11/09/20 0900  . nicotine polacrilex (NICORETTE) gum 2 mg  2 mg Oral PRN Kylee Umana, Uchenna E, PA      . ondansetron (ZOFRAN-ODT) disintegrating tablet 8 mg  8 mg Oral Q8H PRN Antonieta Pert, MD   8 mg at 11/09/20 0946  . OXcarbazepine (TRILEPTAL) tablet 150 mg  150 mg Oral BID Claudie Revering, MD   150 mg at 11/09/20 0900  . traZODone (DESYREL) tablet 50 mg  50 mg Oral QHS PRN,MR X 1 Jaclyn Shaggy, PA-C   50 mg at 11/09/20 0119  . venlafaxine XR  (EFFEXOR-XR) 24 hr capsule 300 mg  300 mg Oral Q breakfast Nira Conn A, NP   300 mg at 11/09/20 0900   Lab Results:  No results found for this or any previous visit (from the past 48 hour(s)).  Blood Alcohol level:  Lab Results  Component Value Date   ETH <10 11/04/2020   ETH 160 (H) 12/29/2019   Metabolic Disorder Labs: Lab Results  Component Value Date   HGBA1C 5.6 11/06/2020   MPG 114.02 11/06/2020   MPG 119.76 04/12/2017   Lab Results  Component Value Date   PROLACTIN 98.1 (H) 12/12/2015   Lab Results  Component Value Date   CHOL 173 11/06/2020   TRIG 175 (H) 11/06/2020   HDL 39 (L) 11/06/2020   CHOLHDL 4.4 11/06/2020   VLDL 35 11/06/2020   LDLCALC 99 11/06/2020   LDLCALC 80 04/12/2017   Physical Findings: AIMS: Facial and Oral Movements Muscles of Facial Expression: None, normal Lips and Perioral Area: None, normal Jaw: None, normal Tongue: None, normal,Extremity Movements Upper (arms, wrists, hands, fingers): Moderate Lower (legs, knees, ankles, toes): Minimal, Trunk Movements Neck, shoulders, hips: None, normal, Overall Severity Severity of abnormal movements (highest score from questions above): Severe Incapacitation due to abnormal movements: None, normal Patient's awareness of abnormal movements (rate only patient's report): No Awareness, Dental Status Current problems with teeth and/or dentures?: No Does patient usually wear dentures?: No  CIWA:  CIWA-Ar Total: 7 COWS:     Musculoskeletal: Strength & Muscle Tone: within normal limits Gait & Station: normal Patient leans: N/A  Psychiatric Specialty Exam:  Presentation  General Appearance: Appropriate for Environment; Casual  Eye Contact:Good  Speech:Clear and Coherent; Normal Rate  Speech Volume:Normal  Handedness:Right  Mood and Affect  Mood:Anxious  Affect:Congruent  Thought Process  Thought Processes:Coherent; Goal Directed  Descriptions of  Associations:Intact  Orientation:Full (Time, Place and Person)  Thought Content:Logical; Rumination; Other (comment) (Somatic focus)  History of Schizophrenia/Schizoaffective disorder:No  Duration of Psychotic Symptoms:No data recorded Hallucinations:Hallucinations: None  Ideas of Reference:None  Suicidal Thoughts:Suicidal Thoughts: No  Homicidal Thoughts:Homicidal Thoughts: No  Sensorium  Memory:Immediate Good; Recent Good; Remote Good  Judgment:Fair  Insight:Fair  Executive Functions  Concentration:Good  Attention Span:Good  Recall:Good  Fund of Knowledge:Good  Language:Good  Psychomotor Activity  Psychomotor Activity:Psychomotor Activity: Normal  Assets  Assets:Communication Skills; Desire for Improvement; Housing; Resilience; Social Support; Vocational/Educational  Sleep  Sleep:Sleep: Fair  Physical Exam: Physical Exam Vitals and nursing note reviewed.  HENT:     Head: Normocephalic and atraumatic.     Mouth/Throat:     Pharynx: Oropharynx is clear.  Eyes:     Pupils: Pupils are equal, round, and reactive to light.  Genitourinary:    Comments: Deferred Musculoskeletal:        General: Normal range of motion.     Cervical back: Normal range of motion.  Skin:    General: Skin is warm and dry.  Neurological:     General: No focal deficit present.     Mental Status: She is alert and oriented to person, place, and time.    Review of Systems  Constitutional: Negative for chills, diaphoresis and fever.  HENT: Negative for congestion and sore throat.   Eyes: Negative for blurred vision.  Respiratory: Negative for cough and shortness of breath.   Cardiovascular: Negative for chest pain and palpitations.       Positive for chest pain when anxious  Gastrointestinal: Negative for abdominal pain, constipation, diarrhea, heartburn, nausea and vomiting.  Genitourinary: Negative for dysuria.  Musculoskeletal: Negative for joint pain and myalgias.  Skin:  Negative for rash.  Neurological: Positive for headaches. Negative for dizziness, tremors and seizures.  Endo/Heme/Allergies: Does not bruise/bleed easily.  Psychiatric/Behavioral: Positive for depression. Negative for hallucinations, memory loss and suicidal ideas. The patient is nervous/anxious and has insomnia.    Blood pressure 125/78, pulse 84, temperature (!) 97.4 F (36.3 C), temperature source Oral, resp. rate 16, height  (1.727 m), weight 94.3 kg, SpO2 100 %. Body mass index is 31.63 kg/m.  Treatment Plan Summary: The patient is a 45 year old female with a past psychiatric history significant for depression, anxiety, alcohol use disorder, previous suicide attempts, cocaine use disorder and reported attention deficit disorder who presented to the [facility] emergency department on 11/04/2020 with suicidal ideation in the context of poor adherence to outpatient psychiatric medications, substance use (UDS positive for cocaine, THC and amphetamines although patient was prescribed amphetamines) and increased psychosocial stressors.  Inpatient psychiatric hospitalization is needed for further treatment and stabilization of mood and anxiety symptoms and for safety of patient.  Daily contact with patient to assess and evaluate symptoms and progress in treatment and Medication management   Continue every 15-minute observation status for safety of patient. Encouraged participation in group therapy and therapeutic milieu  Depression/mood stabilization -Continue Effexor XR 300 mg daily for depression and anxiety -Continue mirtazapine 15 mg nightly for depression -Continue Trileptal 150 mg twice daily for mood stabilization -Continue Abilify 5 mg nightly for mood stabilization and adjunctive treatment of depression  Anxiety -Continue Effexor XR 300 mg daily for anxiety and depression -Hydroxyzine 25 mg 3 times daily as needed anxiety -Patient has been advised to make a  list of nonpharmacologic activities that will help her to reduce her anxiety.  She is in agreement with making this list. -Was ordered one-time dose of Ativan 0.5 mg that was administered yesterday afternoon.  Discussed with patient that the plan is for her to be discharged without a prescription for benzodiazepines and that the use of Ativan will not be standing  dose or routinely as needed dosing.     Insomnia -Continue trazodone 50 mg nightly as needed insomnia  Substance use disorder -Would benefit from participating in substance use disorder treatment program after discharge.  EKG performed 2 days ago on 11/07/2020 revealed normal sinus rhythm, ventricular rate of 76 and QT/QTC of 406/456.  EKG performed 11/06/2020 revealed normal sinus rhythm with ventricular rate of 78 and QT/QTC of 364/414.  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  Patient would benefit from referral to substance use disorder treatment program, partial hospital program or some group therapy treatment setting if available in addition to individual therapy and outpatient psychiatry follow-up.  I certify that inpatient services furnished can reasonably be expected to improve the patient's condition.  Armandina Stammer, NP, PMHNP, FNP-BC. 11/09/2020, 2:59 PMPatient ID: Deboraha Sprang, female   DOB: 20-Sep-1975, 45 y.o.   MRN: [id number]

## 2020-11-09 NOTE — Progress Notes (Signed)
Recreation Therapy Notes  Date: 4.13.22 Time: 0930 Location: [address] Hall Dayroom  Group Topic: Stress Management  Goal Area(s) Addresses:  Patient will identify positive stress management techniques. Patient will identify benefits of using stress management post d/c.  Intervention: Stress Management  Activity :  Meditation.  LRT played a meditation that focused on setting yourself free from holding on to things that bring you down and accept that those things are out of your control.  Patients were to listen and follow along with the meditation at it played to fully engage in activity.  Education:  Stress Management, Discharge Planning.   Education Outcome: Acknowledges Education  Clinical Observations/Feedback: Pt did not attend group session.     Caroll Rancher, LRT/CTRS         Caroll Rancher A 11/09/2020 12:42 PM

## 2020-11-09 NOTE — Plan of Care (Signed)
Nurse discussed coping skills with patient.  

## 2020-11-09 NOTE — [facility] Group Notes (Signed)
11/09/2020 1:15pm   Type of Therapy and Topic:  Group Therapy:  Positive Affirmations   Participation Level:  Active  Description of Group: This group addressed positive affirmation toward self and others. Patients went around the room and identified two positive things about themselves. Patients reflected on how it felt to share something positive with others, to identify positive things about themselves, and to hear positive things from others. Patients were encouraged to have a daily reflection of positive characteristics or circumstances.  Therapeutic Goals 1. Patient will verbalize two of their positive qualities 2. Patient will verbalize their feelings when voicing positive self affirmations and when voicing positive affirmations of others 3. Patients will discuss the potential positive impact on their wellness/recovery of focusing on positive traits of self and others. Summary of Patient Progress: Pt shared that she is strong and resilient.    Therapeutic Modalities Cognitive Behavioral Therapy Motivational Interviewing  Sandy Mason 11/09/2020 2:42 PM

## 2020-11-09 NOTE — Progress Notes (Signed)
D:  Patient denied SI and HI, contracts for safety.  Denied A/V hallucinations.  Denied pain. A:  Medications administered per MD orders.  Emotional support and encouragement given patient. R:  Safety maintained with 15 minute checks.  

## 2020-11-09 NOTE — Progress Notes (Signed)
Pt denies SI/HI/AVH but remains anxious. Patient seemed to sleep well through out the night.

## 2020-11-10 MED ORDER — MENTHOL 3 MG MT LOZG
LOZENGE | OROMUCOSAL | Status: AC
  Filled 2020-11-10: qty 9

## 2020-11-10 NOTE — Progress Notes (Signed)
Pt was calm and cooperative this shift.  Denies SI/HI/AVH.  Interacting with peers and supporting group members. RN provided support and assessed for needs and concerns.  Pt remains safe on unit with q 15 min checks in place.

## 2020-11-10 NOTE — BHH Counselor (Signed)
CSW informed pt that she received word from her HR employer that pt is not eligible for FMLA paperwork only ADA paperwork.   Fredirick Lathe, LCSWA Clinicial Social Worker [employer]

## 2020-11-10 NOTE — Progress Notes (Signed)
DAR NOTE: Patient presents with anxious affect and depressed mood.  Denies pain, auditory and visual hallucinations.  Pt complained of lack of sleep and asked to be given xanax as that the only thing that work for her. Pt was explained that she was not prescribed xanax.   Maintained on routine safety checks. Other medications given as prescribed.  Support and encouragement offered as needed.Will continue to monitor.

## 2020-11-10 NOTE — Progress Notes (Signed)
[facility]  11/10/2020 5:26 PM Deboraha SprangJennifer Joy Prosser  MRN:  [id number]   Subjective: Patient reports that she continues to experience anxiety accompanied by nausea and headache.  She denies wish for death or suicidal ideation.  She denies AI, HI.  The patient reports that her appetite is okay.  She experienced some middle insomnia last night.  She denies medication side effects.  She requests Ativan for anxiety.  Objective: Patient is in her room she is neatly groomed and wearing hospital scrubs.  She maintains good eye contact.  Speech is of normal rate volume and amount.  Mood is anxious.  Affect is congruent and constricted.  Thought processes are coherent goal-directed.  Thought content contained somatic focus.  She denies SI, AI, HI.  No paranoia, referential thinking or delusional content elicited.  The patient denies perceptual abnormalities and does not appear to respond to internal stimuli.  She is alert and oriented.  Attention and concentration are grossly intact.  Insight and judgment are fair.  I encouraged patient to participate in substance use disorder treatment after discharge.  She states that she does not need treatment for substance use because she does not have a substance use problem.  She is interested in group therapy or partial hospital program.  I advised her that I would ask the social worker to determine what might be available for her regarding PHP or group therapy outside the hospital.  We also discussed the importance of the patient using nonpharmacologic ways to reduce her baseline anxiety level and to manage anxiety such as breathing exercises, yoga, meditation, journaling, taking a bath, listening to relaxing music etc.  We also discussed sleep hygiene.  The patient requested treatment with Ativan for her anxiety.  I discussed with her that I would not be prescribing Ativan or any benzodiazepines.  She requested an increase in her gabapentin dose however she is already  taking 900 mg 3 times daily.  Per review of nursing notes, patient requested Xanax last night from nursing staff for anxiety but was advised she would not be administered Xanax because it was not prescribed.  Patient has been taking standing dose medications as prescribed.  She has been taking as needed acetaminophen for pain/headache.  Last night she took as needed trazodone for sleep and as needed hydroxyzine for anxiety.  She has not engaged in any aggressive behavior on the unit or any self-injurious behavior on the unit.  Principal Problem: Severe recurrent major depression (HCC) Diagnosis: Principal Problem:   Severe recurrent major depression (HCC)  Total Time spent with patient: 20 minutes  Past Psychiatric History: See H&P  Past Medical History:  Past Medical History:  Diagnosis Date  . Adult ADHD 05/2020  . Alcohol use disorder severe. 12/18/2014  . Anxiety   . Borderline personality disorder (HCC) 12/18/2014  . CVA (cerebral infarction)   . Depression   . 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 Opioid use disorder, severe, dependence (HCC) 12/20/2014  . PTSD (post-traumatic stress disorder) 03/28/2015  . 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 Seizures (HCC)   . Suicide attempt by hanging [facility](HCC)     Past Surgical History:  Procedure Laterality Date  . NO PAST SURGERIES     Family History:  Family History  Problem Relation Age of Onset  . Diabetes Father   . Dementia Mother   . Anxiety disorder Mother    Family Psychiatric  History:  See H&P Social History:  Social History   Substance and Sexual Activity  Alcohol Use Yes   Comment: socially     Social History   Substance and Sexual Activity  Drug Use Yes  . Types: Benzodiazepines, Hydrocodone   Comment: prescribed vivance and adderol    Social History   Socioeconomic History  . Marital status: Single    Spouse name:  Not on file  . Number of children: Not on file  . Years of education: Not on file  . Highest education level: Not on file  Occupational History  . Not on file  Tobacco Use  . Smoking status: Current Every Day Smoker    Packs/day: 0.00    Years: 20.00    Pack years: 0.00    Types: Cigarettes  . Smokeless tobacco: Never Used  . Tobacco comment: 1-2 cigarettes per day  Vaping Use  . Vaping Use: Never used  Substance and Sexual Activity  . Alcohol use: Yes    Comment: socially  . Drug use: Yes    Types: Benzodiazepines, Hydrocodone    Comment: prescribed vivance and adderol  . Sexual activity: Yes    Birth control/protection: I.U.D.  Other Topics Concern  . Not on file  Social History Narrative  . Not on file   Social Determinants of Health   Financial Resource Strain: Not on file  Food Insecurity: Not on file  Transportation Needs: Not on file  Physical Activity: Not on file  Stress: Not on file  Social Connections: Not on file   Additional Social History:                         Sleep: Fair  Appetite:  Good  Current Medications: Current Facility-Administered Medications  Medication Dose Route Frequency Provider Last Rate Last Admin  . acetaminophen (TYLENOL) tablet 650 mg  650 mg Oral Q6H PRN Nira Conn A, NP   650 mg at 11/10/20 0958  . alum & mag hydroxide-simeth (MAALOX/MYLANTA) 200-200-20 MG/5ML suspension 30 mL  30 mL Oral Q4H PRN Nira Conn A, NP      . ARIPiprazole (ABILIFY) tablet 5 mg  5 mg Oral Daily Nwoko, Agnes I, NP   5 mg at 11/10/20 0955  . feeding supplement (ENSURE ENLIVE / ENSURE PLUS) liquid 237 mL  237 mL Oral Q24H Antonieta Pert, MD   237 mL at 11/08/20 2109  . gabapentin (NEURONTIN) capsule 900 mg  900 mg Oral TID Nira Conn A, NP   900 mg at 11/10/20 1601  . hydrOXYzine (ATARAX/VISTARIL) tablet 25 mg  25 mg Oral Q6H PRN Claudie Revering, MD   25 mg at 11/10/20 0024  . magnesium hydroxide (MILK OF MAGNESIA) suspension 30 mL   30 mL Oral Daily PRN Nira Conn A, NP      . mirtazapine (REMERON) tablet 15 mg  15 mg Oral QHS Nira Conn A, NP   15 mg at 11/09/20 2128  . multivitamin with minerals tablet 1 tablet  1 tablet Oral Daily Antonieta Pert, MD   1 tablet at 11/10/20 0956  . nicotine (NICODERM CQ - dosed in mg/24 hours) patch 21 mg  21 mg Transdermal Daily Antonieta Pert, MD   21 mg at 11/10/20 0956  . nicotine polacrilex (NICORETTE) gum 2 mg  2 mg Oral PRN Nwoko, Uchenna E, PA      . ondansetron (ZOFRAN-ODT) disintegrating tablet 8 mg  8 mg Oral Q8H PRN  Antonieta Pert, MD   8 mg at 11/10/20 0959  . OXcarbazepine (TRILEPTAL) tablet 150 mg  150 mg Oral BID Claudie Revering, MD   150 mg at 11/10/20 1601  . traZODone (DESYREL) tablet 50 mg  50 mg Oral QHS PRN,MR X 1 Jaclyn Shaggy, PA-C   50 mg at 11/09/20 2127  . venlafaxine XR (EFFEXOR-XR) 24 hr capsule 300 mg  300 mg Oral Q breakfast Nira Conn A, NP   300 mg at 11/10/20 2683    Lab Results: No results found for this or any previous visit (from the past 48 hour(s)).  Blood Alcohol level:  Lab Results  Component Value Date   ETH <10 11/04/2020   ETH 160 (H) 12/29/2019    Metabolic Disorder Labs: Lab Results  Component Value Date   HGBA1C 5.6 11/06/2020   MPG 114.02 11/06/2020   MPG 119.76 04/12/2017   Lab Results  Component Value Date   PROLACTIN 98.1 (H) 12/12/2015   Lab Results  Component Value Date   CHOL 173 11/06/2020   TRIG 175 (H) 11/06/2020   HDL 39 (L) 11/06/2020   CHOLHDL 4.4 11/06/2020   VLDL 35 11/06/2020   LDLCALC 99 11/06/2020   LDLCALC 80 04/12/2017    Physical Findings: AIMS: Facial and Oral Movements Muscles of Facial Expression: None, normal Lips and Perioral Area: None, normal Jaw: None, normal Tongue: None, normal,Extremity Movements Upper (arms, wrists, hands, fingers): None, normal Lower (legs, knees, ankles, toes): None, normal, Trunk Movements Neck, shoulders, hips: None, normal, Overall  Severity Severity of abnormal movements (highest score from questions above): None, normal Incapacitation due to abnormal movements: None, normal Patient's awareness of abnormal movements (rate only patient's report): No Awareness, Dental Status Current problems with teeth and/or dentures?: No Does patient usually wear dentures?: No  CIWA:  CIWA-Ar Total: 7 COWS:     Musculoskeletal: Strength & Muscle Tone: within normal limits Gait & Station: normal Patient leans: N/A  Psychiatric Specialty Exam:  Presentation  General Appearance: Appropriate for Environment; Casual  Eye Contact:Good  Speech:Clear and Coherent; Normal Rate  Speech Volume:Normal  Handedness:Right   Mood and Affect  Mood:Anxious  Affect:Congruent   Thought Process  Thought Processes:Coherent; Goal Directed; Linear  Descriptions of Associations:Intact  Orientation:Full (Time, Place and Person)  Thought Content:Logical; Rumination  History of Schizophrenia/Schizoaffective disorder:No  Duration of Psychotic Symptoms:No data recorded Hallucinations:Hallucinations: None  Ideas of Reference:None  Suicidal Thoughts:Suicidal Thoughts: No  Homicidal Thoughts:Homicidal Thoughts: No   Sensorium  Memory:Immediate Good; Recent Good; Remote Good  Judgment:Fair  Insight:Fair   Executive Functions  Concentration:Good  Attention Span:Good  Recall:Good  Fund of Knowledge:Good  Language:Good   Psychomotor Activity  Psychomotor Activity:Psychomotor Activity: Normal   Assets  Assets:Communication Skills; Desire for Improvement; Housing; Resilience; Social Support; Vocational/Educational   Sleep  Sleep:Sleep: Fair    Physical Exam: Physical Exam Vitals and nursing note reviewed.  HENT:     Head: Normocephalic and atraumatic.  Neurological:     General: No focal deficit present.     Mental Status: She is alert.    Review of Systems  Constitutional: Negative for chills,  diaphoresis and fever.  HENT: Negative for hearing loss.   Eyes: Negative for blurred vision.  Respiratory: Negative for cough and shortness of breath.   Cardiovascular: Negative for chest pain and palpitations.  Gastrointestinal: Positive for nausea. Negative for constipation, diarrhea and vomiting.  Genitourinary: Negative for dysuria.  Musculoskeletal: Negative for myalgias.  Neurological: Positive for headaches.  Psychiatric/Behavioral: Negative for depression, hallucinations and suicidal ideas. The patient is nervous/anxious and has insomnia.    Blood pressure (!) 133/97, pulse (!) 102, temperature (!) 97.4 F (36.3 C), temperature source Oral, resp. rate 16, height 5\' 8"  (1.727 m), weight 94.3 kg, SpO2 98 %. Body mass index is 31.63 kg/m.   Treatment Plan Summary: The patient is a 45 year old female with a past psychiatric history significant for depression, anxiety, alcohol use disorder, previous suicide attempts, cocaine use disorder and reported attention deficit disorder who presented to the [facility] emergency department on 11/04/2020 with suicidal ideation in the context of poor adherence to outpatient psychiatric medications, substance use (UDS positive for cocaine, THC and amphetamines although patient was prescribed amphetamines) and increased psychosocial stressors.  Now denying suicidal ideation or wish for death.  She continues to report ongoing problems with anxiety and feels medications other than benzodiazepines are not effective in alleviating her anxiety.  Inpatient psychiatric hospitalization is needed for further treatment and stabilization of mood and anxiety symptoms and for safety of patient.  Daily contact with patient to assess and evaluate symptoms and progress in treatment and Medication management    Continue every 15-minute observation status for safety of patient. Encouraged participation in group therapy and therapeutic  milieu  Depression/mood stabilization -Continue Effexor XR 300 mg daily for depression and anxiety -Continue mirtazapine 15 mg nightly for depression -Continue Trileptal 150 mg twice daily for mood stabilization -Continue Abilify 5 mg nightly for mood stabilization and adjunctive treatment of depression  Anxiety -Continue Effexor XR 300 mg daily for anxiety and depression -Hydroxyzine 25 mg 3 times daily as needed anxiety  Insomnia -Continue trazodone 50 mg nightly as needed insomnia  Substance use disorder -Would benefit from participating in substance use disorder treatment program after discharge per patient does not want to participate.  Social work is working on 01/04/2021.  Patient would benefit from referral to substance use disorder treatment program, partial hospital program or some group therapy treatment setting if available in addition to individual therapy and outpatient psychiatry follow-up.  I certify that inpatient services furnished can reasonably be expected to improve the patient's condition.  Public house manager, MD 11/10/2020, 5:26 PM

## 2020-11-10 NOTE — [facility] Counselor (Signed)
CSW faxed letter to pt's work stating she is currently in the hospital.   Fredirick Lathe, [facility] Clinicial Social Worker [employer]

## 2020-11-10 NOTE — Progress Notes (Signed)
[facility] Group Notes:  (Nursing/MHT/Case Management/Adjunct)  Date:  11/10/2020  Time:  2015  Type of Therapy:  wrap up group  Participation Level:  Active  Participation Quality:  Appropriate, Attentive, Sharing and Supportive  Affect:  Depressed and Flat  Cognitive:  Appropriate  Insight:  Improving  Engagement in Group:  Engaged  Modes of Intervention:  Clarification, Education and Support  Summary of Progress/Problems: Positive thinking and positive change were discussed.   Marcille Buffy 11/10/2020, 9:22 PM

## 2020-11-11 MED ORDER — NICOTINE 21 MG/24HR TD PT24: 21.0000 mg | MEDICATED_PATCH | Freq: Every day | TRANSDERMAL | 0 refills | Status: DC

## 2020-11-11 MED ORDER — HYDROXYZINE HCL 25 MG PO TABS: 25.0000 mg | ORAL_TABLET | Freq: Four times a day (QID) | ORAL | 0 refills | Status: DC | PRN

## 2020-11-11 MED ORDER — GABAPENTIN 300 MG PO CAPS: 900.0000 mg | ORAL_CAPSULE | Freq: Three times a day (TID) | ORAL | 0 refills | Status: DC

## 2020-11-11 MED ORDER — TRAZODONE HCL 50 MG PO TABS: 50.0000 mg | ORAL_TABLET | Freq: Every evening | ORAL | 0 refills | Status: AC | PRN

## 2020-11-11 MED ORDER — OXCARBAZEPINE 150 MG PO TABS: 150.0000 mg | ORAL_TABLET | Freq: Two times a day (BID) | ORAL | 0 refills | Status: AC

## 2020-11-11 MED ORDER — VENLAFAXINE HCL ER 150 MG PO CP24: 300.0000 mg | ORAL_CAPSULE | Freq: Every day | ORAL | 0 refills | Status: DC

## 2020-11-11 MED ORDER — ARIPIPRAZOLE 5 MG PO TABS: 5.0000 mg | ORAL_TABLET | Freq: Every day | ORAL | 0 refills | Status: AC

## 2020-11-11 MED ORDER — ONDANSETRON 4 MG PO TBDP
8.0000 mg | ORAL_TABLET | Freq: Two times a day (BID) | ORAL | Status: DC | PRN
  Administered 2020-11-11: 8 mg via ORAL
  Filled 2020-11-11: qty 2

## 2020-11-11 MED ORDER — MIRTAZAPINE 15 MG PO TABS: 15.0000 mg | ORAL_TABLET | Freq: Every day | ORAL | 0 refills | Status: DC

## 2020-11-11 MED ORDER — NICOTINE POLACRILEX 2 MG MT GUM: 2.0000 mg | CHEWING_GUM | OROMUCOSAL | 0 refills | Status: AC | PRN

## 2020-11-11 NOTE — Plan of Care (Signed)
  Problem: Education: Goal: Knowledge of [facility] Education information/materials will improve 11/11/2020 1118 by Guadlupe Spanish, RN Outcome: Adequate for Discharge 11/11/2020 1118 by Guadlupe Spanish, RN Outcome: Progressing Goal: Emotional status will improve 11/11/2020 1118 by Guadlupe Spanish, RN Outcome: Adequate for Discharge 11/11/2020 1118 by Guadlupe Spanish, RN Outcome: Progressing Goal: Mental status will improve Outcome: Adequate for Discharge Goal: Verbalization of understanding the information provided will improve Outcome: Adequate for Discharge

## 2020-11-11 NOTE — [facility] Suicide Risk Assessment (Signed)
[facility] Discharge Suicide Risk Assessment   Principal Problem: Severe recurrent major depression (HCC) Discharge Diagnoses: Principal Problem:   Severe recurrent major depression (HCC)   Unspecified Anxiety Disorder  Total Time spent with patient: 20 minutes  Musculoskeletal: Strength & Muscle Tone: within normal limits Gait & Station: normal Patient leans: N/A  Psychiatric Specialty Exam: Review of Systems  Constitutional: Negative for diaphoresis and fever.  HENT: Negative for drooling, facial swelling, sore throat and trouble swallowing.   Eyes: Negative for discharge.  Respiratory: Negative for cough and shortness of breath.   Cardiovascular: Negative for chest pain and palpitations.  Gastrointestinal: Negative for constipation, diarrhea, nausea and vomiting.  Genitourinary: Negative for difficulty urinating.  Musculoskeletal: Negative for arthralgias.  Neurological: Positive for headaches. Negative for dizziness, tremors and light-headedness.  Psychiatric/Behavioral: Negative for hallucinations, self-injury and suicidal ideas. The patient is nervous/anxious.    Vital Signs 11/11/20 at 11:12AM BP 119/91 sitting and 125/93 standing HR 88 sitting and 85 standing RR 20 Temp 98.6  Blood pressure (!) 125/93, pulse 85, temperature 98.6 F (37 C), temperature source Oral, resp. rate 20, height 5\' 8"  (1.727 m), weight 94.3 kg, SpO2 99 %.Body mass index is 31.63 kg/m.  General Appearance: Casual  Eye Contact::  Good  Speech:  Clear and Coherent and Normal Rate  Volume:  Normal  Mood:  Anxious  Affect:  Appropriate  Thought Process:  Coherent, Disorganized and Linear  Orientation:  Full (Time, Place, and Person)  Thought Content:  Logical  Suicidal Thoughts:  No  Homicidal Thoughts:  No  Memory:  Immediate;   Good Recent;   Good Remote;   Good  Judgement:  Good  Insight:  Fair  Psychomotor Activity:  Normal  Concentration:  Good  Recall:  Good  Fund of Knowledge:Good   Language: Good  Akathisia:  No    AIMS (if indicated):     Assets:  Communication Skills Desire for Improvement Financial Resources/Insurance Housing Resilience Vocational/Educational  Sleep:  Number of Hours: 6  Cognition: WNL  ADL's:  Intact   Mental Status Per Nursing Assessment::   On Admission:  NA  Demographic Factors:  Living alone  Loss Factors: Loss of significant relationship  Historical Factors: Prior suicide attempts  Risk Reduction Factors:   Employed, Positive therapeutic relationship and Positive coping skills or problem solving skills  Continued Clinical Symptoms:  Anxiety More than one psychiatric diagnosis Previous Psychiatric Diagnoses and Treatments  Cognitive Features That Contribute To Risk:  None    Suicide Risk:  Mild:  Suicidal ideation of limited frequency, intensity, duration, and specificity.  There are no identifiable plans, no associated intent, mild dysphoria and related symptoms, good self-control (both objective and subjective assessment), few other risk factors, and identifiable protective factors, including available and accessible social support.   Follow-up Information    [facility] [phone]. Go on 12/12/2020.   Why: You have an appointment for medication management services on 12/12/20 at 1:00 pm.  This appointment will be held in person.  Contact information: [address] [place] [phone]        [phone], [facility] Follow up on 11/16/2020.   Why: You have an appointment for therapy services on 11/16/20 at 3:00 pm.  This provider also offers medication management services.  This will be a Virtual appointment. Discuss potential referral for therapy when established with this provider. Contact information: [address] [place] [phone]  Plan Of Care/Follow-up recommendations:   Activity:  as tolerated  Tests:  you will need  to have your cholesterol and blood sugar checked periodically because you are taking Abilify.  Prior to starting Abilify, your blood sugar was mildly elevated when checked during this admission (143 and 122 with normal hemoglobin a1c of 5.6).  Your HDL cholesterol and triglycerides were outside the normal range (HDL 39 and Triglycerides 175).    Other:  Take medications as prescribed.  Keep follow up appointments with therapist and outpatient psychiatrist.  Ask your therapist about referral to group therapy.  Do not drink alcohol.  Do not use marijuana or other drugs.  Participation in 12-step group and getting a sponsor would help you refrain from using alcohol or drugs.  See your primary care provider for follow up of blood sugar and cholesterol lab work, blood pressure and for any other physical issues.  Claudie Revering, MD 11/11/2020, 12:01 PM

## 2020-11-11 NOTE — Discharge Summary (Signed)
Physician Discharge Summary Note  Patient:  Sandy Mason is an 45 y.o., female MRN:  [id number] DOB:  [birth year] Patient phone:  [phone] (home)  Patient address:   [place] [address],  Total Time spent with patient: Greater than 30 minutes  Date of Admission:  11/05/2020 Date of Discharge: 11-11-20  Reason for Admission: Exacerbation of depression and anxiety with suicidal ideation.   Principal Problem: Severe recurrent major depression [facility])  Discharge Diagnoses: Patient Active Problem List   Diagnosis Date Noted  . Severe recurrent major depression ([place]) [F33.2] 11/05/2020  . Major depressive disorder, recurrent severe without psychotic features ([place]) [F33.2] 04/11/2017  . Salicylate overdose [H88.502D] 04/09/2017  . AKI (acute kidney injury) ([place]) [N17.9] 04/03/2016  . Suicidal behavior [R45.89] 01/16/2016  . Alcohol intoxication ([place]) [F10.929]   . Severe episode of recurrent major depressive disorder, without psychotic features ([place]) [F33.2]   . Malingering [Z76.5] 12/16/2015  . Major depressive disorder, recurrent episode, moderate ([place]) [F33.1] 12/12/2015  . Closed pelvic fracture ([place]) [S32.9XXA] 10/31/2015  . Tobacco abuse [Z72.0] 10/31/2015  . Tobacco use disorder [F17.200] 03/28/2015  . PTSD (post-traumatic stress disorder) [F43.10] 03/28/2015  . Stimulant use disorder (cocaine) [F15.90] 03/28/2015  . Sedative, hypnotic or anxiolytic use disorder, severe, dependence ([place]) [F13.20] 03/27/2015  . Opioid use disorder, severe, dependence ([place]) [F11.20] 12/20/2014  . Alcohol use disorder severe. [F10.20] 12/18/2014  . Borderline personality disorder ([place]) [F60.3] 12/18/2014   Past Medical History:  Past Medical History:  Diagnosis Date  . Adult ADHD 05/2020  . Alcohol use disorder severe. 12/18/2014  . Anxiety   . Borderline personality disorder ([place]) 12/18/2014  . CVA (cerebral infarction)   . Depression   . Major depressive disorder  recurrent severe without psychotic features. 12/18/2014  . Opioid use disorder, severe, dependence ([place]) 12/20/2014  . PTSD (post-traumatic stress disorder) 03/28/2015  . Sedative, hypnotic or anxiolytic use disorder, severe, dependence ([place]) 03/27/2015  . Seizures ([place])   . Suicide attempt by hanging [facility])     Past Surgical History:  Procedure Laterality Date  . NO PAST SURGERIES     Family History:  Family History  Problem Relation Age of Onset  . Diabetes Father   . Dementia Mother   . Anxiety disorder Mother    Social History:  Social History   Substance and Sexual Activity  Alcohol Use Yes   Comment: socially     Social History   Substance and Sexual Activity  Drug Use Yes  . Types: Benzodiazepines, Hydrocodone   Comment: prescribed vivance and adderol    Social History   Socioeconomic History  . Marital status: Single    Spouse name: Not on file  . Number of children: Not on file  . Years of education: Not on file  . Highest education level: Not on file  Occupational History  . Not on file  Tobacco Use  . Smoking status: Current Every Day Smoker    Packs/day: 0.00    Years: 20.00    Pack years: 0.00    Types: Cigarettes  . Smokeless tobacco: Never Used  . Tobacco comment: 1-2 cigarettes per day  Vaping Use  . Vaping Use: Never used  Substance and Sexual Activity  . Alcohol use: Yes    Comment: socially  . Drug use: Yes    Types: Benzodiazepines, Hydrocodone    Comment: prescribed vivance and adderol  . Sexual activity: Yes    Birth control/protection: I.U.D.  Other Topics Concern  .  Not on file  Social History Narrative  . Not on file   Social Determinants of Health   Financial Resource Strain: Not on file  Food Insecurity: Not on file  Transportation Needs: Not on file  Physical Activity: Not on file  Stress: Not on file  Social Connections: Not on file   Hospital Course: (Per Provider's admission evaluation notes): Nursing record reviewed,  Lab results reviewed and ER records reviewed.  Patient with long hx of depression, anxiety and multiple suicide attempted was admitted from [facility] for treatment of exacerbation of depression and anxiety with suicidal ideation.  Patient was seen this morning for admission interview.  She calm and cooperative and she stated that she came in Voluntarily due to relationship stressors and her daughter coming out as a transgender child.  She has missed days of work because she was anxious and depressed and did not want to face her co-workers.  Her last hospitalization in a Psychiatric unit was 5 years ago.  Patient reports that she has been Stable for a while until she stopped taking Rexulti.  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  Her Psychiatrist asked her to cut dose down to 1 mg which she did but continued having side effects and her PCP nurse asked her to stop taking Rexulti and come to the ER.  Patient sees Ander Slade at [facility] in [place].   Patient reports intense depression 10/10 with 10 being severe depression and anxiety.  Psychiatric hx includes Opoid use disorder, Borderline Personality disorder, ADHD on Vyvanse and Adderall.  Last  drug and Alcohol rehabilitation was 5 years ago.   Patient endorsed suicide ideation with any plan available because she will not be given Vyvanse and Adderall in the unit.  Her UDS is positive for Stimulant, Cocaine and Cannabis.  She admits she recently tried Cocaine use.  Patient reports a suicide attempt 5 years ago by hanging and for 5 years she has not tried to kill herself.  She reports poor appetite and sleep. She is unable to contract for safety at this time and is now on 1:1 observation for safety.  We have resumed home medications and will use Abilify to replace Rexulti.  We will make medication changes as needed.  Discharge plan will include helping patient engage in outpatient therapy.   Patient denies HI/AVH.  This is a psychiatric discharge summary for this 45 year old female with hx of major depressive disorder & polysubstance use disorder. She was admitted with complain of  exacerbation of depression & anxiety with suicidal ideation. Her UDS on this admission was positive for Amphetamine, Cocaine & Tricyclic. She does have hx of alcoholism as well. Chart review also indicated that she may not have been compliant with treatment regimen. She was recommended for mood stabilization treatments.   After evaluation of her presenting symptoms, it was jointly agreed by the treatment team to recommend [facility] for mood stabilization treatments. The medication regimen targeting those presenting symptoms were discussed & initiated with her consent. However, she was constantly requesting some form of medications for anxiety, ADHD & or increase in medication dosages. She was treated, stabilized & discharged on the medications as listed below on her discharge medication lists. She was also enrolled & participated in the group counseling sessions being offered & held on this unit. She learned coping skills. She presented other significant pre-existing medical issues that required treatment  &  or monitoring. She was resumed & discharged on those medications for those health issues. She tolerated her treatment regimen without any adverse effects or reactions reported.    Anoushka's symptoms responded well to her treatment regimen. This is evidenced by her reports of improved mood, resolution of symptoms, presentation of good affect/eye contact & absence of substance withdrawal symptoms. She presents currently mentally & medically stable for discharge to continue mental health care on an outpatient basis as recommended below.    Today upon her discharge evaluation with the attending psychiatrist, Kaiden shares she is doing much better. She denies any other specific concerns. She is sleeping well. Her  appetite is good. She denies any other physical complaints. She denies SI/HI/AH/VH, delusional thoughts or paranoia. She is tolerating her medications well & in agreement to continue her current regimen as recommended. She will follow up for routine psychiatric care, further substance abuse treatment & medication management as noted below. She is provided with all the necessary information needed to make these appointments without problems. She was able to engage in safety planning including plan to return to [facility] or contact emergency services if she feels unable to maintain her own safety or the safety of others. Pt had no further questions, comments or concerns. She left [facility] with all personal belongings in no apparent distress. Transportation per as scheduled by the Education officer, museum.  Physical Findings: AIMS: Facial and Oral Movements Muscles of Facial Expression: None, normal Lips and Perioral Area: None, normal Jaw: None, normal Tongue: None, normal,Extremity Movements Upper (arms, wrists, hands, fingers): None, normal Lower (legs, knees, ankles, toes): None, normal, Trunk Movements Neck, shoulders, hips: None, normal, Overall Severity Severity of abnormal movements (highest score from questions above): None, normal Incapacitation due to abnormal movements: None, normal Patient's awareness of abnormal movements (rate only patient's report): No Awareness, Dental Status Current problems with teeth and/or dentures?: No Does patient usually wear dentures?: No  CIWA:  CIWA-Ar Total: 7 COWS:     Musculoskeletal: Strength & Muscle Tone: within normal limits Gait & Station: normal Patient leans: N/A  Psychiatric Specialty Exam: Physical Exam Vitals and nursing note reviewed.  HENT:     Head: Normocephalic.     Nose: Nose normal.     Mouth/Throat:     Pharynx: Oropharynx is clear.  Eyes:     Pupils: Pupils are equal, round, and reactive to light.  Cardiovascular:     Rate and Rhythm: Normal  rate.     Pulses: Normal pulses.  Pulmonary:     Effort: Pulmonary effort is normal.  Genitourinary:    Comments: Deferred Musculoskeletal:        General: Normal range of motion.     Cervical back: Normal range of motion.  Skin:    General: Skin is warm and dry.  Neurological:     General: No focal deficit present.     Mental Status: She is alert and oriented to person, place, and time. Mental status is at baseline.  Psychiatric:        Speech: Speech normal.        Behavior: Behavior normal.        Thought Content: Thought content normal.        Judgment: Judgment is impulsive.     Review of Systems  Constitutional: Negative.   HENT: Negative.   Eyes: Negative.   Respiratory: Negative.   Cardiovascular: Negative.   Gastrointestinal: Negative.   Genitourinary: Negative.   Musculoskeletal: Negative.   Skin: Negative.  Neurological: Negative for dizziness, tingling, tremors, sensory change, speech change, focal weakness, seizures, loss of consciousness, weakness and headaches.  Endo/Heme/Allergies:       Allergies: See the allergy lists  Psychiatric/Behavioral: Positive for depression (Stabilized with medication prior to discharge)) and substance abuse (Hx. polysubstance use disorder). Negative for hallucinations, memory loss and suicidal ideas. The patient has insomnia (Hx. stabilized with medication prior to discharge). The patient is not nervous/anxious (Stable upon discharge).   All other systems reviewed and are negative.   Blood pressure (!) 133/97, pulse (!) 102, temperature (!) 97.4 F (36.3 C), temperature source Oral, resp. rate 16, height '5\' 8"'  (1.727 m), weight 94.3 kg, SpO2 98 %.Body mass index is 31.63 kg/m.  General Appearance: See Md's discharge SRA  Sleep:  Number of Hours: 6   Have you used any form of tobacco in the last 30 days? (Cigarettes, Smokeless Tobacco, Cigars, and/or Pipes): Yes  Has this patient used any form of tobacco in the last 30 days?  (Cigarettes, Smokeless Tobacco, Cigars, and/or Pipes): Yes, an FDA-approved tobacco cessation medication was offered at discharge.  Blood Alcohol level:  Lab Results  Component Value Date   ETH <10 11/04/2020   ETH 160 (H) 63/84/5364   Metabolic Disorder Labs:  Lab Results  Component Value Date   HGBA1C 5.6 11/06/2020   MPG 114.02 11/06/2020   MPG 119.76 04/12/2017   Lab Results  Component Value Date   PROLACTIN 98.1 (H) 12/12/2015   Lab Results  Component Value Date   CHOL 173 11/06/2020   TRIG 175 (H) 11/06/2020   HDL 39 (L) 11/06/2020   CHOLHDL 4.4 11/06/2020   VLDL 35 11/06/2020   LDLCALC 99 11/06/2020   LDLCALC 80 04/12/2017   See Psychiatric Specialty Exam and Suicide Risk Assessment completed by Attending Physician prior to discharge.  Discharge destination:  Home  Is patient on multiple antipsychotic therapies at discharge:  No   Has Patient had three or more failed trials of antipsychotic monotherapy by history:  No  Recommended Plan for Multiple Antipsychotic Therapies: NA  Allergies as of 11/11/2020      Reactions   Depakote [valproic Acid] Anaphylaxis, Other (See Comments)   Reaction:  Seizures    Haldol [haloperidol Lactate] Anaphylaxis, Other (See Comments)   Reaction:  Seizures    Buspar [buspirone] Other (See Comments)   "lock jaw"   Meloxicam Other (See Comments), Swelling   Ankle swelling and reflex   Keflet [cephalexin] Rash      Medication List    STOP taking these medications   amphetamine-dextroamphetamine 20 MG tablet Commonly known as: ADDERALL   buPROPion 100 MG 12 hr tablet Commonly known as: WELLBUTRIN SR   ciprofloxacin 500 MG tablet Commonly known as: CIPRO   diphenhydrAMINE 25 MG tablet Commonly known as: Benadryl Allergy   famotidine 20 MG tablet Commonly known as: PEPCID   naloxone 4 MG/0.1ML Liqd nasal spray kit Commonly known as: NARCAN   pseudoephedrine 120 MG 12 hr tablet Commonly known as: SUDAFED    triamcinolone ointment 0.5 % Commonly known as: KENALOG   Vyvanse 50 MG capsule Generic drug: lisdexamfetamine     TAKE these medications     Indication  acetaminophen 325 MG tablet Commonly known as: TYLENOL Take 325-650 mg by mouth every 6 (six) hours as needed for mild pain or moderate pain.  Indication: Fever, Pain   ARIPiprazole 5 MG tablet Commonly known as: ABILIFY Take 1 tablet (5 mg total) by mouth daily. For  mood control Start taking on: November 12, 2020  Indication: Mood control   gabapentin 300 MG capsule Commonly known as: NEURONTIN Take 3 capsules (900 mg total) by mouth 3 (three) times daily. For agitation What changed: additional instructions  Indication: Agitation   hydrOXYzine 25 MG tablet Commonly known as: ATARAX/VISTARIL Take 1 tablet (25 mg total) by mouth every 6 (six) hours as needed for anxiety. What changed:   when to take this  reasons to take this  Indication: Feeling Anxious   ibuprofen 600 MG tablet Commonly known as: ADVIL TAKE ONE TABLET BY MOUTH EVERY 8 HOURS AS NEEDED FOR MODERATE PAIN  Indication: Fever, Pain   mirtazapine 15 MG tablet Commonly known as: REMERON Take 1 tablet (15 mg total) by mouth at bedtime. For depression/sleep What changed: additional instructions  Indication: Major Depressive Disorder, Insomnia   nicotine 21 mg/24hr patch Commonly known as: NICODERM CQ - dosed in mg/24 hours Place 1 patch (21 mg total) onto the skin daily. (May buy from over the counter): For smoking cessation Start taking on: November 12, 2020  Indication: Nicotine Addiction   nicotine polacrilex 2 MG gum Commonly known as: NICORETTE Take 1 each (2 mg total) by mouth as needed. (May buy from over the counter): For smoking cessation  Indication: Nicotine Addiction   ondansetron 4 MG disintegrating tablet Commonly known as: ZOFRAN-ODT TAKE ONE TABLET BY MOUTH EVERY 8 HOURS AS NEEDED FOR NAUSEA OR VOMITING  Indication: Nausea and  Vomiting   OXcarbazepine 150 MG tablet Commonly known as: TRILEPTAL Take 1 tablet (150 mg total) by mouth 2 (two) times daily. For mood stabilization  Indication: Mood stabilization   traZODone 50 MG tablet Commonly known as: DESYREL Take 1 tablet (50 mg total) by mouth at bedtime as needed for sleep.  Indication: Trouble Sleeping   venlafaxine XR 150 MG 24 hr capsule Commonly known as: EFFEXOR-XR Take 2 capsules (300 mg total) by mouth daily with breakfast. For depression What changed: additional instructions  Indication: Major Depressive Disorder       Follow-up Information    Pllc, [facility]. Go on 12/12/2020.   Why: You have an appointment for medication management services on 12/12/20 at 1:00 pm.  This appointment will be held in person.  Contact information: [place] [address] [phone]        Sonny Dandy, LCAS Follow up on 11/16/2020.   Why: You have an appointment for therapy services on 11/16/20 at 3:00 pm.  This provider also offers medication management services.  This will be a Virtual appointment. Discuss potential referral for therapy when established with this provider. Contact information: [address] [place] [address] [phone]              Follow-up recommendations: Activity:  As tolerated Diet: As recommended by your primary care doctor. Keep all scheduled follow-up appointments as recommended.   Comments: Prescriptions given at discharge.  Patient agreeable to plan.  Given opportunity to ask questions.  Appears to feel comfortable with discharge denies any current suicidal or homicidal thought. Patient is also instructed prior to discharge to: Take all medications as prescribed by his/her mental healthcare provider. Report any adverse effects and or reactions from the medicines to his/her outpatient provider promptly. Patient has been instructed & cautioned: To not engage in  alcohol and or illegal drug use while on prescription medicines. In the event of worsening symptoms, patient is instructed to call the  crisis hotline, 911 and or go to the nearest ED for appropriate evaluation and treatment of symptoms. To follow-up with his/her primary care provider for your other medical issues, concerns and or health care needs.  Signed: Lindell Spar, NP, PMHNP-BC 11/11/2020, 10:52 AM

## 2020-11-11 NOTE — Plan of Care (Signed)
  Problem: Education: Goal: Knowledge of [facility] Education information/materials will improve Outcome: Progressing Goal: Emotional status will improve Outcome: Progressing   

## 2020-11-11 NOTE — Progress Notes (Signed)
   11/11/20 0102  Psych Admission Type (Psych Patients Only)  Admission Status Voluntary  Psychosocial Assessment  Eye Contact Brief  Facial Expression Anxious;Angry  Affect Sad;Irritable  Speech Logical/coherent  Interaction Minimal  Motor Activity Slow  Appearance/Hygiene Unremarkable  Behavior Characteristics Cooperative  Mood Anxious  Thought Process  Coherency WDL  Content Blaming others  Delusions None reported or observed  Perception WDL  Hallucination None reported or observed  Judgment Impaired  Confusion None  Danger to Self  Current suicidal ideation? Denies  Self-Injurious Behavior No self-injurious ideation or behavior indicators observed or expressed   Agreement Not to Harm Self Yes  Description of Agreement contracts for safety  Danger to Others  Danger to Others None reported or observed

## 2020-11-11 NOTE — Progress Notes (Signed)
Patient ID: Sandy Mason, female   DOB: 04-02-1976, 45 y.o.   MRN: [id number]   Discharge Note:  Patient denies SI/HI at this time. Discharge instructions, AVS, prescriptions forwarded to patient's pharmacy of choice. Patient agrees to comply with medication management, follow-up visit, and outpatient therapy. Patient questions and concerns addressed and answered. Patient discharged to home via transportation.

## 2020-11-11 NOTE — Progress Notes (Signed)
  [facility] Adult Case Management Discharge Plan :  Will you be returning to the same living situation after discharge:  Yes,  pt lives alone. At discharge, do you have transportation home?: No. Pt will need transportation arrangements. Do you have the ability to pay for your medications: Yes,  pt has active medical coverage.  Release of information consent forms completed and in the chart;  Patient's signature needed at discharge.  Patient to Follow up at:  Follow-up Information    P[facility], [facility] [employer]. Go on 12/12/2020.   Why: You have an appointment for medication management services on 12/12/20 at 1:00 pm.  This appointment will be held in person.  Contact information: [address] [place] [address] [phone]        Rockney Ghee, LCAS Follow up on 11/16/2020.   Why: You have an appointment for therapy services on 11/16/20 at 3:00 pm.  This provider also offers medication management services.  This will be a Virtual appointment. Discuss potential referral for therapy when established with this provider. Contact information: [address] [place] [address] [phone]               Next level of care provider has access to [facility] Link:no  Safety Planning and Suicide Prevention discussed: Yes,  SPE reviewed with pt. Pamphlet provided at time of discharge.  Have you used any form of tobacco in the last 30 days? (Cigarettes, Smokeless Tobacco, Cigars, and/or Pipes): Yes  Has patient been referred to the Quitline?: Yes, faxed on 11/11/20  Patient has been referred for addiction treatment: Yes  Leisa Lenz, LCSW 11/11/2020, 11:13 AM

## 2020-11-11 NOTE — Tx Team (Signed)
Interdisciplinary Treatment and Diagnostic Plan Update  11/11/2020 Time of Session: 0840 Sandy Mason MRN: [id number]  Principal Diagnosis: Severe recurrent major depression (HCC)  Secondary Diagnoses: Principal Problem:   Severe recurrent major depression (HCC)   Current Medications:  Current Facility-Administered Medications  Medication Dose Route Frequency Provider Last Rate Last Admin  . acetaminophen (TYLENOL) tablet 650 mg  650 mg Oral Q6H PRN Nira Conn A, NP   650 mg at 11/11/20 0643  . alum & mag hydroxide-simeth (MAALOX/MYLANTA) 200-200-20 MG/5ML suspension 30 mL  30 mL Oral Q4H PRN Nira Conn A, NP      . ARIPiprazole (ABILIFY) tablet 5 mg  5 mg Oral Daily Nwoko, Agnes I, NP   5 mg at 11/11/20 0820  . feeding supplement (ENSURE ENLIVE / ENSURE PLUS) liquid 237 mL  237 mL Oral Q24H Antonieta Pert, MD   237 mL at 11/08/20 2109  . gabapentin (NEURONTIN) capsule 900 mg  900 mg Oral TID Nira Conn A, NP   900 mg at 11/11/20 1130  . hydrOXYzine (ATARAX/VISTARIL) tablet 25 mg  25 mg Oral Q6H PRN Claudie Revering, MD   25 mg at 11/10/20 2040  . magnesium hydroxide (MILK OF MAGNESIA) suspension 30 mL  30 mL Oral Daily PRN Nira Conn A, NP      . mirtazapine (REMERON) tablet 15 mg  15 mg Oral QHS Nira Conn A, NP   15 mg at 11/10/20 2040  . multivitamin with minerals tablet 1 tablet  1 tablet Oral Daily Antonieta Pert, MD   1 tablet at 11/11/20 0820  . nicotine (NICODERM CQ - dosed in mg/24 hours) patch 21 mg  21 mg Transdermal Daily Antonieta Pert, MD   21 mg at 11/11/20 0820  . nicotine polacrilex (NICORETTE) gum 2 mg  2 mg Oral PRN Nwoko, Uchenna E, PA      . ondansetron (ZOFRAN-ODT) disintegrating tablet 8 mg  8 mg Oral Q12H PRN Claudie Revering, MD   8 mg at 11/11/20 0943  . OXcarbazepine (TRILEPTAL) tablet 150 mg  150 mg Oral BID Claudie Revering, MD   150 mg at 11/11/20 0102  . traZODone (DESYREL) tablet 50 mg  50 mg Oral QHS PRN,MR X 1 Jaclyn Shaggy,  PA-C   50 mg at 11/10/20 2040  . venlafaxine XR (EFFEXOR-XR) 24 hr capsule 300 mg  300 mg Oral Q breakfast Nira Conn A, NP   300 mg at 11/11/20 0820   PTA Medications: Medications Prior to Admission  Medication Sig Dispense Refill Last Dose  . acetaminophen (TYLENOL) 325 MG tablet Take 325-650 mg by mouth every 6 (six) hours as needed for mild pain or moderate pain.     Marland Kitchen amphetamine-dextroamphetamine (ADDERALL) 20 MG tablet Take 20 mg by mouth daily.     Marland Kitchen buPROPion (WELLBUTRIN SR) 100 MG 12 hr tablet Take 100 mg by mouth daily.     . ciprofloxacin (CIPRO) 500 MG tablet TAKE ONE TABLET BY MOUTH 2 TIMES A DAY FOR 7 DAYS (Patient not taking: No sig reported) 14 tablet 0   . diphenhydrAMINE (BENADRYL ALLERGY) 25 MG tablet Take 1 tablet (25 mg total) by mouth every 6 (six) hours as needed. (Patient not taking: No sig reported) 30 tablet 0   . famotidine (PEPCID) 20 MG tablet Take 1 tablet (20 mg total) by mouth 2 (two) times daily. (Patient not taking: No sig reported) 60 tablet 1   . hydrOXYzine (ATARAX/VISTARIL) 25 MG tablet  Take 25 mg by mouth 3 (three) times daily.     Marland Kitchen ibuprofen (ADVIL) 600 MG tablet TAKE ONE TABLET BY MOUTH EVERY 8 HOURS AS NEEDED FOR MODERATE PAIN (Patient not taking: No sig reported) 20 tablet 0   . naloxone (NARCAN) nasal spray 4 mg/0.1 mL Nasal spray in case of heroin overdose again 1 each 1   . ondansetron (ZOFRAN-ODT) 4 MG disintegrating tablet TAKE ONE TABLET BY MOUTH EVERY 8 HOURS AS NEEDED FOR NAUSEA OR VOMITING 12 tablet 0   . pseudoephedrine (SUDAFED) 120 MG 12 hr tablet Take 1 tablet (120 mg total) by mouth 2 (two) times daily as needed for congestion. (Patient not taking: No sig reported) 20 tablet 2   . triamcinolone ointment (KENALOG) 0.5 % Apply 1 application topically 2 (two) times daily. (Patient not taking: No sig reported) 30 g 0   . VYVANSE 50 MG capsule Take 50 mg by mouth every morning.     . [DISCONTINUED] gabapentin (NEURONTIN) 300 MG capsule Take  900 mg by mouth 3 (three) times daily.     . [DISCONTINUED] mirtazapine (REMERON) 15 MG tablet Take 15 mg by mouth at bedtime.     . [DISCONTINUED] venlafaxine XR (EFFEXOR-XR) 150 MG 24 hr capsule Take 300 mg by mouth daily with breakfast.        Patient Stressors:    Patient Strengths:    Treatment Modalities: Medication Management, Group therapy, Case management,  1 to 1 session with clinician, Psychoeducation, Recreational therapy.   Physician Treatment Plan for Primary Diagnosis: Severe recurrent major depression (HCC) Long Term Goal(s): Improvement in symptoms so as ready for discharge Improvement in symptoms so as ready for discharge   Short Term Goals: Ability to identify changes in lifestyle to reduce recurrence of condition will improve Ability to verbalize feelings will improve Ability to disclose and discuss suicidal ideas Ability to demonstrate self-control will improve Ability to identify and develop effective coping behaviors will improve Ability to maintain clinical measurements within normal limits will improve Compliance with prescribed medications will improve Ability to identify triggers associated with substance abuse/mental health issues will improve Ability to identify changes in lifestyle to reduce recurrence of condition will improve Ability to verbalize feelings will improve Ability to disclose and discuss suicidal ideas Ability to demonstrate self-control will improve Ability to identify and develop effective coping behaviors will improve Ability to maintain clinical measurements within normal limits will improve Compliance with prescribed medications will improve Ability to identify triggers associated with substance abuse/mental health issues will improve  Medication Management: Evaluate patient's response, side effects, and tolerance of medication regimen.  Therapeutic Interventions: 1 to 1 sessions, Unit Group sessions and Medication  administration.  Evaluation of Outcomes: Adequate for Discharge  Physician Treatment Plan for Secondary Diagnosis: Principal Problem:   Severe recurrent major depression (HCC)  Long Term Goal(s): Improvement in symptoms so as ready for discharge Improvement in symptoms so as ready for discharge   Short Term Goals: Ability to identify changes in lifestyle to reduce recurrence of condition will improve Ability to verbalize feelings will improve Ability to disclose and discuss suicidal ideas Ability to demonstrate self-control will improve Ability to identify and develop effective coping behaviors will improve Ability to maintain clinical measurements within normal limits will improve Compliance with prescribed medications will improve Ability to identify triggers associated with substance abuse/mental health issues will improve Ability to identify changes in lifestyle to reduce recurrence of condition will improve Ability to verbalize feelings will improve Ability  to disclose and discuss suicidal ideas Ability to demonstrate self-control will improve Ability to identify and develop effective coping behaviors will improve Ability to maintain clinical measurements within normal limits will improve Compliance with prescribed medications will improve Ability to identify triggers associated with substance abuse/mental health issues will improve     Medication Management: Evaluate patient's response, side effects, and tolerance of medication regimen.  Therapeutic Interventions: 1 to 1 sessions, Unit Group sessions and Medication administration.  Evaluation of Outcomes: Adequate for Discharge   RN Treatment Plan for Primary Diagnosis: Severe recurrent major depression (HCC) Long Term Goal(s): Knowledge of disease and therapeutic regimen to maintain health will improve  Short Term Goals: Ability to remain free from injury will improve, Ability to verbalize frustration and anger  appropriately will improve, Ability to disclose and discuss suicidal ideas, Ability to identify and develop effective coping behaviors will improve and Compliance with prescribed medications will improve  Medication Management: RN will administer medications as ordered by provider, will assess and evaluate patient's response and provide education to patient for prescribed medication. RN will report any adverse and/or side effects to prescribing provider.  Therapeutic Interventions: 1 on 1 counseling sessions, Psychoeducation, Medication administration, Evaluate responses to treatment, Monitor vital signs and CBGs as ordered, Perform/monitor CIWA, COWS, AIMS and Fall Risk screenings as ordered, Perform wound care treatments as ordered.  Evaluation of Outcomes: Adequate for Discharge   LCSW Treatment Plan for Primary Diagnosis: Severe recurrent major depression (HCC) Long Term Goal(s): Safe transition to appropriate next level of care at discharge, Engage patient in therapeutic group addressing interpersonal concerns.  Short Term Goals: Engage patient in aftercare planning with referrals and resources, Increase ability to appropriately verbalize feelings, Increase emotional regulation, Identify triggers associated with mental health/substance abuse issues and Increase skills for wellness and recovery  Therapeutic Interventions: Assess for all discharge needs, 1 to 1 time with Social worker, Explore available resources and support systems, Assess for adequacy in community support network, Educate family and significant other(s) on suicide prevention, Complete Psychosocial Assessment, Interpersonal group therapy.  Evaluation of Outcomes: Adequate for Discharge   Progress in Treatment: Attending groups: Yes. Participating in groups: Yes. Taking medication as prescribed: Yes. Toleration medication: Yes. Family/Significant other contact made: No, will contact:  declined. Patient understands  diagnosis: Yes. Discussing patient identified problems/goals with staff: Yes. Medical problems stabilized or resolved: Yes. Denies suicidal/homicidal ideation: Yes. Issues/concerns per patient self-inventory: No. Other: N/A  New problem(s) identified: No, Describe:  none noted.  New Short Term/Long Term Goal(s): No update  Patient Goals:  No update  Discharge Plan or Barriers: Pt to follow up with outpatient providers for continued medication management and outpatient therapy.  Reason for Continuation of Hospitalization: Pt adequate for discharge. Pt scheduled to discharge after 1200.  Estimated Length of Stay: Pt adequate for discharge. Pt scheduled to discharge after 1200.   Attendees: Patient: Did not attend 11/11/2020 12:00 PM  Physician: Dr. Fayrene Fearing, MD 11/11/2020 12:00 PM  Nursing:  11/11/2020 12:00 PM  RN Care Manager: 11/11/2020 12:00 PM  Social Worker: Cyril Loosen, LCSW 11/11/2020 12:00 PM  Recreational Therapist:  11/11/2020 12:00 PM  Other:  11/11/2020 12:00 PM  Other:  11/11/2020 12:00 PM  Other: 11/11/2020 12:00 PM    Scribe for Treatment Team: Leisa Lenz, LCSW 11/11/2020 12:00 PM

## 2020-11-11 NOTE — Progress Notes (Signed)
Recreation Therapy Notes  Date: 4.15.22 Time: 0930 Location: [address] Dayroom  Group Topic: Stress Management   Goal Area(s) Addresses:  Patient will actively participate in stress management techniques presented during session.  Patient will successfully identify benefit of practicing stress management post d/c.   Behavioral Response: Appropriate  Intervention: Guided exercise with ambient sound and script  Activity :Guided Imagery  LRT read a script that focused on sitting peaceful at the beach and listening to the waves as they moved in and out at the shore. Patients were to listen and follow along as script was read to fully engage in activity.  Education:  Stress Management, Discharge Planning.   Education Outcome: Acknowledges education  Clinical Observations/Feedback: Patient actively engaged in technique introduced, expressed no concerns.    Caroll Rancher, LRT/CTRS         Caroll Rancher A 11/11/2020 10:35 AM

## 2020-12-09 ENCOUNTER — Emergency Department (HOSPITAL_BASED_OUTPATIENT_CLINIC_OR_DEPARTMENT_OTHER)
Admission: EM | Admit: 2020-12-09 | Discharge: 2020-12-10 | Disposition: A | Discharge status: Other Acute Inpatient Hospital | Payer: No Typology Code available for payment source | Source: Home / Self Care | Attending: Emergency Medicine | Admitting: Emergency Medicine

## 2020-12-09 ENCOUNTER — Encounter: Payer: Self-pay | Admitting: Intensive Care

## 2020-12-09 ENCOUNTER — Other Ambulatory Visit: Payer: Self-pay

## 2020-12-09 DIAGNOSIS — Z765 Malingerer [conscious simulation]: Secondary | ICD-10-CM

## 2020-12-09 DIAGNOSIS — F332 Major depressive disorder, recurrent severe without psychotic features: Secondary | ICD-10-CM | POA: Diagnosis present

## 2020-12-09 DIAGNOSIS — F603 Borderline personality disorder: Secondary | ICD-10-CM | POA: Diagnosis present

## 2020-12-09 DIAGNOSIS — R4182 Altered mental status, unspecified: Secondary | ICD-10-CM

## 2020-12-09 DIAGNOSIS — F331 Major depressive disorder, recurrent, moderate: Secondary | ICD-10-CM | POA: Diagnosis present

## 2020-12-09 DIAGNOSIS — F4310 Post-traumatic stress disorder, unspecified: Secondary | ICD-10-CM | POA: Diagnosis present

## 2020-12-09 DIAGNOSIS — Z720 Tobacco use: Secondary | ICD-10-CM | POA: Diagnosis present

## 2020-12-09 LAB — COMPREHENSIVE METABOLIC PANEL
ALT: 53 U/L — ABNORMAL HIGH (ref 0–44)
AST: 201 U/L — ABNORMAL HIGH (ref 15–41)
Albumin: 4.3 g/dL (ref 3.5–5.0)
Alkaline Phosphatase: 76 U/L (ref 38–126)
Anion gap: 9 (ref 5–15)
BUN: 18 mg/dL (ref 6–20)
CO2: 23 mmol/L (ref 22–32)
Calcium: 9.3 mg/dL (ref 8.9–10.3)
Chloride: 107 mmol/L (ref 98–111)
Creatinine, Ser: 0.89 mg/dL (ref 0.44–1.00)
GFR, Estimated: >60 mL/min (ref >60)
Glucose, Bld: 107 mg/dL — ABNORMAL HIGH (ref 70–99)
Potassium: 3.9 mmol/L (ref 3.5–5.1)
Sodium: 139 mmol/L (ref 135–145)
Total Bilirubin: 0.8 mg/dL (ref 0.3–1.2)
Total Protein: 8.1 g/dL (ref 6.5–8.1)

## 2020-12-09 LAB — CBC
HCT: 39.8 % (ref 36.0–46.0)
Hemoglobin: 12.8 g/dL (ref 12.0–15.0)
MCH: 28.8 pg (ref 26.0–34.0)
MCHC: 32.2 g/dL (ref 30.0–36.0)
MCV: 89.4 fL (ref 80.0–100.0)
Platelets: 352 10*3/uL (ref 150–400)
RBC: 4.45 MIL/uL (ref 3.87–5.11)
RDW: 14.6 % (ref 11.5–15.5)
WBC: 12.4 10*3/uL — ABNORMAL HIGH (ref 4.0–10.5)
nRBC: 0 % (ref 0.0–0.2)

## 2020-12-09 LAB — RESP PANEL BY RT-PCR (FLU A&B, COVID) ARPGX2
Influenza A by PCR: NEGATIVE
Influenza B by PCR: NEGATIVE
SARS Coronavirus 2 by RT PCR: NEGATIVE

## 2020-12-09 LAB — URINE DRUG SCREEN, QUALITATIVE (ARMC ONLY)
Amphetamines, Ur Screen: NOT DETECTED
Barbiturates, Ur Screen: NOT DETECTED
Benzodiazepine, Ur Scrn: POSITIVE — AB
Cannabinoid 50 Ng, Ur ~~LOC~~: POSITIVE — AB
Cocaine Metabolite,Ur ~~LOC~~: NOT DETECTED
MDMA (Ecstasy)Ur Screen: NOT DETECTED
Methadone Scn, Ur: POSITIVE — AB
Opiate, Ur Screen: NOT DETECTED
Phencyclidine (PCP) Ur S: NOT DETECTED
Tricyclic, Ur Screen: POSITIVE — AB

## 2020-12-09 LAB — ETHANOL: Alcohol, Ethyl (B): <10 mg/dL (ref <10)

## 2020-12-09 LAB — SALICYLATE LEVEL: Salicylate Lvl: <7 mg/dL — ABNORMAL LOW (ref 7.0–30.0)

## 2020-12-09 LAB — ACETAMINOPHEN LEVEL: Acetaminophen (Tylenol), Serum: <10 ug/mL — ABNORMAL LOW (ref 10–30)

## 2020-12-09 LAB — POC URINE PREG, ED: Preg Test, Ur: NEGATIVE

## 2020-12-09 MED ORDER — VENLAFAXINE HCL ER 75 MG PO CP24
300.0000 mg | ORAL_CAPSULE | Freq: Every day | ORAL | Status: DC
Start: 2020-12-10 — End: 2020-12-10
  Administered 2020-12-10: 300 mg via ORAL
  Filled 2020-12-09: qty 4
  Filled 2020-12-09: qty 2

## 2020-12-09 MED ORDER — TRAZODONE HCL 50 MG PO TABS: 50.0000 mg | ORAL_TABLET | Freq: Every evening | ORAL | Status: DC | PRN

## 2020-12-09 MED ORDER — GABAPENTIN 300 MG PO CAPS
900.0000 mg | ORAL_CAPSULE | Freq: Three times a day (TID) | ORAL | Status: DC
  Administered 2020-12-09 – 2020-12-10 (×2): 900 mg via ORAL
  Filled 2020-12-09 (×2): qty 3

## 2020-12-09 MED ORDER — MIRTAZAPINE 15 MG PO TABS
15.0000 mg | ORAL_TABLET | Freq: Every day | ORAL | Status: DC
  Administered 2020-12-09: 15 mg via ORAL
  Filled 2020-12-09: qty 1

## 2020-12-09 MED ORDER — LORAZEPAM 2 MG/ML IJ SOLN: 0.0000 mg | Freq: Two times a day (BID) | INTRAMUSCULAR | Status: DC

## 2020-12-09 MED ORDER — HYDROXYZINE HCL 25 MG PO TABS
25.0000 mg | ORAL_TABLET | Freq: Four times a day (QID) | ORAL | Status: DC | PRN
Start: 2020-12-09 — End: 2020-12-10

## 2020-12-09 MED ORDER — NICOTINE 21 MG/24HR TD PT24
21.0000 mg | MEDICATED_PATCH | Freq: Every day | TRANSDERMAL | Status: DC
Start: 2020-12-09 — End: 2020-12-10
  Administered 2020-12-10: 21 mg via TRANSDERMAL
  Filled 2020-12-09: qty 1

## 2020-12-09 MED ORDER — THIAMINE HCL 100 MG PO TABS
100.0000 mg | ORAL_TABLET | Freq: Every day | ORAL | Status: DC
  Administered 2020-12-09 – 2020-12-10 (×2): 100 mg via ORAL
  Filled 2020-12-09 (×2): qty 1

## 2020-12-09 MED ORDER — LORAZEPAM 2 MG PO TABS: 0.0000 mg | ORAL_TABLET | Freq: Two times a day (BID) | ORAL | Status: DC

## 2020-12-09 MED ORDER — THIAMINE HCL 100 MG/ML IJ SOLN
100.0000 mg | Freq: Every day | INTRAMUSCULAR | Status: DC
  Filled 2020-12-09: qty 1

## 2020-12-09 MED ORDER — LORAZEPAM 2 MG PO TABS
0.0000 mg | ORAL_TABLET | Freq: Four times a day (QID) | ORAL | Status: DC
  Administered 2020-12-10: 2 mg via ORAL
  Filled 2020-12-09: qty 1

## 2020-12-09 MED ORDER — ONDANSETRON 4 MG PO TBDP
4.0000 mg | ORAL_TABLET | Freq: Three times a day (TID) | ORAL | Status: DC | PRN
  Filled 2020-12-09: qty 1

## 2020-12-09 MED ORDER — TETANUS-DIPHTH-ACELL PERTUSSIS 5-2.5-18.5 LF-MCG/0.5 IM SUSY
0.5000 mL | PREFILLED_SYRINGE | Freq: Once | INTRAMUSCULAR | Status: AC
  Administered 2020-12-09: 0.5 mL via INTRAMUSCULAR
  Filled 2020-12-09: qty 0.5

## 2020-12-09 MED ORDER — ARIPIPRAZOLE 5 MG PO TABS
5.0000 mg | ORAL_TABLET | Freq: Every day | ORAL | Status: DC
  Administered 2020-12-09 – 2020-12-10 (×2): 5 mg via ORAL
  Filled 2020-12-09 (×3): qty 1

## 2020-12-09 MED ORDER — LORAZEPAM 2 MG/ML IJ SOLN: 0.0000 mg | Freq: Four times a day (QID) | INTRAMUSCULAR | Status: DC

## 2020-12-09 MED ORDER — OXCARBAZEPINE 150 MG PO TABS
150.0000 mg | ORAL_TABLET | Freq: Two times a day (BID) | ORAL | Status: DC
  Administered 2020-12-09: 150 mg via ORAL
  Filled 2020-12-09 (×4): qty 1

## 2020-12-09 MED ORDER — LORAZEPAM 1 MG PO TABS
1.0000 mg | ORAL_TABLET | Freq: Once | ORAL | Status: AC
  Administered 2020-12-09: 1 mg via ORAL
  Filled 2020-12-09: qty 1

## 2020-12-09 NOTE — ED Provider Notes (Signed)
[facility] Emergency Department Provider Note  ____________________________________________  Time seen: Approximately 8:19 PM  I have reviewed the triage vital signs and the nursing notes.   HISTORY  Chief Complaint Altered mental status Level 5 Caveat: Portions of the History and Physical including HPI and review of systems are unable to be completely obtained due to patient being a poor historian    HPI Sandy Mason is a 45 y.o. female with a history of polysubstance abuse, depression, borderline personality disorder is brought to the ED by a friend due to altered mental status.  Patient remembers  being told yesterday afternoon by her roommate that she would have to move out of the roommates house.  Timeline is difficult to establish, but she then reports later that evening drinking alcohol and "playing hide and seek in the woods" with some teenager friends of hers.  She does not think she took extra of her Ativan, but now notes that it is all gone and she thinks it was stolen.  She had 90 tablets filled just a few days ago.  Also reports some hallucinations, mostly visual.  No agitation or flushing, no chest pain or shortness of breath, no SI or HI.  She has not taken any of her medication today.   Past Medical History:  Diagnosis Date  . Adult ADHD 05/2020  . Alcohol use disorder severe. 12/18/2014  . Anxiety   . Borderline personality disorder (HCC) 12/18/2014  . CVA (cerebral infarction)   . Depression   . 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 Opioid use disorder, severe, dependence (HCC) 12/20/2014  . PTSD (post-traumatic stress disorder) 03/28/2015  . 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 Seizures (HCC)   . Suicide attempt by hanging [facility])      Patient Active Problem List   Diagnosis Date Noted  . Severe recurrent major depression (HCC) 11/05/2020   . Major depressive disorder, recurrent severe without psychotic features (HCC) 04/11/2017  . Salicylate overdose 04/09/2017  . AKI (acute kidney injury) (HCC) 04/03/2016  . Suicidal behavior 01/16/2016  . Alcohol intoxication (HCC)   . Severe episode of recurrent major depressive disorder, without psychotic features (HCC)   . Malingering 12/16/2015  . Major depressive disorder, recurrent episode, moderate (HCC) 12/12/2015  . Closed pelvic fracture (HCC) 10/31/2015  . Tobacco abuse 10/31/2015  . Tobacco use disorder 03/28/2015  . PTSD (post-traumatic stress disorder) 03/28/2015  . Stimulant use disorder (cocaine) 03/28/2015  . 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 Opioid use disorder, severe, dependence (HCC) 12/20/2014  . Alcohol use disorder severe. 12/18/2014  . Borderline personality disorder (HCC) 12/18/2014     Past Surgical History:  Procedure Laterality Date  . NO PAST SURGERIES       Prior to Admission medications   Medication Sig Start Date End Date Taking? Authorizing Provider  acetaminophen (TYLENOL) 325 MG tablet Take 325-650 mg by mouth every 6 (six) hours as needed for mild pain or moderate pain.    [provider]  ARIPiprazole (ABILIFY) 5 MG tablet Take 1 tablet (5 mg total) by mouth daily. For mood control 11/12/20   Armandina Stammer I, NP  gabapentin (NEURONTIN) 300 MG capsule Take 3 capsules (900 mg total) by mouth 3 (three) times daily. For agitation 11/11/20   Armandina Stammer I, NP  hydrOXYzine (ATARAX/VISTARIL) 25 MG tablet Take 1 tablet (25 mg total) by mouth every 6 (  six) hours as needed for anxiety. 11/11/20   Armandina StammerNwoko, Agnes I, NP  ibuprofen (ADVIL) 600 MG tablet TAKE ONE TABLET BY MOUTH EVERY 8 HOURS AS NEEDED FOR MODERATE PAIN Patient not taking: No sig reported 09/22/20 09/22/21  Shaune PollackIsaacs, Cameron, MD  mirtazapine (REMERON) 15 MG tablet Take 1 tablet (15 mg total) by mouth at bedtime. For depression/sleep 11/11/20    Armandina StammerNwoko, Agnes I, NP  nicotine (NICODERM CQ - DOSED IN MG/24 HOURS) 21 mg/24hr patch Place 1 patch (21 mg total) onto the skin daily. (May buy from over the counter): For smoking cessation 11/12/20   Armandina StammerNwoko, Agnes I, NP  nicotine polacrilex (NICORETTE) 2 MG gum Take 1 each (2 mg total) by mouth as needed. (May buy from over the counter): For smoking cessation 11/11/20   Armandina StammerNwoko, Agnes I, NP  ondansetron (ZOFRAN-ODT) 4 MG disintegrating tablet TAKE ONE TABLET BY MOUTH EVERY 8 HOURS AS NEEDED FOR NAUSEA OR VOMITING 09/22/20 09/22/21  Shaune PollackIsaacs, Cameron, MD  OXcarbazepine (TRILEPTAL) 150 MG tablet Take 1 tablet (150 mg total) by mouth 2 (two) times daily. For mood stabilization 11/11/20   Armandina StammerNwoko, Agnes I, NP  traZODone (DESYREL) 50 MG tablet Take 1 tablet (50 mg total) by mouth at bedtime as needed for sleep. 11/11/20   Armandina StammerNwoko, Agnes I, NP  venlafaxine XR (EFFEXOR-XR) 150 MG 24 hr capsule Take 2 capsules (300 mg total) by mouth daily with breakfast. For depression 11/11/20   Armandina StammerNwoko, Agnes I, NP     Allergies Depakote [valproic acid], Haldol [haloperidol lactate], Buspar [buspirone], Meloxicam, and Keflet [cephalexin]   Family History  Problem Relation Age of Onset  . Diabetes Father   . Dementia Mother   . Anxiety disorder Mother     Social History Social History   Tobacco Use  . Smoking status: Current Every Day Smoker    Packs/day: 0.00    Years: 20.00    Pack years: 0.00    Types: Cigarettes  . Smokeless tobacco: Never Used  . Tobacco comment: 1-2 cigarettes per day  Vaping Use  . Vaping Use: Never used  Substance Use Topics  . Alcohol use: Yes    Comment: socially  . Drug use: Yes    Types: Benzodiazepines, Hydrocodone, Cocaine, Marijuana, Methamphetamines    Comment: prescribed vivance and adderol    Review of Systems  Constitutional:   No fever or chills.  ENT:   No sore throat. No rhinorrhea. Cardiovascular:   No chest pain or syncope. Respiratory:   No dyspnea or  cough. Gastrointestinal:   Negative for abdominal pain, vomiting and diarrhea.  Musculoskeletal:   Negative for focal pain or swelling All other systems reviewed and are negative except as documented above in ROS and HPI.  ____________________________________________   PHYSICAL EXAM:  VITAL SIGNS: ED Triage Vitals  Enc Vitals Group     BP 12/09/20 1643 103/65     Pulse Rate 12/09/20 1643 80     Resp 12/09/20 1643 16     Temp 12/09/20 1643 98.4 F (36.9 C)     Temp Source 12/09/20 1643 Oral     SpO2 12/09/20 1643 98 %     Weight 12/09/20 1644 210 lb (95.3 kg)     Height 12/09/20 1644 5\' 8"  (1.727 m)     Head Circumference --      Peak Flow --      Pain Score 12/09/20 1643 7     Pain Loc --      Pain Edu? --  Excl. in GC? --     Vital signs reviewed, nursing assessments reviewed.   Constitutional:   Alert and oriented. Non-toxic appearance. Eyes:   Conjunctivae are normal. EOMI. PERRL.  No corneal abrasion ENT      Head:   Normocephalic and atraumatic.      Nose:   Wearing a mask.      Mouth/Throat:   Wearing a mask.      Neck:   No meningismus. Full ROM. Hematological/Lymphatic/Immunilogical:   No cervical lymphadenopathy. Cardiovascular:   RRR. Symmetric bilateral radial and DP pulses.  No murmurs. Cap refill less than 2 seconds. Respiratory:   Normal respiratory effort without tachypnea/retractions. Breath sounds are clear and equal bilaterally. No wheezes/rales/rhonchi. Gastrointestinal:   Soft and nontender. Non distended. There is no CVA tenderness.  No rebound, rigidity, or guarding. Genitourinary:   deferred Musculoskeletal:   Normal range of motion in all extremities. No joint effusions.  No lower extremity tenderness.  No edema. Neurologic:   Normal speech and language.  Motor grossly intact. No acute focal neurologic deficits are appreciated.  Skin:    Skin is warm, dry with innumerable superficial linear scratches on the face and bilateral arms, not  patterned, does not appear to be intentional.  No rash noted.  No petechiae, purpura, or bullae.  ____________________________________________    LABS (pertinent positives/negatives) (all labs ordered are listed, but only abnormal results are displayed) Labs Reviewed  COMPREHENSIVE METABOLIC PANEL - Abnormal; Notable for the following components:      Result Value   Glucose, Bld 107 (*)    AST 201 (*)    ALT 53 (*)    All other components within normal limits  SALICYLATE LEVEL - Abnormal; Notable for the following components:   Salicylate Lvl <7.0 (*)    All other components within normal limits  ACETAMINOPHEN LEVEL - Abnormal; Notable for the following components:   Acetaminophen (Tylenol), Serum <10 (*)    All other components within normal limits  CBC - Abnormal; Notable for the following components:   WBC 12.4 (*)    All other components within normal limits  RESP PANEL BY RT-PCR (FLU A&B, COVID) ARPGX2  ETHANOL  URINE DRUG SCREEN, QUALITATIVE ([facility] ONLY)  POC URINE PREG, ED   ____________________________________________   EKG    ____________________________________________    RADIOLOGY  No results found.  ____________________________________________   PROCEDURES Procedures  ____________________________________________    CLINICAL IMPRESSION / ASSESSMENT AND PLAN / ED COURSE  Medications ordered in the ED: Medications  Tdap (BOOSTRIX) injection 0.5 mL (has no administration in time range)  ARIPiprazole (ABILIFY) tablet 5 mg (has no administration in time range)  gabapentin (NEURONTIN) capsule 900 mg (has no administration in time range)  hydrOXYzine (ATARAX/VISTARIL) tablet 25 mg (has no administration in time range)  mirtazapine (REMERON) tablet 15 mg (has no administration in time range)  nicotine (NICODERM CQ - dosed in mg/24 hours) patch 21 mg (has no administration in time range)  ondansetron (ZOFRAN-ODT) disintegrating tablet 4 mg (has no  administration in time range)  OXcarbazepine (TRILEPTAL) tablet 150 mg (has no administration in time range)  traZODone (DESYREL) tablet 50 mg (has no administration in time range)  venlafaxine XR (EFFEXOR-XR) 24 hr capsule 300 mg (has no administration in time range)  LORazepam (ATIVAN) tablet 1 mg (has no administration in time range)  LORazepam (ATIVAN) injection 0-4 mg (has no administration in time range)    Or  LORazepam (ATIVAN) tablet 0-4 mg (has no  administration in time range)  LORazepam (ATIVAN) injection 0-4 mg (has no administration in time range)    Or  LORazepam (ATIVAN) tablet 0-4 mg (has no administration in time range)  thiamine tablet 100 mg (has no administration in time range)    Or  thiamine (B-1) injection 100 mg (has no administration in time range)    Pertinent labs & imaging results that were available during my care of the patient were reviewed by me and considered in my medical decision making (see chart for details).  Marsela Kuan was evaluated in Emergency Department on 12/09/2020 for the symptoms described in the history of present illness. She was evaluated in the context of the global COVID-19 pandemic, which necessitated consideration that the patient might be at risk for infection with the SARS-CoV-2 virus that causes COVID-19. Institutional protocols and algorithms that pertain to the evaluation of patients at risk for COVID-19 are in a state of rapid change based on information released by regulatory bodies including the CDC and federal and state organizations. These policies and algorithms were followed during the patient's care in the ED.   Patient presents with memory loss of last night.  Speech somewhat slow.  I suspect this is residual intoxication from abusing alcohol and Ativan last night, with some amnesia to the events as a side effect.  She has significant psychiatric history, but not acutely psychotic now.  Not committable.  Vitals are  normal.  No acute medical complaints.  Will give tetanus vaccine for abrasions.  She is medically stable.  Will plan to give her home medications, observe overnight for psychiatry evaluation tomorrow to ensure she is stabilized.  With her chronic benzodiazepine use, will monitor with CIWA score.  The patient has been placed in psychiatric observation due to the need to provide a safe environment for the patient while obtaining psychiatric consultation and evaluation, as well as ongoing medical and medication management to treat the patient's condition.  The patient has not been placed under full IVC at this time.       ____________________________________________   FINAL CLINICAL IMPRESSION(S) / ED DIAGNOSES    Final diagnoses:  Altered mental status, unspecified altered mental status type     ED Discharge Orders    None      Portions of this note were generated with dragon dictation software. Dictation errors may occur despite best attempts at proofreading.   Sharman Cheek, MD 12/09/20 2027
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
## 2020-12-09 NOTE — ED Notes (Signed)
Pt. Transferred to [facility] from ED to room [facility] after screening for contraband. Report to include Situation, Background, Assessment and Recommendations from [employer]. Pt. Oriented to unit including Q15 minute rounds as well as the security cameras for their protection. Patient is alert and oriented, warm and dry in no acute distress. Patient denies SI, HI, and AVH. Pt. Encouraged to let me know if needs arise.
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
## 2020-12-09 NOTE — ED Notes (Signed)
Pt given sandwich tray 

## 2020-12-09 NOTE — ED Notes (Signed)
Snack and beverage given. 

## 2020-12-09 NOTE — ED Provider Notes (Signed)
Procedures     ----------------------------------------- 10:55 PM on 12/09/2020 ----------------------------------------- Pt seen by psychiatry, who recommends inpatient psych. Treatment.     Sharman Cheek, MD 12/09/20 2255

## 2020-12-10 ENCOUNTER — Inpatient Hospital Stay
Admit: 2020-12-10 | Discharge: 2020-12-13 | DRG: 885 | Disposition: A | Discharge status: Home / Self Care | Payer: No Typology Code available for payment source | Attending: Behavioral Health | Admitting: Behavioral Health

## 2020-12-10 ENCOUNTER — Encounter: Payer: Self-pay | Admitting: Psychiatry

## 2020-12-10 DIAGNOSIS — F410 Panic disorder [episodic paroxysmal anxiety] without agoraphobia: Secondary | ICD-10-CM | POA: Diagnosis present

## 2020-12-10 DIAGNOSIS — F603 Borderline personality disorder: Secondary | ICD-10-CM | POA: Diagnosis present

## 2020-12-10 DIAGNOSIS — Z8673 Personal history of transient ischemic attack (TIA), and cerebral infarction without residual deficits: Secondary | ICD-10-CM | POA: Diagnosis not present

## 2020-12-10 DIAGNOSIS — F332 Major depressive disorder, recurrent severe without psychotic features: Secondary | ICD-10-CM

## 2020-12-10 DIAGNOSIS — F909 Attention-deficit hyperactivity disorder, unspecified type: Secondary | ICD-10-CM | POA: Diagnosis present

## 2020-12-10 DIAGNOSIS — F4310 Post-traumatic stress disorder, unspecified: Secondary | ICD-10-CM | POA: Diagnosis present

## 2020-12-10 DIAGNOSIS — Z20822 Contact with and (suspected) exposure to covid-19: Secondary | ICD-10-CM | POA: Diagnosis present

## 2020-12-10 DIAGNOSIS — F1310 Sedative, hypnotic or anxiolytic abuse, uncomplicated: Secondary | ICD-10-CM | POA: Diagnosis present

## 2020-12-10 DIAGNOSIS — Z5900 Homelessness unspecified: Secondary | ICD-10-CM | POA: Diagnosis not present

## 2020-12-10 DIAGNOSIS — F17210 Nicotine dependence, cigarettes, uncomplicated: Secondary | ICD-10-CM | POA: Diagnosis present

## 2020-12-10 DIAGNOSIS — G4700 Insomnia, unspecified: Secondary | ICD-10-CM | POA: Diagnosis present

## 2020-12-10 DIAGNOSIS — Z23 Encounter for immunization: Secondary | ICD-10-CM

## 2020-12-10 DIAGNOSIS — F411 Generalized anxiety disorder: Secondary | ICD-10-CM | POA: Diagnosis present

## 2020-12-10 DIAGNOSIS — Z765 Malingerer [conscious simulation]: Secondary | ICD-10-CM | POA: Diagnosis not present

## 2020-12-10 DIAGNOSIS — G250 Essential tremor: Secondary | ICD-10-CM | POA: Diagnosis present

## 2020-12-10 DIAGNOSIS — Z9151 Personal history of suicidal behavior: Secondary | ICD-10-CM | POA: Diagnosis not present

## 2020-12-10 DIAGNOSIS — R45851 Suicidal ideations: Secondary | ICD-10-CM | POA: Diagnosis present

## 2020-12-10 MED ORDER — ONDANSETRON 4 MG PO TBDP
4.0000 mg | ORAL_TABLET | Freq: Three times a day (TID) | ORAL | Status: DC | PRN
  Administered 2020-12-11: 4 mg via ORAL
  Filled 2020-12-10: qty 1

## 2020-12-10 MED ORDER — TRAZODONE HCL 50 MG PO TABS
50.0000 mg | ORAL_TABLET | Freq: Every evening | ORAL | Status: DC | PRN
  Administered 2020-12-11 – 2020-12-12 (×2): 50 mg via ORAL
  Filled 2020-12-10 (×3): qty 1

## 2020-12-10 MED ORDER — ACETAMINOPHEN 325 MG PO TABS
650.0000 mg | ORAL_TABLET | Freq: Four times a day (QID) | ORAL | Status: DC | PRN
  Administered 2020-12-10 – 2020-12-13 (×8): 650 mg via ORAL
  Filled 2020-12-10 (×8): qty 2

## 2020-12-10 MED ORDER — LORAZEPAM 1 MG PO TABS
1.0000 mg | ORAL_TABLET | Freq: Two times a day (BID) | ORAL | Status: DC | PRN
  Administered 2020-12-10 – 2020-12-13 (×7): 1 mg via ORAL
  Filled 2020-12-10 (×8): qty 1

## 2020-12-10 MED ORDER — VENLAFAXINE HCL ER 75 MG PO CP24
300.0000 mg | ORAL_CAPSULE | Freq: Every day | ORAL | Status: DC
  Administered 2020-12-11 – 2020-12-13 (×3): 300 mg via ORAL
  Filled 2020-12-10 (×3): qty 4

## 2020-12-10 MED ORDER — BACITRACIN-NEOMYCIN-POLYMYXIN OINTMENT TUBE
TOPICAL_OINTMENT | Freq: Two times a day (BID) | CUTANEOUS | Status: DC
  Filled 2020-12-10 (×2): qty 14.17

## 2020-12-10 MED ORDER — ADULT MULTIVITAMIN W/MINERALS CH
1.0000 | ORAL_TABLET | Freq: Every day | ORAL | Status: DC
  Administered 2020-12-10 – 2020-12-13 (×4): 1 via ORAL
  Filled 2020-12-10 (×4): qty 1

## 2020-12-10 MED ORDER — LORAZEPAM 2 MG/ML IJ SOLN
1.0000 mg | INTRAMUSCULAR | Status: DC | PRN
Start: 2020-12-10 — End: 2020-12-12

## 2020-12-10 MED ORDER — MIRTAZAPINE 15 MG PO TABS
15.0000 mg | ORAL_TABLET | Freq: Every day | ORAL | Status: DC
  Administered 2020-12-10 – 2020-12-12 (×3): 15 mg via ORAL
  Filled 2020-12-10 (×3): qty 1

## 2020-12-10 MED ORDER — OXCARBAZEPINE 150 MG PO TABS
150.0000 mg | ORAL_TABLET | Freq: Two times a day (BID) | ORAL | Status: DC
  Administered 2020-12-10 – 2020-12-13 (×6): 150 mg via ORAL
  Filled 2020-12-10 (×7): qty 1

## 2020-12-10 MED ORDER — HYDROXYZINE HCL 25 MG PO TABS
25.0000 mg | ORAL_TABLET | Freq: Four times a day (QID) | ORAL | Status: DC | PRN
Start: 2020-12-10 — End: 2020-12-13
  Administered 2020-12-11 – 2020-12-12 (×4): 25 mg via ORAL
  Filled 2020-12-10 (×5): qty 1

## 2020-12-10 MED ORDER — LORAZEPAM 1 MG PO TABS
1.0000 mg | ORAL_TABLET | ORAL | Status: DC | PRN
  Administered 2020-12-11: 1 mg via ORAL
  Administered 2020-12-11: 2 mg via ORAL
  Filled 2020-12-10: qty 2
  Filled 2020-12-10: qty 1

## 2020-12-10 MED ORDER — ALUM & MAG HYDROXIDE-SIMETH 200-200-20 MG/5ML PO SUSP: 30.0000 mL | ORAL | Status: DC | PRN

## 2020-12-10 MED ORDER — MAGNESIUM HYDROXIDE 400 MG/5ML PO SUSP: 30.0000 mL | Freq: Every day | ORAL | Status: DC | PRN

## 2020-12-10 MED ORDER — ARIPIPRAZOLE 5 MG PO TABS
5.0000 mg | ORAL_TABLET | Freq: Every day | ORAL | Status: DC
  Administered 2020-12-11 – 2020-12-13 (×3): 5 mg via ORAL
  Filled 2020-12-10 (×3): qty 1

## 2020-12-10 MED ORDER — THIAMINE HCL 100 MG/ML IJ SOLN
100.0000 mg | Freq: Every day | INTRAMUSCULAR | Status: DC
  Filled 2020-12-10 (×2): qty 2

## 2020-12-10 MED ORDER — FOLIC ACID 1 MG PO TABS
1.0000 mg | ORAL_TABLET | Freq: Every day | ORAL | Status: DC
  Administered 2020-12-10 – 2020-12-13 (×4): 1 mg via ORAL
  Filled 2020-12-10 (×4): qty 1

## 2020-12-10 MED ORDER — THIAMINE HCL 100 MG PO TABS
100.0000 mg | ORAL_TABLET | Freq: Every day | ORAL | Status: DC
  Administered 2020-12-10 – 2020-12-13 (×4): 100 mg via ORAL
  Filled 2020-12-10 (×4): qty 1

## 2020-12-10 MED ORDER — GABAPENTIN 300 MG PO CAPS
900.0000 mg | ORAL_CAPSULE | Freq: Three times a day (TID) | ORAL | Status: DC
  Administered 2020-12-10 – 2020-12-13 (×9): 900 mg via ORAL
  Filled 2020-12-10 (×9): qty 3

## 2020-12-10 MED ORDER — NICOTINE 21 MG/24HR TD PT24
21.0000 mg | MEDICATED_PATCH | Freq: Every day | TRANSDERMAL | Status: DC
  Administered 2020-12-11 – 2020-12-12 (×2): 21 mg via TRANSDERMAL
  Filled 2020-12-10 (×2): qty 1

## 2020-12-10 NOTE — ED Notes (Signed)
Pt given lunch tray. Pt sleeping, tray placed on bedside chair.

## 2020-12-10 NOTE — BHH Counselor (Signed)
CSW attempted to completed patients assessment. Patient stated she was half asleep and would like to do it another time.

## 2020-12-10 NOTE — ED Notes (Signed)
Pt given breakfast tray

## 2020-12-10 NOTE — ED Notes (Signed)
Hourly rounding reveals patient in room. No complaints, stable, in no acute distress. Q15 minute rounds and monitoring via Security Cameras to continue. 

## 2020-12-10 NOTE — ED Notes (Signed)
Report given to receiving nurse. Patient awaiting security for transfer to lower level BHU 

## 2020-12-10 NOTE — BH Assessment (Signed)
Writer spoke with the patient to complete an updated/reassessment.  Patient admits to having AV/H and started approximately two days ago. Patient acknowledges the thoughts of ending her life. She denies HI.
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
## 2020-12-10 NOTE — Consult Note (Signed)
[facility] Face-to-Face Psychiatry Consult   Reason for Consult:Altered mental status Referring Physician: Dr. Scotty Court Patient Identification: Sandy Mason MRN:  [id number] Principal Diagnosis: Major depressive disorder, recurrent severe without psychotic features (HCC) Diagnosis:  Principal Problem:   Major depressive disorder, recurrent severe without psychotic features (HCC) Active Problems:   Borderline personality disorder (HCC)   PTSD (post-traumatic stress disorder)   Tobacco abuse   Major depressive disorder, recurrent episode, moderate (HCC)   Severe episode of recurrent major depressive disorder, without psychotic features (HCC)  Total Time spent with patient: 20 minutes  Subjective: " I'm alright, I don't know why I was in the bushes."  Client reports not knowing the reason she was found in the bushes, a plethora of minor scratching on both of her arms from the bushes.  She reports a high level of depression with passive suicidal ideations.  Being homeless is her biggest stressor and increasing her symptoms significantly.  She was living with friends until they "kicked me out when they found me talking to people in the bushes" (not there).  No hallucinations or psychosis this morning.  Minimizes her substance abuse--positive for methadone, benzodiazepines, TCAs, and cannabis. Her anxiety is also high with frequent panic attacks, last one was yesterday.  She is prescribed per the PDMP Ativan 1 mg BID (started two months ago) and Vyvanse daily.  Outpatient provider is with [facility].  Agreeable to get help and admit to the psychiatric unit.    Note on Admission from NP, Elenore Paddy: Sandy Mason is a 45 y.o. female patient  presented to [facility] ED via POV voluntary.  Per the ED triage nurse note, Patient presents with scratches from head to toe . Reports she was trying to hide in woods last night to get high. Also upset because she says someone stole her ativan. Patient  seems intoxicated. She reports waking up in woods and the only one there.   This provider saw the patient face-to-face; the chart was reviewed and consulted with Dr. Scotty Court on 12/09/2020 due to the patient's care. It was discussed with the EDP that the patient does meet the criteria to be admitted to the psychiatric inpatient unit. The patient has a history of malingering.  On evaluation, the patient reports alert and oriented x 4; she is preoccupied with misplacing her telephone and her lorazepam that was filled on 05.12.22. The patient has generalized rashes from being in the woods overnight. The patient is cooperative and mood-congruent with affect. The patient has been cooperative during the assessment process. There were periods when she had to be redirected in answering the questions. The patient's speech is coherent, and her thought processes are relevant. Motor behavior appears restless. Eye contact is good, and the patient did not appear to be responding to internal stimuli. The patient's mood is anxious;affect is congruent with mood.  The patient does not appear to be responding to internal or external stimuli. The patient is presenting with some delusional thinking. The patient is presenting with some psychotic and paranoid behaviors. The patient denies auditory hallucinations but admits to visual hallucinations. The patient is coming to grips that she believes that her playing hide and seek in the woods with six teenagers maybe she was experiencing visual hallucinations. The patient denies suicidal, homicidal, and self-injurious ideation.  Plan: The patient is a safety risk to herself and requires psychiatric inpatient admission for stabilization and treatment.  HPI: Per Dr. Scotty Court, HPI Sandy Mason is a 45 y.o. female  with a history of polysubstance abuse, depression, borderline personality disorder is brought to the ED by a friend due to altered mental status.  Patient remembers  being told yesterday afternoon by her roommate that she would have to move out of the roommates house.  Timeline is difficult to establish, but she then reports later that evening drinking alcohol and "playing hide and seek in the woods" with some teenager friends of hers.  She does not think she took extra of her Ativan, but now notes that it is all gone and she thinks it was stolen.  She had 90 tablets filled just a few days ago.  Also reports some hallucinations, mostly visual.  No agitation or flushing, no chest pain or shortness of breath, no SI or HI. She has not taken any of her medication today.    Past Psychiatric History: Risk to Self:  yes Risk to Others:  none Prior Inpatient Therapy:  yes Prior Outpatient Therapy:   [facility]  Past Medical History:  Past Medical History:  Diagnosis Date  . Adult ADHD 05/2020  . Alcohol use disorder severe. 12/18/2014  . Anxiety   . Borderline personality disorder (HCC) 12/18/2014  . CVA (cerebral infarction)   . Depression   . 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 Opioid use disorder, severe, dependence (HCC) 12/20/2014  . PTSD (post-traumatic stress disorder) 03/28/2015  . 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 Seizures (HCC)   . Suicide attempt by hanging [facility]HCC)     Past Surgical History:  Procedure Laterality Date  . NO PAST SURGERIES     Family History:  Family History  Problem Relation Age of Onset  . Diabetes Father   . Dementia Mother   . Anxiety disorder Mother    Family Psychiatric  History:  Social History:  Social History   Substance and Sexual Activity  Alcohol Use Yes   Comment: socially     Social History   Substance and Sexual Activity  Drug Use Yes  . Types: Benzodiazepines, Hydrocodone, Cocaine, Marijuana, Methamphetamines   Comment: prescribed vivance and adderol    Social History   Socioeconomic History   . Marital status: Single    Spouse name: Not on file  . Number of children: Not on file  . Years of education: Not on file  . Highest education level: Not on file  Occupational History  . Not on file  Tobacco Use  . Smoking status: Current Every Day Smoker    Packs/day: 0.00    Years: 20.00    Pack years: 0.00    Types: Cigarettes  . Smokeless tobacco: Never Used  . Tobacco comment: 1-2 cigarettes per day  Vaping Use  . Vaping Use: Never used  Substance and Sexual Activity  . Alcohol use: Yes    Comment: socially  . Drug use: Yes    Types: Benzodiazepines, Hydrocodone, Cocaine, Marijuana, Methamphetamines    Comment: prescribed vivance and adderol  . Sexual activity: Yes    Birth control/protection: I.U.D.  Other Topics Concern  . Not on file  Social History Narrative  . Not on file   Social Determinants of Health   Financial Resource Strain: Not on file  Food Insecurity: Not on file  Transportation Needs: Not on file  Physical Activity: Not on file  Stress: Not on file  Social Connections: Not on file   Additional Social History:    Allergies:   Allergies  Allergen Reactions  . Depakote [Valproic Acid] Anaphylaxis and Other (See Comments)    Reaction:  Seizures   . Haldol [Haloperidol Lactate] Anaphylaxis and Other (See Comments)    Reaction:  Seizures   . Buspar [Buspirone] Other (See Comments)    "lock jaw"  . Meloxicam Other (See Comments) and Swelling    Ankle swelling and reflex  . Keflet [Cephalexin] Rash    Labs:  Results for orders placed or performed during the hospital encounter of 12/09/20 (from the past 48 hour(s))  Comprehensive metabolic panel     Status: Abnormal   Collection Time: 12/09/20  4:44 PM  Result Value Ref Range   Sodium 139 135 - 145 mmol/L   Potassium 3.9 3.5 - 5.1 mmol/L   Chloride 107 98 - 111 mmol/L   CO2 23 22 - 32 mmol/L   Glucose, Bld 107 (H) 70 - 99 mg/dL    Comment: Glucose reference range applies only to samples  taken after fasting for at least 8 hours.   BUN 18 6 - 20 mg/dL   Creatinine, Ser 3.53 0.44 - 1.00 mg/dL   Calcium 9.3 8.9 - 29.9 mg/dL   Total Protein 8.1 6.5 - 8.1 g/dL   Albumin 4.3 3.5 - 5.0 g/dL   AST 242 (H) 15 - 41 U/L   ALT 53 (H) 0 - 44 U/L   Alkaline Phosphatase 76 38 - 126 U/L   Total Bilirubin 0.8 0.3 - 1.2 mg/dL   GFR, Estimated >68 >34 mL/min    Comment: (NOTE) Calculated using the CKD-EPI Creatinine Equation (2021)    Anion gap 9 5 - 15    Comment: Performed at [facility], [address] [place], [place] [address]  Ethanol     Status: None   Collection Time: 12/09/20  4:44 PM  Result Value Ref Range   Alcohol, Ethyl (B) <10 <10 mg/dL    Comment: (NOTE) Lowest detectable limit for serum alcohol is 10 mg/dL.  For medical purposes only. Performed at [facility], [address] [place], [place] [address]   Salicylate level     Status: Abnormal   Collection Time: 12/09/20  4:44 PM  Result Value Ref Range   Salicylate Lvl <7.0 (L) 7.0 - 30.0 mg/dL    Comment: Performed at [facility], [address] [place], [place] [address]  Acetaminophen level     Status: Abnormal   Collection Time: 12/09/20  4:44 PM  Result Value Ref Range   Acetaminophen (Tylenol), Serum <10 (L) 10 - 30 ug/mL    Comment: (NOTE) Therapeutic concentrations vary significantly. A range of 10-30 ug/mL  may be an effective concentration for many patients. However, some  are best treated at concentrations outside of this range. Acetaminophen concentrations >150 ug/mL at 4 hours after ingestion  and >50 ug/mL at 12 hours after ingestion are often associated with  toxic reactions.  Performed at [facility], [address] [place], [place] [address]   cbc     Status: Abnormal   Collection Time: 12/09/20  4:44 PM  Result Value Ref Range   WBC 12.4 (H) 4.0 - 10.5 K/uL   RBC 4.45 3.87 - 5.11 MIL/uL   Hemoglobin 12.8 12.0 - 15.0 g/dL   HCT 81.4 48.1  - 85.6 %   MCV 89.4 80.0 - 100.0 fL   MCH 28.8 26.0 - 34.0 pg   MCHC 32.2 30.0 - 36.0 g/dL   RDW 31.4 97.0 - 26.3 %  Platelets 352 150 - 400 K/uL   nRBC 0.0 0.0 - 0.2 %    Comment: Performed at [facility], [address] [place], [place] [address]  Urine Drug Screen, Qualitative     Status: Abnormal   Collection Time: 12/09/20  4:47 PM  Result Value Ref Range   Tricyclic, Ur Screen POSITIVE (A) NONE DETECTED   Amphetamines, Ur Screen NONE DETECTED NONE DETECTED   MDMA (Ecstasy)Ur Screen NONE DETECTED NONE DETECTED   Cocaine Metabolite,Ur [place] NONE DETECTED NONE DETECTED   Opiate, Ur Screen NONE DETECTED NONE DETECTED   Phencyclidine (PCP) Ur S NONE DETECTED NONE DETECTED   Cannabinoid 50 Ng, Ur [place] POSITIVE (A) NONE DETECTED   Barbiturates, Ur Screen NONE DETECTED NONE DETECTED   Benzodiazepine, Ur Scrn POSITIVE (A) NONE DETECTED   Methadone Scn, Ur POSITIVE (A) NONE DETECTED    Comment: (NOTE) Tricyclics + metabolites, urine    Cutoff 1000 ng/mL Amphetamines + metabolites, urine  Cutoff 1000 ng/mL MDMA (Ecstasy), urine              Cutoff 500 ng/mL Cocaine Metabolite, urine          Cutoff 300 ng/mL Opiate + metabolites, urine        Cutoff 300 ng/mL Phencyclidine (PCP), urine         Cutoff 25 ng/mL Cannabinoid, urine                 Cutoff 50 ng/mL Barbiturates + metabolites, urine  Cutoff 200 ng/mL Benzodiazepine, urine              Cutoff 200 ng/mL Methadone, urine                   Cutoff 300 ng/mL  The urine drug screen provides only a preliminary, unconfirmed analytical test result and should not be used for non-medical purposes. Clinical consideration and professional judgment should be applied to any positive drug screen result due to possible interfering substances. A more specific alternate chemical method must be used in order to obtain a confirmed analytical result. Gas chromatography / mass spectrometry (GC/MS) is the preferred confirm atory  method. Performed at [facility], [address] [place], [place] [address]   POC urine preg, ED     Status: Normal   Collection Time: 12/09/20  9:12 PM  Result Value Ref Range   Preg Test, Ur Negative Negative  Resp Panel by RT-PCR (Flu A&B, Covid) Nasopharyngeal Swab     Status: None   Collection Time: 12/09/20  9:50 PM   Specimen: Nasopharyngeal Swab; Nasopharyngeal(NP) swabs in vial transport medium  Result Value Ref Range   SARS Coronavirus 2 by RT PCR NEGATIVE NEGATIVE    Comment: (NOTE) SARS-CoV-2 target nucleic acids are NOT DETECTED.  The SARS-CoV-2 RNA is generally detectable in upper respiratory specimens during the acute phase of infection. The lowest concentration of SARS-CoV-2 viral copies this assay can detect is 138 copies/mL. A negative result does not preclude SARS-Cov-2 infection and should not be used as the sole basis for treatment or other patient management decisions. A negative result may occur with  improper specimen collection/handling, submission of specimen other than nasopharyngeal swab, presence of viral mutation(s) within the areas targeted by this assay, and inadequate number of viral copies(<138 copies/mL). A negative result must be combined with clinical observations, patient history, and epidemiological information. The expected result is Negative.  Fact Sheet for Patients:  BloggerCourse.com  Fact Sheet for Healthcare Providers:  SeriousBroker.it  This test is no t yet approved or cleared by the [place] and  has been authorized for detection and/or diagnosis of SARS-CoV-2 by FDA under an Emergency Use Authorization (EUA). This EUA will remain  in effect (meaning this test can be used) for the duration of the COVID-19 declaration under Section 564(b)(1) of the Act, 21 U.S.C.section 360bbb-3(b)(1), unless the authorization is terminated  or revoked sooner.        Influenza A by PCR NEGATIVE NEGATIVE   Influenza B by PCR NEGATIVE NEGATIVE    Comment: (NOTE) The Xpert Xpress SARS-CoV-2/FLU/RSV plus assay is intended as an aid in the diagnosis of influenza from Nasopharyngeal swab specimens and should not be used as a sole basis for treatment. Nasal washings and aspirates are unacceptable for Xpert Xpress SARS-CoV-2/FLU/RSV testing.  Fact Sheet for Patients: BloggerCourse.com  Fact Sheet for Healthcare Providers: SeriousBroker.it  This test is not yet approved or cleared by the [place] FDA and has been authorized for detection and/or diagnosis of SARS-CoV-2 by FDA under an Emergency Use Authorization (EUA). This EUA will remain in effect (meaning this test can be used) for the duration of the COVID-19 declaration under Section 564(b)(1) of the Act, 21 U.S.C. section 360bbb-3(b)(1), unless the authorization is terminated or revoked.  Performed at [facility], [address], [place], [place] [address]     Current Facility-Administered Medications  Medication Dose Route Frequency Provider Last Rate Last Admin  . ARIPiprazole (ABILIFY) tablet 5 mg  5 mg Oral Daily Sharman Cheek, MD   5 mg at 12/10/20 1027  . gabapentin (NEURONTIN) capsule 900 mg  900 mg Oral TID Sharman Cheek, MD   900 mg at 12/10/20 1027  . hydrOXYzine (ATARAX/VISTARIL) tablet 25 mg  25 mg Oral Q6H PRN Sharman Cheek, MD      . LORazepam (ATIVAN) injection 0-4 mg  0-4 mg Intravenous Q6H Sharman Cheek, MD       Or  . LORazepam (ATIVAN) tablet 0-4 mg  0-4 mg Oral Q6H Sharman Cheek, MD   2 mg at 12/10/20 1030  . [START ON 12/12/2020] LORazepam (ATIVAN) injection 0-4 mg  0-4 mg Intravenous Q12H Sharman Cheek, MD       Or  . Melene Muller ON 12/12/2020] LORazepam (ATIVAN) tablet 0-4 mg  0-4 mg Oral Q12H Sharman Cheek, MD      . mirtazapine (REMERON) tablet 15 mg  15 mg Oral QHS Sharman Cheek,  MD   15 mg at 12/09/20 2139  . nicotine (NICODERM CQ - dosed in mg/24 hours) patch 21 mg  21 mg Transdermal Daily Sharman Cheek, MD   21 mg at 12/10/20 1027  . ondansetron (ZOFRAN-ODT) disintegrating tablet 4 mg  4 mg Oral Q8H PRN Sharman Cheek, MD      . OXcarbazepine (TRILEPTAL) tablet 150 mg  150 mg Oral BID Sharman Cheek, MD   150 mg at 12/09/20 2139  . thiamine tablet 100 mg  100 mg Oral Daily Sharman Cheek, MD   100 mg at 12/10/20 1027   Or  . thiamine (B-1) injection 100 mg  100 mg Intravenous Daily Sharman Cheek, MD      . traZODone (DESYREL) tablet 50 mg  50 mg Oral QHS PRN Sharman Cheek, MD      . venlafaxine XR (EFFEXOR-XR) 24 hr capsule 300 mg  300 mg Oral Q breakfast Sharman Cheek, MD   300 mg at 12/10/20 0845   Current Outpatient Medications  Medication Sig Dispense Refill  .  acetaminophen (TYLENOL) 325 MG tablet Take 325-650 mg by mouth every 6 (six) hours as needed for mild pain or moderate pain.    . ARIPiprazole (ABILIFY) 5 MG tablet Take 1 tablet (5 mg total) by mouth daily. For mood control 30 tablet 0  . gabapentin (NEURONTIN) 300 MG capsule Take 3 capsules (900 mg total) by mouth 3 (three) times daily. For agitation 270 capsule 0  . hydrOXYzine (ATARAX/VISTARIL) 25 MG tablet Take 1 tablet (25 mg total) by mouth every 6 (six) hours as needed for anxiety. 75 tablet 0  . ibuprofen (ADVIL) 600 MG tablet TAKE ONE TABLET BY MOUTH EVERY 8 HOURS AS NEEDED FOR MODERATE PAIN (Patient not taking: No sig reported) 20 tablet 0  . mirtazapine (REMERON) 15 MG tablet Take 1 tablet (15 mg total) by mouth at bedtime. For depression/sleep 30 tablet 0  . nicotine (NICODERM CQ - DOSED IN MG/24 HOURS) 21 mg/24hr patch Place 1 patch (21 mg total) onto the skin daily. (May buy from over the counter): For smoking cessation 28 patch 0  . nicotine polacrilex (NICORETTE) 2 MG gum Take 1 each (2 mg total) by mouth as needed. (May buy from over the counter): For smoking  cessation 100 tablet 0  . ondansetron (ZOFRAN-ODT) 4 MG disintegrating tablet TAKE ONE TABLET BY MOUTH EVERY 8 HOURS AS NEEDED FOR NAUSEA OR VOMITING 12 tablet 0  . OXcarbazepine (TRILEPTAL) 150 MG tablet Take 1 tablet (150 mg total) by mouth 2 (two) times daily. For mood stabilization 60 tablet 0  . traZODone (DESYREL) 50 MG tablet Take 1 tablet (50 mg total) by mouth at bedtime as needed for sleep. 30 tablet 0  . venlafaxine XR (EFFEXOR-XR) 150 MG 24 hr capsule Take 2 capsules (300 mg total) by mouth daily with breakfast. For depression 60 capsule 0    Musculoskeletal: Strength & Muscle Tone: within normal limits Gait & Station: normal Patient leans: N/A  Psychiatric Specialty Exam:  Presentation  General Appearance: Disheveled; Bizarre  Eye Contact:Good  Speech:Clear and Coherent  Speech Volume:Normal  Handedness:Right  Mood and Affect  Mood:  Anxious and depressed  Affect:Congruent  Thought Process  Thought Processes:Coherent; Goal Directed; Linear  Descriptions of Associations:Intact  Orientation:Full (Time, Place and Person)  Thought Content:Logical; Rumination  History of Schizophrenia/Schizoaffective disorder:No  Duration of Psychotic Symptoms:Less than six months  Hallucinations:Hallucinations: Visual Description of Visual Hallucinations: Playing hide and seek with teenager  Ideas of Reference:Delusions; Paranoia  Suicidal Thoughts:  Passive thoughts  Homicidal Thoughts:Homicidal Thoughts: No  Sensorium  Memory:Immediate Poor; Recent Poor; Remote Poor  Judgment:Poor  Insight:Poor  Executive Functions  Concentration:Good  Attention Span:Good  Recall:Good  Fund of Knowledge:Good  Language:Good  Psychomotor Activity  Psychomotor Activity:Psychomotor Activity: Normal  Assets  Assets:Communication Skills; Desire for Improvement; Housing; Resilience  Sleep  Sleep:  Fair  Physical Exam: Physical Exam Vitals and nursing note reviewed.   HENT:     Head: Normocephalic.     Nose: Nose normal.     Mouth/Throat:     Mouth: Mucous membranes are moist.  Cardiovascular:     Rate and Rhythm: Normal rate.     Pulses: Normal pulses.  Pulmonary:     Effort: Pulmonary effort is normal.  Musculoskeletal:        General: Normal range of motion.     Cervical back: Normal range of motion and neck supple.  Skin:    Findings: Lesion present.  Neurological:     General: No focal deficit present.  Mental Status: She is alert and oriented to person, place, and time. Mental status is at baseline.  Psychiatric:        Attention and Perception: Attention and perception normal.        Mood and Affect: Mood is anxious and depressed.        Speech: Speech normal.        Behavior: Behavior normal. Behavior is cooperative.        Thought Content: Thought content includes suicidal ideation.        Cognition and Memory: Cognition and memory normal.        Judgment: Judgment normal.    Review of Systems  Psychiatric/Behavioral: Positive for depression and suicidal ideas. The patient is nervous/anxious.   All other systems reviewed and are negative.  Blood pressure 119/86, pulse (!) 130, temperature 98.2 F (36.8 C), resp. rate 18, height  (1.727 m), weight 95.3 kg, SpO2 96 %. Body mass index is 31.93 kg/m.  Treatment Plan Summary: Plan The patient is a safety risk to herself and requires psychiatric inpatient admission for stabilization and treatment.  Major depressive disorder, recurrent, severe without psychosis: -Continued Effexor 300 mg daily -Continued Abilify 5 mg daily -continued Trileptal 150 mg BID -Ad[facility] to [facility]  Benzodiazepine abuse: -Ativan detox protocol placed and continued  Insomnia: -Continued Remeron 15 mg daily at bedtime  Disposition: Recommend psychiatric Inpatient admission when medically cleared. Supportive therapy provided about ongoing stressors. Discussed crisis plan, support from  social network, calling 911, coming to the Emergency Department, and calling Suicide Hotline.  Nanine Means, NP 12/10/2020 12:57 PM

## 2020-12-10 NOTE — BH Assessment (Signed)
Patient is to be admitted to [facility] by Psychiatric Nurse Practitioner Nanine Means.  Attending Physician will be Dr. Neale Burly.   Patient has been assigned to room 305, by [facility] Charge Nurse [place].   ER staff is aware of the admission:  Ronnie,ER Secretary    Para March, Patient's Nurse   Key, Patient Access.

## 2020-12-10 NOTE — ED Notes (Signed)
VS not taken, patient asleep 

## 2020-12-10 NOTE — Plan of Care (Signed)
New admission.  Problem: Education: Goal: Knowledge of [facility] Education information/materials will improve Outcome: Not Progressing Goal: Emotional status will improve Outcome: Not Progressing Goal: Mental status will improve Outcome: Not Progressing Goal: Verbalization of understanding the information provided will improve Outcome: Not Progressing   Problem: Safety: Goal: Periods of time without injury will increase Outcome: Not Progressing   Problem: Coping: Goal: Coping ability will improve Outcome: Not Progressing   Problem: Health Behavior/Discharge Planning: Goal: Identification of resources available to assist in meeting health care needs will improve Outcome: Not Progressing   Problem: Self-Concept: Goal: Ability to disclose and discuss suicidal ideas will improve Outcome: Not Progressing Goal: Will verbalize positive feelings about self Outcome: Not Progressing   Problem: Coping: Goal: Coping ability will improve Outcome: Not Progressing Goal: Will verbalize feelings Outcome: Not Progressing   Problem: Self-Concept: Goal: Level of anxiety will decrease Outcome: Not Progressing   Problem: Self-Concept: Goal: Level of anxiety will decrease Outcome: Not Progressing Goal: Ability to modify response to factors that promote anxiety will improve Outcome: Not Progressing

## 2020-12-10 NOTE — ED Notes (Signed)
Patient to day room requesting xanax with visible hand and tongue tremors. Patient reports she thinks she was hallucinating, she states she thought  was playing with kids in the wood but realizes she was hullusinating. ,  CIWA 11, scheduled meds provided.

## 2020-12-10 NOTE — Tx Team (Signed)
Initial Treatment Plan 12/10/2020 3:33 PM Deboraha Sprang TML:[id number]    PATIENT STRESSORS: Financial difficulties Loss of place to stay Substance abuse   PATIENT STRENGTHS: Ability for insight General fund of knowledge Motivation for treatment/growth   PATIENT IDENTIFIED PROBLEMS: Passive SI  Visual hallucinations  Depression  Anxiety  Polysubstance abuse             DISCHARGE CRITERIA:  Ability to meet basic life and health needs Improved stabilization in mood, thinking, and/or behavior Motivation to continue treatment in a less acute level of care Need for constant or close observation no longer present Reduction of life-threatening or endangering symptoms to within safe limits  PRELIMINARY DISCHARGE PLAN: Outpatient therapy Placement in alternative living arrangements  PATIENT/FAMILY INVOLVEMENT: This treatment plan has been presented to and reviewed with the patient, Stephanne Greeley. The patient has been given the opportunity to ask questions and make suggestions.  Iram Astorino, RN 12/10/2020, 3:33 PM

## 2020-12-11 DIAGNOSIS — F332 Major depressive disorder, recurrent severe without psychotic features: Principal | ICD-10-CM

## 2020-12-11 MED ORDER — LISDEXAMFETAMINE DIMESYLATE 20 MG PO CAPS
20.0000 mg | ORAL_CAPSULE | Freq: Every day | ORAL | Status: DC
  Administered 2020-12-11 – 2020-12-13 (×3): 20 mg via ORAL
  Filled 2020-12-11 (×3): qty 1

## 2020-12-11 MED ORDER — AMPHETAMINE-DEXTROAMPHETAMINE 5 MG PO TABS
20.0000 mg | ORAL_TABLET | Freq: Every day | ORAL | Status: DC
  Administered 2020-12-12 – 2020-12-13 (×2): 20 mg via ORAL
  Filled 2020-12-11 (×2): qty 4

## 2020-12-11 MED ORDER — VITAMIN B-12 1000 MCG PO TABS
1000.0000 ug | ORAL_TABLET | Freq: Every day | ORAL | Status: DC
  Administered 2020-12-11 – 2020-12-13 (×3): 1000 ug via ORAL
  Filled 2020-12-11 (×3): qty 1

## 2020-12-11 MED ORDER — LISDEXAMFETAMINE DIMESYLATE 30 MG PO CAPS
50.0000 mg | ORAL_CAPSULE | Freq: Every day | ORAL | Status: DC
  Filled 2020-12-11: qty 1

## 2020-12-11 MED ORDER — LISDEXAMFETAMINE DIMESYLATE 30 MG PO CAPS
30.0000 mg | ORAL_CAPSULE | Freq: Every day | ORAL | Status: DC
  Administered 2020-12-11 – 2020-12-13 (×3): 30 mg via ORAL
  Filled 2020-12-11 (×3): qty 1

## 2020-12-11 NOTE — Progress Notes (Signed)
   LCSW Group Therapy Note    12/11/2020: 1:08-1:45 PM     Type of Therapy and Topic:  Group Therapy:  Overcoming Obstacles     Participation Level:  Active     Description of Group:     In this group patients will be encouraged to explore what they see as obstacles to their own wellness and recovery. They will be guided to discuss their thoughts, feelings, and behaviors related to these obstacles. The group will process together ways to cope with barriers, with attention given to specific choices patients can make. Each patient will be challenged to identify changes they are motivated to make in order to overcome their obstacles. This group will be process-oriented, with patients participating in exploration of their own experiences as well as giving and receiving support and challenge from other group members.     Therapeutic Goals:  1.    Patient will identify personal and current obstacles as they relate to admission.  2.    Patient will identify barriers that currently interfere with their wellness or overcoming obstacles.  3.    Patient will identify feelings, thought process and behaviors related to these barriers.  4.    Patient will identify two changes they are willing to make to overcome these obstacles:        Summary of Patient Progress: Patient spoke about her hallucinations and being in the woods playing with children who were not there. Patient stated she is trying to overcome that and get on the right medication. Patient stated that she was fearful of how she would react to her new medication.          Therapeutic Modalities:    Cognitive Behavioral Therapy  Solution Focused Therapy  Motivational Interviewing  Relapse Prevention Therapy    Susa Simmonds, LCSWA   12/11/2020

## 2020-12-11 NOTE — Progress Notes (Signed)
Patient says she takes high potency B12 outside of the hospital and is wanting to take B12 injections while on the unit.

## 2020-12-11 NOTE — Plan of Care (Signed)
  Problem: Education: Goal: Knowledge of [facility] Education information/materials will improve Outcome: Progressing Goal: Emotional status will improve Outcome: Progressing Goal: Mental status will improve Outcome: Progressing Goal: Verbalization of understanding the information provided will improve Outcome: Progressing   Problem: Safety: Goal: Periods of time without injury will increase Outcome: Progressing   Problem: Coping: Goal: Coping ability will improve Outcome: Progressing   Problem: Health Behavior/Discharge Planning: Goal: Identification of resources available to assist in meeting health care needs will improve Outcome: Progressing   Problem: Self-Concept: Goal: Ability to disclose and discuss suicidal ideas will improve Outcome: Progressing Goal: Will verbalize positive feelings about self Outcome: Progressing   Problem: Coping: Goal: Coping ability will improve Outcome: Progressing Goal: Will verbalize feelings Outcome: Progressing   Problem: Self-Concept: Goal: Level of anxiety will decrease Outcome: Progressing   Problem: Self-Concept: Goal: Level of anxiety will decrease Outcome: Progressing Goal: Ability to modify response to factors that promote anxiety will improve Outcome: Progressing

## 2020-12-11 NOTE — H&P (Addendum)
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 Compliance with prescribed medications will improve and Ability to identify triggers associated with substance abuse/mental health issues will improve  I certify that inpatient services furnished can reasonably be expected to improve the patient's condition.    Jesse Sans, MD 5/15/202211:39 AM

## 2020-12-11 NOTE — Plan of Care (Signed)
  Problem: Education: Goal: Emotional status will improve Outcome: Progressing Goal: Mental status will improve Outcome: Progressing   Problem: Coping: Goal: Coping ability will improve Outcome: Progressing   Problem: Self-Concept: Goal: Level of anxiety will decrease Outcome: Progressing
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
## 2020-12-11 NOTE — Progress Notes (Signed)
Patient said she had a panic attack after seeing the doctor. She was tearful, anxious and tremulous. CIWA 14. She received Ativan 2 mg po per order. She reported headache 8/10. She denies having used any substances and said she had been started on a new medication for her heart that made her psychotic. Denies SI, HI and AVH

## 2020-12-11 NOTE — Progress Notes (Signed)
Patient presents as needy and attention seeking. Stood at the door during shift change requesting Ativan for anxiety and agitation.  Afterward coming to the nurses station asking for various things such as soap and deodorant and to wash clothes and then change of clothes.  During med administration became argumentative regarding her medication, wanting 2mg  of Ativan instead of the 1mg   that was ordered. She denies HI AVH at this encounter but does endorse passive SI and pain from a previous fall and soreness r/t to her encounter in the woods and the scratches on her skin scratches. She was med compliant with the medications that were given and tolerated without incident.  She is monitored with 15 minute safety checks and was encouraged to come to staff with any concerns.     Cleo Butler-Nicholson, LPN

## 2020-12-12 LAB — HEMOGLOBIN A1C
Hgb A1c MFr Bld: 6 % — ABNORMAL HIGH (ref 4.8–5.6)
Mean Plasma Glucose: 125.5 mg/dL

## 2020-12-12 LAB — LIPID PANEL
Cholesterol: 215 mg/dL — ABNORMAL HIGH (ref 0–200)
HDL: 43 mg/dL (ref >40)
LDL Cholesterol: 145 mg/dL — ABNORMAL HIGH (ref 0–99)
Total CHOL/HDL Ratio: 5 RATIO
Triglycerides: 134 mg/dL (ref <150)
VLDL: 27 mg/dL (ref 0–40)

## 2020-12-12 MED ORDER — NICOTINE POLACRILEX 2 MG MT GUM
2.0000 mg | CHEWING_GUM | OROMUCOSAL | Status: DC | PRN
  Administered 2020-12-13 (×3): 2 mg via ORAL
  Filled 2020-12-12 (×3): qty 1

## 2020-12-12 MED ORDER — PRIMIDONE 50 MG PO TABS
25.0000 mg | ORAL_TABLET | Freq: Every day | ORAL | Status: DC
  Administered 2020-12-12: 25 mg via ORAL
  Filled 2020-12-12: qty 1

## 2020-12-12 MED ORDER — ENSURE ENLIVE PO LIQD
237.0000 mL | Freq: Two times a day (BID) | ORAL | Status: DC
  Administered 2020-12-12 – 2020-12-13 (×2): 237 mL via ORAL

## 2020-12-12 NOTE — BHH Counselor (Deleted)
LCSW Group Therapy Note     12/12/2020 2:01 PM     Type of Therapy and Topic:  Group Therapy:  Overcoming Obstacles     Participation Level:  Active     Description of Group:     In this group patients will be encouraged to explore what they see as obstacles to their own wellness and recovery. They will be guided to discuss their thoughts, feelings, and behaviors related to these obstacles. The group will process together ways to cope with barriers, with attention given to specific choices patients can make. Each patient will be challenged to identify changes they are motivated to make in order to overcome their obstacles. This group will be process-oriented, with patients participating in exploration of their own experiences as well as giving and receiving support and challenge from other group members.     Therapeutic Goals:  1.    Patient will identify personal and current obstacles as they relate to admission.  2.    Patient will identify barriers that currently interfere with their wellness or overcoming obstacles.  3.    Patient will identify feelings, thought process and behaviors related to these barriers.  4.    Patient will identify two changes they are willing to make to overcome these obstacles:        Summary of Patient Progress: Patient was present for the entirety of the group session. Patient was an active listener and participated in the topic of discussion, provided helpful advice to others, and added nuance to topic of conversation. She stated that she overcame a personal barrier when she decided to go to substance use treatment at [facility] in [place]. She said that she now does not have to drink alcohol and has learned to "stay in her own lane."She said she used to feel afraid to go without it but now no longer feels like she has to drink.      Therapeutic Modalities:    Cognitive Behavioral Therapy  Solution Focused Therapy  Motivational  Interviewing  Relapse Prevention Therapy     Dominion Kathan [place], MSW, LCSW-A  12/12/2020 2:01 PM

## 2020-12-12 NOTE — [facility] Group Notes (Signed)
LCSW Group Therapy Note     12/12/2020 2:11 PM     Type of Therapy and Topic:  Group Therapy:  Overcoming Obstacles     Participation Level:  Active     Description of Group:     In this group patients will be encouraged to explore what they see as obstacles to their own wellness and recovery. They will be guided to discuss their thoughts, feelings, and behaviors related to these obstacles. The group will process together ways to cope with barriers, with attention given to specific choices patients can make. Each patient will be challenged to identify changes they are motivated to make in order to overcome their obstacles. This group will be process-oriented, with patients participating in exploration of their own experiences as well as giving and receiving support and challenge from other group members.     Therapeutic Goals:  1.    Patient will identify personal and current obstacles as they relate to admission.  2.    Patient will identify barriers that currently interfere with their wellness or overcoming obstacles.  3.    Patient will identify feelings, thought process and behaviors related to these barriers.  4.    Patient will identify two changes they are willing to make to overcome these obstacles:        Summary of Patient Progress: Patient was present for the entirety of the group session. Patient was an active listener and participated in the topic of discussion, provided helpful advice to others, and added nuance to topic of conversation. She stated that she overcame a personal barrier when she decided to go to substance use treatment at [facility] in [place]. She said that she now does not have to drink alcohol and has learned to "stay in her own lane."She said she used to feel afraid to go without it but now no longer feels like she has to drink.      Therapeutic Modalities:    Cognitive Behavioral Therapy  Solution Focused Therapy  Motivational  Interviewing  Relapse Prevention Therapy     Philis Doke [place], MSW, LCSW-A  12/12/2020 2:11 PM

## 2020-12-12 NOTE — Progress Notes (Signed)
Initial Nutrition Assessment  DOCUMENTATION CODES:   Obesity unspecified  INTERVENTION:   Ensure Enlive po BID, each supplement provides 350 kcal and 20 grams of protein  MVI po daily   NUTRITION DIAGNOSIS:   Predicted suboptimal nutrient intake related to social / environmental circumstances (substance abuse) as evidenced by other (comment) (per chart review).  GOAL:   Patient will meet greater than or equal to 90% of their needs  MONITOR:   PO intake,Supplement acceptance  REASON FOR ASSESSMENT:   Malnutrition Screening Tool    ASSESSMENT:   45 y.o. female with a history of polysubstance abuse, MDD, depression, borderline personality disorder, ADHD and CVA who is brought to the ED by a friend due to altered mental status.  Suspect pt with decreased appetite and oral intake at times r/t substance abuse. RD will add supplements to help pt meet her estimated needs. Pt already ordered for daily MVI. Pt eating well in hospital. Per chart, pt appears weight stable at baseline.   Medications reviewed and include: adderall, folic acid, remeron, thiamine, B12, MVI   Labs reviewed:   Diet Order:   Diet Order            Diet regular Room service appropriate? Yes; Fluid consistency: Thin  Diet effective now                EDUCATION NEEDS:   No education needs have been identified at this time  Skin:  Skin Assessment: Reviewed RN Assessment  Last BM:  5/13  Height:   Ht Readings from Last 1 Encounters:  12/10/20 5\' 8"  (1.727 m)    Weight:   Wt Readings from Last 1 Encounters:  12/10/20 97.5 kg    Ideal Body Weight:  63.6 kg  BMI:  Body mass index is 32.69 kg/m.  Estimated Nutritional Needs:   Kcal:  2000-2300kcal/day  Protein:  100-115g/day  Fluid:  1.9-2.2L/day  12/12/20 MS, RD, LDN Please refer to [facility] for RD and/or RD on-call/weekend/after hours pager

## 2020-12-12 NOTE — Progress Notes (Signed)
Patient presents needy with staff, continuing to come up to the nurses station. Patient does present better than when she was first admitted. Patient compliant with medication administration per MD orders. Patient given eduction, support, and encouragement to be active in her treatment plan. Patient being monitored Q 15 minutes for safety per unit protocol. Patient remains safe on the unit.

## 2020-12-12 NOTE — Plan of Care (Signed)
Patient stated she feels ready to D/C tomorrow   Problem: Education: Goal: Emotional status will improve Outcome: Progressing Goal: Mental status will improve Outcome: Progressing
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
## 2020-12-12 NOTE — Progress Notes (Signed)
[facility]  12/12/2020 2:02 PM Sandy Mason  MRN:  [id number]   CC "I'm feeling better"  Subjective:  [age] year old female with history of MDD and BPD presenting to the emergency room after been found in the bushes hallucinating. No acute events overnight, medication compliant, attending to ADLs.   Patient seen during treatment team and again one-on-one. She notes that her goals are to try and obtain housing, and to find a new medication for tremors that does not cause her to hallucinate. She feels that the propranolol was responsible for her most recent episode. RBA of primidone discussed with patient, and she is agreeable to trying this tonight. She was observed calling multiple shelters today. She later calls back and notes she spoke to the landlord for the house she was supposed to move in starting in June, and she will be able to move in now after paying her security deposit. She is very ecstatic about this. No signs of psychosis today. She denies SI/HI/AH/VH. [facility], and is interested in peer support and group therapy at either [facility] or [facility]. She has no other questions or concerns.   Principal Problem: Major depressive disorder, recurrent severe without psychotic features (HCC) Diagnosis: Principal Problem:   Major depressive disorder, recurrent severe without psychotic features (HCC) Active Problems:   Borderline personality disorder (HCC)   PTSD (post-traumatic stress disorder)  Total Time spent with patient: 45 minutes  Past Psychiatric History: See H&P  Past Medical History:  Past Medical History:  Diagnosis Date  . Adult ADHD 05/2020  . Alcohol use disorder severe. 12/18/2014  . Anxiety   . Borderline personality disorder (HCC) 12/18/2014  . CVA (cerebral infarction)   . Depression   . 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 Opioid use disorder, severe,  dependence (HCC) 12/20/2014  . PTSD (post-traumatic stress disorder) 03/28/2015  . 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 Seizures (HCC)   . Suicide attempt by hanging [facility](HCC)     Past Surgical History:  Procedure Laterality Date  . NO PAST SURGERIES     Family History:  Family History  Problem Relation Age of Onset  . Diabetes Father   . Dementia Mother   . Anxiety disorder Mother    Family Psychiatric  History: See H&P Social History:  Social History   Substance and Sexual Activity  Alcohol Use Yes   Comment: socially     Social History   Substance and Sexual Activity  Drug Use Yes  . Types: Benzodiazepines, Hydrocodone, Cocaine, Marijuana, Methamphetamines   Comment: prescribed vivance and adderol    Social History   Socioeconomic History  . Marital status: Single    Spouse name: Not on file  . Number of children: Not on file  . Years of education: Not on file  . Highest education level: Not on file  Occupational History  . Not on file  Tobacco Use  . Smoking status: Current Every Day Smoker    Packs/day: 1.00    Years: 20.00    Pack years: 20.00    Types: Cigarettes  . Smokeless tobacco: Never Used  Vaping Use  . Vaping Use: Every day  Substance and Sexual Activity  . Alcohol use: Yes    Comment: socially  . Drug use: Yes    Types: Benzodiazepines, Hydrocodone, Cocaine, Marijuana, Methamphetamines    Comment: prescribed vivance and adderol  .  Sexual activity: Yes    Birth control/protection: I.U.D.  Other Topics Concern  . Not on file  Social History Narrative  . Not on file   Social Determinants of Health   Financial Resource Strain: Not on file  Food Insecurity: Not on file  Transportation Needs: Not on file  Physical Activity: Not on file  Stress: Not on file  Social Connections: Not on file   Additional Social History:                         Sleep: Fair  Appetite:   Fair  Current Medications: Current Facility-Administered Medications  Medication Dose Route Frequency Provider Last Rate Last Admin  . acetaminophen (TYLENOL) tablet 650 mg  650 mg Oral Q6H PRN Charm Rings, NP   650 mg at 12/12/20 0756  . alum & mag hydroxide-simeth (MAALOX/MYLANTA) 200-200-20 MG/5ML suspension 30 mL  30 mL Oral Q4H PRN Charm Rings, NP      . amphetamine-dextroamphetamine (ADDERALL) tablet 20 mg  20 mg Oral Q lunch Jesse Sans, MD   20 mg at 12/12/20 1210  . ARIPiprazole (ABILIFY) tablet 5 mg  5 mg Oral Daily Charm Rings, NP   5 mg at 12/12/20 0750  . folic acid (FOLVITE) tablet 1 mg  1 mg Oral Daily Jesse Sans, MD   1 mg at 12/12/20 0750  . gabapentin (NEURONTIN) capsule 900 mg  900 mg Oral TID Charm Rings, NP   900 mg at 12/12/20 1210  . hydrOXYzine (ATARAX/VISTARIL) tablet 25 mg  25 mg Oral Q6H PRN Charm Rings, NP   25 mg at 12/12/20 0756  . lisdexamfetamine (VYVANSE) capsule 30 mg  30 mg Oral Daily Jesse Sans, MD   30 mg at 12/12/20 0751   And  . lisdexamfetamine (VYVANSE) capsule 20 mg  20 mg Oral Daily Jesse Sans, MD   20 mg at 12/12/20 0750  . LORazepam (ATIVAN) tablet 1 mg  1 mg Oral BID PRN Jesse Sans, MD   1 mg at 12/12/20 0431  . magnesium hydroxide (MILK OF MAGNESIA) suspension 30 mL  30 mL Oral Daily PRN Charm Rings, NP      . mirtazapine (REMERON) tablet 15 mg  15 mg Oral QHS Charm Rings, NP   15 mg at 12/11/20 2111  . multivitamin with minerals tablet 1 tablet  1 tablet Oral Daily Jesse Sans, MD   1 tablet at 12/12/20 0750  . neomycin-bacitracin-polymyxin (NEOSPORIN) ointment   Topical BID Jesse Sans, MD   Given at 12/12/20 [phone]  . [START ON 12/13/2020] nicotine polacrilex (NICORETTE) gum 2 mg  2 mg Oral Q4H PRN Jesse Sans, MD      . ondansetron (ZOFRAN-ODT) disintegrating tablet 4 mg  4 mg Oral Q8H PRN Charm Rings, NP   4 mg at 12/11/20 1806  . OXcarbazepine (TRILEPTAL) tablet 150 mg   150 mg Oral BID Charm Rings, NP   150 mg at 12/12/20 0750  . primidone (MYSOLINE) tablet 25 mg  25 mg Oral QHS Jesse Sans, MD      . thiamine tablet 100 mg  100 mg Oral Daily Jesse Sans, MD   100 mg at 12/12/20 0750   Or  . thiamine (B-1) injection 100 mg  100 mg Intravenous Daily Jesse Sans, MD      . traZODone (DESYREL) tablet 50 mg  50 mg Oral QHS PRN Charm Rings, NP   50 mg at 12/11/20 2111  . venlafaxine XR (EFFEXOR-XR) 24 hr capsule 300 mg  300 mg Oral Q breakfast Charm Rings, NP   300 mg at 12/12/20 0750  . vitamin B-12 (CYANOCOBALAMIN) tablet 1,000 mcg  1,000 mcg Oral Daily Jesse Sans, MD   1,000 mcg at 12/12/20 7412    Lab Results:  Results for orders placed or performed during the hospital encounter of 12/10/20 (from the past 48 hour(s))  Lipid panel     Status: Abnormal   Collection Time: 12/12/20  6:49 AM  Result Value Ref Range   Cholesterol 215 (H) 0 - 200 mg/dL   Triglycerides 878 <676 mg/dL   HDL 43 >72 mg/dL   Total CHOL/HDL Ratio 5.0 RATIO   VLDL 27 0 - 40 mg/dL   LDL Cholesterol 094 (H) 0 - 99 mg/dL    Comment:        Total Cholesterol/HDL:CHD Risk Coronary Heart Disease Risk Table                     Men   Women  1/2 Average Risk   3.4   3.3  Average Risk       5.0   4.4  2 X Average Risk   9.6   7.1  3 X Average Risk  23.4   11.0        Use the calculated Patient Ratio above and the CHD Risk Table to determine the patient's CHD Risk.        ATP III CLASSIFICATION (LDL):  <100     mg/dL   Optimal  709-628  mg/dL   Near or Above                    Optimal  130-159  mg/dL   Borderline  366-294  mg/dL   High  >765     mg/dL   Very High Performed at [facility], [address] [place], [place] [address]   Hemoglobin A1c     Status: Abnormal   Collection Time: 12/12/20  6:49 AM  Result Value Ref Range   Hgb A1c MFr Bld 6.0 (H) 4.8 - 5.6 %    Comment: (NOTE) Pre diabetes:          5.7%-6.4%  Diabetes:               >6.4%  Glycemic control for   <7.0% adults with diabetes    Mean Plasma Glucose 125.5 mg/dL    Comment: Performed at [facility], [address] [place], [place] [address]    Blood Alcohol level:  Lab Results  Component Value Date   [facility] <10 12/09/2020   ETH <10 11/04/2020    Metabolic Disorder Labs: Lab Results  Component Value Date   HGBA1C 6.0 (H) 12/12/2020   MPG 125.5 12/12/2020   MPG 114.02 11/06/2020   Lab Results  Component Value Date   PROLACTIN 98.1 (H) 12/12/2015   Lab Results  Component Value Date   CHOL 215 (H) 12/12/2020   TRIG 134 12/12/2020   HDL 43 12/12/2020   CHOLHDL 5.0 12/12/2020   VLDL 27 12/12/2020   LDLCALC 145 (H) 12/12/2020   LDLCALC 99 11/06/2020    Physical Findings: AIMS: Facial and Oral Movements Muscles of Facial Expression: None, normal Lips and Perioral Area: None, normal Jaw: None, normal Tongue: None, normal,Extremity Movements Upper (  arms, wrists, hands, fingers): None, normal Lower (legs, knees, ankles, toes): None, normal, Trunk Movements Neck, shoulders, hips: None, normal, Overall Severity Severity of abnormal movements (highest score from questions above): None, normal Incapacitation due to abnormal movements: None, normal Patient's awareness of abnormal movements (rate only patient's report): No Awareness, Dental Status Current problems with teeth and/or dentures?: No Does patient usually wear dentures?: No  CIWA:  CIWA-Ar Total: 7 COWS:     Musculoskeletal: Strength & Muscle Tone: within normal limits Gait & Station: normal Patient leans: N/A  Psychiatric Specialty Exam:  Presentation  General Appearance: Casual; Fairly Groomed  Eye Contact:Good  Speech:Clear and Coherent  Speech Volume:Normal  Handedness:Right   Mood and Affect  Mood:Anxious  Affect:Congruent   Thought Process  Thought Processes:Goal Directed  Descriptions of Associations:Intact  Orientation:Full (Time,  Place and Person)  Thought Content:Logical; Scattered  History of Schizophrenia/Schizoaffective disorder:No  Duration of Psychotic Symptoms:Less than six months  Hallucinations:Hallucinations: None  Ideas of Reference:None  Suicidal Thoughts:Suicidal Thoughts: No  Homicidal Thoughts:Homicidal Thoughts: No   Sensorium  Memory:Immediate Poor; Recent Poor; Remote Poor  Judgment:Intact  Insight:Present   Executive Functions  Concentration:Good  Attention Span:Good  Recall:Good  Fund of Knowledge:Good  Language:Good   Psychomotor Activity  Psychomotor Activity:Psychomotor Activity: Normal   Assets  Assets:Communication Skills; Desire for Improvement; Financial Resources/Insurance; Housing; Physical Health; Resilience; Social Support; Talents/Skills; Vocational/Educational   Sleep  Sleep:Sleep: Fair Number of Hours of Sleep: 6.15    Physical Exam: Physical Exam ROS Blood pressure (!) 114/95, pulse 85, temperature 98 F (36.7 C), temperature source Oral, resp. rate 18, height 5\' 8"  (1.727 m), weight 97.5 kg, SpO2 100 %. Body mass index is 32.69 kg/m.   Treatment Plan Summary: Daily contact with patient to assess and evaluate symptoms and progress in treatment and Medication management 1) MDD, recurrent, severe without psychosis - Continue home Effexor 300 mg daily, abilify 5 mg daily, trileptal 150 mg BID  2) Insomnia - Continue Remeron 15 mg daily  3) ADHD - Continue Vyvanse and Adderall, monitor for symptoms of psychosis  4) Neuropathic pain - Continuee gabapentin 900 mg TID  5) Generalized anxiety disorder - Ativan 1 mg BID PRN, vistaril PRN medications as above  6) PTSD, chronic - Medications as above, patient has outpatient therapy referral in place from . Interested in therapy and peer support through [facility] as well  7) Essential tremor - Patient declines propranolol as she feels this made her hallucinate - Start  Primidone 25 mg QHS [employer], MD 12/12/2020, 2:02 PM
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
## 2020-12-12 NOTE — Progress Notes (Signed)
Recreation Therapy Notes  INPATIENT RECREATION THERAPY ASSESSMENT  Patient Details Name: Sandy Mason MRN: [id number] DOB: 05-18-1976 Today's Date: 12/12/2020       Information Obtained From: Patient  Able to Participate in Assessment/Interview: Yes  Patient Presentation: Responsive  Reason for Admission (Per Patient): Active Symptoms  Patient Stressors: Other (Comment) (Medication)  Coping Skills:   Talk,Exercise,Other (Comment) (Clean)  Leisure Interests (2+):  Individual - Reading,Art - Paint,Nature - Flower gardening,Nature - Vegetable gardening,Exercise - Walking,Music - Listen,Individual - TV,Individual - Computer,Community - Movies,Community - Theater/Cinema  Frequency of Recreation/Participation: Monthly  Awareness of Community Resources:  Yes  Community Resources:  Geologist, engineering  Current Use: No  If no, Barriers?: Transportation,Financial,Attitudinal  Expressed Interest in [employer] Information: Yes  [place]idence[place]  Patient Main Form of Transportation: Uber/Lyft  Patient Strengths:  Honest, Forensic scientist, Good listener, Resilient  Patient Identified Areas of Improvement:  Have more patience  Patient Goal for Hospitalization:  Stay on track  Current SI (including self-harm):  No  Current HI:  No  Current AVH: No  Staff Intervention Plan: Group Attendance,Collaborate with Interdisciplinary Treatment Team  Consent to Intern Participation: N/A  Dashana Guizar 12/12/2020, 3:50 PM

## 2020-12-12 NOTE — Progress Notes (Signed)
D: Pt alert and oriented. Pt rates depression 10/10, hopelessness 10/10, and anxiety 10/10. Pt goal: "Getting another medication to replace propanolol". Pt reports energy level as low and concentration as being poor. Pt reports sleep last night as being fair. Pt did receive medications for sleep and did find them helpful. Pt reports experiencing 8/10 headach pain, prn medication given. Pt denies experiencing any SI/HI, or AVH at this time.   Pt was irritatable and sullen this morning when she was unable to get certain medications. As the day progressed her mood improved. Pt reports she is discharging tomorrow and is very happy about this. Pt reports that she was able to get housing and is only going to have to pay the deposit with is very helpful for her situation.   A: Scheduled medications administered to pt, per MD orders. Support and encouragement provided. Frequent verbal contact made. Routine safety checks conducted q15 minutes.   R: No adverse drug reactions noted. Pt verbally contracts for safety at this time. Pt complaint with medications and treatment plan. Pt interacts well with others on the unit. Pt remains safe at this time. Will continue to monitor.

## 2020-12-12 NOTE — Progress Notes (Signed)
Recreation Therapy Notes  Date: 12/12/2020  Time: 9:30 am   Location: Craft room   Behavioral response: Appropriate   Intervention Topic: Time Management   Discussion/Intervention:  Group content today was focused on time management. The group defined time management and identified healthy ways to manage time. Individuals expressed how much of the 24 hours they use in a day. Patients expressed how much time they use just for themselves personally. The group expressed how they have managed their time in the past. Individuals participated in the intervention "Managing Life" where they had a chance to see how much of the 24 hours they use and where it goes. Clinical Observations/Feedback: Patient came to group late due to unknown reasons. She expressed that time is used for everything in life. Participant explained that she manages her time by making a time limit for things and if she thinks about things just to go ahead and do it. Individual was social with peers and staff while participating in intervention.  Diasha Castleman LRT/CTRS          Shanteria Laye 12/12/2020 12:51 PM

## 2020-12-12 NOTE — Tx Team (Addendum)
Interdisciplinary Treatment and Diagnostic Plan Update  12/12/2020 Time of Session: 9:00AM Crissie Aloi MRN: [id number]  Principal Diagnosis: Major depressive disorder, recurrent severe without psychotic features ([place])  Secondary Diagnoses: Principal Problem:   Major depressive disorder, recurrent severe without psychotic features ([place]) Active Problems:   Borderline personality disorder ([place])   PTSD (post-traumatic stress disorder)   Current Medications:  Current Facility-Administered Medications  Medication Dose Route Frequency Provider Last Rate Last Admin  . acetaminophen (TYLENOL) tablet 650 mg  650 mg Oral Q6H PRN Patrecia Pour, NP   650 mg at 12/12/20 0756  . alum & mag hydroxide-simeth (MAALOX/MYLANTA) 200-200-20 MG/5ML suspension 30 mL  30 mL Oral Q4H PRN Patrecia Pour, NP      . amphetamine-dextroamphetamine (ADDERALL) tablet 20 mg  20 mg Oral Q lunch Selina Cooley M, MD      . ARIPiprazole (ABILIFY) tablet 5 mg  5 mg Oral Daily Patrecia Pour, NP   5 mg at 12/12/20 0750  . folic acid (FOLVITE) tablet 1 mg  1 mg Oral Daily Salley Scarlet, MD   1 mg at 12/12/20 0750  . gabapentin (NEURONTIN) capsule 900 mg  900 mg Oral TID Patrecia Pour, NP   900 mg at 12/12/20 0751  . hydrOXYzine (ATARAX/VISTARIL) tablet 25 mg  25 mg Oral Q6H PRN Patrecia Pour, NP   25 mg at 12/12/20 0756  . lisdexamfetamine (VYVANSE) capsule 30 mg  30 mg Oral Daily Salley Scarlet, MD   30 mg at 12/12/20 0751   And  . lisdexamfetamine (VYVANSE) capsule 20 mg  20 mg Oral Daily Salley Scarlet, MD   20 mg at 12/12/20 0750  . LORazepam (ATIVAN) tablet 1 mg  1 mg Oral BID PRN Salley Scarlet, MD   1 mg at 12/12/20 0431  . magnesium hydroxide (MILK OF MAGNESIA) suspension 30 mL  30 mL Oral Daily PRN Patrecia Pour, NP      . mirtazapine (REMERON) tablet 15 mg  15 mg Oral QHS Patrecia Pour, NP   15 mg at 12/11/20 2111  . multivitamin with minerals tablet 1 tablet  1 tablet Oral Daily  Salley Scarlet, MD   1 tablet at 12/12/20 0750  . neomycin-bacitracin-polymyxin (NEOSPORIN) ointment   Topical BID Salley Scarlet, MD   Given at 12/12/20 [phone]  . nicotine (NICODERM CQ - dosed in mg/24 hours) patch 21 mg  21 mg Transdermal Daily Patrecia Pour, NP   21 mg at 12/12/20 0752  . ondansetron (ZOFRAN-ODT) disintegrating tablet 4 mg  4 mg Oral Q8H PRN Patrecia Pour, NP   4 mg at 12/11/20 1806  . OXcarbazepine (TRILEPTAL) tablet 150 mg  150 mg Oral BID Patrecia Pour, NP   150 mg at 12/12/20 0750  . primidone (MYSOLINE) tablet 25 mg  25 mg Oral QHS Salley Scarlet, MD      . thiamine tablet 100 mg  100 mg Oral Daily Selina Cooley M, MD   100 mg at 12/12/20 0750   Or  . thiamine (B-1) injection 100 mg  100 mg Intravenous Daily Salley Scarlet, MD      . traZODone (DESYREL) tablet 50 mg  50 mg Oral QHS PRN Patrecia Pour, NP   50 mg at 12/11/20 2111  . venlafaxine XR (EFFEXOR-XR) 24 hr capsule 300 mg  300 mg Oral Q breakfast Patrecia Pour, NP   300 mg at 12/12/20 0750  .  vitamin B-12 (CYANOCOBALAMIN) tablet 1,000 mcg  1,000 mcg Oral Daily Salley Scarlet, MD   1,000 mcg at 12/12/20 7829   PTA Medications: Medications Prior to Admission  Medication Sig Dispense Refill Last Dose  . acetaminophen (TYLENOL) 325 MG tablet Take 325-650 mg by mouth every 6 (six) hours as needed for mild pain or moderate pain.     . ARIPiprazole (ABILIFY) 5 MG tablet Take 1 tablet (5 mg total) by mouth daily. For mood control 30 tablet 0   . gabapentin (NEURONTIN) 300 MG capsule Take 3 capsules (900 mg total) by mouth 3 (three) times daily. For agitation 270 capsule 0   . hydrOXYzine (ATARAX/VISTARIL) 25 MG tablet Take 1 tablet (25 mg total) by mouth every 6 (six) hours as needed for anxiety. 75 tablet 0   . ibuprofen (ADVIL) 600 MG tablet TAKE ONE TABLET BY MOUTH EVERY 8 HOURS AS NEEDED FOR MODERATE PAIN (Patient not taking: No sig reported) 20 tablet 0   . mirtazapine (REMERON) 15 MG tablet Take 1  tablet (15 mg total) by mouth at bedtime. For depression/sleep 30 tablet 0   . nicotine (NICODERM CQ - DOSED IN MG/24 HOURS) 21 mg/24hr patch Place 1 patch (21 mg total) onto the skin daily. (May buy from over the counter): For smoking cessation 28 patch 0   . nicotine polacrilex (NICORETTE) 2 MG gum Take 1 each (2 mg total) by mouth as needed. (May buy from over the counter): For smoking cessation 100 tablet 0   . ondansetron (ZOFRAN-ODT) 4 MG disintegrating tablet TAKE ONE TABLET BY MOUTH EVERY 8 HOURS AS NEEDED FOR NAUSEA OR VOMITING 12 tablet 0   . OXcarbazepine (TRILEPTAL) 150 MG tablet Take 1 tablet (150 mg total) by mouth 2 (two) times daily. For mood stabilization 60 tablet 0   . traZODone (DESYREL) 50 MG tablet Take 1 tablet (50 mg total) by mouth at bedtime as needed for sleep. 30 tablet 0   . venlafaxine XR (EFFEXOR-XR) 150 MG 24 hr capsule Take 2 capsules (300 mg total) by mouth daily with breakfast. For depression 60 capsule 0     Patient Stressors: Financial difficulties Loss of place to stay Substance abuse  Patient Strengths: Ability for insight General fund of knowledge Motivation for treatment/growth  Treatment Modalities: Medication Management, Group therapy, Case management,  1 to 1 session with clinician, Psychoeducation, Recreational therapy.   Physician Treatment Plan for Primary Diagnosis: Major depressive disorder, recurrent severe without psychotic features ([place]) Long Term Goal(s): Improvement in symptoms so as ready for discharge Improvement in symptoms so as ready for discharge   Short Term Goals: Ability to identify changes in lifestyle to reduce recurrence of condition will improve Ability to verbalize feelings will improve Ability to disclose and discuss suicidal ideas Ability to demonstrate self-control will improve Ability to identify and develop effective coping behaviors will improve Ability to maintain clinical measurements within normal limits will  improve Compliance with prescribed medications will improve Ability to identify triggers associated with substance abuse/mental health issues will improve Ability to identify changes in lifestyle to reduce recurrence of condition will improve Ability to verbalize feelings will improve Ability to disclose and discuss suicidal ideas Ability to demonstrate self-control will improve Ability to identify and develop effective coping behaviors will improve Ability to maintain clinical measurements within normal limits will improve Compliance with prescribed medications will improve Ability to identify triggers associated with substance abuse/mental health issues will improve  Medication Management: Evaluate patient's response, side effects,  and tolerance of medication regimen.  Therapeutic Interventions: 1 to 1 sessions, Unit Group sessions and Medication administration.  Evaluation of Outcomes: Not Met  Physician Treatment Plan for Secondary Diagnosis: Principal Problem:   Major depressive disorder, recurrent severe without psychotic features ([place]) Active Problems:   Borderline personality disorder ([place])   PTSD (post-traumatic stress disorder)  Long Term Goal(s): Improvement in symptoms so as ready for discharge Improvement in symptoms so as ready for discharge   Short Term Goals: Ability to identify changes in lifestyle to reduce recurrence of condition will improve Ability to verbalize feelings will improve Ability to disclose and discuss suicidal ideas Ability to demonstrate self-control will improve Ability to identify and develop effective coping behaviors will improve Ability to maintain clinical measurements within normal limits will improve Compliance with prescribed medications will improve Ability to identify triggers associated with substance abuse/mental health issues will improve Ability to identify changes in lifestyle to reduce recurrence of condition will improve Ability  to verbalize feelings will improve Ability to disclose and discuss suicidal ideas Ability to demonstrate self-control will improve Ability to identify and develop effective coping behaviors will improve Ability to maintain clinical measurements within normal limits will improve Compliance with prescribed medications will improve Ability to identify triggers associated with substance abuse/mental health issues will improve     Medication Management: Evaluate patient's response, side effects, and tolerance of medication regimen.  Therapeutic Interventions: 1 to 1 sessions, Unit Group sessions and Medication administration.  Evaluation of Outcomes: Not Met   RN Treatment Plan for Primary Diagnosis: Major depressive disorder, recurrent severe without psychotic features ([place]) Long Term Goal(s): Knowledge of disease and therapeutic regimen to maintain health will improve  Short Term Goals: Ability to remain free from injury will improve, Ability to verbalize frustration and anger appropriately will improve, Ability to demonstrate self-control, Ability to participate in decision making will improve, Ability to verbalize feelings will improve, Ability to disclose and discuss suicidal ideas, Ability to identify and develop effective coping behaviors will improve and Compliance with prescribed medications will improve  Medication Management: RN will administer medications as ordered by provider, will assess and evaluate patient's response and provide education to patient for prescribed medication. RN will report any adverse and/or side effects to prescribing provider.  Therapeutic Interventions: 1 on 1 counseling sessions, Psychoeducation, Medication administration, Evaluate responses to treatment, Monitor vital signs and CBGs as ordered, Perform/monitor CIWA, COWS, AIMS and Fall Risk screenings as ordered, Perform wound care treatments as ordered.  Evaluation of Outcomes: Not Met   LCSW Treatment  Plan for Primary Diagnosis: Major depressive disorder, recurrent severe without psychotic features ([place]) Long Term Goal(s): Safe transition to appropriate next level of care at discharge, Engage patient in therapeutic group addressing interpersonal concerns.  Short Term Goals: Engage patient in aftercare planning with referrals and resources, Increase social support, Increase ability to appropriately verbalize feelings, Increase emotional regulation, Facilitate acceptance of mental health diagnosis and concerns, Facilitate patient progression through stages of change regarding substance use diagnoses and concerns, Identify triggers associated with mental health/substance abuse issues and Increase skills for wellness and recovery  Therapeutic Interventions: Assess for all discharge needs, 1 to 1 time with Social worker, Explore available resources and support systems, Assess for adequacy in community support network, Educate family and significant other(s) on suicide prevention, Complete Psychosocial Assessment, Interpersonal group therapy.  Evaluation of Outcomes: Not Met   Progress in Treatment: Attending groups: Yes. Participating in groups: Yes. Taking medication as prescribed: Yes. Toleration medication:  Yes. Family/Significant other contact made: Yes, individual(s) contacted:  pt's friend Patient understands diagnosis: Yes. Discussing patient identified problems/goals with staff: Yes. Medical problems stabilized or resolved: Yes. Denies suicidal/homicidal ideation: Yes. Issues/concerns per patient self-inventory: No. Other: None  New problem(s) identified: No, Describe:  None  New Short Term/Long Term Goal(s): detox, elimination of symptoms of psychosis, medication management for mood stabilization; elimination of SI thoughts; development of comprehensive mental wellness/sobriety plan.   Patient Goals:  "need a medication adjustment and find a shelter."  Discharge Plan or Barriers:  CSW will assist pt in appropriate discharge/aftercare planning.   Reason for Continuation of Hospitalization: Depression Medication stabilization Withdrawal symptoms  Estimated Length of Stay: 1-7 days  Recreational Therapy: Patient Stressors: N/A Patient Goal: Patient will engage in groups without prompting or encouragement from LRT x3 group sessions within 5 recreation therapy group sessions.  Attendees: Patient: Araiya Tilmon 12/12/2020 10:52 AM  Physician: Salley Scarlet, MD 12/12/2020 10:52 AM  Nursing: West Pugh, RN  12/12/2020 10:52 AM  RN Care Manager: 12/12/2020 10:52 AM  Social Worker: Kiva Martinique, MSW, LCSW-A 12/12/2020 10:52 AM  Recreational Therapist: Devin Going, LRT 12/12/2020 10:52 AM  Other: 12/12/2020 10:52 AM  Other: Paulla Dolly, LCSW-A,LCAS-A 12/12/2020 10:52 AM  Other: 12/12/2020 10:52 AM    Scribe for Treatment Team: Kiva A [place], [place] 12/12/2020 10:52 AM

## 2020-12-12 NOTE — Plan of Care (Signed)
  Problem: Education: Goal: Knowledge of [facility] Education information/materials will improve Outcome: Progressing Goal: Emotional status will improve Outcome: Progressing Goal: Mental status will improve Outcome: Progressing Goal: Verbalization of understanding the information provided will improve Outcome: Progressing   Problem: Safety: Goal: Periods of time without injury will increase Outcome: Progressing   Problem: Coping: Goal: Coping ability will improve Outcome: Progressing   Problem: Health Behavior/Discharge Planning: Goal: Identification of resources available to assist in meeting health care needs will improve Outcome: Progressing

## 2020-12-12 NOTE — Progress Notes (Signed)
Recreation Therapy Notes  INPATIENT RECREATION TR PLAN  Patient Details Name: Sandy Mason MRN: [id number] DOB: 1975/11/16 Today's Date: 12/12/2020  Rec Therapy Plan Is patient appropriate for Therapeutic Recreation?: Yes Treatment times per week: at least 3 Estimated Length of Stay: 5-7 days TR Treatment/Interventions: Group participation (Comment)  Discharge Criteria Pt will be discharged from therapy if:: Discharged Treatment plan/goals/alternatives discussed and agreed upon by:: Patient/family  Discharge Summary     Jaclyn Carew 12/12/2020, 3:51 PM

## 2020-12-12 NOTE — Progress Notes (Signed)
Patient is active on the unit. Engages well with others. Has been attention seeking since start of the shift and continues to ask for medication and toiletries. Continues to advocate to be given 2mg  of Ativan vs the 1mg  that she is prescribed.  Explained that she is not in need of a CIWA and is not having any alcohol withdrawal symptoms.  She denies SI  HI  AVH but complains of depression and anxiety.  She also complains of headache for which PRN medication was given.  She is med compliant and received her meds without incident.  She continues to be monitored with 15 minute safety rounds and was encouraged to come to staff with any concerns.        Cleo Butler-Nicholson, LPN

## 2020-12-13 LAB — VITAMIN B12: Vitamin B-12: 1416 pg/mL — ABNORMAL HIGH (ref 180–914)

## 2020-12-13 MED ORDER — PRIMIDONE 50 MG PO TABS: 25.0000 mg | ORAL_TABLET | Freq: Every day | ORAL | 1 refills | Status: AC

## 2020-12-13 MED ORDER — LORAZEPAM 1 MG PO TABS: 1.0000 mg | ORAL_TABLET | Freq: Two times a day (BID) | ORAL | 0 refills | Status: DC | PRN

## 2020-12-13 MED ORDER — HYDROCERIN EX CREA
TOPICAL_CREAM | Freq: Two times a day (BID) | CUTANEOUS | Status: DC
  Filled 2020-12-13: qty 113

## 2020-12-13 NOTE — BHH Suicide Risk Assessment (Signed)
[facility] Discharge Suicide Risk Assessment   Principal Problem: Major depressive disorder, recurrent severe without psychotic features [facility]) Discharge Diagnoses: Principal Problem:   Major depressive disorder, recurrent severe without psychotic features ([facility]) Active Problems:   Borderline personality disorder ([facility])   PTSD (post-traumatic stress disorder)   Total Time spent with patient: 35 minutes- 25 minutes face-to-face contact with patient, 10 minutes documentation, coordination of care, scripts   Musculoskeletal: Strength & Muscle Tone: within normal limits Gait & Station: normal Patient leans: N/A  Psychiatric Specialty Exam  Presentation  General Appearance: Casual; Fairly Groomed  Eye Contact:Good  Speech:Clear and Coherent; Normal Rate  Speech Volume:Normal  Handedness:Right   Mood and Affect  Mood:Euthymic  Duration of Depression Symptoms: Greater than two weeks  Affect:Congruent   Thought Process  Thought Processes:Goal Directed; Linear  Descriptions of Associations:Intact  Orientation:Full (Time, Place and Person)  Thought Content:Logical  History of Schizophrenia/Schizoaffective disorder:No  Duration of Psychotic Symptoms:Less than six months  Hallucinations:Hallucinations: None  Ideas of Reference:None  Suicidal Thoughts:Suicidal Thoughts: No  Homicidal Thoughts:Homicidal Thoughts: No   Sensorium  Memory:Immediate Fair; Recent Fair; Remote Fair  Judgment:Intact  Insight:Present   Executive Functions  Concentration:Good  Attention Span:Good  Recall:Good  Fund of Knowledge:Good  Language:Good   Psychomotor Activity  Psychomotor Activity:Psychomotor Activity: Normal   Assets  Assets:Communication Skills; Desire for Improvement; Financial Resources/Insurance; Housing; Leisure Time; Physical Health; Resilience; Social Support; Talents/Skills; Transportation; Vocational/Educational   Sleep  Sleep:Sleep: Fair Number of  Hours of Sleep: 6   Physical Exam: Physical Exam ROS Blood pressure (!) 132/98, pulse 80, temperature (!) 97.5 F (36.4 C), temperature source Oral, resp. rate 18, height 5\' 8"  (1.727 m), weight 97.5 kg, SpO2 97 %. Body mass index is 32.69 kg/m.  Mental Status Per Nursing Assessment::   On Admission:  Suicidal ideation indicated by patient,Self-harm thoughts  Demographic Factors:  Living alone  Loss Factors: NA  Historical Factors: Prior suicide attempts and Impulsivity  Risk Reduction Factors:   Sense of responsibility to family, Religious beliefs about death, Employed, Positive social support, Positive therapeutic relationship and Positive coping skills or problem solving skills  Continued Clinical Symptoms:  Depression:   Recent sense of peace/wellbeing Previous Psychiatric Diagnoses and Treatments  Cognitive Features That Contribute To Risk:  None    Suicide Risk:  Minimal: No identifiable suicidal ideation.  Patients presenting with no risk factors but with morbid ruminations; may be classified as minimal risk based on the severity of the depressive symptoms   Follow-up Information    [facility] Follow up.   [phone], peer support services, will pick you up for your appointment on Monday 19 Dec 2020 @ 0700 AM.  21 Dec 2020 information: [address] [place] [phone]        Pc, [phone]. Schedule an appointment as soon as possible for a visit.   Why: If you are interested in recieving services please present for walkin hours Monday - Friday 9am-3pm.  Contact information: [address] [place] [phone]        Pll[facility], [place] [phone]. Go on 12/19/2020.   Why: Please present for scheduled medication management appointment Monday 19 Dec 2020 at 0840AM.  Contact information: [address] [place] [phone]               Plan Of  Care/Follow-up recommendations:  Activity:  as tolerated Diet:  regular diet  [phone], MD 12/13/2020, 9:44 AM

## 2020-12-13 NOTE — Plan of Care (Signed)
  Problem: Group Participation Goal: STG - Patient will engage in groups without prompting or encouragement from LRT x3 group sessions within 5 recreation therapy group sessions Description: STG - Patient will engage in groups without prompting or encouragement from LRT x3 group sessions within 5 recreation therapy group sessions 12/13/2020 1131 by Alveria Apley, LRT Outcome: Adequate for Discharge 12/13/2020 1131 by Alveria Apley, LRT Outcome: Adequate for Discharge

## 2020-12-13 NOTE — Progress Notes (Signed)
Recreation Therapy Notes   Date: 12/13/2020  Time: 9:30 am   Location: Courtyard   Behavioral response: Appropriate   Intervention Topic: Leisure   Discussion/Intervention:  Group content today was focused on leisure. The group defined what leisure is and some positive leisure activities they participate in. Individuals identified the difference between good and bad leisure. Participants expressed how they feel after participating in the leisure of their choice. The group discussed how they go about picking a leisure activity and if others are involved in their leisure activities. The patient stated how many leisure activities they have to choose from and reasons why it is important to have leisure time. Individuals participated in the intervention "Exploration of Leisure" where they had a chance to identify new leisure activities as well as benefits of leisure. Clinical Observations/Feedback: Patient came to group late and identified flower gardening as a leisure she enjoys. Individual was social with peers and staff while participating in intervention.  Pawel Soules LRT/CTRS         Vester Titsworth 12/13/2020 11:02 AM

## 2020-12-13 NOTE — BHH Group Notes (Signed)
LCSW Group Therapy Note  12/13/2020 1:49 PM  Type of Therapy/Topic:  Group Therapy:  Feelings about Diagnosis  Participation Level:  Did Not Attend   Description of Group:   This group will allow patients to explore their thoughts and feelings about diagnoses they have received. Patients will be guided to explore their level of understanding and acceptance of these diagnoses. Facilitator will encourage patients to process their thoughts and feelings about the reactions of others to their diagnosis and will guide patients in identifying ways to discuss their diagnosis with significant others in their lives. This group will be process-oriented, with patients participating in exploration of their own experiences, giving and receiving support, and processing challenge from other group members.   Therapeutic Goals: 1. Patient will demonstrate understanding of diagnosis as evidenced by identifying two or more symptoms of the disorder 2. Patient will be able to express two feelings regarding the diagnosis 3. Patient will demonstrate their ability to communicate their needs through discussion and/or role play  Summary of Patient Progress: X  Therapeutic Modalities:   Cognitive Behavioral Therapy Brief Therapy Feelings Identification   Sandy Mason R. Algis Greenhouse, MSW, LCSW, LCAS 12/13/2020 1:49 PM

## 2020-12-13 NOTE — Discharge Summary (Signed)
Physician Discharge Summary Note  Patient:  Sandy Mason is an 45 y.o., female MRN:  [id number] DOB:  May 21, 1976 Patient phone:  [phone] (home)  Patient address:   [address] [place] [address],  Total Time spent with patient: 35 minutes- 25 minutes face-to-face contact with patient, 10 minutes documentation, coordination of care, scripts   Date of Admission:  12/10/2020 Date of Discharge: 12/13/2020  Reason for Admission:  45 year old female with history of MDD and BPD presenting to the emergency room after been found in the bushes hallucinating  Principal Problem: Major depressive disorder, recurrent severe without psychotic features [facility]) Discharge Diagnoses: Principal Problem:   Major depressive disorder, recurrent severe without psychotic features (HCC) Active Problems:   Borderline personality disorder (HCC)   PTSD (post-traumatic stress disorder)   Past Psychiatric History: Currently seen by Baldo Ash for outpatient psychiatry. Diagnoses include MDD, BPD, AUD, opioid use disorder, and ADHD. Current medication regimen includes effexor, mirtazapine, ativan, vyvanse, adderall, abilify, and trilleptal. Previous suicide attempts. Multiple hospitalizations in the past, most recently in April at [facility]  Past Medical History:  Past Medical History:  Diagnosis Date  . Adult ADHD 05/2020  . Alcohol use disorder severe. 12/18/2014  . Anxiety   . Borderline personality disorder (HCC) 12/18/2014  . CVA (cerebral infarction)   . Depression   . 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 Opioid use disorder, severe, dependence (HCC) 12/20/2014  . PTSD (post-traumatic stress disorder) 03/28/2015  . 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 Seizures (HCC)   . Suicide attempt by hanging [facility])     Past Surgical History:  Procedure Laterality Date  . NO PAST SURGERIES     Family  History:  Family History  Problem Relation Age of Onset  . Diabetes Father   . Dementia Mother   . Anxiety disorder Mother    Family Psychiatric  History: Mother-Dementia, Anxiety disorder Social History:  Social History   Substance and Sexual Activity  Alcohol Use Yes   Comment: socially     Social History   Substance and Sexual Activity  Drug Use Yes  . Types: Benzodiazepines, Hydrocodone, Cocaine, Marijuana, Methamphetamines   Comment: prescribed vivance and adderol    Social History   Socioeconomic History  . Marital status: Single    Spouse name: Not on file  . Number of children: Not on file  . Years of education: Not on file  . Highest education level: Not on file  Occupational History  . Not on file  Tobacco Use  . Smoking status: Current Every Day Smoker    Packs/day: 1.00    Years: 20.00    Pack years: 20.00    Types: Cigarettes  . Smokeless tobacco: Never Used  Vaping Use  . Vaping Use: Every day  Substance and Sexual Activity  . Alcohol use: Yes    Comment: socially  . Drug use: Yes    Types: Benzodiazepines, Hydrocodone, Cocaine, Marijuana, Methamphetamines    Comment: prescribed vivance and adderol  . Sexual activity: Yes    Birth control/protection: I.U.D.  Other Topics Concern  . Not on file  Social History Narrative  . Not on file   Social Determinants of Health   Financial Resource Strain: Not on file  Food Insecurity: Not on file  Transportation Needs: Not on file  Physical Activity: Not on file  Stress: Not on file  Social Connections: Not on file  Hospital Course:  45 year old female with history of MDD and BPD presenting to the emergency room after been found in the bushes hallucinating. Patient denied any illicit substance use or alcohol use. However, UDS positive for methadone and THC. Only recent medication change was addition of propranolol for essential tremor. Psychiatric medications restarted unchanged aside from  discontinuing propranolol. Primidone 25 mg QHS was started for tremor. Patient improved during hospital stay, and had no recurrence of hallucinations. She denies SI/HI/AH/VH today. Patient and treatment team feel she is safe to discharge with outpatient follow-up.   Physical Findings: AIMS: Facial and Oral Movements Muscles of Facial Expression: None, normal Lips and Perioral Area: None, normal Jaw: None, normal Tongue: None, normal,Extremity Movements Upper (arms, wrists, hands, fingers): None, normal Lower (legs, knees, ankles, toes): None, normal, Trunk Movements Neck, shoulders, hips: None, normal, Overall Severity Severity of abnormal movements (highest score from questions above): None, normal Incapacitation due to abnormal movements: None, normal Patient's awareness of abnormal movements (rate only patient's report): No Awareness, Dental Status Current problems with teeth and/or dentures?: No Does patient usually wear dentures?: No  CIWA:  CIWA-Ar Total: 7 COWS:     Musculoskeletal: Strength & Muscle Tone: within normal limits Gait & Station: normal Patient leans: N/A                Psychiatric Specialty Exam:  Presentation  General Appearance: Casual; Fairly Groomed  Eye Contact:Good  Speech:Clear and Coherent; Normal Rate  Speech Volume:Normal  Handedness:Right   Mood and Affect  Mood:Euthymic  Affect:Congruent   Thought Process  Thought Processes:Goal Directed; Linear  Descriptions of Associations:Intact  Orientation:Full (Time, Place and Person)  Thought Content:Logical  History of Schizophrenia/Schizoaffective disorder:No  Duration of Psychotic Symptoms:Less than six months  Hallucinations:Hallucinations: None  Ideas of Reference:None  Suicidal Thoughts:Suicidal Thoughts: No  Homicidal Thoughts:Homicidal Thoughts: No   Sensorium  Memory:Immediate Fair; Recent Fair; Remote  Fair  Judgment:Intact  Insight:Present   Executive Functions  Concentration:Good  Attention Span:Good  Recall:Good  Fund of Knowledge:Good  Language:Good   Psychomotor Activity  Psychomotor Activity:Psychomotor Activity: Normal   Assets  Assets:Communication Skills; Desire for Improvement; Financial Resources/Insurance; Housing; Leisure Time; Physical Health; Resilience; Social Support; Talents/Skills; Transportation; Vocational/Educational   Sleep  Sleep:Sleep: Fair Number of Hours of Sleep: 6    Physical Exam: Physical Exam Vitals and nursing note reviewed.  Constitutional:      Appearance: Normal appearance.  HENT:     Head: Normocephalic and atraumatic.     Right Ear: External ear normal.     Left Ear: External ear normal.     Nose: Nose normal.     Mouth/Throat:     Mouth: Mucous membranes are moist.     Pharynx: Oropharynx is clear.  Eyes:     Extraocular Movements: Extraocular movements intact.     Conjunctiva/sclera: Conjunctivae normal.     Pupils: Pupils are equal, round, and reactive to light.  Cardiovascular:     Rate and Rhythm: Normal rate.     Pulses: Normal pulses.  Pulmonary:     Effort: Pulmonary effort is normal.     Breath sounds: Normal breath sounds.  Abdominal:     General: Abdomen is flat.     Palpations: Abdomen is soft.  Musculoskeletal:        General: No swelling. Normal range of motion.     Cervical back: Normal range of motion and neck supple.  Skin:    General: Skin is warm and dry.  Comments: Multitude of superficial scratches on arms that are healing well since admission  Neurological:     General: No focal deficit present.     Mental Status: She is alert and oriented to person, place, and time.  Psychiatric:        Mood and Affect: Mood normal.        Behavior: Behavior normal.        Thought Content: Thought content normal.        Judgment: Judgment normal.    Review of Systems  Constitutional: Negative.    HENT: Negative.   Eyes: Negative.   Respiratory: Negative.   Cardiovascular: Negative.   Gastrointestinal: Negative.   Genitourinary: Negative.   Musculoskeletal: Negative.   Skin: Positive for itching. Negative for rash.  Neurological: Negative.   Endo/Heme/Allergies: Negative.   Psychiatric/Behavioral: Negative for depression, hallucinations, memory loss and suicidal ideas. The patient does not have insomnia.    Blood pressure (!) 132/98, pulse 80, temperature (!) 97.5 F (36.4 C), temperature source Oral, resp. rate 18, height 5\' 8"  (1.727 m), weight 97.5 kg, SpO2 97 %. Body mass index is 32.69 kg/m.   Have you used any form of tobacco in the last 30 days? (Cigarettes, Smokeless Tobacco, Cigars, and/or Pipes): Yes  Has this patient used any form of tobacco in the last 30 days? (Cigarettes, Smokeless Tobacco, Cigars, and/or Pipes)  Yes, A prescription for an FDA-approved tobacco cessation medication was offered at discharge and the patient refused  Blood Alcohol level:  Lab Results  Component Value Date   [facility]   ETH <10 11/04/2020    Metabolic Disorder Labs:  Lab Results  Component Value Date   HGBA1C 6.0 (H) 12/12/2020   MPG 125.5 12/12/2020   MPG 114.02 11/06/2020   Lab Results  Component Value Date   PROLACTIN 98.1 (H) 12/12/2015   Lab Results  Component Value Date   CHOL 215 (H) 12/12/2020   TRIG 134 12/12/2020   HDL 43 12/12/2020   CHOLHDL 5.0 12/12/2020   VLDL 27 12/12/2020   LDLCALC 145 (H) 12/12/2020   LDLCALC 99 11/06/2020    See Psychiatric Specialty Exam and Suicide Risk Assessment completed by Attending Physician prior to discharge.  Discharge destination:  Home  Is patient on multiple antipsychotic therapies at discharge:  No   Has Patient had three or more failed trials of antipsychotic monotherapy by history:  No  Recommended Plan for Multiple Antipsychotic Therapies: NA  Discharge Instructions    Diet general   Complete  by: As directed    Increase activity slowly   Complete by: As directed      Allergies as of 12/13/2020      Reactions   Depakote [valproic Acid] Anaphylaxis, Other (See Comments)   Reaction:  Seizures    Haldol [haloperidol Lactate] Anaphylaxis, Other (See Comments)   Reaction:  Seizures    Buspar [buspirone] Other (See Comments)   "lock jaw"   Meloxicam Other (See Comments), Swelling   Ankle swelling and reflex   Keflet [cephalexin] Rash      Medication List    STOP taking these medications   nicotine 21 mg/24hr patch Commonly known as: NICODERM CQ - dosed in mg/24 hours     TAKE these medications     Indication  acetaminophen 325 MG tablet Commonly known as: TYLENOL Take 325-650 mg by mouth every 6 (six) hours as needed for mild pain or moderate pain.  Indication: Fever, Pain   ARIPiprazole 5  MG tablet Commonly known as: ABILIFY Take 1 tablet (5 mg total) by mouth daily. For mood control  Indication: Mood control   gabapentin 300 MG capsule Commonly known as: NEURONTIN Take 3 capsules (900 mg total) by mouth 3 (three) times daily. For agitation  Indication: Agitation   hydrOXYzine 25 MG tablet Commonly known as: ATARAX/VISTARIL Take 1 tablet (25 mg total) by mouth every 6 (six) hours as needed for anxiety.  Indication: Feeling Anxious   ibuprofen 600 MG tablet Commonly known as: ADVIL TAKE ONE TABLET BY MOUTH EVERY 8 HOURS AS NEEDED FOR MODERATE PAIN  Indication: Fever, Pain   LORazepam 1 MG tablet Commonly known as: ATIVAN Take 1 tablet (1 mg total) by mouth 2 (two) times daily as needed for anxiety.  Indication: Feeling Anxious   mirtazapine 15 MG tablet Commonly known as: REMERON Take 1 tablet (15 mg total) by mouth at bedtime. For depression/sleep  Indication: Major Depressive Disorder, Insomnia   nicotine polacrilex 2 MG gum Commonly known as: NICORETTE Take 1 each (2 mg total) by mouth as needed. (May buy from over the counter): For smoking  cessation  Indication: Nicotine Addiction   ondansetron 4 MG disintegrating tablet Commonly known as: ZOFRAN-ODT TAKE ONE TABLET BY MOUTH EVERY 8 HOURS AS NEEDED FOR NAUSEA OR VOMITING  Indication: Nausea and Vomiting   OXcarbazepine 150 MG tablet Commonly known as: TRILEPTAL Take 1 tablet (150 mg total) by mouth 2 (two) times daily. For mood stabilization  Indication: Mood stabilization   primidone 50 MG tablet Commonly known as: MYSOLINE Take 0.5 tablets (25 mg total) by mouth at bedtime.  Indication: Fine to Coarse Slow Tremor Affecting Head, Hands & Voice   traZODone 50 MG tablet Commonly known as: DESYREL Take 1 tablet (50 mg total) by mouth at bedtime as needed for sleep.  Indication: Trouble Sleeping   venlafaxine XR 150 MG 24 hr capsule Commonly known as: EFFEXOR-XR Take 2 capsules (300 mg total) by mouth daily with breakfast. For depression  Indication: Major Depressive Disorder       Follow-up Information    [facility] Follow up.   WhyLarey Seat, peer support services, will pick you up for your appointment on Monday 19 Dec 2020 @ 0700 AM.  Benay Pillow information: [name] [place] [address] [phone]        Pc, [employer]. Schedule an appointment as soon as possible for a visit.   Why: If you are interested in recieving services please present for walkin hours Monday - Friday 9am-3pm.  Contact information: [address] [place] [address] [phone]        Pllc, [employer]. Go on 12/19/2020.   Why: Please present for scheduled medication management appointment Monday 19 Dec 2020 at 0840AM.  Contact information: [address] [place] [address] [phone]               Follow-up recommendations:  Activity:  as tolerated Diet:  regular diet  Comments:  Script for primidone and ativan sent to [facility]  Signed: Jesse Sans, MD 12/13/2020,  9:45 AM

## 2020-12-13 NOTE — Progress Notes (Signed)
Recreation Therapy Notes  INPATIENT RECREATION TR PLAN  Patient Details Name: Sandy Mason MRN: [id number] DOB: 1976-07-03 Today's Date: 12/13/2020  Rec Therapy Plan Is patient appropriate for Therapeutic Recreation?: Yes Treatment times per week: at least 3 Estimated Length of Stay: 5-7 days TR Treatment/Interventions: Group participation (Comment)  Discharge Criteria Pt will be discharged from therapy if:: Discharged Treatment plan/goals/alternatives discussed and agreed upon by:: Patient/family  Discharge Summary Short term goals set: Patient will engage in groups without prompting or encouragement from LRT x3 group sessions within 5 recreation therapy group sessions Short term goals met: Adequate for discharge Progress toward goals comments: Groups attended Which groups?: Leisure education,Other (Comment) (Time Management) Reason goals not met: N/A Therapeutic equipment acquired: N/A Reason patient discharged from therapy: Discharge from hospital Pt/family agrees with progress & goals achieved: Yes Date patient discharged from therapy: 12/13/20   Faye Sanfilippo 12/13/2020, 11:37 AM

## 2020-12-13 NOTE — Progress Notes (Addendum)
Sandy Mason appears calm and cooperative. She had a complaint of anxiety this morning, but refused PRNs. She denied SI, HI, and AVH. She remains safe on the unit and interacts appropriately with other patients.   Discharge instructions were explained to patient and she verbalized understanding. Medications and appointments were reviewed with patient. Patient was discharged safely with staff member.

## 2020-12-13 NOTE — Progress Notes (Signed)
  [facility] Adult Case Management Discharge Plan :  Will you be returning to the same living situation after discharge:  No. At discharge, do you have transportation home?: Yes,  pt's friend will be providing transportation. Do you have the ability to pay for your medications: Yes,  L[employer] Medicaid/[employer].  Release of information consent forms completed and in the chart;  Patient's signature needed at discharge.  Patient to Follow up at:  Follow-up Information    [facility] Follow up.   WhyLarey Mason, peer support services, will pick you up for your appointment on Monday 19 Dec 2020 @ 0700 AM.  Sandy Mason information: [name] [place] [address] [phone]        Pc, [employer]. Schedule an appointment as soon as possible for a visit.   Why: If you are interested in recieving services please present for walkin hours Monday - Friday 9am-3pm.  Contact information: [address] [place] [address] [phone]        Pllc, [employer]. Go on 12/19/2020.   Why: Please present for scheduled medication management appointment Monday 19 Dec 2020 at 0840AM.  Contact information: [address] [place] [address] [phone]               Next level of care provider has access to [facility] Link:no  Safety Planning and Suicide Prevention discussed: Yes,  SPE completed with friend.  Have you used any form of tobacco in the last 30 days? (Cigarettes, Smokeless Tobacco, Cigars, and/or Pipes): Yes  Has patient been referred to the Quitline?: Patient refused referral  Patient has been referred for addiction treatment: Pt. refused referral  Glenis Smoker, LCSW 12/13/2020, 9:01 AM

## 2020-12-24 IMAGING — CR RIGHT ANKLE - COMPLETE 3+ VIEW
1 series · 3 of 3 positions shown · non-contrast
Comparison: None.

CLINICAL DATA: 42-year-old female status post fall 2 days ago with
pain swelling and bruising.

EXAM:
RIGHT ANKLE - COMPLETE 3+ VIEW

[Series 1: dg ankle complete right · 0.14mm/px · 3 of 3 slices shown]
[im 1/3]
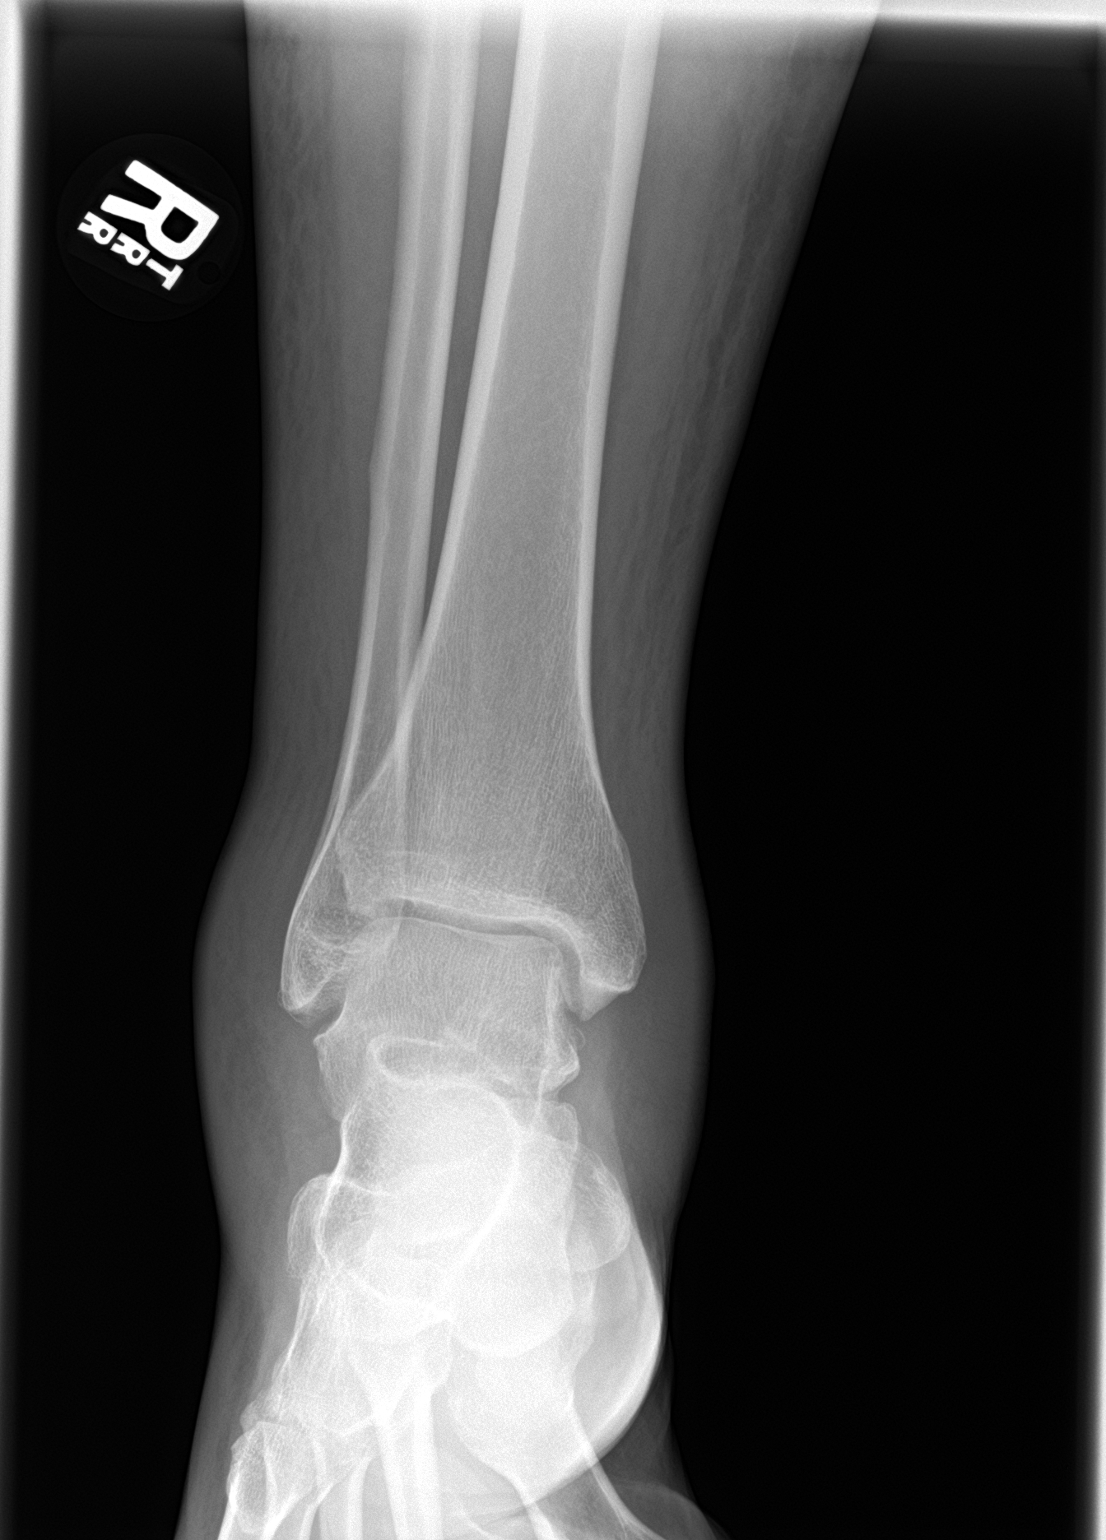
[im 2/3]
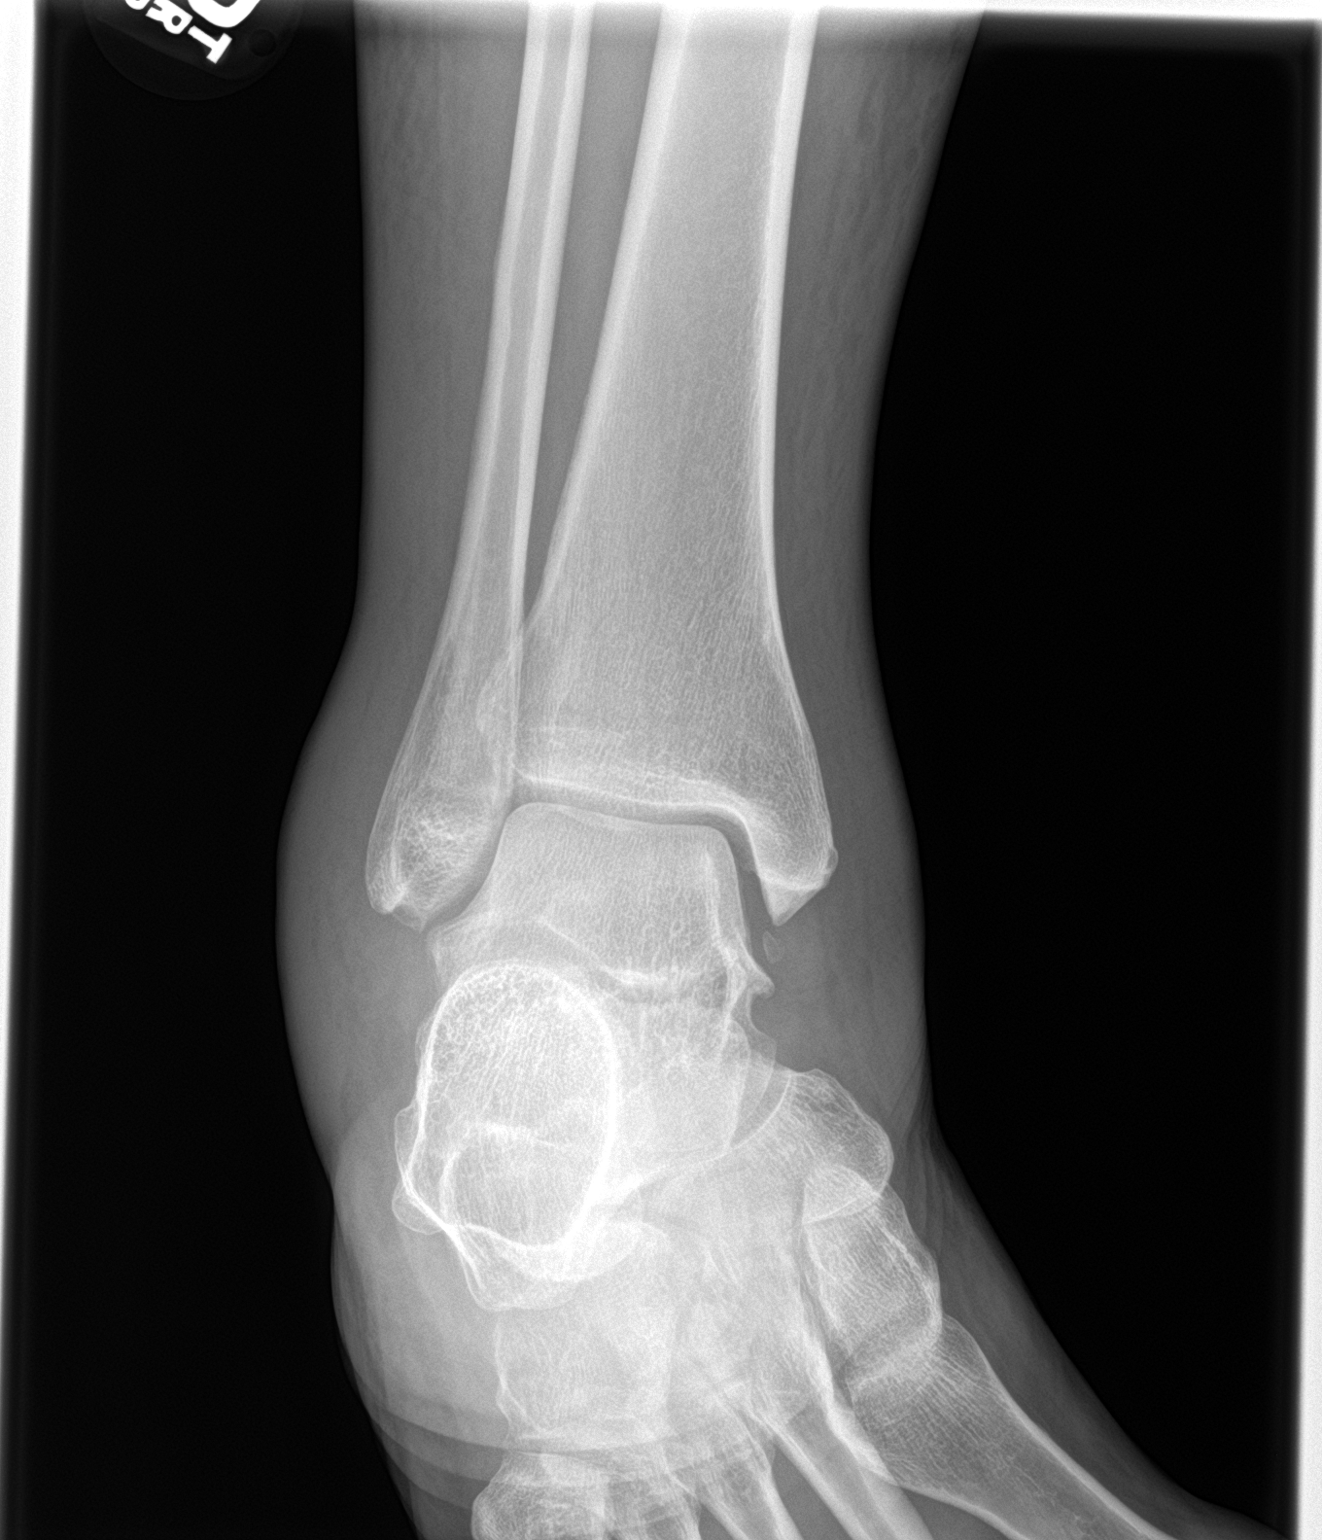
[im 3/3]
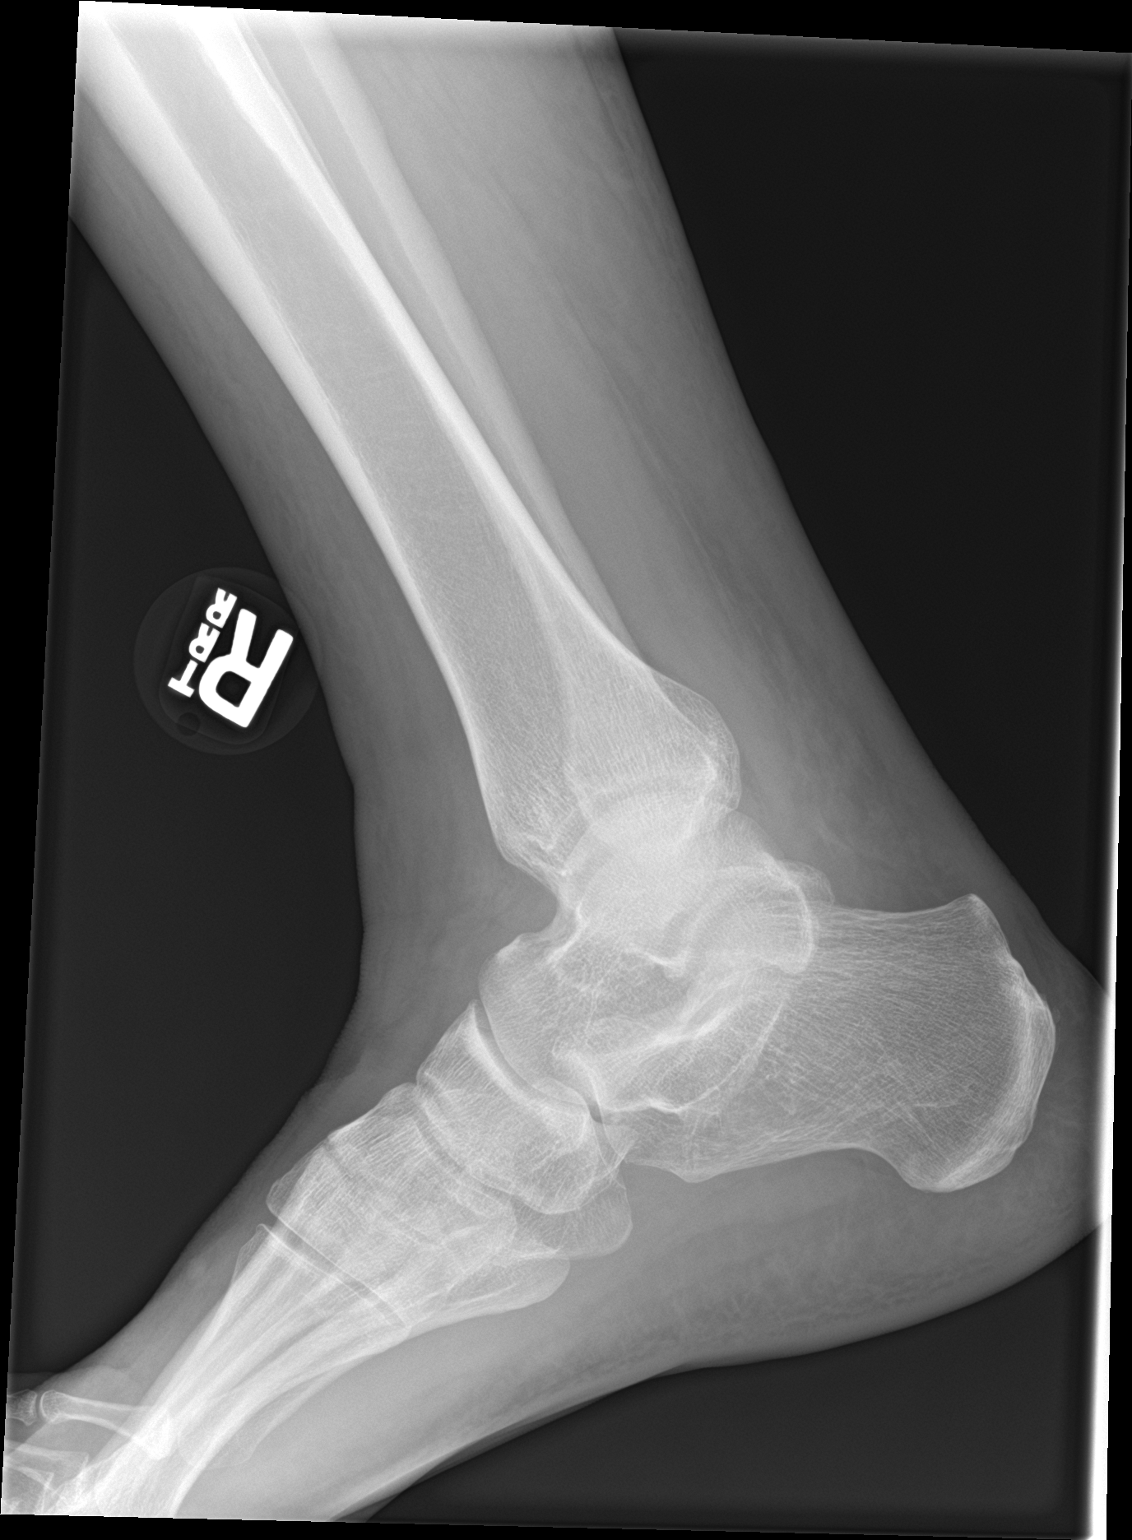

[3 of 3 positions shown; findings below may reference images not displayed]

FINDINGS: Generalized moderate to severe soft tissue swelling about the ankle.
Mortise joint alignment preserved. Talar dome intact. No definite
joint effusion. Small corticated and chronic ossific fragment near
the tip of the medial malleolus. No acute osseous abnormality
identified.
IMPRESSION: Soft tissue swelling with no acute fracture or dislocation
identified about the right ankle. Small chronic appearing fracture
fragment adjacent to the medial malleolus.

## 2021-01-18 ENCOUNTER — Ambulatory Visit: Payer: Self-pay

## 2021-03-08 ENCOUNTER — Emergency Department: Payer: Medicaid Other

## 2021-03-08 ENCOUNTER — Other Ambulatory Visit: Payer: Self-pay

## 2021-03-08 ENCOUNTER — Emergency Department
Admission: EM | Admit: 2021-03-08 | Discharge: 2021-03-08 | Disposition: A | Discharge status: Home / Self Care | Payer: Medicaid Other | Attending: Emergency Medicine | Admitting: Emergency Medicine

## 2021-03-08 DIAGNOSIS — S0003XA Contusion of scalp, initial encounter: Secondary | ICD-10-CM | POA: Diagnosis not present

## 2021-03-08 DIAGNOSIS — S0511XA Contusion of eyeball and orbital tissues, right eye, initial encounter: Secondary | ICD-10-CM

## 2021-03-08 DIAGNOSIS — S0990XA Unspecified injury of head, initial encounter: Secondary | ICD-10-CM

## 2021-03-08 DIAGNOSIS — S0011XA Contusion of right eyelid and periocular area, initial encounter: Secondary | ICD-10-CM | POA: Diagnosis not present

## 2021-03-08 DIAGNOSIS — R070 Pain in throat: Secondary | ICD-10-CM | POA: Insufficient documentation

## 2021-03-08 DIAGNOSIS — F17210 Nicotine dependence, cigarettes, uncomplicated: Secondary | ICD-10-CM | POA: Diagnosis not present

## 2021-03-08 DIAGNOSIS — F419 Anxiety disorder, unspecified: Secondary | ICD-10-CM | POA: Diagnosis not present

## 2021-03-08 DIAGNOSIS — M25571 Pain in right ankle and joints of right foot: Secondary | ICD-10-CM | POA: Diagnosis not present

## 2021-03-08 LAB — BASIC METABOLIC PANEL
Anion gap: 5 (ref 5–15)
BUN: 10 mg/dL (ref 6–20)
CO2: 26 mmol/L (ref 22–32)
Calcium: 8.9 mg/dL (ref 8.9–10.3)
Chloride: 107 mmol/L (ref 98–111)
Creatinine, Ser: 0.52 mg/dL (ref 0.44–1.00)
GFR, Estimated: >60 mL/min (ref >60)
Glucose, Bld: 108 mg/dL — ABNORMAL HIGH (ref 70–99)
Potassium: 3.7 mmol/L (ref 3.5–5.1)
Sodium: 138 mmol/L (ref 135–145)

## 2021-03-08 LAB — HCG, QUANTITATIVE, PREGNANCY: hCG, Beta Chain, Quant, S: <1 m[IU]/mL (ref <5)

## 2021-03-08 MED ORDER — VENLAFAXINE HCL ER 150 MG PO CP24: 300.0000 mg | ORAL_CAPSULE | Freq: Every day | ORAL | 0 refills | Status: AC

## 2021-03-08 MED ORDER — HYDROXYZINE HCL 25 MG PO TABS: 25.0000 mg | ORAL_TABLET | Freq: Four times a day (QID) | ORAL | 0 refills | Status: AC | PRN

## 2021-03-08 MED ORDER — ETODOLAC 400 MG PO TABS: 400.0000 mg | ORAL_TABLET | Freq: Two times a day (BID) | ORAL | 0 refills | Status: AC

## 2021-03-08 MED ORDER — LISDEXAMFETAMINE DIMESYLATE 50 MG PO CAPS: 50.0000 mg | ORAL_CAPSULE | Freq: Every day | ORAL | 0 refills | Status: AC

## 2021-03-08 MED ORDER — LORAZEPAM 0.5 MG PO TABS
0.5000 mg | ORAL_TABLET | Freq: Once | ORAL | Status: AC
  Administered 2021-03-08: 0.5 mg via ORAL
  Filled 2021-03-08: qty 1

## 2021-03-08 MED ORDER — IOHEXOL 350 MG/ML SOLN
75.0000 mL | Freq: Once | INTRAVENOUS | Status: AC | PRN
  Administered 2021-03-08: 75 mL via INTRAVENOUS
  Filled 2021-03-08: qty 75

## 2021-03-08 MED ORDER — ETODOLAC 200 MG PO CAPS
200.0000 mg | ORAL_CAPSULE | Freq: Once | ORAL | Status: AC
  Administered 2021-03-08: 200 mg via ORAL
  Filled 2021-03-08: qty 1

## 2021-03-08 MED ORDER — GABAPENTIN 300 MG PO CAPS: 900.0000 mg | ORAL_CAPSULE | Freq: Three times a day (TID) | ORAL | 0 refills | Status: AC

## 2021-03-08 MED ORDER — MIRTAZAPINE 15 MG PO TABS: 15.0000 mg | ORAL_TABLET | Freq: Every day | ORAL | 0 refills | Status: AC

## 2021-03-08 MED ORDER — LORAZEPAM 1 MG PO TABS: 1.0000 mg | ORAL_TABLET | Freq: Two times a day (BID) | ORAL | 0 refills | Status: AC | PRN

## 2021-03-08 MED ORDER — METHYLPHENIDATE HCL 20 MG PO TABS: 20.0000 mg | ORAL_TABLET | Freq: Every day | ORAL | 0 refills | Status: AC
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
## 2021-03-08 NOTE — ED Notes (Signed)
Patient transported to CT 

## 2021-03-08 NOTE — ED Provider Notes (Signed)
  Physical Exam  BP (!) 136/105   Pulse 80   Temp 98.9 F (37.2 C) (Oral)   Resp 18   Ht 5\' 8"  (1.727 m)   Wt 90.7 kg   SpO2 98%   BMI 30.41 kg/m   Physical Exam  ED Course/Procedures     Procedures  MDM  Assumed patient care from , MD.  CTA of the neck was reassuring.  Patient was discharged.       Donna Bernard Quail, [place] 03/08/21 2234    2235, MD 03/12/21 [phone]

## 2022-06-03 IMAGING — CR DG CHEST 2V
1 series · 2 of 2 positions shown · non-contrast
Comparison: 06/28/2019

CLINICAL DATA: Fatigue and myalgias.

EXAM:
CHEST - 2 VIEW

[Series 1: dg chest 2 view · 0.14mm/px · 2 of 2 slices shown]
[im 1/2]
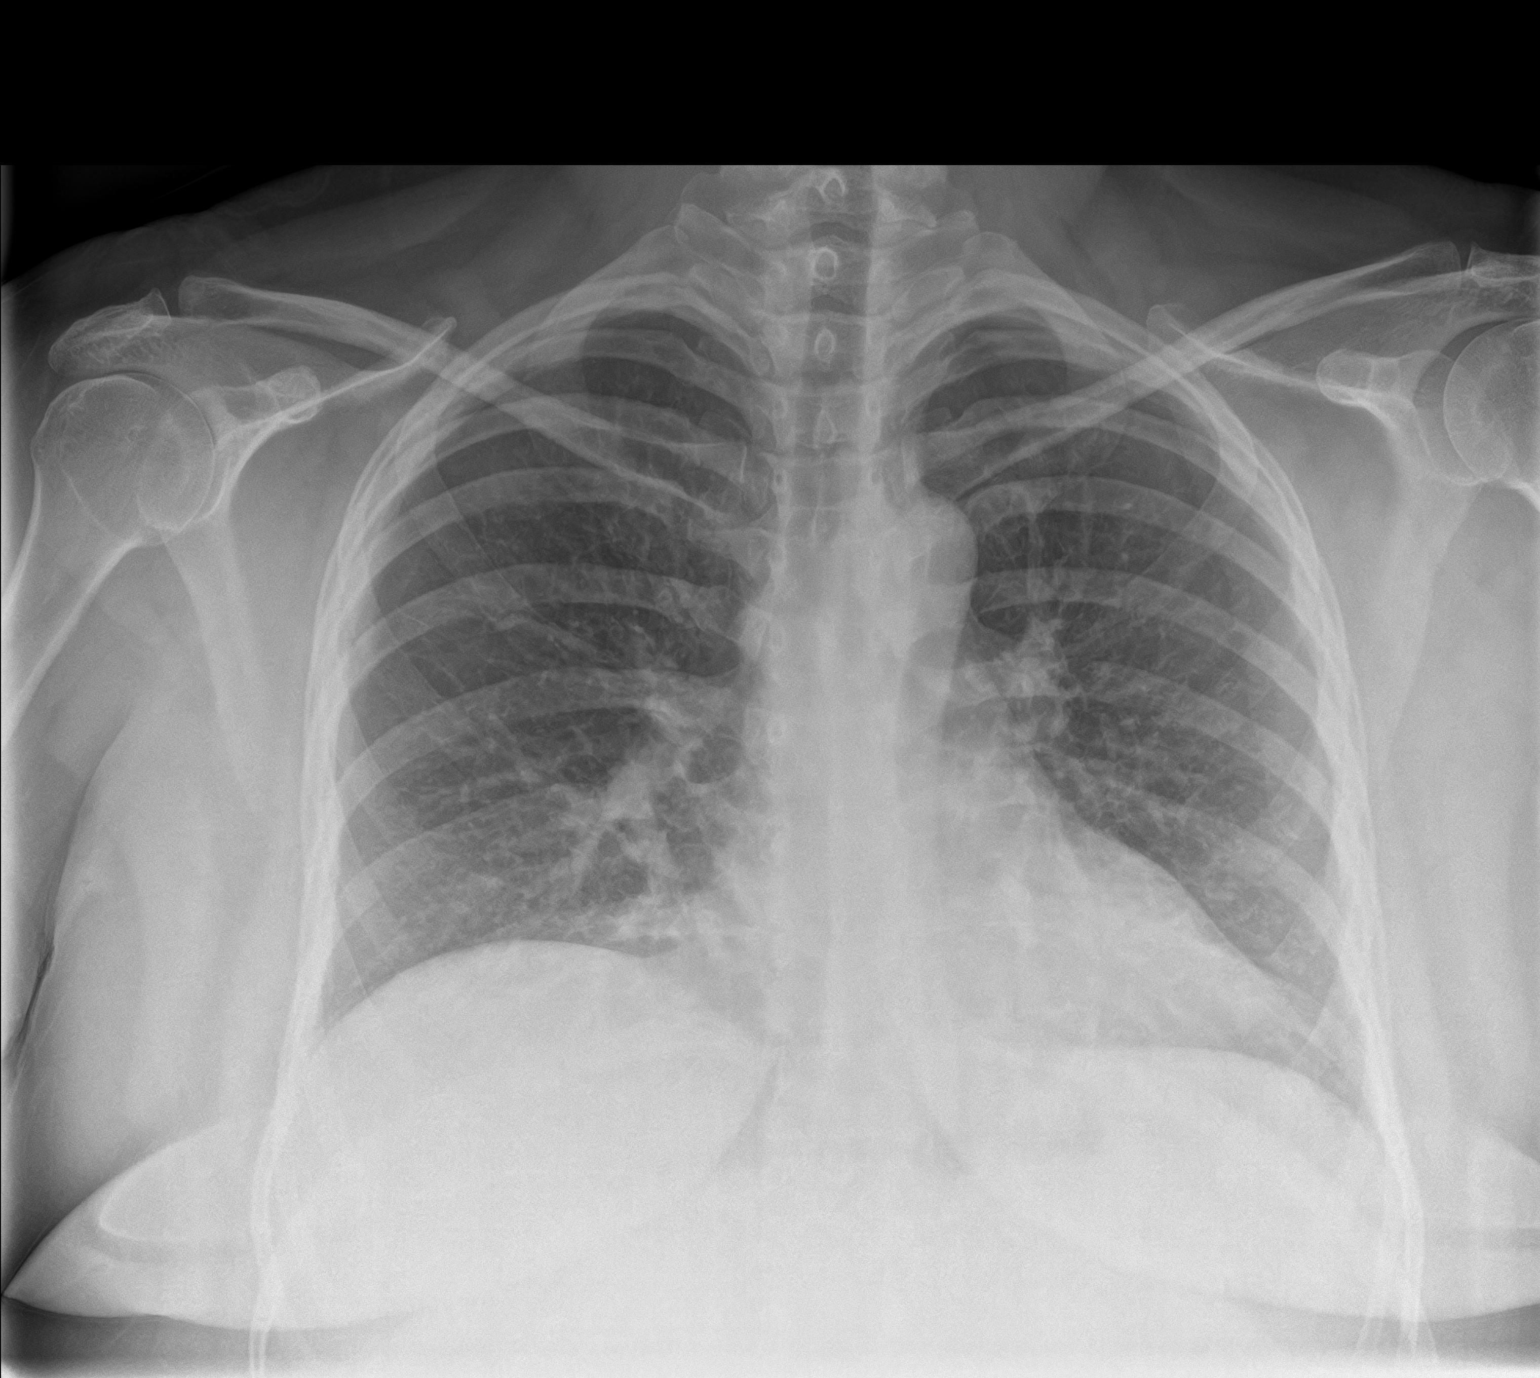
[im 2/2]
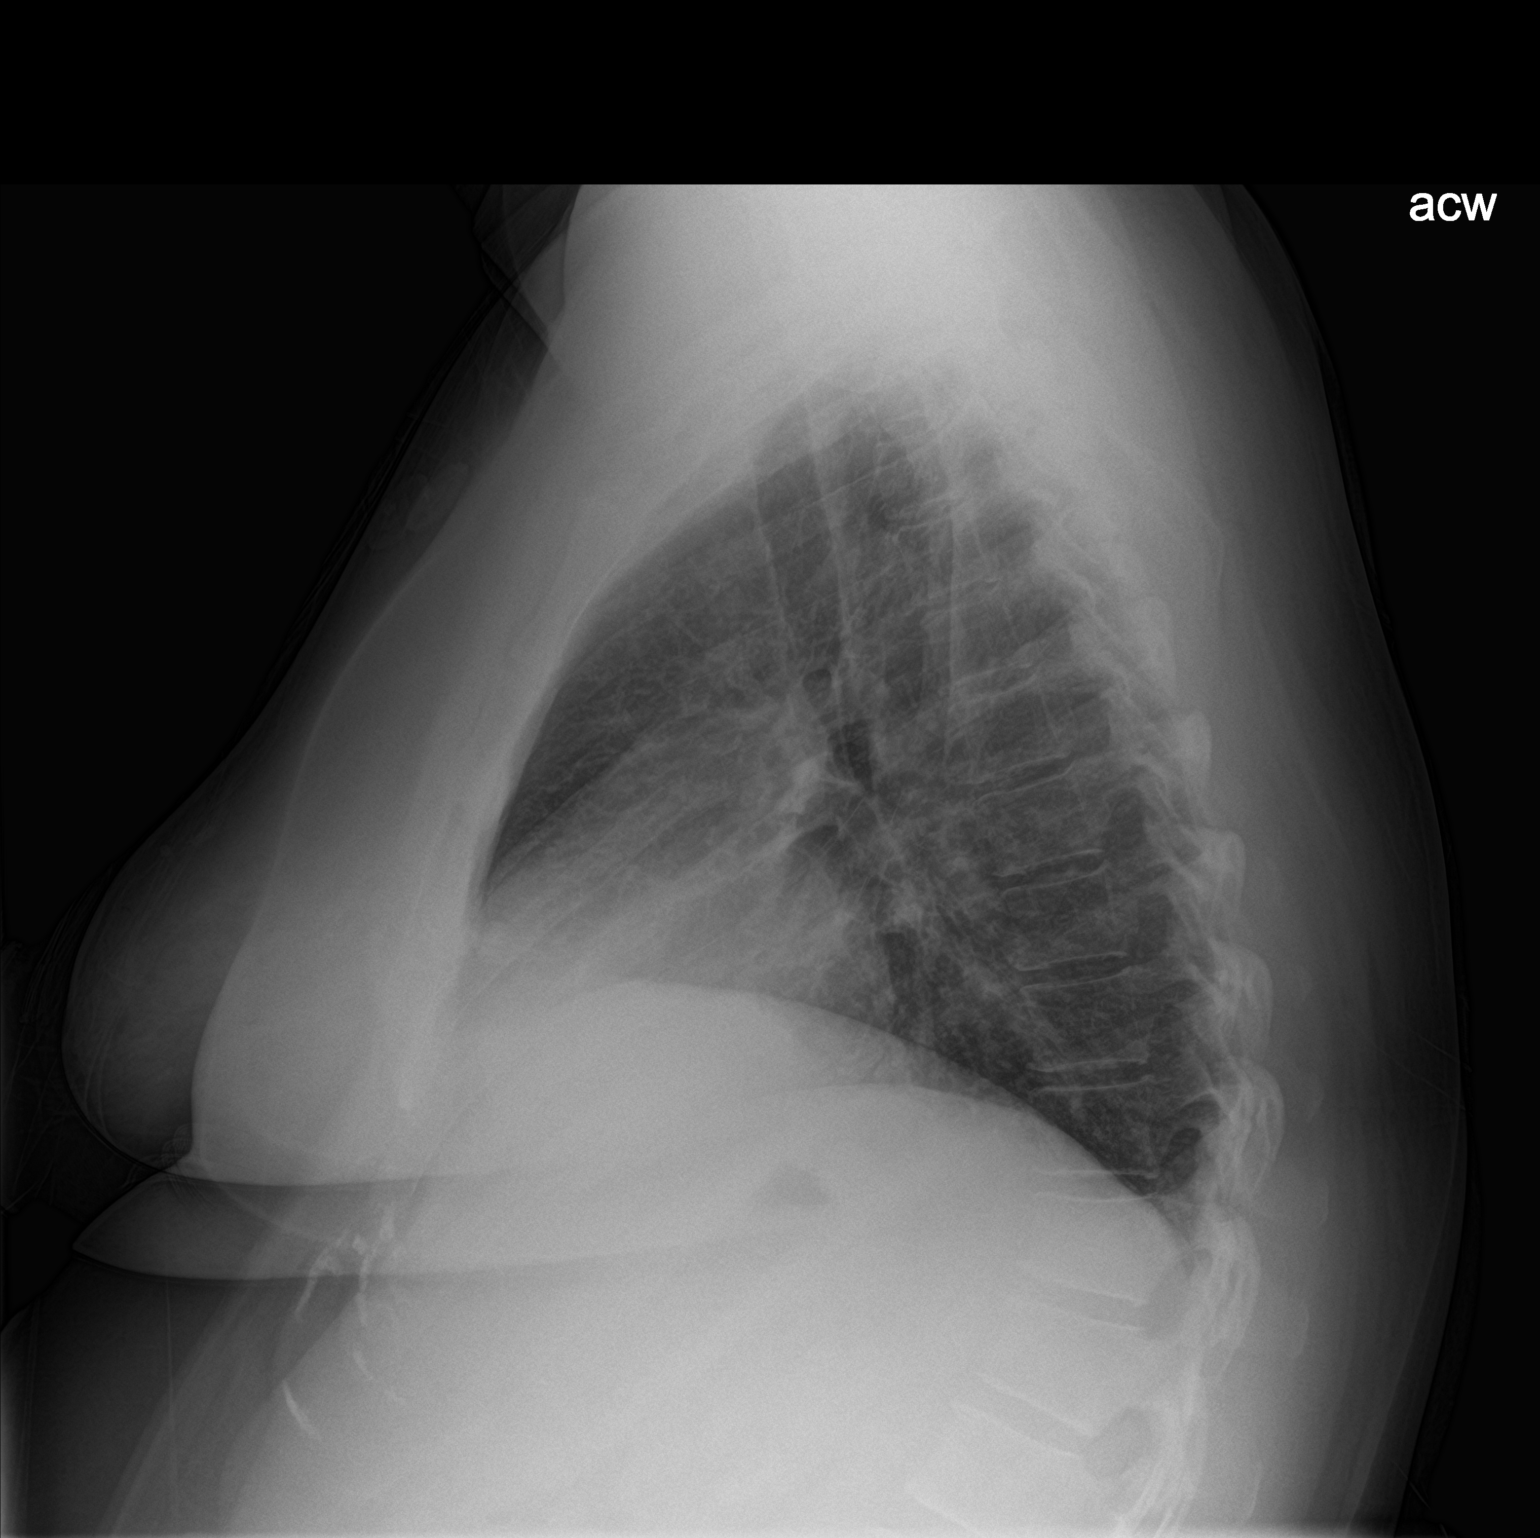

[2 of 2 positions shown; findings below may reference images not displayed]

FINDINGS: The cardiomediastinal contours are normal. Minor right infrahilar
atelectasis. Pulmonary vasculature is normal. No consolidation,
pleural effusion, or pneumothorax. No acute osseous abnormalities
are seen.
IMPRESSION: Minor right infrahilar atelectasis.  No evidence of pneumonia.
